# Patient Record
Sex: Female | Born: 1935 | Race: White | Hispanic: No | Smoking: Former smoker
Health system: Southern US, Community
[De-identification: ages and names within clinical notes are randomized; demographics above are authoritative.]

## PROBLEM LIST (undated history)

## (undated) DIAGNOSIS — K219 Gastro-esophageal reflux disease without esophagitis: Secondary | ICD-10-CM

## (undated) DIAGNOSIS — I10 Essential (primary) hypertension: Secondary | ICD-10-CM

## (undated) DIAGNOSIS — J449 Chronic obstructive pulmonary disease, unspecified: Secondary | ICD-10-CM

## (undated) HISTORY — PX: ESOPHAGOGASTRODUODENOSCOPY: SHX1529

## (undated) HISTORY — PX: COLONOSCOPY: SHX174

---

## 2016-05-14 ENCOUNTER — Encounter (HOSPITAL_COMMUNITY): Payer: Self-pay | Admitting: Emergency Medicine

## 2016-05-14 ENCOUNTER — Emergency Department (HOSPITAL_COMMUNITY)
Admission: EM | Admit: 2016-05-14 | Discharge: 2016-05-14 | Disposition: A | Discharge status: Home / Self Care | Payer: Medicare Other | Attending: Emergency Medicine | Admitting: Emergency Medicine

## 2016-05-14 ENCOUNTER — Emergency Department (HOSPITAL_COMMUNITY): Payer: Medicare Other

## 2016-05-14 DIAGNOSIS — I10 Essential (primary) hypertension: Secondary | ICD-10-CM | POA: Insufficient documentation

## 2016-05-14 DIAGNOSIS — S0012XA Contusion of left eyelid and periocular area, initial encounter: Secondary | ICD-10-CM | POA: Insufficient documentation

## 2016-05-14 DIAGNOSIS — Y9384 Activity, sleeping: Secondary | ICD-10-CM | POA: Diagnosis not present

## 2016-05-14 DIAGNOSIS — R51 Headache: Secondary | ICD-10-CM | POA: Diagnosis not present

## 2016-05-14 DIAGNOSIS — F17210 Nicotine dependence, cigarettes, uncomplicated: Secondary | ICD-10-CM | POA: Diagnosis not present

## 2016-05-14 DIAGNOSIS — H05232 Hemorrhage of left orbit: Secondary | ICD-10-CM

## 2016-05-14 DIAGNOSIS — Y9289 Other specified places as the place of occurrence of the external cause: Secondary | ICD-10-CM | POA: Diagnosis not present

## 2016-05-14 DIAGNOSIS — W228XXA Striking against or struck by other objects, initial encounter: Secondary | ICD-10-CM | POA: Diagnosis not present

## 2016-05-14 DIAGNOSIS — W1812XA Fall from or off toilet with subsequent striking against object, initial encounter: Secondary | ICD-10-CM

## 2016-05-14 DIAGNOSIS — Y999 Unspecified external cause status: Secondary | ICD-10-CM | POA: Insufficient documentation

## 2016-05-14 DIAGNOSIS — S0592XA Unspecified injury of left eye and orbit, initial encounter: Secondary | ICD-10-CM | POA: Diagnosis present

## 2016-05-14 LAB — CBC
HEMATOCRIT: 39 % (ref 36.0–46.0)
Hemoglobin: 13.1 g/dL (ref 12.0–15.0)
MCH: 30.5 pg (ref 26.0–34.0)
MCHC: 33.6 g/dL (ref 30.0–36.0)
MCV: 90.9 fL (ref 78.0–100.0)
PLATELETS: 344 10*3/uL (ref 150–400)
RBC: 4.29 MIL/uL (ref 3.87–5.11)
RDW: 12.9 % (ref 11.5–15.5)
WBC: 10.5 10*3/uL (ref 4.0–10.5)

## 2016-05-14 LAB — URINALYSIS, ROUTINE W REFLEX MICROSCOPIC
BILIRUBIN URINE: NEGATIVE
Glucose, UA: NEGATIVE mg/dL
Hgb urine dipstick: NEGATIVE
KETONES UR: NEGATIVE mg/dL
LEUKOCYTES UA: NEGATIVE
NITRITE: NEGATIVE
PH: 7 (ref 5.0–8.0)
PROTEIN: NEGATIVE mg/dL
Specific Gravity, Urine: 1.005 (ref 1.005–1.030)

## 2016-05-14 LAB — BASIC METABOLIC PANEL
Anion gap: 11 (ref 5–15)
BUN: 15 mg/dL (ref 6–20)
CALCIUM: 9.5 mg/dL (ref 8.9–10.3)
CO2: 28 mmol/L (ref 22–32)
CREATININE: 0.77 mg/dL (ref 0.44–1.00)
Chloride: 95 mmol/L — ABNORMAL LOW (ref 101–111)
GFR calc Af Amer: >60 mL/min (ref >60)
GLUCOSE: 95 mg/dL (ref 65–99)
POTASSIUM: 4.1 mmol/L (ref 3.5–5.1)
SODIUM: 134 mmol/L — AB (ref 135–145)

## 2016-05-14 MED ORDER — FENTANYL CITRATE (PF) 100 MCG/2ML IJ SOLN
25.0000 ug | Freq: Once | INTRAMUSCULAR | Status: AC
  Administered 2016-05-14: 25 ug via INTRAVENOUS
  Filled 2016-05-14: qty 2

## 2016-05-14 NOTE — ED Provider Notes (Signed)
[facility]PT Provider Note   CSN: [id number] Arrival date & time: 05/14/16  1212     History   Chief Complaint Chief Complaint  Patient presents with  . Fall  . Head Injury    HPI Amy Mckay is a 81 y.o. female.  HPI Patient with PMH of chronic back pain, peripheral vascular disease, HTN, HLD who presents for evaluation of head injury.  Patient states around 2 AM she accidentally fell asleep on the toilet and fell forward striking her head on a shelf.  She was able to get up and walk back to the bed and go to sleep.  She woke up this morning with a headache which prompted her visit.  She has a large bruise to her left eye. She denies any visual changes, numbness, weakness, gait abnormalities, or neck pain.  She denies any preceding dizziness, syncope, CP, or SOB.  She took her regular home medications, but has not taken anything for pain.  She endorses back pain, but this is a chronic issue for her and is unchanged today.   No past medical history on file.  There are no active problems to display for this patient.   No past surgical history on file.  OB History    No data available       Home Medications    Prior to Admission medications   Not on File    Family History No family history on file.  Social History Social History  Substance Use Topics  . Smoking status: Current Every Day Smoker    Packs/day: 1.50    Types: Cigarettes  . Smokeless tobacco: Never Used  . Alcohol use No     Allergies   Penicillins   Review of Systems Review of Systems All other systems negative unless otherwise stated in HPI   Physical Exam Updated Vital Signs BP 133/70 (BP Location: Right Arm)   Pulse 86   Temp 98.2 F (36.8 C) (Oral)   Resp 11   SpO2 96%   Physical Exam  Constitutional: She is oriented to person, place, and time. She appears well-developed and well-nourished.  Non-toxic appearance. She does not have a sickly appearance. She does not  appear ill.  HENT:  Head: Normocephalic.  Mouth/Throat: Oropharynx is clear and moist.  Golf ball sized hematoma to left superolateral orbit without underlying crepitus.  Eyes: Conjunctivae and EOM are normal. Pupils are equal, round, and reactive to light.  No hyphema.   Neck: Normal range of motion. Neck supple.  No cervical midline tenderness.   Cardiovascular: Normal rate and regular rhythm.   Pulmonary/Chest: Effort normal and breath sounds normal. No accessory muscle usage or stridor. No respiratory distress. She has no wheezes. She has no rhonchi. She has no rales.  Abdominal: Soft. Bowel sounds are normal. She exhibits no distension. There is no tenderness.  Musculoskeletal: Normal range of motion.  No T/L spine tenderness.   Lymphadenopathy:    She has no cervical adenopathy.  Neurological: She is alert and oriented to person, place, and time.  Speech clear without dysarthria. Cranial nerves grossly intact.  No pronator drift.  Normal finger to nose.  Normal strength and sensation throughout bilateral upper and lower extremities.  Normal gait.   Skin: Skin is warm and dry.  Psychiatric: She has a normal mood and affect. Her behavior is normal.     ED Treatments / Results  Labs (all labs ordered are listed, but only abnormal results are displayed) Labs Reviewed  BASIC METABOLIC PANEL - Abnormal; Notable for the following:       Result Value   Sodium 134 (*)    Chloride 95 (*)    All other components within normal limits  CBC  URINALYSIS, ROUTINE W REFLEX MICROSCOPIC [facility])    EKG  EKG Interpretation None       Radiology Ct Head Wo Contrast  Result Date: 05/14/2016 CLINICAL DATA:  Head injury on blood thinners.  Initial encounter. EXAM: CT HEAD WITHOUT CONTRAST TECHNIQUE: Contiguous axial images were obtained from the base of the skull through the vertex without intravenous contrast. COMPARISON:  None. FINDINGS: Brain: No evidence of acute infarction,  hemorrhage, hydrocephalus, extra-axial collection or mass lesion/mass effect. 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 Vascular: Atherosclerotic calcification.  No hyperdense vessel. Skull: Hematoma lateral to the left orbit.  No underlying fracture. Sinuses/Orbits: Bilateral cataract resection. No postseptal edema or hemorrhage. IMPRESSION: 1. No evidence of intracranial injury. 2. Left periorbital hematoma without fracture. Electronically Signed   By: Marnee SpringJonathon  Watts M.D.   On: 05/14/2016 13:04    Procedures Procedures (including critical care time)  Medications Ordered in ED Medications  fentaNYL (SUBLIMAZE) injection 25 mcg (25 mcg Intravenous Given 05/14/16 1350)     Initial Impression / Assessment and Plan / ED Course  I have reviewed the triage vital signs and the nursing notes.  Pertinent labs & imaging results that were available during my care of the patient were reviewed by me and considered in my medical decision making (see chart for details).  Clinical Course    Patient with left periorbital hematoma without underlying fracture.  No other signs of trauma.  No cervical spine tenderness.  She has no focal neurological deficits and able to ambulate in the ED without difficulty.  Labs obtained in triage without acute abnormalities.  Patient has pain medication at home.  Discussed ice and head elevation.  Follow up with PCP.  Return precautions discussed. All questions answered.  Stable for discharge.   Final Clinical Impressions(s) / ED Diagnoses   Final diagnoses:  Fall from or off toilet w strike against object, init  Periorbital hematoma of left eye    New Prescriptions New Prescriptions   No medications on file     Cheri FowlerKayla Dann Galicia, PA-C 05/14/16 1443    Jacalyn LefevreJulie Haviland, MD 05/14/16 1515

## 2016-05-14 NOTE — Discharge Instructions (Signed)
Ice your injury for 10-20 minutes three to four times daily to help with pain and swelling.  Continue taking your prescribed medications.  Follow up with your primary care physician in about a week.  Return to the ED for sudden worsening pain, confusion, numbness, weakness, or any new or concerning symptoms.

## 2016-05-14 NOTE — ED Triage Notes (Signed)
Pt from home with c/o a head injury s/p falling asleep while sitting on the toilet causing her to fall and hit her head.  Pt reports she is on blood thinners.  NAD, A&O.

## 2016-06-14 ENCOUNTER — Emergency Department (HOSPITAL_COMMUNITY)
Admission: EM | Admit: 2016-06-14 | Discharge: 2016-06-14 | Disposition: A | Discharge status: Home / Self Care | Payer: Medicare Other | Attending: Physician Assistant | Admitting: Physician Assistant

## 2016-06-14 ENCOUNTER — Emergency Department (HOSPITAL_COMMUNITY): Payer: Medicare Other

## 2016-06-14 ENCOUNTER — Encounter (HOSPITAL_COMMUNITY): Payer: Self-pay | Admitting: Emergency Medicine

## 2016-06-14 DIAGNOSIS — Y9351 Activity, roller skating (inline) and skateboarding: Secondary | ICD-10-CM | POA: Diagnosis not present

## 2016-06-14 DIAGNOSIS — Y92009 Unspecified place in unspecified non-institutional (private) residence as the place of occurrence of the external cause: Secondary | ICD-10-CM | POA: Insufficient documentation

## 2016-06-14 DIAGNOSIS — F17210 Nicotine dependence, cigarettes, uncomplicated: Secondary | ICD-10-CM | POA: Diagnosis not present

## 2016-06-14 DIAGNOSIS — Y999 Unspecified external cause status: Secondary | ICD-10-CM | POA: Diagnosis not present

## 2016-06-14 DIAGNOSIS — T1490XA Injury, unspecified, initial encounter: Secondary | ICD-10-CM

## 2016-06-14 DIAGNOSIS — M545 Low back pain: Secondary | ICD-10-CM | POA: Insufficient documentation

## 2016-06-14 DIAGNOSIS — S0101XA Laceration without foreign body of scalp, initial encounter: Secondary | ICD-10-CM | POA: Insufficient documentation

## 2016-06-14 DIAGNOSIS — S0990XA Unspecified injury of head, initial encounter: Secondary | ICD-10-CM | POA: Diagnosis present

## 2016-06-14 DIAGNOSIS — Z7982 Long term (current) use of aspirin: Secondary | ICD-10-CM | POA: Diagnosis not present

## 2016-06-14 DIAGNOSIS — W19XXXA Unspecified fall, initial encounter: Secondary | ICD-10-CM

## 2016-06-14 LAB — CBC WITH DIFFERENTIAL/PLATELET
Basophils Absolute: 0 10*3/uL (ref 0.0–0.1)
Basophils Relative: 0 %
EOS ABS: 0.2 10*3/uL (ref 0.0–0.7)
EOS PCT: 2 %
HCT: 40.6 % (ref 36.0–46.0)
HEMOGLOBIN: 13.9 g/dL (ref 12.0–15.0)
LYMPHS ABS: 3.7 10*3/uL (ref 0.7–4.0)
Lymphocytes Relative: 31 %
MCH: 30.3 pg (ref 26.0–34.0)
MCHC: 34.2 g/dL (ref 30.0–36.0)
MCV: 88.5 fL (ref 78.0–100.0)
MONOS PCT: 9 %
Monocytes Absolute: 1.1 10*3/uL — ABNORMAL HIGH (ref 0.1–1.0)
Neutro Abs: 6.9 10*3/uL (ref 1.7–7.7)
Neutrophils Relative %: 58 %
PLATELETS: 380 10*3/uL (ref 150–400)
RBC: 4.59 MIL/uL (ref 3.87–5.11)
RDW: 13 % (ref 11.5–15.5)
WBC: 11.9 10*3/uL — ABNORMAL HIGH (ref 4.0–10.5)

## 2016-06-14 LAB — BASIC METABOLIC PANEL
Anion gap: 12 (ref 5–15)
BUN: 13 mg/dL (ref 6–20)
CHLORIDE: 98 mmol/L — AB (ref 101–111)
CO2: 23 mmol/L (ref 22–32)
CREATININE: 0.8 mg/dL (ref 0.44–1.00)
Calcium: 9.4 mg/dL (ref 8.9–10.3)
GFR calc Af Amer: >60 mL/min (ref >60)
GFR calc non Af Amer: >60 mL/min (ref >60)
GLUCOSE: 100 mg/dL — AB (ref 65–99)
Potassium: 3.6 mmol/L (ref 3.5–5.1)
SODIUM: 133 mmol/L — AB (ref 135–145)

## 2016-06-14 MED ORDER — MORPHINE SULFATE (PF) 4 MG/ML IV SOLN
4.0000 mg | Freq: Once | INTRAVENOUS | Status: AC
  Administered 2016-06-14: 4 mg via INTRAVENOUS
  Filled 2016-06-14: qty 1

## 2016-06-14 MED ORDER — FENTANYL CITRATE (PF) 100 MCG/2ML IJ SOLN
25.0000 ug | Freq: Once | INTRAMUSCULAR | Status: AC
  Administered 2016-06-14: 25 ug via INTRAVENOUS
  Filled 2016-06-14: qty 2

## 2016-06-14 MED ORDER — HYDROCODONE-ACETAMINOPHEN 5-325 MG PO TABS
1.0000 | ORAL_TABLET | Freq: Once | ORAL | Status: AC
  Administered 2016-06-14: 1 via ORAL
  Filled 2016-06-14: qty 1

## 2016-06-14 MED ORDER — LIDOCAINE HCL (PF) 1 % IJ SOLN
5.0000 mL | Freq: Once | INTRAMUSCULAR | Status: AC
Start: 2016-06-14 — End: 2016-06-14
  Administered 2016-06-14: 5 mL via INTRADERMAL
  Filled 2016-06-14: qty 5

## 2016-06-14 NOTE — ED Notes (Signed)
C collar placed on pt by Ronaldo MiyamotoKyle, emt. Pt rolled and backboard removed by Dr. Juliann ParesMackeun

## 2016-06-14 NOTE — ED Triage Notes (Signed)
Per [facility], pt from home, was on a hoverboard fell backwards onto back of head straight onto hardwood floor, pt states she heard a crack. Pt denies LOC. No neuro deficits. Pt on plavix states she has two stents in her abdomen. Right occipital injury. Pt in NAD. Denies neck pain. Pt arrived on back board.

## 2016-06-14 NOTE — Progress Notes (Signed)
PARTIAL DENTAL WORK, HOUSE KEY, HAIR PINS, MONEY AND PAPERWORK REMOVED FROM PATIENT POCKETS AND HEAD, THEN PLACED IN TWO DENTURE CUPS FOR SAFE-KEEPING PRIOR TO SCAN. PATIENT'S HEAD BANDAGE RE-WRAPPED WITH GAUZE AND COBAN DUE TO ORIGINAL GAUZE NOT STILL IN PLACE UPON PATIENT ARRIVAL. (JDR)

## 2016-06-14 NOTE — ED Notes (Signed)
Pt returned from CT °

## 2016-06-14 NOTE — Discharge Instructions (Signed)
Please return to your doctor, urgent care, or this department in 5-7 days for staple removal.

## 2016-06-14 NOTE — ED Notes (Signed)
Pt transported to CT ?

## 2016-06-14 NOTE — ED Triage Notes (Signed)
196/108 BP by ems, pulse 112. spo2 96%, pt given 50 of fentanyl per ems.

## 2016-06-14 NOTE — ED Notes (Signed)
Dressing applied to head.

## 2016-06-14 NOTE — ED Provider Notes (Signed)
[facility] Provider Note   CSN: [id number] Arrival date & time: 06/14/16  1538     History   Chief Complaint Chief Complaint  Patient presents with  . Fall  . Head Injury    HPI Amy Mckay is a 81 y.o. female.  The history is provided by the patient and the EMS personnel. No language interpreter was used.  Fall  This is a new problem. The current episode started less than 1 hour ago. Associated symptoms include headaches. Pertinent negatives include no abdominal pain and no shortness of breath.  Head Injury   The incident occurred less than 1 hour ago. She came to the ER via EMS. The injury mechanism was a fall. There was no loss of consciousness. The volume of blood lost was minimal. The pain is moderate. Pertinent negatives include no numbness, no blurred vision, no vomiting, no disorientation and no weakness. She was found conscious by EMS personnel. Treatment on the scene included a backboard and a c-collar. She has tried prescription drugs for the symptoms.     81 year old female with past medical history of abdominal vascular stents currently on Plavix who presents today with fall immediately prior to arrival. States she was on a hover board when she slipped and fell backwards hitting the back of her head. States she heard a crack sound. Endorses pain in the back of her head and in her lower back. Denies LOC. Laid there until EMS arrived and has not tried to ambulate. EMS notes she had a laceration with bleeding to the back of her head. Bleeding has been controlled. Denies any new numbness/tingling, focal weakness. Denies being on anticoagulation.   History reviewed. No pertinent past medical history.  There are no active problems to display for this patient.   History reviewed. No pertinent surgical history.  OB History    No data available       Home Medications    Prior to Admission medications   Medication Sig Start Date End Date Taking? Authorizing  Provider  albuterol (PROVENTIL HFA;VENTOLIN HFA) 108 (90 Base) MCG/ACT inhaler Inhale 2 puffs into the lungs daily as needed for shortness of breath. 06/27/15  Yes Historical Provider, MD  amLODipine (NORVASC) 10 MG tablet Take 10 mg by mouth daily. 05/27/16  Yes Historical Provider, MD  aspirin EC 81 MG tablet Take 81 mg by mouth daily.   Yes Historical Provider, MD  calcium carbonate (CALCIUM 600) 600 MG TABS tablet Take 600 mg by mouth daily.   Yes Historical Provider, MD  clopidogrel (PLAVIX) 75 MG tablet Take 75 mg by mouth daily. 06/04/16  Yes Historical Provider, MD  cyclobenzaprine (FLEXERIL) 5 MG tablet Take 5 mg by mouth 3 (three) times daily as needed. 04/25/16  Yes Historical Provider, MD  fluticasone (FLONASE) 50 MCG/ACT nasal spray Place 1 spray into both nostrils daily. 05/14/16  Yes Historical Provider, MD  gabapentin (NEURONTIN) 800 MG tablet Take 800 mg by mouth 3 (three) times daily. 04/25/16  Yes Historical Provider, MD  levothyroxine (SYNTHROID, LEVOTHROID) 75 MCG tablet Take 75 mcg by mouth daily before breakfast. 02/05/16  Yes Historical Provider, MD  metoCLOPramide (REGLAN) 5 MG tablet Take 5 mg by mouth daily as needed for nausea. 04/18/14  Yes Historical Provider, MD  Oxycodone HCl 10 MG TABS Take 10 mg by mouth 4 (four) times daily as needed for pain. 06/10/16 07/10/16 Yes Historical Provider, MD  pantoprazole (PROTONIX) 40 MG tablet Take 40 mg by mouth daily. 01/23/16  Yes  Historical Provider, MD  VESICARE 5 MG tablet Take 5 mg by mouth daily. 04/25/16  Yes Historical Provider, MD  Vitamin D, Cholecalciferol, 400 units TABS Take 400 Units by mouth daily.    Yes Historical Provider, MD    Family History No family history on file.  Social History Social History  Substance Use Topics  . Smoking status: Current Every Day Smoker    Packs/day: 1.50    Types: Cigarettes  . Smokeless tobacco: Never Used  . Alcohol use No     Allergies   Penicillins   Review of  Systems Review of Systems  HENT: Negative for facial swelling and nosebleeds.   Eyes: Negative for blurred vision.  Respiratory: Negative for shortness of breath.   Gastrointestinal: Negative for abdominal pain, nausea and vomiting.  Genitourinary: Negative for dysuria and hematuria.  Skin: Positive for wound (back of head).  Neurological: Positive for headaches. Negative for weakness and numbness.  Psychiatric/Behavioral: Negative for agitation and confusion.  All other systems reviewed and are negative.    Physical Exam Updated Vital Signs BP 108/74   Pulse 100   Temp 98.8 F (37.1 C) (Oral)   Resp 16   SpO2 100%   Physical Exam  Constitutional: She is oriented to person, place, and time. No distress. Cervical collar and backboard in place.  Elderly appearing  HENT:  Head: Head is with laceration (posterior scalp).  Laceration to posterior scalp with palpable deformity. Laceration is approximately 1 cm with underlying hematoma. Oozing of blood noted from laceration.  Eyes: Conjunctivae and EOM are normal. Pupils are equal, round, and reactive to light.  Neck: Normal range of motion. Neck supple.  No midline C spine ttp  Cardiovascular: Normal rate and regular rhythm.   Pulmonary/Chest: Effort normal and breath sounds normal. No respiratory distress. She has no wheezes.  Abdominal: Soft. She exhibits no distension. There is no tenderness.  Musculoskeletal:  Tenderness to lower lumbar spine  Neurological: She is alert and oriented to person, place, and time.  Skin: Skin is warm. She is not diaphoretic.  Nursing note and vitals reviewed.    ED Treatments / Results  Labs (all labs ordered are listed, but only abnormal results are displayed) Labs Reviewed  CBC WITH DIFFERENTIAL/PLATELET - Abnormal; Notable for the following:       Result Value   WBC 11.9 (*)    Monocytes Absolute 1.1 (*)    All other components within normal limits  BASIC METABOLIC PANEL - Abnormal;  Notable for the following:    Sodium 133 (*)    Chloride 98 (*)    Glucose, Bld 100 (*)    All other components within normal limits    EKG  EKG Interpretation None       Radiology Ct Head Wo Contrast  Result Date: 06/14/2016 CLINICAL DATA:  Patient status post fall from however board. No reported loss of consciousness. EXAM: CT HEAD WITHOUT CONTRAST CT CERVICAL SPINE WITHOUT CONTRAST TECHNIQUE: Multidetector CT imaging of the head and cervical spine was performed following the standard protocol without intravenous contrast. Multiplanar CT image reconstructions of the cervical spine were also generated. COMPARISON:  None. FINDINGS: CT HEAD FINDINGS Brain: No evidence of acute infarction, hemorrhage, hydrocephalus, extra-axial collection or mass lesion/mass effect. Suggest chronic right thalamic infarct. Vascular: Atherosclerotic disease. Skull: Soft tissue laceration/hematoma overlying the dorsal calvarium. No evidence for underlying skull fracture. Sinuses/Orbits: Paranasal sinuses are unremarkable. Mastoid air cells are well aerated. Orbits are unremarkable. None. Other: None.  CT CERVICAL SPINE FINDINGS Alignment: Straightening of the normal cervical lordosis. Mild grade 1 anterolisthesis of C3 on C4 and C4 on C5, likely secondary to associated facet degenerative changes. Skull base and vertebrae: Intact. Soft tissues and spinal canal: No prevertebral fluid or swelling. No visible canal hematoma. Disc levels:  Degenerative disc disease most pronounced C6-7. Upper chest: Unremarkable. Other: None. IMPRESSION: Soft tissue laceration overlying the dorsal calvarium. No evidence for underlying skull fracture. No acute intracranial process. No acute cervical spine fracture. Electronically Signed   By: Annia Beltrew  Davis M.D.   On: 06/14/2016 17:18   Ct Cervical Spine Wo Contrast  Result Date: 06/14/2016 CLINICAL DATA:  Patient status post fall from however board. No reported loss of consciousness. EXAM:  CT HEAD WITHOUT CONTRAST CT CERVICAL SPINE WITHOUT CONTRAST TECHNIQUE: Multidetector CT imaging of the head and cervical spine was performed following the standard protocol without intravenous contrast. Multiplanar CT image reconstructions of the cervical spine were also generated. COMPARISON:  None. FINDINGS: CT HEAD FINDINGS Brain: No evidence of acute infarction, hemorrhage, hydrocephalus, extra-axial collection or mass lesion/mass effect. Suggest chronic right thalamic infarct. Vascular: Atherosclerotic disease. Skull: Soft tissue laceration/hematoma overlying the dorsal calvarium. No evidence for underlying skull fracture. Sinuses/Orbits: Paranasal sinuses are unremarkable. Mastoid air cells are well aerated. Orbits are unremarkable. None. Other: None. CT CERVICAL SPINE FINDINGS Alignment: Straightening of the normal cervical lordosis. Mild grade 1 anterolisthesis of C3 on C4 and C4 on C5, likely secondary to associated facet degenerative changes. Skull base and vertebrae: Intact. Soft tissues and spinal canal: No prevertebral fluid or swelling. No visible canal hematoma. Disc levels:  Degenerative disc disease most pronounced C6-7. Upper chest: Unremarkable. Other: None. IMPRESSION: Soft tissue laceration overlying the dorsal calvarium. No evidence for underlying skull fracture. No acute intracranial process. No acute cervical spine fracture. Electronically Signed   By: Annia Beltrew  Davis M.D.   On: 06/14/2016 17:18   Ct Thoracic Spine Wo Contrast  Result Date: 06/14/2016 CLINICAL DATA:  Fall backwards today.  Back pain. EXAM: CT THORACIC AND LUMBAR SPINE WITHOUT CONTRAST TECHNIQUE: Multidetector CT imaging of the thoracic and lumbar spine was performed without contrast. Multiplanar CT image reconstructions were also generated. COMPARISON:  None. FINDINGS: CT THORACIC SPINE FINDINGS Alignment: Mild rightward scoliosis in the upper thoracic spine. Leftward scoliosis at the thoracolumbar junction. Vertebrae: No  acute fracture or focal pathologic process. Paraspinal and other soft tissues: Negative. No confluent airspace opacity in the visualized lungs. Aortic calcifications. No aneurysm. Disc levels: Degenerative disc disease changes in the upper lumbar spine. CT LUMBAR SPINE FINDINGS Segmentation: 5 lumbar type vertebrae. Alignment: Mild leftward scoliosis in the upper lumbar spine. Vertebrae: No acute fracture or focal pathologic process. Paraspinal and other soft tissues: Negative. Heavily calcified aorta, branch vessels and iliac vessels. No aneurysm. Disc levels: Severe degenerative disc disease and facet disease throughout the lumbar spine. IMPRESSION: CT THORACIC SPINE IMPRESSION Rightward scoliosis.  No acute bony abnormality. CT LUMBAR SPINE IMPRESSION Leftward scoliosis. Advanced degenerative disc and facet disease. No acute bony abnormality. Aortic atherosclerosis. Electronically Signed   By: Charlett NoseKevin  Dover M.D.   On: 06/14/2016 17:12   Ct Lumbar Spine Wo Contrast  Result Date: 06/14/2016 CLINICAL DATA:  Fall backwards today.  Back pain. EXAM: CT THORACIC AND LUMBAR SPINE WITHOUT CONTRAST TECHNIQUE: Multidetector CT imaging of the thoracic and lumbar spine was performed without contrast. Multiplanar CT image reconstructions were also generated. COMPARISON:  None. FINDINGS: CT THORACIC SPINE FINDINGS Alignment: Mild rightward scoliosis in the upper  thoracic spine. Leftward scoliosis at the thoracolumbar junction. Vertebrae: No acute fracture or focal pathologic process. Paraspinal and other soft tissues: Negative. No confluent airspace opacity in the visualized lungs. Aortic calcifications. No aneurysm. Disc levels: Degenerative disc disease changes in the upper lumbar spine. CT LUMBAR SPINE FINDINGS Segmentation: 5 lumbar type vertebrae. Alignment: Mild leftward scoliosis in the upper lumbar spine. Vertebrae: No acute fracture or focal pathologic process. Paraspinal and other soft tissues: Negative. Heavily  calcified aorta, branch vessels and iliac vessels. No aneurysm. Disc levels: Severe degenerative disc disease and facet disease throughout the lumbar spine. IMPRESSION: CT THORACIC SPINE IMPRESSION Rightward scoliosis.  No acute bony abnormality. CT LUMBAR SPINE IMPRESSION Leftward scoliosis. Advanced degenerative disc and facet disease. No acute bony abnormality. Aortic atherosclerosis. Electronically Signed   By: Charlett Nose M.D.   On: 06/14/2016 17:12    Procedures .Marland KitchenLaceration Repair Date/Time: 06/14/2016 7:12 PM Performed by: Madolyn Frieze Authorized by: Bary Castilla LYN   Consent:    Consent obtained:  Verbal   Consent given by:  Patient   Risks discussed:  Pain   Alternatives discussed:  No treatment Anesthesia (see MAR for exact dosages):    Anesthesia method:  Local infiltration   Local anesthetic:  Lidocaine 1% w/o epi Laceration details:    Location:  Scalp   Scalp location:  Occipital   Length (cm):  1 Pre-procedure details:    Preparation:  Imaging obtained to evaluate for foreign bodies (CT head to evaluate for underlying fractures) Exploration:    Hemostasis achieved with:  Direct pressure   Wound extent: no foreign bodies/material noted and no vascular damage noted     Contaminated: no   Treatment:    Area cleansed with:  Saline   Amount of cleaning:  Standard   Irrigation solution:  Sterile saline   Irrigation volume:  180   Irrigation method:  Syringe and pressure wash   Visualized foreign bodies/material removed: no   Skin repair:    Repair method:  Staples   Number of staples:  3 Post-procedure details:    Dressing:  Open (no dressing)   Patient tolerance of procedure:  Tolerated well, no immediate complications   (including critical care time)  Medications Ordered in ED Medications  fentaNYL (SUBLIMAZE) injection 25 mcg (25 mcg Intravenous Given 06/14/16 1556)  fentaNYL (SUBLIMAZE) injection 25 mcg (25 mcg Intravenous Given 06/14/16 1717)    lidocaine (PF) (XYLOCAINE) 1 % injection 5 mL (5 mLs Intradermal Given by Other 06/14/16 1749)  morphine 4 MG/ML injection 4 mg (4 mg Intravenous Given 06/14/16 1749)  HYDROcodone-acetaminophen (NORCO/VICODIN) 5-325 MG per tablet 1 tablet (1 tablet Oral Given 06/14/16 1909)     Initial Impression / Assessment and Plan / ED Course  I have reviewed the triage vital signs and the nursing notes.  Pertinent labs & imaging results that were available during my care of the patient were reviewed by me and considered in my medical decision making (see chart for details).  Clinical Course     81 year old female presents today status post fall. Patient is awake and alert and in no acute distress. Her mental status is normal. She has no focal neuro deficits. Obtain CT of her head, C-spine, thoracic, lumbar regions. No acute findings are seen. No skull fractures were seen on radiology review. Her head laceration was repaired as noted above. Bleeding was well controlled after this. She is able to ambulate without difficulty on her own following repair. We'll discharge the patient home  with plan to get her staples removed in 5-7 days. Patient is agreeable with this. She has pain medication at home that she states she will take. I advised her that we did not want to give more than what she takes at home in order to prevent her from having future falls. Patient agreeable and discharged home in good condition.  Final Clinical Impressions(s) / ED Diagnoses   Final diagnoses:  Trauma  Injury of head, initial encounter  Fall in home, initial encounter  Laceration of scalp without foreign body, initial encounter    New Prescriptions New Prescriptions   No medications on file     Madolyn Frieze, MD 06/14/16 1914    Courteney Randall An, MD 06/16/16 1611

## 2016-06-23 ENCOUNTER — Ambulatory Visit (HOSPITAL_COMMUNITY)
Admission: EM | Admit: 2016-06-23 | Discharge: 2016-06-23 | Disposition: A | Discharge status: Home / Self Care | Payer: Medicare Other | Attending: Family Medicine | Admitting: Family Medicine

## 2016-06-23 ENCOUNTER — Encounter (HOSPITAL_COMMUNITY): Payer: Self-pay | Admitting: Family Medicine

## 2016-06-23 DIAGNOSIS — Z4802 Encounter for removal of sutures: Secondary | ICD-10-CM | POA: Diagnosis not present

## 2016-06-23 NOTE — ED Provider Notes (Signed)
[facility]    CSN: [id number] Arrival date & time: 06/23/16  1846     History   Chief Complaint Chief Complaint  Patient presents with  . Suture / Staple Removal    HPI Amy Mckay is a 82 y.o. female.   The history is provided by the patient.  Suture / Staple Removal  This is a new problem. Episode onset: pt had 3 staples in scalp on 12/31, here for removal. The problem has not changed since onset.   No past medical history on file.  There are no active problems to display for this patient.   History reviewed. No pertinent surgical history.  OB History    No data available       Home Medications    Prior to Admission medications   Medication Sig Start Date End Date Taking? Authorizing Provider  albuterol (PROVENTIL HFA;VENTOLIN HFA) 108 (90 Base) MCG/ACT inhaler Inhale 2 puffs into the lungs daily as needed for shortness of breath. 06/27/15   Historical Provider, MD  amLODipine (NORVASC) 10 MG tablet Take 10 mg by mouth daily. 05/27/16   Historical Provider, MD  aspirin EC 81 MG tablet Take 81 mg by mouth daily.    Historical Provider, MD  calcium carbonate (CALCIUM 600) 600 MG TABS tablet Take 600 mg by mouth daily.    Historical Provider, MD  clopidogrel (PLAVIX) 75 MG tablet Take 75 mg by mouth daily. 06/04/16   Historical Provider, MD  cyclobenzaprine (FLEXERIL) 5 MG tablet Take 5 mg by mouth 3 (three) times daily as needed. 04/25/16   Historical Provider, MD  fluticasone (FLONASE) 50 MCG/ACT nasal spray Place 1 spray into both nostrils daily. 05/14/16   Historical Provider, MD  gabapentin (NEURONTIN) 800 MG tablet Take 800 mg by mouth 3 (three) times daily. 04/25/16   Historical Provider, MD  levothyroxine (SYNTHROID, LEVOTHROID) 75 MCG tablet Take 75 mcg by mouth daily before breakfast. 02/05/16   Historical Provider, MD  metoCLOPramide (REGLAN) 5 MG tablet Take 5 mg by mouth daily as needed for nausea. 04/18/14   Historical Provider, MD  Oxycodone  HCl 10 MG TABS Take 10 mg by mouth 4 (four) times daily as needed for pain. 06/10/16 07/10/16  Historical Provider, MD  pantoprazole (PROTONIX) 40 MG tablet Take 40 mg by mouth daily. 01/23/16   Historical Provider, MD  VESICARE 5 MG tablet Take 5 mg by mouth daily. 04/25/16   Historical Provider, MD  Vitamin D, Cholecalciferol, 400 units TABS Take 400 Units by mouth daily.     Historical Provider, MD    Family History History reviewed. No pertinent family history.  Social History Social History  Substance Use Topics  . Smoking status: Current Every Day Smoker    Packs/day: 1.50    Types: Cigarettes  . Smokeless tobacco: Never Used  . Alcohol use No     Allergies   Penicillins   Review of Systems Review of Systems  Skin: Positive for wound.  All other systems reviewed and are negative.    Physical Exam Triage Vital Signs ED Triage Vitals [06/23/16 2047]  Enc Vitals Group     BP 110/75     Pulse Rate 104     Resp 18     Temp 97.8 F (36.6 C)     Temp src      SpO2 100 %     Weight      Height      Head Circumference  Peak Flow      Pain Score      Pain Loc      Pain Edu?      Excl. in GC?    No data found.   Updated Vital Signs BP 110/75   Pulse 104   Temp 97.8 F (36.6 C)   Resp 18   SpO2 100%   Visual Acuity Right Eye Distance:   Left Eye Distance:   Bilateral Distance:    Right Eye Near:   Left Eye Near:    Bilateral Near:     Physical Exam  Constitutional: She appears well-developed and well-nourished. No distress.  Skin: Skin is warm and dry.  3 scalp staples removed.     UC Treatments / Results  Labs (all labs ordered are listed, but only abnormal results are displayed) Labs Reviewed - No data to display  EKG  EKG Interpretation None       Radiology No results found.  Procedures Procedures (including critical care time)  Medications Ordered in UC Medications - No data to display   Initial Impression /  Assessment and Plan / UC Course  I have reviewed the triage vital signs and the nursing notes.  Pertinent labs & imaging results that were available during my care of the patient were reviewed by me and considered in my medical decision making (see chart for details).  Clinical Course       Final Clinical Impressions(s) / UC Diagnoses   Final diagnoses:  None    New Prescriptions New Prescriptions   No medications on file     Linna HoffJames D Saharsh Sterling, MD 07/12/16 1308

## 2016-06-23 NOTE — Discharge Instructions (Signed)
Return as needed

## 2016-06-23 NOTE — ED Triage Notes (Signed)
Pt here for suture removal to head.

## 2018-07-28 IMAGING — CT CT HEAD W/O CM
4 series · 19 of 47 positions shown, 21 images · non-contrast
Comparison: None.

CLINICAL DATA: Head injury on blood thinners.  Initial encounter.

EXAM:
CT HEAD WITHOUT CONTRAST
TECHNIQUE: Contiguous axial images were obtained from the base of the skull
through the vertex without intravenous contrast.

[Series 201: head w/o, idose (1) · axial · non-contrast · 0.44mm/px · z∈[+59,+169]mm · 8 of 30 slices shown, 10 images]
[im 4/30  brain]
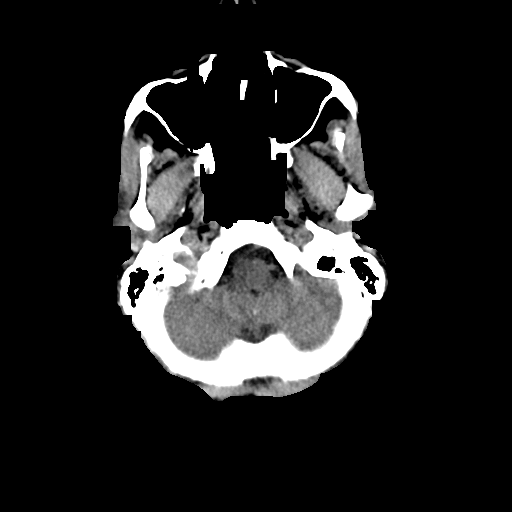
[im 4/30  bone]
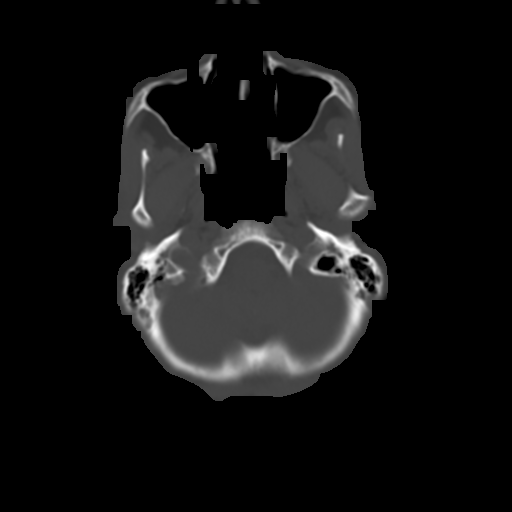
[im 7/30  brain]
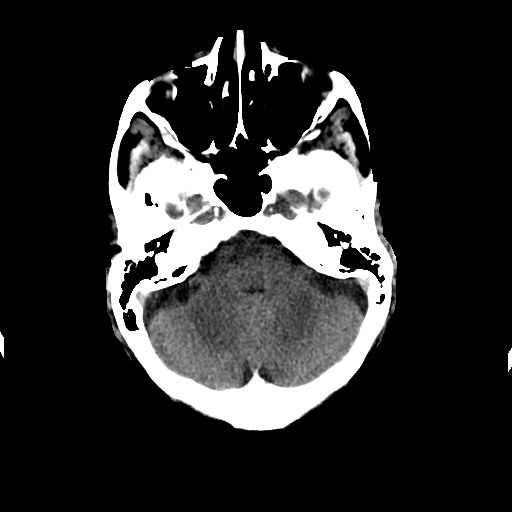
[im 10/30  brain]
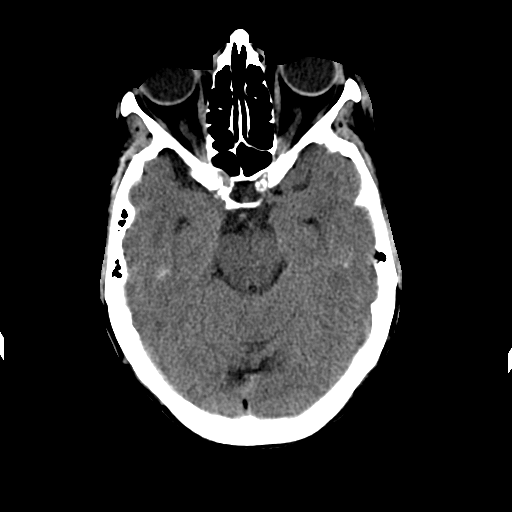
[im 13/30  brain]
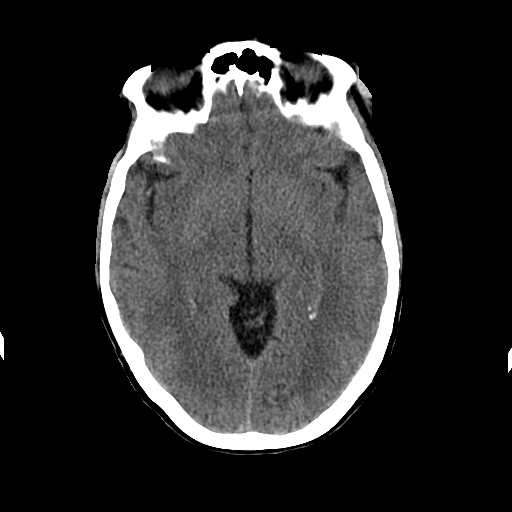
[im 17/30  brain]
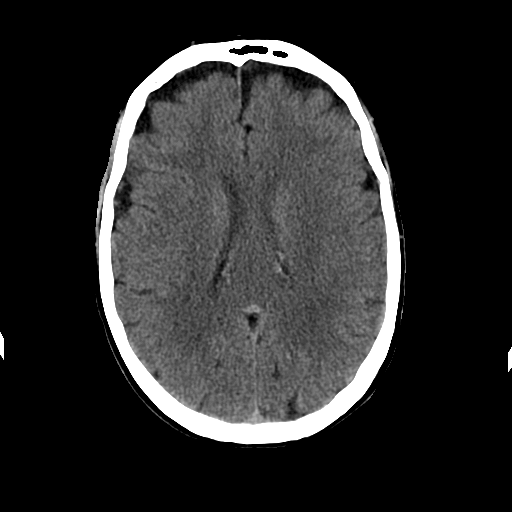
[im 17/30  bone]
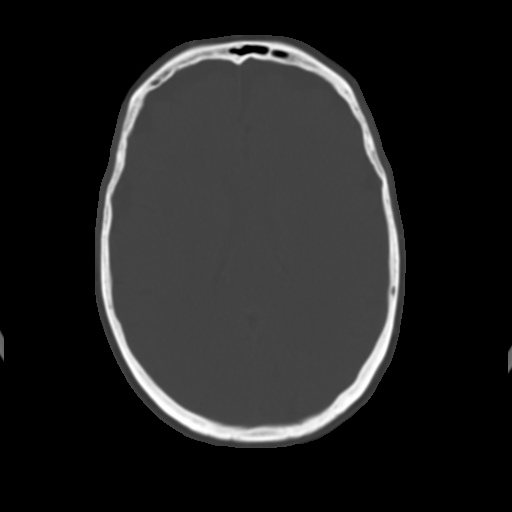
[im 20/30  brain]
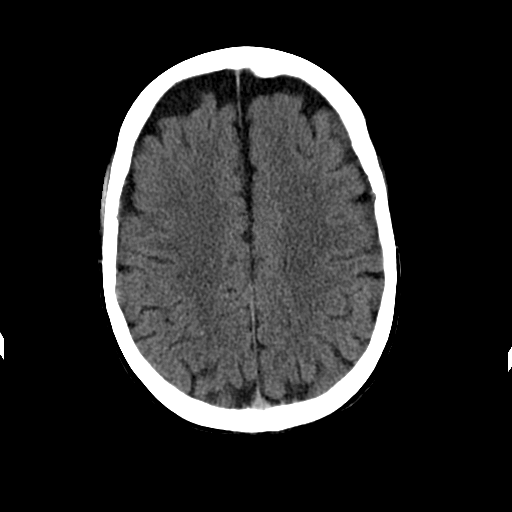
[im 23/30  brain]
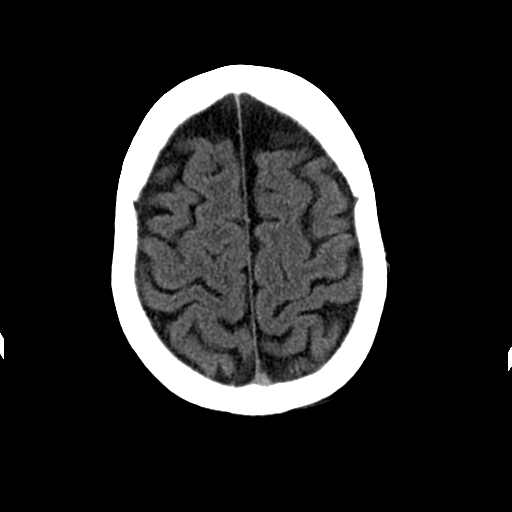
[im 26/30  brain]
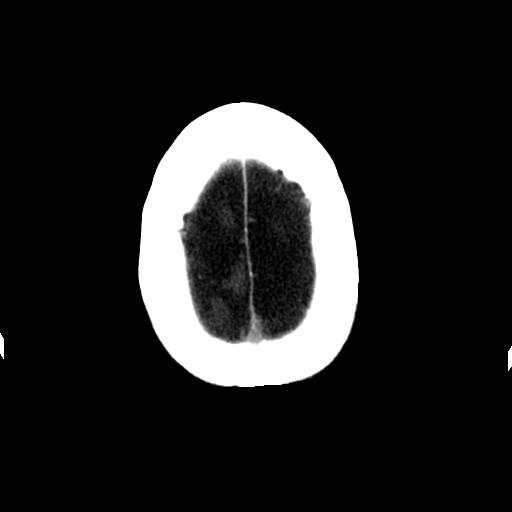

[Series 202: head w/o bone, idose (1) · axial · non-contrast · 0.44mm/px · z∈[+58,+128]mm · 5 of 60 slices shown]
[im 7/60  bone]
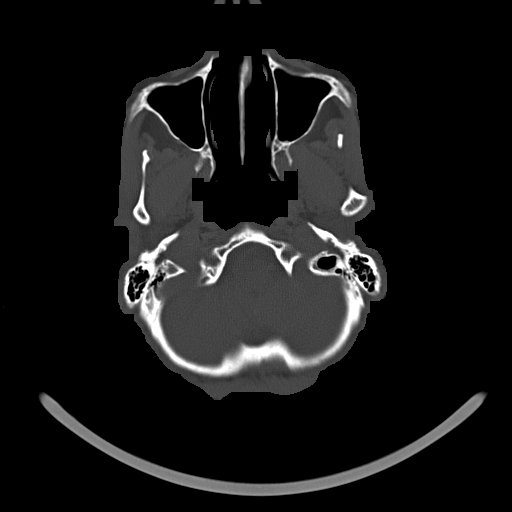
[im 13/60  bone]
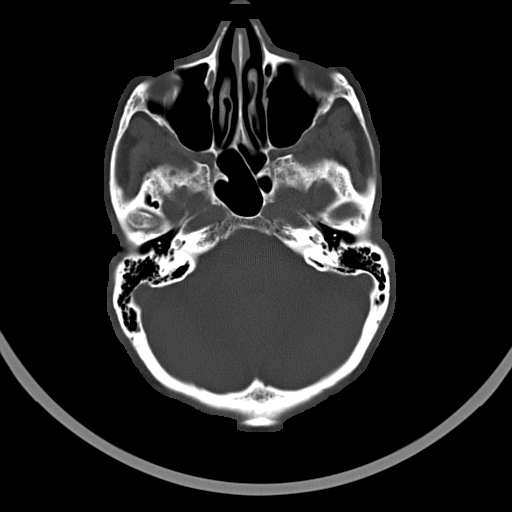
[im 19/60  bone]
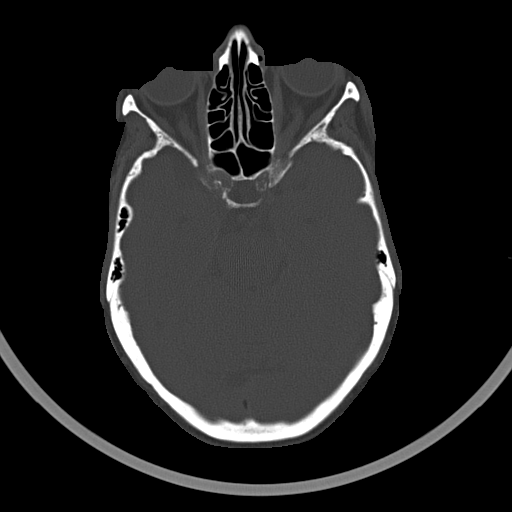
[im 25/60  bone]
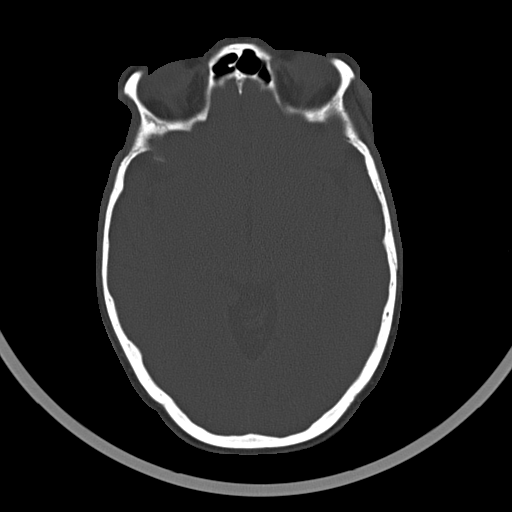
[im 35/60  bone]
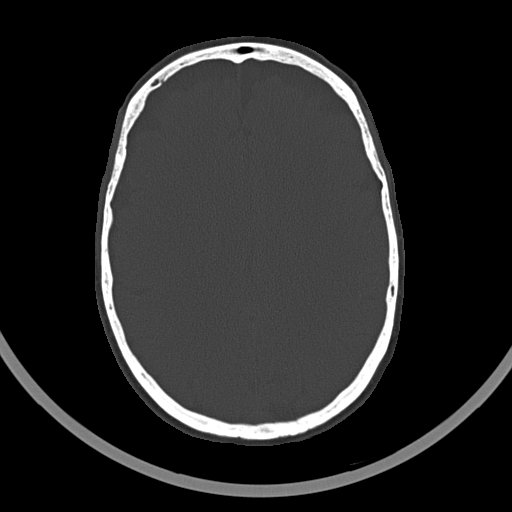

[Series 203: coronal st, idose (1) · coronal · 0.40mm/px · 3 of 72 slices shown]
[im 24/72  brain]
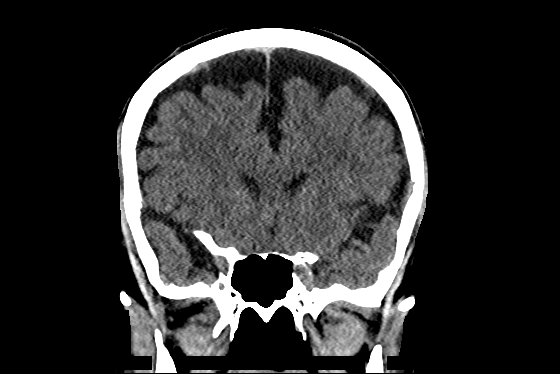
[im 32/72  brain]
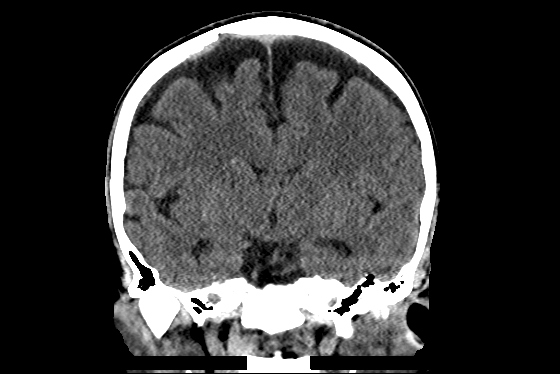
[im 40/72  brain]
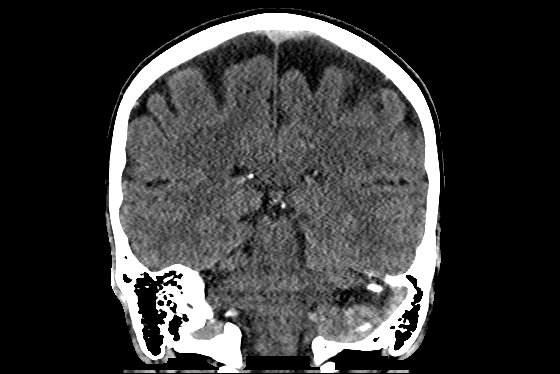

[Series 204: sagittal st, idose (1) · sagittal · 0.40mm/px · 3 of 72 slices shown]
[im 24/72  brain]
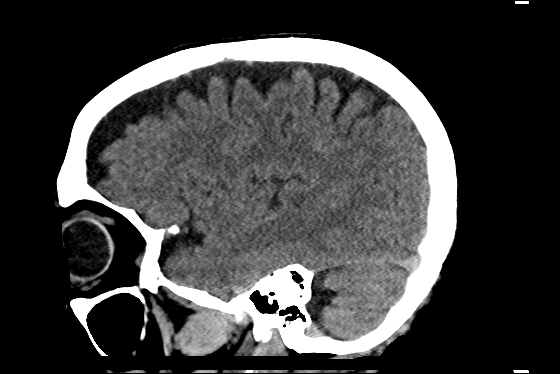
[im 36/72  brain]
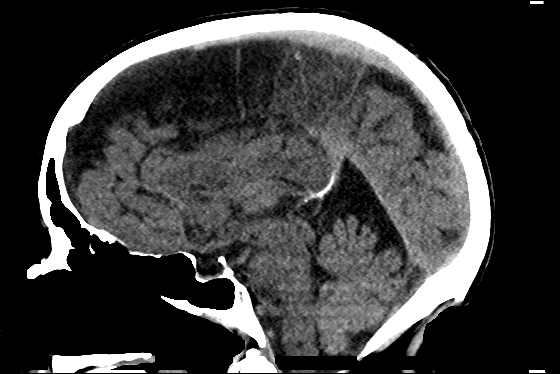
[im 48/72  brain]
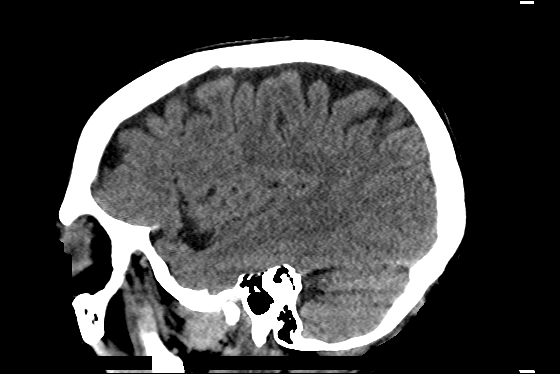

[19 of 47 positions shown; findings below may reference images not displayed]

FINDINGS: Brain: No evidence of acute infarction, hemorrhage, hydrocephalus,
extra-axial collection or mass lesion/mass effect. Suspect previous
lacunar infarct in the right thalamus, history suggesting
chronicity.

Vascular: Atherosclerotic calcification.  No hyperdense vessel.

Skull: Hematoma lateral to the left orbit.  No underlying fracture.

Sinuses/Orbits: Bilateral cataract resection. No postseptal edema or
hemorrhage.
IMPRESSION: 1. No evidence of intracranial injury.
2. Left periorbital hematoma without fracture.

## 2021-01-29 ENCOUNTER — Other Ambulatory Visit: Payer: Self-pay

## 2021-01-29 ENCOUNTER — Encounter (HOSPITAL_COMMUNITY): Payer: Self-pay

## 2021-01-29 ENCOUNTER — Inpatient Hospital Stay (HOSPITAL_COMMUNITY)
Admission: EM | Admit: 2021-01-29 | Discharge: 2021-02-02 | DRG: 385 | Disposition: A | Discharge status: Home / Self Care | Payer: Medicare Other | Attending: Family Medicine | Admitting: Family Medicine

## 2021-01-29 ENCOUNTER — Emergency Department (HOSPITAL_COMMUNITY): Payer: Medicare Other

## 2021-01-29 DIAGNOSIS — U071 COVID-19: Secondary | ICD-10-CM | POA: Diagnosis present

## 2021-01-29 DIAGNOSIS — F17210 Nicotine dependence, cigarettes, uncomplicated: Secondary | ICD-10-CM | POA: Diagnosis present

## 2021-01-29 DIAGNOSIS — K5150 Left sided colitis without complications: Principal | ICD-10-CM | POA: Diagnosis present

## 2021-01-29 DIAGNOSIS — N179 Acute kidney failure, unspecified: Secondary | ICD-10-CM | POA: Diagnosis present

## 2021-01-29 DIAGNOSIS — K219 Gastro-esophageal reflux disease without esophagitis: Secondary | ICD-10-CM | POA: Diagnosis present

## 2021-01-29 DIAGNOSIS — E860 Dehydration: Secondary | ICD-10-CM | POA: Diagnosis present

## 2021-01-29 DIAGNOSIS — E871 Hypo-osmolality and hyponatremia: Secondary | ICD-10-CM | POA: Diagnosis present

## 2021-01-29 DIAGNOSIS — Z7982 Long term (current) use of aspirin: Secondary | ICD-10-CM

## 2021-01-29 DIAGNOSIS — J449 Chronic obstructive pulmonary disease, unspecified: Secondary | ICD-10-CM

## 2021-01-29 DIAGNOSIS — Z7902 Long term (current) use of antithrombotics/antiplatelets: Secondary | ICD-10-CM | POA: Diagnosis not present

## 2021-01-29 DIAGNOSIS — J9621 Acute and chronic respiratory failure with hypoxia: Secondary | ICD-10-CM

## 2021-01-29 DIAGNOSIS — I739 Peripheral vascular disease, unspecified: Secondary | ICD-10-CM | POA: Diagnosis present

## 2021-01-29 DIAGNOSIS — Z880 Allergy status to penicillin: Secondary | ICD-10-CM

## 2021-01-29 DIAGNOSIS — G629 Polyneuropathy, unspecified: Secondary | ICD-10-CM | POA: Diagnosis present

## 2021-01-29 DIAGNOSIS — I708 Atherosclerosis of other arteries: Secondary | ICD-10-CM | POA: Diagnosis present

## 2021-01-29 DIAGNOSIS — G2581 Restless legs syndrome: Secondary | ICD-10-CM | POA: Diagnosis present

## 2021-01-29 DIAGNOSIS — I9589 Other hypotension: Secondary | ICD-10-CM

## 2021-01-29 DIAGNOSIS — E039 Hypothyroidism, unspecified: Secondary | ICD-10-CM

## 2021-01-29 DIAGNOSIS — I10 Essential (primary) hypertension: Secondary | ICD-10-CM | POA: Diagnosis present

## 2021-01-29 DIAGNOSIS — K529 Noninfective gastroenteritis and colitis, unspecified: Secondary | ICD-10-CM | POA: Diagnosis present

## 2021-01-29 DIAGNOSIS — Z79899 Other long term (current) drug therapy: Secondary | ICD-10-CM

## 2021-01-29 DIAGNOSIS — R0902 Hypoxemia: Secondary | ICD-10-CM

## 2021-01-29 DIAGNOSIS — Z79890 Hormone replacement therapy (postmenopausal): Secondary | ICD-10-CM

## 2021-01-29 DIAGNOSIS — R413 Other amnesia: Secondary | ICD-10-CM

## 2021-01-29 DIAGNOSIS — R1084 Generalized abdominal pain: Secondary | ICD-10-CM

## 2021-01-29 DIAGNOSIS — E861 Hypovolemia: Secondary | ICD-10-CM

## 2021-01-29 HISTORY — DX: Chronic obstructive pulmonary disease, unspecified: J44.9

## 2021-01-29 HISTORY — DX: Essential (primary) hypertension: I10

## 2021-01-29 HISTORY — DX: Gastro-esophageal reflux disease without esophagitis: K21.9

## 2021-01-29 LAB — URINALYSIS, ROUTINE W REFLEX MICROSCOPIC
Bacteria, UA: NONE SEEN
Bilirubin Urine: NEGATIVE
Glucose, UA: NEGATIVE mg/dL
Ketones, ur: NEGATIVE mg/dL
Leukocytes,Ua: NEGATIVE
Nitrite: NEGATIVE
Protein, ur: NEGATIVE mg/dL
Specific Gravity, Urine: 1.016 (ref 1.005–1.030)
pH: 7 (ref 5.0–8.0)

## 2021-01-29 LAB — CBC
HCT: 37 % (ref 36.0–46.0)
Hemoglobin: 12.1 g/dL (ref 12.0–15.0)
MCH: 29.7 pg (ref 26.0–34.0)
MCHC: 32.7 g/dL (ref 30.0–36.0)
MCV: 90.9 fL (ref 80.0–100.0)
Platelets: 256 10*3/uL (ref 150–400)
RBC: 4.07 MIL/uL (ref 3.87–5.11)
RDW: 13.5 % (ref 11.5–15.5)
WBC: 14 10*3/uL — ABNORMAL HIGH (ref 4.0–10.5)
nRBC: 0 % (ref 0.0–0.2)

## 2021-01-29 LAB — TYPE AND SCREEN
ABO/RH(D): O NEG
Antibody Screen: NEGATIVE

## 2021-01-29 LAB — TROPONIN I (HIGH SENSITIVITY)
Troponin I (High Sensitivity): 9 ng/L (ref <18)
Troponin I (High Sensitivity): 9 ng/L (ref <18)

## 2021-01-29 LAB — COMPREHENSIVE METABOLIC PANEL
ALT: 18 U/L (ref 0–44)
AST: 25 U/L (ref 15–41)
Albumin: 3.3 g/dL — ABNORMAL LOW (ref 3.5–5.0)
Alkaline Phosphatase: 68 U/L (ref 38–126)
Anion gap: 9 (ref 5–15)
BUN: 23 mg/dL (ref 8–23)
CO2: 27 mmol/L (ref 22–32)
Calcium: 8.2 mg/dL — ABNORMAL LOW (ref 8.9–10.3)
Chloride: 94 mmol/L — ABNORMAL LOW (ref 98–111)
Creatinine, Ser: 1.12 mg/dL — ABNORMAL HIGH (ref 0.44–1.00)
GFR, Estimated: 48 mL/min — ABNORMAL LOW (ref >60)
Glucose, Bld: 133 mg/dL — ABNORMAL HIGH (ref 70–99)
Potassium: 3.9 mmol/L (ref 3.5–5.1)
Sodium: 130 mmol/L — ABNORMAL LOW (ref 135–145)
Total Bilirubin: 0.4 mg/dL (ref 0.3–1.2)
Total Protein: 6.1 g/dL — ABNORMAL LOW (ref 6.5–8.1)

## 2021-01-29 LAB — RESP PANEL BY RT-PCR (FLU A&B, COVID) ARPGX2
Influenza A by PCR: NEGATIVE
Influenza B by PCR: NEGATIVE
SARS Coronavirus 2 by RT PCR: POSITIVE — AB

## 2021-01-29 LAB — PROTIME-INR
INR: 0.9 (ref 0.8–1.2)
Prothrombin Time: 12.6 seconds (ref 11.4–15.2)

## 2021-01-29 LAB — LACTIC ACID, PLASMA: Lactic Acid, Venous: 1.6 mmol/L (ref 0.5–1.9)

## 2021-01-29 LAB — LIPASE, BLOOD: Lipase: 42 U/L (ref 11–51)

## 2021-01-29 LAB — APTT: aPTT: 32 seconds (ref 24–36)

## 2021-01-29 MED ORDER — LACTATED RINGERS IV BOLUS
1000.0000 mL | Freq: Once | INTRAVENOUS | Status: AC
  Administered 2021-01-29: 1000 mL via INTRAVENOUS

## 2021-01-29 MED ORDER — HYDRALAZINE HCL 20 MG/ML IJ SOLN: 10.0000 mg | Freq: Four times a day (QID) | INTRAMUSCULAR | Status: DC | PRN

## 2021-01-29 MED ORDER — LEVOTHYROXINE SODIUM 50 MCG PO TABS
75.0000 ug | ORAL_TABLET | Freq: Every day | ORAL | Status: DC
  Administered 2021-01-30 – 2021-02-02 (×4): 75 ug via ORAL
  Filled 2021-01-29 (×5): qty 1

## 2021-01-29 MED ORDER — HYDROCODONE-ACETAMINOPHEN 5-325 MG PO TABS
1.0000 | ORAL_TABLET | ORAL | Status: DC | PRN
  Administered 2021-01-29: 1 via ORAL
  Administered 2021-01-30: 2 via ORAL
  Administered 2021-01-30: 1 via ORAL
  Administered 2021-01-30: 2 via ORAL
  Administered 2021-01-30 (×2): 1 via ORAL
  Administered 2021-01-31 (×2): 2 via ORAL
  Administered 2021-01-31 – 2021-02-01 (×3): 1 via ORAL
  Administered 2021-02-01 – 2021-02-02 (×5): 2 via ORAL
  Filled 2021-01-29 (×2): qty 2
  Filled 2021-01-29: qty 1
  Filled 2021-01-29 (×2): qty 2
  Filled 2021-01-29 (×2): qty 1
  Filled 2021-01-29: qty 2
  Filled 2021-01-29: qty 1
  Filled 2021-01-29: qty 2
  Filled 2021-01-29 (×2): qty 1
  Filled 2021-01-29 (×3): qty 2
  Filled 2021-01-29: qty 1

## 2021-01-29 MED ORDER — PRAMIPEXOLE DIHYDROCHLORIDE 0.25 MG PO TABS
0.2500 mg | ORAL_TABLET | Freq: Every day | ORAL | Status: DC
  Administered 2021-01-30 – 2021-02-01 (×4): 0.25 mg via ORAL
  Filled 2021-01-29 (×5): qty 1

## 2021-01-29 MED ORDER — MEMANTINE HCL 10 MG PO TABS
5.0000 mg | ORAL_TABLET | Freq: Two times a day (BID) | ORAL | Status: DC
  Administered 2021-01-30 – 2021-02-02 (×8): 5 mg via ORAL
  Filled 2021-01-29 (×8): qty 1

## 2021-01-29 MED ORDER — OXYCODONE HCL 5 MG PO TABS: 5.0000 mg | ORAL_TABLET | ORAL | Status: DC | PRN

## 2021-01-29 MED ORDER — ALBUTEROL SULFATE HFA 108 (90 BASE) MCG/ACT IN AERS
2.0000 | INHALATION_SPRAY | Freq: Four times a day (QID) | RESPIRATORY_TRACT | Status: DC
  Administered 2021-01-30 – 2021-02-01 (×10): 2 via RESPIRATORY_TRACT
  Filled 2021-01-29: qty 6.7

## 2021-01-29 MED ORDER — ACETAMINOPHEN 650 MG RE SUPP: 650.0000 mg | Freq: Four times a day (QID) | RECTAL | Status: DC | PRN

## 2021-01-29 MED ORDER — LOSARTAN POTASSIUM 25 MG PO TABS: 100.0000 mg | ORAL_TABLET | Freq: Every day | ORAL | Status: DC

## 2021-01-29 MED ORDER — ONDANSETRON HCL 4 MG PO TABS: 4.0000 mg | ORAL_TABLET | Freq: Four times a day (QID) | ORAL | Status: DC | PRN

## 2021-01-29 MED ORDER — AMLODIPINE BESYLATE 5 MG PO TABS: 5.0000 mg | ORAL_TABLET | Freq: Every day | ORAL | Status: DC

## 2021-01-29 MED ORDER — IOHEXOL 350 MG/ML SOLN
100.0000 mL | Freq: Once | INTRAVENOUS | Status: AC | PRN
  Administered 2021-01-29: 75 mL via INTRAVENOUS

## 2021-01-29 MED ORDER — CYCLOBENZAPRINE HCL 5 MG PO TABS
5.0000 mg | ORAL_TABLET | Freq: Three times a day (TID) | ORAL | Status: DC | PRN
  Administered 2021-02-02: 5 mg via ORAL
  Filled 2021-01-29: qty 1

## 2021-01-29 MED ORDER — GABAPENTIN 400 MG PO CAPS
800.0000 mg | ORAL_CAPSULE | Freq: Three times a day (TID) | ORAL | Status: DC
  Administered 2021-01-29 – 2021-02-02 (×12): 800 mg via ORAL
  Filled 2021-01-29 (×9): qty 2
  Filled 2021-01-29: qty 8
  Filled 2021-01-29 (×2): qty 2

## 2021-01-29 MED ORDER — MORPHINE SULFATE (PF) 2 MG/ML IV SOLN: 2.0000 mg | INTRAVENOUS | Status: DC | PRN

## 2021-01-29 MED ORDER — PANTOPRAZOLE SODIUM 40 MG PO TBEC
40.0000 mg | DELAYED_RELEASE_TABLET | Freq: Every day | ORAL | Status: DC
  Administered 2021-01-29 – 2021-02-02 (×5): 40 mg via ORAL
  Filled 2021-01-29 (×5): qty 1

## 2021-01-29 MED ORDER — ASPIRIN EC 81 MG PO TBEC
81.0000 mg | DELAYED_RELEASE_TABLET | Freq: Every day | ORAL | Status: DC
  Administered 2021-01-29 – 2021-02-02 (×5): 81 mg via ORAL
  Filled 2021-01-29 (×5): qty 1

## 2021-01-29 MED ORDER — DEXAMETHASONE SODIUM PHOSPHATE 10 MG/ML IJ SOLN: 10.0000 mg | Freq: Once | INTRAMUSCULAR | Status: DC

## 2021-01-29 MED ORDER — ONDANSETRON HCL 4 MG/2ML IJ SOLN
4.0000 mg | Freq: Four times a day (QID) | INTRAMUSCULAR | Status: DC | PRN
  Administered 2021-01-31 – 2021-02-02 (×2): 4 mg via INTRAVENOUS
  Filled 2021-01-29 (×2): qty 2

## 2021-01-29 MED ORDER — ENOXAPARIN SODIUM 30 MG/0.3ML IJ SOSY
30.0000 mg | PREFILLED_SYRINGE | INTRAMUSCULAR | Status: DC
  Administered 2021-01-30 – 2021-01-31 (×3): 30 mg via SUBCUTANEOUS
  Filled 2021-01-29 (×3): qty 0.3

## 2021-01-29 MED ORDER — CLOPIDOGREL BISULFATE 75 MG PO TABS
75.0000 mg | ORAL_TABLET | Freq: Every day | ORAL | Status: DC
  Administered 2021-01-29 – 2021-02-02 (×5): 75 mg via ORAL
  Filled 2021-01-29 (×5): qty 1

## 2021-01-29 MED ORDER — ACETAMINOPHEN 325 MG PO TABS: 650.0000 mg | ORAL_TABLET | Freq: Four times a day (QID) | ORAL | Status: DC | PRN

## 2021-01-29 MED ORDER — NIRMATRELVIR/RITONAVIR (PAXLOVID) TABLET (RENAL DOSING)
2.0000 | ORAL_TABLET | Freq: Two times a day (BID) | ORAL | Status: DC
  Administered 2021-01-29 – 2021-02-02 (×9): 2 via ORAL
  Filled 2021-01-29: qty 20

## 2021-01-29 MED ORDER — ALBUTEROL SULFATE (2.5 MG/3ML) 0.083% IN NEBU
2.5000 mg | INHALATION_SOLUTION | Freq: Four times a day (QID) | RESPIRATORY_TRACT | Status: DC
  Administered 2021-01-29: 2.5 mg via RESPIRATORY_TRACT
  Filled 2021-01-29: qty 3

## 2021-01-29 NOTE — ED Notes (Signed)
Patient sleeping at this time.  Respirations even and unlabored.  Lights in room dimmed.

## 2021-01-29 NOTE — H&P (Signed)
[facility] History and Physical  Amy Mckay KGU:[id number] DOB: 1935-05-28 DOA: 01/29/2021 PCP: [name], MD  Admitted from: Home Chief Complaint: Abdominal pain, NVD  History of Present Illness: Amy Mckay is a 86 y.o. female with PMH significant for HTN, PAD s/p SMA stent, COPD, GERD.  Patient presented to the ED this morning with concern of abdominal pain, nausea, vomiting and diarrhea.  On arrival to the ED, her blood pressure was low in the 60s.  She was afebrile, heart rate in 60s.  Breathing on room air With IV hydration, blood pressure improved.  Her oxygen saturation dropped down to 70s and she required 2 L oxygen by nasal cannula. Labs showed sodium low at 130, BUN/creatinine 23/1.12, liver enzymes normal, WBC count elevated to 14, lactic acid level normal, troponin normal COVID PCR positive Urinalysis normal Chest x-ray normal CT head normal CT angio chest, abdomen and pelvis was obtained which showed 1. Left-sided colitis.  2. Atherosclerosis is severe and there has been prior SMA stenting, but no underlying occlusive disease is noted. 3. Advanced bilateral proximal subclavian artery stenosis. Moderate or advanced left common carotid origin stenosis.  Hospitalist consultation was called for inpatient admission and management.  At the time of my evaluation, patient was lying on stretcher.  Watching TV.  Her pain was much better than on arrival. Last vomiting was prior to arrival to the ED.  Last bowel movement was 1 time in the ED few hours ago. Smokes 1 pack/day.  Denies any respiratory symptoms at this time.  On low-flow oxygen.    Review of Systems:  All systems were reviewed and were negative unless otherwise mentioned in the HPI   Past medical history: Past Medical History:  Diagnosis Date   Acid reflux    COPD (chronic obstructive pulmonary disease) (HCC)    Hypertension     Past surgical history: History reviewed. No pertinent  surgical history.  Social History:  reports that she has been smoking cigarettes. She has been smoking an average of 1.5 packs per day. She has never used smokeless tobacco. She reports that she does not drink alcohol and does not use drugs.  Allergies:  Allergies  Allergen Reactions   Penicillins Swelling    Family history:  History reviewed. No pertinent family history.   Home Meds: Prior to Admission medications   Medication Sig Start Date End Date Taking? Authorizing Provider  albuterol (PROVENTIL HFA;VENTOLIN HFA) 108 (90 Base) MCG/ACT inhaler Inhale 2 puffs into the lungs daily as needed for shortness of breath. 06/27/15   [provider]  amLODipine (NORVASC) 10 MG tablet Take 10 mg by mouth daily. 05/27/16   [provider]  aspirin EC 81 MG tablet Take 81 mg by mouth daily.    [provider]  calcium carbonate (CALCIUM 600) 600 MG TABS tablet Take 600 mg by mouth daily.    [provider]  clopidogrel (PLAVIX) 75 MG tablet Take 75 mg by mouth daily. 06/04/16   [provider]  cyclobenzaprine (FLEXERIL) 5 MG tablet Take 5 mg by mouth 3 (three) times daily as needed. 04/25/16   [provider]  fluticasone (FLONASE) 50 MCG/ACT nasal spray Place 1 spray into both nostrils daily. 05/14/16   [provider]  gabapentin (NEURONTIN) 800 MG tablet Take 800 mg by mouth 3 (three) times daily. 04/25/16   [provider]  levothyroxine (SYNTHROID, LEVOTHROID) 75 MCG tablet Take 75 mcg by mouth daily before breakfast. 02/05/16   [provider]  metoCLOPramide (REGLAN) 5 MG tablet Take 5 mg by mouth daily as needed for nausea. 04/18/14   [provider]  pantoprazole (PROTONIX) 40 MG tablet Take 40 mg by mouth daily. 01/23/16   [provider]  VESICARE 5 MG tablet Take 5 mg by mouth daily. 04/25/16   [provider]  Vitamin D, Cholecalciferol, 400 units TABS Take 400 Units by mouth  daily.     [provider]    Physical Exam: Vitals:   01/29/21 1415 01/29/21 1430 01/29/21 1445 01/29/21 1700  BP: 108/70 118/75 114/88 (!) 88/65  Pulse: 79 80 96 69  Resp: Temp:      TempSrc:      SpO2: 98% 98%  97%  Weight:      Height:       Wt Readings from Last 3 Encounters:  01/29/21 55.3 kg   Body mass index is 23.05 kg/m.  General exam: Pleasant elderly Caucasian female.  Not in distress Skin: No rashes, lesions or ulcers. HEENT: Atraumatic, normocephalic, no obvious bleeding Lungs: End expiratory wheezing bilaterally CVS: Regular rate and rhythm, no murmur GI/Abd soft, mild diffuse tenderness, nondistended, bowel sound present CNS: Alert, awake, oriented x3 Psychiatry: Mood appropriate. Extremities: No pedal edema, no calf tenderness     Consult Orders  (From admission, onward)           Start     Ordered   01/29/21 1125  Consult to hospitalist  Once       Provider:  (Not yet assigned)  Question Answer Comment  Place call to: [facility]   Reason for Consult Admit      01/29/21 1124            Labs on Admission:   CBC: Recent Labs  Lab 01/29/21 0637  WBC 14.0*  HGB 12.1  HCT 37.0  MCV 90.9  PLT 256    Basic Metabolic Panel: Recent Labs  Lab 01/29/21 0637  NA 130*  K 3.9  CL 94*  CO2 27  GLUCOSE 133*  BUN 23  CREATININE 1.12*  CALCIUM 8.2*    Liver Function Tests: Recent Labs  Lab 01/29/21 0637  AST 25  ALT 18  ALKPHOS 68  BILITOT 0.4  PROT 6.1*  ALBUMIN 3.3*   Recent Labs  Lab 01/29/21 0637  LIPASE 42   No results for input(s): AMMONIA in the last 168 hours.  Cardiac Enzymes: No results for input(s): CKTOTAL, CKMB, CKMBINDEX, TROPONINI in the last 168 hours.  BNP (last 3 results) No results for input(s): BNP in the last 8760 hours.  ProBNP (last 3 results) No results for input(s): PROBNP in the last 8760 hours.  CBG: No results for input(s): GLUCAP in the last 168  hours.  Lipase     Component Value Date/Time   LIPASE 42 01/29/2021 0637     Urinalysis    Component Value Date/Time   COLORURINE STRAW (A) 01/29/2021 1254   APPEARANCEUR CLEAR 01/29/2021 1254   LABSPEC 1.016 01/29/2021 1254   PHURINE 7.0 01/29/2021 1254   GLUCOSEU NEGATIVE 01/29/2021 1254   HGBUR MODERATE (A) 01/29/2021 1254   BILIRUBINUR NEGATIVE 01/29/2021 1254   KETONESUR NEGATIVE 01/29/2021 1254   PROTEINUR NEGATIVE 01/29/2021 1254   NITRITE NEGATIVE 01/29/2021 1254   LEUKOCYTESUR NEGATIVE 01/29/2021 1254     Drugs of Abuse  No results found for: LABOPIA, COCAINSCRNUR, LABBENZ, AMPHETMU, THCU, LABBARB    Radiological Exams on Admission: CT  HEAD WO CONTRAST ( )  Result Date: 01/29/2021 CLINICAL DATA:  Facial trauma EXAM: CT HEAD WITHOUT CONTRAST TECHNIQUE: Contiguous axial images were obtained from the base of the skull through the vertex without intravenous contrast. COMPARISON:  CT head 05/14/2016 FINDINGS: Brain: There is no evidence of acute intracranial hemorrhage, extra-axial fluid collection, or infarct. The CSF spaces overlying the frontal lobes are prominent, unchanged. The ventricles are enlarged. No mass lesion is identified. There is no midline shift. Vascular: There is calcification of the bilateral cavernous ICAs. Skull: Normal. Negative for fracture or focal lesion. Sinuses/Orbits: The imaged paranasal sinuses are clear. Bilateral lens implants are in place. The globes and orbits are otherwise unremarkable. Other: None. IMPRESSION: No acute intracranial hemorrhage or calvarial fracture. Electronically Signed   By: Lesia Hausen M.D.   On: 01/29/2021 10:13   DG Chest Port 1 View  Result Date: 01/29/2021 CLINICAL DATA:  Fall. EXAM: PORTABLE CHEST 1 VIEW COMPARISON:  Chest x-ray dated Oct 22, 2016. FINDINGS: The heart size and mediastinal contours are within normal limits. Both lungs are clear. The visualized skeletal structures are unremarkable. IMPRESSION: No  active disease. Electronically Signed   By: Obie Dredge M.D.   On: 01/29/2021 06:59   CT Angio Chest/Abd/Pel for Dissection W and/or Wo Contrast  Result Date: 01/29/2021 CLINICAL DATA:  Abdominal pain with aortic dissection suspected. History of fall and diarrhea EXAM: CT ANGIOGRAPHY CHEST, ABDOMEN AND PELVIS TECHNIQUE: Non-contrast CT of the chest was initially obtained. Multidetector CT imaging through the chest, abdomen and pelvis was performed using the standard protocol during bolus administration of intravenous contrast. Multiplanar reconstructed images and MIPs were obtained and reviewed to evaluate the vascular anatomy. CONTRAST:  95mL OMNIPAQUE IOHEXOL 350 MG/ML SOLN COMPARISON:  10/22/2016 chest CTA FINDINGS: CTA CHEST FINDINGS Cardiovascular: Preferential opacification of the thoracic aorta. No evidence of thoracic aortic aneurysm or dissection. Normal heart size. No pericardial effusion. Extensive irregular atheromatous plaque involving the aorta, especially arch and distal segment. Irregular plaque involves the great vessels with severe narrowings of both proximal subclavian arteries. Potentially flow reducing stenosis at the left common carotid origin, limited by motion artifact and calcified plaque density. Mediastinum/Nodes: Negative for adenopathy or mass Lungs/Pleura: Generalized airway thickening. There is no edema, consolidation, effusion, or pneumothorax. Flat/triangular nodule measuring 6 x 2 mm in the subpleural right lower lobe along the lower major fissure. The morphology is most consistent with lymph node. No worrisome pulmonary nodule. Musculoskeletal: No acute finding. Review of the MIP images confirms the above findings. CTA ABDOMEN AND PELVIS FINDINGS VASCULAR Aorta: Severe and diffuse atheromatous plaque. No aneurysm or dissection. No evidence of mural inflammation Celiac: Patent ostial stent. Atherosclerosis of major branches. No acute finding or proximal stenosis SMA: Ostial  stenting which is patent. No acute finding such is branch occlusion. Renals: Extensive atheromatous plaque bilaterally with high-grade narrowing on the left but symmetric renal enhancement. IMA: Patent, as is the left colic artery Inflow: Confluent atheromatous plaque bilaterally. No aneurysm, dissection, or focal/flow limiting stenosis. Veins: A venous phase was not acquired. Review of the MIP images confirms the above findings. NON-VASCULAR Hepatobiliary: No hepatic injury or perihepatic hematoma. Gallbladder is unremarkable Pancreas: Negative Spleen: No splenic injury or perisplenic hematoma. Adrenals/Urinary Tract: No adrenal hemorrhage or renal injury identified. Bladder is unremarkable. Lobulated renal cortex from scarring. Stomach/Bowel: Submucosal low-density thickening of the left colon (distal transverse to distal descending) with mild pericolonic fat stranding. No obstruction. Left colonic diverticulosis without focal inflammation. Lymphatic: Negative for adenopathy or  mass. Reproductive: Hysterectomy Other: No ascites or pneumoperitoneum Musculoskeletal: Advanced lumbar spine degeneration with levoscoliosis. No acute finding Review of the MIP images confirms the above findings. IMPRESSION: 1. Left-sided colitis. Atherosclerosis is severe and there has been prior SMA stenting, but no underlying occlusive disease is noted. 2. Advanced bilateral proximal subclavian artery stenosis. Moderate or advanced left common carotid origin stenosis. Electronically Signed   By: Marnee Spring M.D.   On: 01/29/2021 10:31     ------------------------------------------------------------------------------------------------------ Assessment/Plan: Active Problems:   Colitis  Acute gastroenteritis Left-sided colitis Presented with severe abdomen pain, nausea, vomiting, diarrhea CT abdomen with findings of left-sided colitis.  Patient may also have COVID-related GI symptoms. Monitor for GI symptoms.  Obtain GI  pathogen panel Pain medicines as needed.  COVID infection Acute respiratory with hypoxia COVID PCR positive but no respiratory symptoms.  No evidence of pneumonia. She was noted to have a low oxygen saturation of 70s transiently in the ED.  Placed on 2 L oxygen.  No infiltrate on chest x-ray.   Will start on a course of Paxlovid. Continue to monitor respiratory status.  Peripheral artery disease S/p SMA stent in the past CT angio shows severe atherosclerosis as well as advanced bilateral proximal subclavian artery stenosis, moderate/advanced left common carotid origin stenosis. Continue aspirin, Plavix.  Not on statin Obtain lipid panel.  Follow-up with vascular surgery as an outpatient.  Essential hypertension Home meds include amlodipine 5 mg daily, losartan 100 mg daily. Give him on hold because of low blood pressure at this time.  COPD Current everyday smoker Smokes 1 pack/day.  Counseled to quit.  GERD Protonix  Hypothyroidism Resume Synthroid.  Memory impairment Namenda  Restless legs, peripheral neuropathy On Neurontin, Flexeril, pramipexole.   Mobility: Encourage ambulation Code Status:   Code Status: Full Code  DVT prophylaxis: Lovenox subcu Antimicrobials: Paxlovid Fluid: None  Diet:  Diet Order             Diet clear liquid Room service appropriate? Yes; Fluid consistency: Thin  Diet effective now                   Consultants: None Family Communication: None at bedside    Dispo: The patient is from: Home              Anticipated d/c is to: Home              Anticipated d/c date is: 2 days  ------------------------------------------------------------------------------------- Severity of Illness: The appropriate patient status for this patient is INPATIENT. Inpatient status is judged to be reasonable and necessary in order to provide the required intensity of service to ensure the patient's safety. The patient's presenting symptoms, physical  exam findings, and initial radiographic and laboratory data in the context of their chronic comorbidities is felt to place them at high risk for further clinical deterioration. Furthermore, it is not anticipated that the patient will be medically stable for discharge from the hospital within 2 midnights of admission. The following factors support the patient status of inpatient.   " The patient's presenting symptoms include severe abdomen pain, nausea, vomiting. " The worrisome physical exam findings include hypoxia, abdominal tenderness. " The initial radiographic and laboratory data are worrisome because of colitis, COVID positive. " The chronic co-morbidities include vasculopathy, heavy smoking.   * I certify that at the point of admission it is my clinical judgment that the patient will require inpatient hospital care spanning beyond 2 midnights from the point of admission due  to high intensity of service, high risk for further deterioration and high frequency of surveillance required.*   Signed, Lorin GlassBinaya Tully Burgo, MD [facility] 01/29/2021

## 2021-01-29 NOTE — ED Provider Notes (Signed)
  Physical Exam  BP 111/74 (BP Location: Left Arm)   Pulse 70   Temp 97.9 F (36.6 C) (Oral)   Resp 15   Ht 5\' 1"  (1.549 m)   Wt 55.3 kg   SpO2 97%   BMI 23.05 kg/m   Physical Exam  ED Course/Procedures     Procedures  MDM  Received care of patient from Dr. .  Please see his note for prior history, physical and care.  Briefly this is an 86 year old female who presented with concern for abdominal pain nausea, and diarrhea.  Initially on arrival to the emergency department, her blood pressures were in the 60s systolic.  Labs were ordered, she was given IV fluids and CT head was ordered with concern for fall, with CT angio chest abdomen pelvis ordered given her history of prior intra-abdominal stenting and similar symptoms.  Labs show very mild increase of creatinine from 0.8-1.12, leukocytosis with a white count of 14,000, mild hyponatremia, troponin negative, lactic acid negative. COVID 19 testing positive.   Blood pressures responded to IV fluids and have remained stable.   CT completed shows atherosclerosis and colitis on left but nos ing of SMA occlusion.  Unclear if symptoms related to hypotension leading to colitis in setting of severe atherosclerosis, or if colitis secondary to covid 19.   Oxygen levels were upper 80s on arrival, has been stable on 2L O2. Discussed with pharmacy, recommends paxlovid for treatment of covid in setting of mild hypoxia. Will admit for continued care and observation.      02-13-1996, MD 01/29/21 2207

## 2021-01-29 NOTE — ED Triage Notes (Signed)
BIB EMS from home for fall. Pt states she tripped on something due to rushing to the bathroom with diarrhea. Pt c/o abdominal pain, N/V/D. Pt states feeling is similar to when she had abdominal stents placed.

## 2021-01-29 NOTE — ED Notes (Signed)
Patient transported to floor at this time.

## 2021-01-29 NOTE — ED Notes (Signed)
Patient sleeping at this time. Respirations even and unlabored.

## 2021-01-29 NOTE — ED Provider Notes (Signed)
[facility]-EMERGENCY DEPT [facility] Emergency Department Provider Note MRN:  [id number]  Arrival date & time: 01/29/21     Chief Complaint   Fall, Abdominal Pain, Nausea, Emesis, and Diarrhea   History of Present Illness   Amy Mckay is a 86 y.o. year-old female with a history of PVD presenting to the ED with chief complaint of abdominal pain.  Abdominal pain, diarrhea, vomiting, over the past several hours.  Felt generally well yesterday but started having some discomfort before going to bed last night.  Had a fall this morning.  Denies head trauma.  Endorsing continued diffuse abdominal pain.  No chest pain.  No fever recently.  Review of Systems  A complete 10 system review of systems was obtained and all systems are negative except as noted in the HPI and PMH.   Patient's Health History    Past Medical History:  Diagnosis Date   Acid reflux    COPD (chronic obstructive pulmonary disease) (HCC)    Hypertension     History reviewed. No pertinent surgical history.  No family history on file.  Social History   Socioeconomic History   Marital status: Single    Spouse name: Not on file   Number of children: Not on file   Years of education: Not on file   Highest education level: Not on file  Occupational History   Not on file  Tobacco Use   Smoking status: Every Day    Packs/day: 1.50    Types: Cigarettes   Smokeless tobacco: Never  Vaping Use   Vaping Use: Never used  Substance and Sexual Activity   Alcohol use: No   Drug use: No   Sexual activity: Not on file  Other Topics Concern   Not on file  Social History Narrative   Not on file   Social Determinants of Health   Financial Resource Strain: Not on file  Food Insecurity: Not on file  Transportation Needs: Not on file  Physical Activity: Not on file  Stress: Not on file  Social Connections: Not on file  Intimate Partner Violence: Not on file     Physical Exam   Vitals:   01/29/21 0645 01/29/21  0700  BP: 111/81 124/73  Pulse: 78 78  Resp: 20 11  Temp:    SpO2: 91% 91%    CONSTITUTIONAL: Chronically ill-appearing, NAD NEURO:  Alert and oriented x 3, no focal deficits EYES:  eyes equal and reactive ENT/NECK:  no LAD, no JVD CARDIO: Regular rate, well-perfused, normal S1 and S2 PULM:  CTAB no wheezing or rhonchi GI/GU:  normal bowel sounds, non-distended, non-tender MSK/SPINE:  No gross deformities, no edema SKIN:  no rash, atraumatic PSYCH:  Appropriate speech and behavior  *Additional and/or pertinent findings included in MDM below  Diagnostic and Interventional Summary    EKG Interpretation  Date/Time:  Wednesday January 29 2021 06:36:18 EDT Ventricular Rate:  73 PR Interval:  165 QRS Duration: 85 QT Interval:  418 QTC Calculation: 461 R Axis:   76 Text Interpretation: Sinus rhythm Right atrial enlargement Anteroseptal infarct, age indeterminate Confirmed by Kennis Carina [phone]) on 01/29/2021 7:06:00 AM       Labs Reviewed  CBC - Abnormal; Notable for the following components:      Result Value   WBC 14.0 (*)    All other components within normal limits  CULTURE, BLOOD (ROUTINE X 2)  CULTURE, BLOOD (ROUTINE X 2)  RESP PANEL BY RT-PCR (FLU A&B, COVID) ARPGX2  PROTIME-INR  APTT  COMPREHENSIVE METABOLIC PANEL  LIPASE, BLOOD  LACTIC ACID, PLASMA  URINALYSIS, ROUTINE W REFLEX MICROSCOPIC  TYPE AND SCREEN  TROPONIN I (HIGH SENSITIVITY)    DG Chest Port 1 View  Final Result    CT HEAD WO CONTRAST ( )    (Results Pending)  CT Angio Chest/Abd/Pel for Dissection W and/or Wo Contrast    (Results Pending)    Medications  lactated ringers bolus 1,000 mL (1,000 mLs Intravenous New Bag/Given 01/29/21 0644)     Procedures  /  Critical Care Procedures  ED Course and Medical Decision Making  I have reviewed the triage vital signs, the nursing notes, and pertinent available records from the EMR.  Listed above are laboratory and imaging tests that I  personally ordered, reviewed, and interpreted and then considered in my medical decision making (see below for details).  Patient arrives hypotensive with abdominal pain, diarrhea.  No fever, not tachycardic.  Reports a history of stents in her blood vessels in her abdomen and feels like this pain is related to them.  History of PVD.  No good documentation of the stents she is referring to.  Suspect will need CTA, providing fluid resuscitation, blood pressure is improved to the 80s systolic.     Blood pressure continues to improve to 124/73 with fluids, still awaiting labs, CT imaging, signed out to oncoming provider at shift change.  Elmer Sow. Pilar Plate, MD [facility] Emergency Medicine [facility] [email or web address]  Final Clinical Impressions(s) / ED Diagnoses     ICD-10-CM   1. Generalized abdominal pain  R10.84       ED Discharge Orders     None        Discharge Instructions Discussed with and Provided to Patient:   Discharge Instructions   None       Sabas Sous, MD 01/29/21 [phone]

## 2021-01-30 LAB — BASIC METABOLIC PANEL
Anion gap: 6 (ref 5–15)
BUN: 10 mg/dL (ref 8–23)
CO2: 26 mmol/L (ref 22–32)
Calcium: 8.1 mg/dL — ABNORMAL LOW (ref 8.9–10.3)
Chloride: 94 mmol/L — ABNORMAL LOW (ref 98–111)
Creatinine, Ser: 0.69 mg/dL (ref 0.44–1.00)
GFR, Estimated: >60 mL/min (ref >60)
Glucose, Bld: 100 mg/dL — ABNORMAL HIGH (ref 70–99)
Potassium: 4.1 mmol/L (ref 3.5–5.1)
Sodium: 126 mmol/L — ABNORMAL LOW (ref 135–145)

## 2021-01-30 LAB — LIPID PANEL
Cholesterol: 118 mg/dL (ref 0–200)
HDL: 26 mg/dL — ABNORMAL LOW (ref >40)
LDL Cholesterol: 73 mg/dL (ref 0–99)
Total CHOL/HDL Ratio: 4.5 RATIO
Triglycerides: 97 mg/dL (ref <150)
VLDL: 19 mg/dL (ref 0–40)

## 2021-01-30 LAB — CBC
HCT: 35.7 % — ABNORMAL LOW (ref 36.0–46.0)
Hemoglobin: 11.7 g/dL — ABNORMAL LOW (ref 12.0–15.0)
MCH: 29.6 pg (ref 26.0–34.0)
MCHC: 32.8 g/dL (ref 30.0–36.0)
MCV: 90.4 fL (ref 80.0–100.0)
Platelets: 226 10*3/uL (ref 150–400)
RBC: 3.95 MIL/uL (ref 3.87–5.11)
RDW: 13.5 % (ref 11.5–15.5)
WBC: 8.3 10*3/uL (ref 4.0–10.5)
nRBC: 0 % (ref 0.0–0.2)

## 2021-01-30 LAB — GASTROINTESTINAL PANEL BY PCR, STOOL (REPLACES STOOL CULTURE)

## 2021-01-30 LAB — ABO/RH: ABO/RH(D): O NEG

## 2021-01-30 MED ORDER — PHENOL 1.4 % MT LIQD
1.0000 | OROMUCOSAL | Status: DC | PRN
  Administered 2021-01-30 – 2021-01-31 (×2): 1 via OROMUCOSAL

## 2021-01-30 MED ORDER — LACTATED RINGERS IV BOLUS
500.0000 mL | Freq: Once | INTRAVENOUS | Status: AC
  Administered 2021-01-30: 500 mL via INTRAVENOUS

## 2021-01-30 MED ORDER — NICOTINE 7 MG/24HR TD PT24
7.0000 mg | MEDICATED_PATCH | Freq: Every day | TRANSDERMAL | Status: AC
  Administered 2021-01-30 – 2021-01-31 (×2): 7 mg via TRANSDERMAL
  Filled 2021-01-30 (×3): qty 1

## 2021-01-30 MED ORDER — SODIUM CHLORIDE 0.9 % IV SOLN: INTRAVENOUS | Status: DC

## 2021-01-30 NOTE — Progress Notes (Signed)
PROGRESS NOTE  Amy Mckay  DOB: 1934/11/04  PCP: [name], MD WUJ:[id number]  DOA: 01/29/2021  LOS: 1 day  Hospital Day: 2   Chief Complaint  Patient presents with   Fall   Abdominal Pain   Nausea   Emesis   Diarrhea    Brief narrative: Amy Mckay is a 86 y.o. female with PMH significant for HTN, PAD s/p SMA stent, COPD, GERD.  Patient presented to the ED on 8/17 with concern of abdominal pain, nausea, vomiting and diarrhea.  In the ED, patient was hypotensive, hypoxic. Labs showed COVID-positive.   Imaging showed evidence of left-sided colitis, severe generalized atherosclerosis without occlusion Patient admitted to hospital service for further evaluation management  Subjective: Patient was seen and examined this morning.  Presented on Caucasian female.  Propped up in bed.  Not in distress.  On low-flow oxygen. Diarrhea improving.  Assessment/Plan: Acute gastroenteritis Left-sided colitis Presented with severe abdomen pain, nausea, vomiting, diarrhea CT abdomen with findings of left-sided colitis.  Patient may also have COVID-related GI symptoms.  GI pathogen panel negative Diarrhea, nausea vomiting improving.  Can advance diet to full liquid today. Pain medicines as needed.   COVID infection Acute respiratory with hypoxia COVID PCR positive but no respiratory symptoms.  No evidence of pneumonia. She was noted to have a low oxygen saturation of 70s transiently in the ED.  Placed on 2 L oxygen.  No infiltrate on chest x-ray.   Patient has been started on Paxlovid.  Continue supplemental oxygen.  Wean down as tolerated.   Peripheral artery disease S/p SMA stent in the past CT angio shows severe atherosclerosis as well as advanced bilateral proximal subclavian artery stenosis, moderate/advanced left common carotid origin stenosis. Continue aspirin, Plavix.  Not on statin Lipid panel with HDL 26, LDL 73.  Follow-up with vascular surgery as an outpatient.    Essential hypertension Home meds include amlodipine 5 mg daily, losartan 100 mg daily. Currently both are on hold and patient's blood pressure is in low normal range.  Continue to hold   COPD Current everyday smoker Smokes 1 pack/day.  Counseled to quit.   GERD Protonix   Hypothyroidism Resume Synthroid.   Memory impairment Namenda   Restless legs, peripheral neuropathy On Neurontin, Flexeril, pramipexole.  Mobility: Obtain PT eval Code Status:   Code Status: Full Code  Nutritional status: Body mass index is 23.05 kg/m.     Diet:  Diet Order             Diet full liquid Room service appropriate? Yes; Fluid consistency: Thin  Diet effective now                  DVT prophylaxis:  enoxaparin (LOVENOX) injection 30 mg Start: 01/29/21 2200   Antimicrobials: None Fluid: Normal saline at 75 mill per hour Consultants: Nephrology Family Communication: None at bedside  Status is: Inpatient  Remains inpatient appropriate because: Needs more IV fluids, IV antibiotics, PT eval  Dispo: The patient is from: Home              Anticipated d/c is to: Home probably with home health in 2 to 3 days              Patient currently is not medically stable to d/c.   Difficult to place patient No     Infusions:   sodium chloride 75 mL/hr at 01/30/21 0900    Scheduled Meds:  albuterol  2 puff Inhalation Q6H  aspirin EC  81 mg Oral Daily   clopidogrel  75 mg Oral Daily   enoxaparin (LOVENOX) injection  30 mg Subcutaneous Q24H   gabapentin  800 mg Oral TID   levothyroxine  75 mcg Oral QAC breakfast   memantine  5 mg Oral BID   nicotine  7 mg Transdermal Daily   nirmatrelvir/ritonavir EUA (renal dosing)  2 tablet Oral BID   pantoprazole  40 mg Oral Daily   pramipexole  0.25 mg Oral QHS    Antimicrobials: Anti-infectives (From admission, onward)    Start     Dose/Rate Route Frequency Ordered Stop   01/29/21 1300  nirmatrelvir/ritonavir EUA (renal dosing)  (PAXLOVID) TABS 2 tablet        2 tablet Oral 2 times daily 01/29/21 1158 02/03/21 0959       PRN meds: acetaminophen **OR** acetaminophen, cyclobenzaprine, hydrALAZINE, HYDROcodone-acetaminophen, morphine injection, ondansetron **OR** ondansetron (ZOFRAN) IV, phenol   Objective: Vitals:   01/30/21 0516 01/30/21 1330  BP: 100/76 114/69  Pulse: 75 77  Resp:  18  Temp: 99.3 F (37.4 C) 98.6 F (37 C)  SpO2: 97% 98%    Intake/Output Summary (Last 24 hours) at 01/30/2021 1419 Last data filed at 01/30/2021 0500 Gross per 24 hour  Intake 413.56 ml  Output --  Net 413.56 ml   Filed Weights   01/29/21 0613  Weight: 55.3 kg   Weight change:  Body mass index is 23.05 kg/m.   Physical Exam: General exam: Pleasant, elderly Caucasian female.  Not in distress at rest. Skin: No rashes, lesions or ulcers. HEENT: Atraumatic, normocephalic, no obvious bleeding Lungs: Diminished air entry in bases. CVS: Regular rate and rhythm, no murmur GI/Abd soft, nontender, nondistended, bowel sound present CNS: Alert, awake, oriented x3 Psychiatry: Mood appropriate Extremities: No pedal edema, no calf tenderness  Data Review: I have personally reviewed the laboratory data and studies available.  Recent Labs  Lab 01/29/21 0637 01/30/21 0514  WBC 14.0* 8.3  HGB 12.1 11.7*  HCT 37.0 35.7*  MCV 90.9 90.4  PLT 256 226   Recent Labs  Lab 01/29/21 0637 01/30/21 0514  NA 130* 126*  K 3.9 4.1  CL 94* 94*  CO2 27 26  GLUCOSE 133* 100*  BUN 23 10  CREATININE 1.12* 0.69  CALCIUM 8.2* 8.1*    F/u labs ordered Unresulted Labs (From admission, onward)     Start     Ordered   01/30/21 0500  Basic metabolic panel  Daily,   R      01/29/21 1158   01/30/21 0500  CBC  Daily,   R      01/29/21 1158            Signed, Lorin Glass, MD [facility] 01/30/2021

## 2021-01-31 LAB — BASIC METABOLIC PANEL
Anion gap: 7 (ref 5–15)
BUN: 6 mg/dL — ABNORMAL LOW (ref 8–23)
CO2: 26 mmol/L (ref 22–32)
Calcium: 8.4 mg/dL — ABNORMAL LOW (ref 8.9–10.3)
Chloride: 100 mmol/L (ref 98–111)
Creatinine, Ser: 0.58 mg/dL (ref 0.44–1.00)
GFR, Estimated: >60 mL/min (ref >60)
Glucose, Bld: 92 mg/dL (ref 70–99)
Potassium: 4.3 mmol/L (ref 3.5–5.1)
Sodium: 133 mmol/L — ABNORMAL LOW (ref 135–145)

## 2021-01-31 LAB — CBC
HCT: 38.5 % (ref 36.0–46.0)
Hemoglobin: 12.6 g/dL (ref 12.0–15.0)
MCH: 29.8 pg (ref 26.0–34.0)
MCHC: 32.7 g/dL (ref 30.0–36.0)
MCV: 91 fL (ref 80.0–100.0)
Platelets: 239 10*3/uL (ref 150–400)
RBC: 4.23 MIL/uL (ref 3.87–5.11)
RDW: 13.7 % (ref 11.5–15.5)
WBC: 7.9 10*3/uL (ref 4.0–10.5)
nRBC: 0 % (ref 0.0–0.2)

## 2021-01-31 MED ORDER — LOSARTAN POTASSIUM 50 MG PO TABS
100.0000 mg | ORAL_TABLET | Freq: Every day | ORAL | Status: DC
  Administered 2021-01-31 – 2021-02-02 (×3): 100 mg via ORAL
  Filled 2021-01-31 (×3): qty 2

## 2021-01-31 MED ORDER — AMLODIPINE BESYLATE 5 MG PO TABS
5.0000 mg | ORAL_TABLET | Freq: Every day | ORAL | Status: DC
  Administered 2021-01-31 – 2021-02-02 (×3): 5 mg via ORAL
  Filled 2021-01-31 (×3): qty 1

## 2021-01-31 NOTE — Evaluation (Addendum)
Physical Therapy Evaluation Patient Details Name: Amy Mckay MRN: [id number] DOB: Mar 06, 1935 Today's Date: 01/31/2021   History of Present Illness  86 y.o. female 86 y.o. female presented to the ED on 8/17 with concern of abdominal pain, nausea, vomiting and diarrhea. Dx of covid, colitis. Pt with PMH significant for HTN, PAD s/p SMA stent, COPD, GERD  Clinical Impression  Pt admitted with above diagnosis. Pt ambulated 5' with RW, distance limited by lightheadedness. BP 113/83 standing, SaO2 89% on room air with activity, 95% on 2L O2 at rest. Assisted pt to pivot to recliner.   Pt does not have 24 hour assistance available at home, she lives with her daughter who is disabled. At her current functional level, she's not safe to DC home due to poor activity tolerance. Pt may need ST-SNF, depending on progress. Pt currently with functional limitations due to the deficits listed below (see PT Problem List). Pt will benefit from skilled PT to increase their independence and safety with mobility to allow discharge to the venue listed below.       Follow Up Recommendations SNF;Supervision for mobility/OOB    Equipment Recommendations  3in1 (PT)    Recommendations for Other Services       Precautions / Restrictions Precautions Precautions: Fall Precaution Comments: fell just prior to admission Restrictions Weight Bearing Restrictions: No      Mobility  Bed Mobility Overal bed mobility: Needs Assistance Bed Mobility: Supine to Sit     Supine to sit: Modified independent (Device/Increase time);HOB elevated          Transfers Overall transfer level: Needs assistance Equipment used: Rolling walker (2 wheeled) Transfers: Sit to/from [employer] Sit to Stand: Min guard Stand pivot transfers: Min guard       General transfer comment: VCs hand placement with RW  Ambulation/Gait Ambulation/Gait assistance: Min guard Gait Distance (Feet): 5 Feet Assistive  device: Rolling walker (2 wheeled) Gait Pattern/deviations: Step-through pattern     General Gait Details: distance limited by pt feeling lightheaded while walking. standing BP 113/83; SaO2 89% on room air with mobility, 95% on 2L O2 at rest  Stairs            Wheelchair Mobility    Modified Rankin (Stroke Patients Only)       Balance Overall balance assessment: Needs assistance;History of Falls   Sitting balance-Leahy Scale: Good       Standing balance-Leahy Scale: Fair                               Pertinent Vitals/Pain Pain Assessment: No/denies pain    Home Living Family/patient expects to be discharged to:: Private residence Living Arrangements: Children     Home Access: Stairs to enter Entrance Stairs-Rails: Right Entrance Stairs-Number of Steps: 5 Home Layout: One level Home Equipment: Environmental consultant - 4 wheels      Prior Function Level of Independence: Independent         Comments: lives with daughter who is disabled and 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, production), 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     Higher education careers adviser        Extremity/Trunk Assessment   Upper Extremity Assessment Upper Extremity Assessment: Overall WFL for tasks assessed    Lower Extremity Assessment Lower Extremity Assessment: Overall WFL for tasks assessed    Cervical / Trunk Assessment Cervical / Trunk Assessment: Normal  Communication   Communication: HOH  Cognition Arousal/Alertness: Awake/alert Behavior During Therapy: [facility]  for tasks assessed/performed Overall Cognitive Status: Within Functional Limits for tasks assessed                                        General Comments      Exercises     Assessment/Plan    PT Assessment Patient needs continued PT services  PT Problem List Decreased mobility;Cardiopulmonary status limiting activity;Decreased activity tolerance       PT Treatment Interventions Gait training;DME instruction;Therapeutic  activities;Therapeutic exercise;Functional mobility training;Patient/family education    PT Goals (Current goals can be found in the Care Plan section)  Acute Rehab PT Goals Patient Stated Goal: to get home to be able to help her disabled daughter PT Goal Formulation: With patient Time For Goal Achievement: 02/14/21 Potential to Achieve Goals: Good    Frequency Min 3X/week   Barriers to discharge        Co-evaluation               AM-PAC PT "6 Clicks" Mobility  Outcome Measure Help needed turning from your back to your side while in a flat bed without using bedrails?: None Help needed moving from lying on your back to sitting on the side of a flat bed without using bedrails?: A Little Help needed moving to and from a bed to a chair (including a wheelchair)?: A Little Help needed standing up from a chair using your arms (e.g., wheelchair or bedside chair)?: A Little Help needed to walk in hospital room?: A Little Help needed climbing 3-5 steps with a railing? : A Lot 6 Click Score: 18    End of Session Equipment Utilized During Treatment: Gait belt Activity Tolerance: Treatment limited secondary to medical complications (Comment) (lightheaded in standing) Patient left: in chair;with call bell/phone within reach;with chair alarm set Nurse Communication: Mobility status PT Visit Diagnosis: Difficulty in walking, not elsewhere classified (R26.2);History of falling (Z91.81)    Time: 5009-3818 PT Time Calculation (min) (ACUTE ONLY): 38 min   Charges:   PT Evaluation $PT Eval Moderate Complexity: 1 Mod PT Treatments $Gait Training: 8-22 mins $Therapeutic Activity: 8-22 mins      Ralene Bathe Kistler PT 01/31/2021  Acute Rehabilitation Services Pager [phone] Office [phone]

## 2021-01-31 NOTE — Progress Notes (Signed)
PROGRESS NOTE  Uma Jerde  DOB: July 26, 1934  PCP: [name], MD KPT:[id number]  DOA: 01/29/2021  LOS: 2 days  Hospital Day: 3   Chief Complaint  Patient presents with   Fall   Abdominal Pain   Nausea   Emesis   Diarrhea    Brief narrative: Deona Novitski is a 86 y.o. female with PMH significant for HTN, PAD s/p SMA stent, COPD, GERD.  Patient presented to the ED on 8/17 with concern of abdominal pain, nausea, vomiting and diarrhea.  In the ED, patient was hypotensive, hypoxic. Labs showed COVID-positive.   Imaging showed evidence of left-sided colitis, severe generalized atherosclerosis without occlusion Patient admitted to hospital service for further evaluation management  Subjective: Patient was seen and examined this morning.   Lying on bed.  Not in distress.  No new symptoms.  Diarrhea nausea vomiting improving.   Pending PT eval.  Assessment/Plan: Acute gastroenteritis Left-sided colitis  Presented with severe abdomen pain, nausea, vomiting, diarrhea CT abdomen with findings of left-sided colitis.  Patient may also have COVID-related GI symptoms.  Is clinically undetermined if left-sided colitis is related to COVID.   GI pathogen panel negative.  Not on antibiotics. Diarrhea, nausea, vomiting improved.  Diet advanced to soft today.  Can stop IV fluid.  COVID infection Acute respiratory with hypoxia COVID PCR positive. No evidence of pneumonia. She was noted to have a low oxygen saturation of 70s transiently in the ED.  Placed on 2 L oxygen.  No infiltrate on chest x-ray.   Patient has been started on Paxlovid.  Continue supplemental oxygen.  Wean down as tolerated.   AKI on CKD Patient was dehydrated and had AKI on presentation.  Improved with IV hydration. Recent Labs    01/29/21 0637 01/30/21 0514 01/31/21 0410  BUN 23 10 6*  CREATININE 1.12* 0.69 0.58   Peripheral artery disease S/p SMA stent in the past CT angio shows severe atherosclerosis  as well as advanced bilateral proximal subclavian artery stenosis, moderate/advanced left common carotid origin stenosis. Continue aspirin, Plavix.  Not on statin Lipid panel with HDL 26, LDL 73.  Follow-up with vascular surgery as an outpatient.   Essential hypertension Home meds include amlodipine 5 mg daily, losartan 100 mg daily.  Currently on hold.  Blood pressure elevated to 148/98 this morning.  Will resume both starting tomorrow.  COPD Current everyday smoker Smokes 1 pack/day.  Counseled to quit.   GERD Protonix  Hypothyroidism Resume Synthroid.   Memory impairment Namenda   Restless legs, peripheral neuropathy On Neurontin, Flexeril, pramipexole.  Mobility: Pending PT eval Code Status:   Code Status: Full Code  Nutritional status: Body mass index is 23.05 kg/m.     Diet:  Diet Order             DIET DYS 3 Room service appropriate? Yes; Fluid consistency: Thin  Diet effective now                  DVT prophylaxis:  enoxaparin (LOVENOX) injection 30 mg Start: 01/29/21 2200   Antimicrobials: None Fluid: Can stop IV fluid. Consultants: None Family Communication: None at bedside  Status is: Inpatient  Remains inpatient appropriate because: Pending PT eval, oxygen requirement check  Dispo: The patient is from: Home              Anticipated d/c is to: Home probably with home health in 1 to 2 days  Patient currently is not medically stable to d/c.   Difficult to place patient No     Infusions:   sodium chloride 75 mL/hr at 01/30/21 2224    Scheduled Meds:  albuterol  2 puff Inhalation Q6H   amLODipine  5 mg Oral Daily   aspirin EC  81 mg Oral Daily   clopidogrel  75 mg Oral Daily   enoxaparin (LOVENOX) injection  30 mg Subcutaneous Q24H   gabapentin  800 mg Oral TID   levothyroxine  75 mcg Oral QAC breakfast   losartan  100 mg Oral Daily   memantine  5 mg Oral BID   nicotine  7 mg Transdermal Daily   nirmatrelvir/ritonavir EUA  (renal dosing)  2 tablet Oral BID   pantoprazole  40 mg Oral Daily   pramipexole  0.25 mg Oral QHS    Antimicrobials: Anti-infectives (From admission, onward)    Start     Dose/Rate Route Frequency Ordered Stop   01/29/21 1300  nirmatrelvir/ritonavir EUA (renal dosing) (PAXLOVID) TABS 2 tablet        2 tablet Oral 2 times daily 01/29/21 1158 02/03/21 0959       PRN meds: acetaminophen **OR** acetaminophen, cyclobenzaprine, hydrALAZINE, HYDROcodone-acetaminophen, morphine injection, ondansetron **OR** ondansetron (ZOFRAN) IV, phenol   Objective: Vitals:   01/31/21 0604 01/31/21 1120  BP: 135/73 (!) 148/93  Pulse: 74 72  Resp: 19 18  Temp:  98.6 F (37 C)  SpO2: 95% 95%    Intake/Output Summary (Last 24 hours) at 01/31/2021 1357 Last data filed at 01/31/2021 1127 Gross per 24 hour  Intake 614.65 ml  Output 5050 ml  Net -4435.35 ml   Filed Weights   01/29/21 0613  Weight: 55.3 kg   Weight change:  Body mass index is 23.05 kg/m.   Physical Exam: General exam: Pleasant, elderly Caucasian female.  Not in physical distress Skin: No rashes, lesions or ulcers. HEENT: Atraumatic, normocephalic, no obvious bleeding Lungs: Diminished air entry in both bases.  Otherwise clear to auscultation bilaterally CVS: Regular rate and rhythm, no murmur GI/Abd soft, nontender, nondistended, bowel sound present CNS: Alert, awake, oriented x3 Psychiatry: Mood appropriate Extremities: No pedal edema, no calf tenderness  Data Review: I have personally reviewed the laboratory data and studies available.  Recent Labs  Lab 01/29/21 0637 01/30/21 0514 01/31/21 0410  WBC 14.0* 8.3 7.9  HGB 12.1 11.7* 12.6  HCT 37.0 35.7* 38.5  MCV 90.9 90.4 91.0  PLT 256 226 239   Recent Labs  Lab 01/29/21 0637 01/30/21 0514 01/31/21 0410  NA 130* 126* 133*  K 3.9 4.1 4.3  CL 94* 94* 100  CO2 27 26 26   GLUCOSE 133* 100* 92  BUN 23 10 6*  CREATININE 1.12* 0.69 0.58  CALCIUM 8.2* 8.1* 8.4*     F/u labs ordered Unresulted Labs (From admission, onward)     Start     Ordered   01/30/21 0500  Basic metabolic panel  Daily,   R      01/29/21 1158   01/30/21 0500  CBC  Daily,   R      01/29/21 1158            Signed, 01/31/21, MD [facility] 01/31/2021

## 2021-02-01 ENCOUNTER — Encounter (HOSPITAL_COMMUNITY): Payer: Self-pay | Admitting: Internal Medicine

## 2021-02-01 DIAGNOSIS — E039 Hypothyroidism, unspecified: Secondary | ICD-10-CM

## 2021-02-01 DIAGNOSIS — N179 Acute kidney failure, unspecified: Secondary | ICD-10-CM

## 2021-02-01 DIAGNOSIS — E871 Hypo-osmolality and hyponatremia: Secondary | ICD-10-CM

## 2021-02-01 DIAGNOSIS — R413 Other amnesia: Secondary | ICD-10-CM

## 2021-02-01 DIAGNOSIS — F17210 Nicotine dependence, cigarettes, uncomplicated: Secondary | ICD-10-CM

## 2021-02-01 DIAGNOSIS — G2581 Restless legs syndrome: Secondary | ICD-10-CM

## 2021-02-01 DIAGNOSIS — I10 Essential (primary) hypertension: Secondary | ICD-10-CM

## 2021-02-01 DIAGNOSIS — J449 Chronic obstructive pulmonary disease, unspecified: Secondary | ICD-10-CM

## 2021-02-01 DIAGNOSIS — I739 Peripheral vascular disease, unspecified: Secondary | ICD-10-CM

## 2021-02-01 DIAGNOSIS — U071 COVID-19: Secondary | ICD-10-CM

## 2021-02-01 LAB — BASIC METABOLIC PANEL
Anion gap: 7 (ref 5–15)
BUN: 11 mg/dL (ref 8–23)
CO2: 28 mmol/L (ref 22–32)
Calcium: 8.7 mg/dL — ABNORMAL LOW (ref 8.9–10.3)
Chloride: 96 mmol/L — ABNORMAL LOW (ref 98–111)
Creatinine, Ser: 0.78 mg/dL (ref 0.44–1.00)
GFR, Estimated: >60 mL/min (ref >60)
Glucose, Bld: 98 mg/dL (ref 70–99)
Potassium: 4.2 mmol/L (ref 3.5–5.1)
Sodium: 131 mmol/L — ABNORMAL LOW (ref 135–145)

## 2021-02-01 LAB — CBC
HCT: 39.6 % (ref 36.0–46.0)
Hemoglobin: 12.8 g/dL (ref 12.0–15.0)
MCH: 29.5 pg (ref 26.0–34.0)
MCHC: 32.3 g/dL (ref 30.0–36.0)
MCV: 91.2 fL (ref 80.0–100.0)
Platelets: 252 10*3/uL (ref 150–400)
RBC: 4.34 MIL/uL (ref 3.87–5.11)
RDW: 13.4 % (ref 11.5–15.5)
WBC: 7.1 10*3/uL (ref 4.0–10.5)
nRBC: 0 % (ref 0.0–0.2)

## 2021-02-01 MED ORDER — ALBUTEROL SULFATE HFA 108 (90 BASE) MCG/ACT IN AERS: 2.0000 | INHALATION_SPRAY | Freq: Four times a day (QID) | RESPIRATORY_TRACT | Status: DC | PRN

## 2021-02-01 MED ORDER — MELATONIN 3 MG PO TABS
3.0000 mg | ORAL_TABLET | Freq: Every day | ORAL | Status: DC
  Administered 2021-02-02: 3 mg via ORAL
  Filled 2021-02-01: qty 1

## 2021-02-01 MED ORDER — ENOXAPARIN SODIUM 40 MG/0.4ML IJ SOSY
40.0000 mg | PREFILLED_SYRINGE | INTRAMUSCULAR | Status: DC
  Administered 2021-02-01: 40 mg via SUBCUTANEOUS
  Filled 2021-02-01: qty 0.4

## 2021-02-01 NOTE — Assessment & Plan Note (Signed)
continue levothyroxine

## 2021-02-01 NOTE — Hospital Course (Addendum)
86 year old woman presented with abdominal pain nausea, vomiting and diarrhea.  Found to be COVID-positive.  Admitted for left-sided colitis.

## 2021-02-01 NOTE — Progress Notes (Signed)
PROGRESS NOTE  Amy Mckay TLX:[id number] DOB: Jul 03, 1934 DOA: 01/29/2021 PCP: [name], MD  Brief History   86 year old woman presented with abdominal pain nausea, vomiting and diarrhea.  Found to be COVID-positive.  Admitted for left-sided colitis.  A & P  * Colitis -- Seen on CT.  Acute in nature, now resolved.  No pain nausea or vomiting.  Tolerating diet.  Unclear whether related to COVID or not. -- Resolved at this point.  COVID-19 virus infection -- No evidence of pneumonia.  Tach transient hypoxia, does not qualify for home oxygen.  Being treated with Paxil elevated, completes tomorrow.  Hyponatremia --appears chronic, stable, asymptomatic, mild. Supportive care.  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 Refuses HH. Home tomorrow  Status is: Inpatient  Remains inpatient appropriate because:Inpatient level of care appropriate due to severity of illness  Dispo: The patient is from: Home              Anticipated d/c is to: Home              Patient currently is not medically stable to d/c.   Difficult to place patient No  DVT prophylaxis: enoxaparin (LOVENOX) injection 40 mg Start: 02/01/21 2200   Code Status: Full Code Level of care: Progressive Family Communication: none  Brendia Sacks, MD  [facility] Direct contact: see [email or web address] (further directions at bottom of note if needed) 7PM-7AM contact night coverage as at bottom of note 02/01/2021, 5:18 PM  LOS: 3 days   Interval History/Subjective  CC: f/u colitis  No n/v. Tolerating diet. No diarrhea. No pain. Breathing ok. No complaints.  Objective   Vitals:  Vitals:   02/01/21 0245 02/01/21 1237  BP: 97/79 109/72  Pulse: 74 76  Resp: 12 18  Temp: 98.1 F (36.7 C) 98.6 F (37 C)  SpO2: 95% (!) 88%    Exam: Physical Exam Vitals reviewed.  Constitutional:      Appearance: Normal appearance.  Cardiovascular:     Rate and Rhythm: Normal rate and regular rhythm.     Heart sounds: No murmur heard. Pulmonary:     Effort: Pulmonary effort is normal. No respiratory distress.     Breath sounds: Normal breath sounds. No wheezing or rales.  Neurological:     Mental Status: She is alert.  Psychiatric:        Mood and Affect: Mood normal.        Behavior: Behavior normal.    I have personally reviewed the labs and other data, making special note of:   Today's Data  Na stable 131 CBC stable  Scheduled Meds:  amLODipine  5 mg Oral Daily   aspirin EC  81 mg Oral Daily   clopidogrel  75 mg Oral Daily   enoxaparin (LOVENOX) injection  40 mg Subcutaneous Q24H   gabapentin  800 mg Oral TID   levothyroxine  75 mcg Oral QAC breakfast   losartan  100 mg Oral Daily  memantine  5 mg Oral BID   nirmatrelvir/ritonavir EUA (renal dosing)  2 tablet Oral BID   pantoprazole  40 mg Oral Daily   pramipexole  0.25 mg Oral QHS   Continuous Infusions:  Principal Problem:   Colitis Active Problems:   COVID-19 virus infection   Hyponatremia   AKI (acute kidney injury) (HCC)   Peripheral arterial disease (HCC)   Benign essential HTN   COPD (chronic obstructive pulmonary disease) (HCC)   Cigarette smoker   Hypothyroidism   Memory impairment   Restless leg   LOS: 3 days   How to contact the [facility] Attending or  Consulting provider 7A - 7P or covering provider during after hours 7P -7A, for this patient?  Check the care team in [facility] and look for a) attending/consulting TRH provider listed and b) the [facility] team listed Log into [email or web address] and use [facility] universal password to access. If you do not have the password, please contact the hospital operator. Locate the [facility] provider you are looking for under [facility] and page to a number that you can be directly reached. If you still have difficulty reaching the provider, please page the [facility] (Director on Call) for the Hospitalists listed on amion for assistance.

## 2021-02-01 NOTE — Assessment & Plan Note (Signed)
Namenda 

## 2021-02-01 NOTE — Assessment & Plan Note (Signed)
--  resolved, secondary to acute illness

## 2021-02-01 NOTE — Assessment & Plan Note (Signed)
--  appears chronic, stable, asymptomatic, mild. Supportive care.

## 2021-02-01 NOTE — TOC Progression Note (Signed)
Transition of Care [facility]) - Progression Note    Patient Details  Name: Amy Mckay MRN: [id number] Date of Birth: 1935-02-02  [facility]) CM/SW Contact  Geni Bers, RN Phone Number: 02/01/2021, 1:51 PM  Clinical Narrative:     Pt declined HHPT at present time. Pt states that her helping her find someone to help with cleaning up. Pt states that she walk around the house, will not need HHPT. Encouraged pt to call her PCP if you change her mind when she is at home.   Expected Discharge Plan: Home/Self Care Barriers to Discharge: No Barriers Identified  Expected Discharge Plan and Services Expected Discharge Plan: Home/Self Care       Living arrangements for the past 2 months: Single Family Home                                       Social Determinants of Health (SDOH) Interventions    Readmission Risk Interventions No flowsheet data found.

## 2021-02-01 NOTE — Assessment & Plan Note (Signed)
--   No evidence of pneumonia.  Tach transient hypoxia, does not qualify for home oxygen.  Being treated with Paxil elevated, completes tomorrow.

## 2021-02-01 NOTE — Assessment & Plan Note (Signed)
recommend cessation

## 2021-02-01 NOTE — Plan of Care (Signed)
  Problem: Clinical Measurements: Goal: Diagnostic test results will improve Outcome: Progressing   

## 2021-02-01 NOTE — Assessment & Plan Note (Signed)
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

## 2021-02-01 NOTE — Assessment & Plan Note (Signed)
--  stable, continue amlodipine, losartan

## 2021-02-01 NOTE — Assessment & Plan Note (Signed)
--  stable, continue albuterol PRN

## 2021-02-01 NOTE — Assessment & Plan Note (Signed)
--   Seen on CT.  Acute in nature, now resolved.  No pain nausea or vomiting.  Tolerating diet.  Unclear whether related to COVID or not. -- Resolved at this point.

## 2021-02-01 NOTE — Assessment & Plan Note (Signed)
--  continue pramipexole

## 2021-02-02 DIAGNOSIS — J449 Chronic obstructive pulmonary disease, unspecified: Secondary | ICD-10-CM

## 2021-02-02 DIAGNOSIS — J9621 Acute and chronic respiratory failure with hypoxia: Secondary | ICD-10-CM

## 2021-02-02 MED ORDER — CALCIUM CARBONATE ANTACID 500 MG PO CHEW
1.0000 | CHEWABLE_TABLET | Freq: Once | ORAL | Status: AC
  Administered 2021-02-02: 200 mg via ORAL
  Filled 2021-02-02: qty 1

## 2021-02-02 MED ORDER — NIRMATRELVIR/RITONAVIR (PAXLOVID) TABLET (RENAL DOSING): 2.0000 | ORAL_TABLET | Freq: Two times a day (BID) | ORAL | 0 refills | Status: AC

## 2021-02-02 NOTE — TOC Progression Note (Signed)
Transition of Care [facility]) - Progression Note    Patient Details  Name: Amy Mckay MRN: [id number] Date of Birth: 1935/01/01  Transition of Care [facility]) CM/SW Contact  Marina Goodell Phone Number: [phone] 02/02/2021, 1:24 PM  Clinical Narrative:     Patient SATs below 88% while ambulating.  Attending requested O2 for home.  CSW spoke with Curlene Labrum (Daughter) [phone] [facility] Phone) and updated on DME O2 request.  CSW contacted Jermaine w/ [employer] who will confirm with this CSW once O2 is delivered to patient.  Expected Discharge Plan: Home/Self Care Barriers to Discharge: No Barriers Identified  Expected Discharge Plan and Services Expected Discharge Plan: Home/Self Care       Living arrangements for the past 2 months: Single Family Home                                       Social Determinants of Health (SDOH) Interventions    Readmission Risk Interventions No flowsheet data found.

## 2021-02-02 NOTE — TOC Transition Note (Addendum)
Transition of Care [facility]) - CM/SW Discharge Note   Patient Details  Name: Amy Mckay MRN: [id number] Date of Birth: 08-03-1934  Transition of Care [facility]) CM/SW Contact:  Marina Goodell Phone Number: [phone] 02/02/2021, 1:56 PM   Clinical Narrative:     TOC will d/c home with 2L O2 supplied by [employer]. Jermaine w/ [employer] confirmed O2 delivery to patient's room.  CSW updated patient's granddaughter Trish Fountain [phone] who will arrive in 30 minutes to pick up the patient.  Attending and Unit RN updated.  Final next level of care: Home/Self Care Barriers to Discharge: No Barriers Identified   Patient Goals and CMS Choice Patient states their goals for this hospitalization and ongoing recovery are:: To go home CMS Medicare.gov Compare Post Acute Care list provided to:: Patient Choice offered to / list presented to : Patient  Discharge Placement                       Discharge Plan and Services                                     Social Determinants of Health (SDOH) Interventions     Readmission Risk Interventions No flowsheet data found.

## 2021-02-02 NOTE — Discharge Summary (Signed)
Physician Discharge Summary  Amy Mckay ZOX:[id number] DOB: 02-Mar-1935 DOA: 01/29/2021  PCP: Amy Clos, MD  Admit date: 01/29/2021 Discharge date: 02/02/2021  Recommendations for Outpatient Follow-up:   COPD (chronic obstructive pulmonary disease) (HCC) --stable, continue albuterol PRN --referred to pulmonology  Hyponatremia --appears chronic, stable, asymptomatic, mild. Supportive care.  Peripheral arterial disease (HCC) -- S/p SMA stent in the past, CT showed severe atherosclerosis as well as proximal subclavian artery stenosis -- Continue aspirin, Plavix -- Consider statin --Follow-up with vascular surgery as an outpatient is suggested   Follow-up Information     Amy Clos, MD. Schedule an appointment as soon as possible for a visit in 1 week(s).   Specialty: Family Medicine Contact information: [address] [place] [address] [phone]                  Discharge Diagnoses: Principal diagnosis is #1 Principal Problem:   Colitis Active Problems:   COVID-19 virus infection   Hyponatremia   AKI (acute kidney injury) (HCC)   Peripheral arterial disease (HCC)   Benign essential HTN   COPD (chronic obstructive pulmonary disease) (HCC)   Cigarette smoker   Hypothyroidism   Memory impairment   Restless leg   Acute on chronic respiratory failure with hypoxia (HCC)   Discharge Condition: improved Disposition: home  Diet recommendation:  Diet Orders (From admission, onward)     Start     Ordered   02/02/21 0000  Diet general        02/02/21 1404   01/31/21 1205  DIET DYS 3 Room service appropriate? Yes; Fluid consistency: Thin  Diet effective now       Question Answer Comment  Room service appropriate? Yes   Fluid consistency: Thin      01/31/21 1204             Filed Weights   01/29/21 0613  Weight: 55.3 kg    HPI/Hospital Course:   86 year old woman presented with abdominal pain nausea, vomiting and diarrhea.   Found to be COVID-positive.  Admitted for left-sided colitis.  Gradually improved with supportive care, symptoms resolved at this point.  Also found to have COVID-19, no evidence of pneumonia, intermittent hypoxia noted, at night as well as during the day today.  Did not require treatment for COVID, was treated with Paxlovid. Longtime smoker with history of COPD.  Suspect she has had chronic hypoxia at home.  Qualifies for oxygen.   * Colitis -- Seen on CT.   No pain nausea or vomiting.  Tolerating diet.  Unclear whether related to COVID or not. -- Resolved at this point.  COVID-19 virus infection -- No evidence of pneumonia.  Intermittent hypoxia probably related to COPD rather than COVID.  Acute on chronic hypoxic respiratory failure --Suspect chronic component given longtime smoking and history of COPD.  Now qualifies for oxygen.  Outpatient follow-up with PCP.  Consider referral to pulmonology as an outpatient.  COPD (chronic obstructive pulmonary disease) (HCC) --stable, continue albuterol PRN  Hyponatremia --appears chronic, stable, asymptomatic, mild. Supportive care.  Restless leg --continue pramipexole  Memory impairment --Namenda  Hypothyroidism --continue levothyroxine  Cigarette smoker --recommend cessation  Benign essential HTN --stable, continue amlodipine, losartan  Peripheral arterial disease (HCC) -- S/p SMA stent in the past, CT showed severe atherosclerosis as well as proximal subclavian artery stenosis -- Continue aspirin, Plavix -- Consider statin --Follow-up with vascular surgery as an outpatient  AKI (acute kidney injury) (HCC) --resolved, secondary to acute illness  Today's assessment: S: CC: f/u colitis  Feels good no complaints Eating well Per RN pt hypoxic overnight to low 80s Pt long time smoker   O: Vitals:  Vitals:   02/02/21 0723 02/02/21 1302  BP:  (!) 103/57  Pulse:  74  Resp:  18  Temp:    SpO2: (!) 84% (!) 81%     Constitutional:  Appears calm and comfortable Respiratory:  CTA bilaterally, few wheezes Respiratory effort normal.  Speaks in long and full sentences Cardiovascular:  RRR, no m/r/g No LE extremity edema   Psychiatric:  Mental status Mood, affect appropriate  No new labs  Discharge Instructions  Discharge Instructions     Diet general   Complete by: As directed    Discharge instructions   Complete by: As directed    Call your physician or seek immediate medical attention for pain, fever, shortness of breath or worsening of conditoin.   Increase activity slowly   Complete by: As directed       Allergies as of 02/02/2021       Reactions   Penicillins Swelling        Medication List     TAKE these medications    albuterol 108 (90 Base) MCG/ACT inhaler Commonly known as: VENTOLIN HFA Inhale 2 puffs into the lungs daily as needed for shortness of breath.   amLODipine 5 MG tablet Commonly known as: NORVASC Take 5 mg by mouth daily.   aspirin EC 81 MG tablet Take 81 mg by mouth daily.   clopidogrel 75 MG tablet Commonly known as: PLAVIX Take 75 mg by mouth daily.   cyclobenzaprine 5 MG tablet Commonly known as: FLEXERIL Take 5 mg by mouth 3 (three) times daily as needed for muscle spasms.   esomeprazole 40 MG capsule Commonly known as: NEXIUM Take 40 mg by mouth 2 (two) times daily.   gabapentin 800 MG tablet Commonly known as: NEURONTIN Take 800 mg by mouth 3 (three) times daily.   ibuprofen 200 MG tablet Commonly known as: ADVIL Take 200 mg by mouth every 6 (six) hours as needed for fever, headache or mild pain.   levothyroxine 75 MCG tablet Commonly known as: SYNTHROID Take 75 mcg by mouth daily before breakfast.   losartan 100 MG tablet Commonly known as: COZAAR Take 100 mg by mouth daily.   MAGNESIUM PO Take 1 tablet by mouth daily.   memantine 5 MG tablet Commonly known as: NAMENDA Take 5 mg by mouth 2 (two) times daily.    multivitamin with minerals Tabs tablet Take 1 tablet by mouth daily.   nirmatrelvir/ritonavir EUA (renal dosing) 10 x 150 MG & 10 x  Tabs Commonly known as: PAXLOVID Take 2 tablets by mouth 2 (two) times daily for 1 dose. Patient GFR is >60.   oxyCODONE 5 MG immediate release tablet Commonly known as: Oxy IR/ROXICODONE Take 5 mg by mouth every 4 (four) hours as needed for pain.   pramipexole 0.25 MG tablet Commonly known as: MIRAPEX Take 0.25 mg by mouth at bedtime.   VITAMIN C PO Take 1 tablet by mouth daily.               Durable Medical Equipment  (From admission, onward)           Start     Ordered   02/02/21 1258  For home use only DME oxygen  Once       Question Answer Comment  Length of Need Lifetime   Mode  or (Route) Nasal cannula   Liters per Minute 2   Frequency Continuous (stationary and portable oxygen unit needed)   Oxygen delivery system Gas      02/02/21 1257           Allergies  Allergen Reactions   Penicillins Swelling    The results of significant diagnostics from this hospitalization (including imaging, microbiology, ancillary and laboratory) are listed below for reference.    Significant Diagnostic Studies: CT HEAD WO CONTRAST (5MM)  Result Date: 01/29/2021 CLINICAL DATA:  Facial trauma EXAM: CT HEAD WITHOUT CONTRAST TECHNIQUE: Contiguous axial images were obtained from the base of the skull through the vertex without intravenous contrast. COMPARISON:  CT head 05/14/2016 FINDINGS: Brain: There is no evidence of acute intracranial hemorrhage, extra-axial fluid collection, or infarct. The CSF spaces overlying the frontal lobes are prominent, unchanged. The ventricles are enlarged. No mass lesion is identified. There is no midline shift. Vascular: There is calcification of the bilateral cavernous ICAs. Skull: Normal. Negative for fracture or focal lesion. Sinuses/Orbits: The imaged paranasal sinuses are clear. Bilateral lens implants  are in place. The globes and orbits are otherwise unremarkable. Other: None. IMPRESSION: No acute intracranial hemorrhage or calvarial fracture. Electronically Signed   By: Lesia HausenPeter  Noone M.D.   On: 01/29/2021 10:13   DG Chest Port 1 View  Result Date: 01/29/2021 CLINICAL DATA:  Fall. EXAM: PORTABLE CHEST 1 VIEW COMPARISON:  Chest x-ray dated Oct 22, 2016. FINDINGS: The heart size and mediastinal contours are within normal limits. Both lungs are clear. The visualized skeletal structures are unremarkable. IMPRESSION: No active disease. Electronically Signed   By: Obie DredgeWilliam T Derry M.D.   On: 01/29/2021 06:59   CT Angio Chest/Abd/Pel for Dissection W and/or Wo Contrast  Result Date: 01/29/2021 CLINICAL DATA:  Abdominal pain with aortic dissection suspected. History of fall and diarrhea EXAM: CT ANGIOGRAPHY CHEST, ABDOMEN AND PELVIS TECHNIQUE: Non-contrast CT of the chest was initially obtained. Multidetector CT imaging through the chest, abdomen and pelvis was performed using the standard protocol during bolus administration of intravenous contrast. Multiplanar reconstructed images and MIPs were obtained and reviewed to evaluate the vascular anatomy. CONTRAST:  75mL OMNIPAQUE IOHEXOL 350 MG/ML SOLN COMPARISON:  10/22/2016 chest CTA FINDINGS: CTA CHEST FINDINGS Cardiovascular: Preferential opacification of the thoracic aorta. No evidence of thoracic aortic aneurysm or dissection. Normal heart size. No pericardial effusion. Extensive irregular atheromatous plaque involving the aorta, especially arch and distal segment. Irregular plaque involves the great vessels with severe narrowings of both proximal subclavian arteries. Potentially flow reducing stenosis at the left common carotid origin, limited by motion artifact and calcified plaque density. Mediastinum/Nodes: Negative for adenopathy or mass Lungs/Pleura: Generalized airway thickening. There is no edema, consolidation, effusion, or pneumothorax. Flat/triangular  nodule measuring 6 x 2 mm in the subpleural right lower lobe along the lower major fissure. The morphology is most consistent with lymph node. No worrisome pulmonary nodule. Musculoskeletal: No acute finding. Review of the MIP images confirms the above findings. CTA ABDOMEN AND PELVIS FINDINGS VASCULAR Aorta: Severe and diffuse atheromatous plaque. No aneurysm or dissection. No evidence of mural inflammation Celiac: Patent ostial stent. Atherosclerosis of major branches. No acute finding or proximal stenosis SMA: Ostial stenting which is patent. No acute finding such is branch occlusion. Renals: Extensive atheromatous plaque bilaterally with high-grade narrowing on the left but symmetric renal enhancement. IMA: Patent, as is the left colic artery Inflow: Confluent atheromatous plaque bilaterally. No aneurysm, dissection, or focal/flow limiting stenosis. Veins: A venous phase  was not acquired. Review of the MIP images confirms the above findings. NON-VASCULAR Hepatobiliary: No hepatic injury or perihepatic hematoma. Gallbladder is unremarkable Pancreas: Negative Spleen: No splenic injury or perisplenic hematoma. Adrenals/Urinary Tract: No adrenal hemorrhage or renal injury identified. Bladder is unremarkable. Lobulated renal cortex from scarring. Stomach/Bowel: Submucosal low-density thickening of the left colon (distal transverse to distal descending) with mild pericolonic fat stranding. No obstruction. Left colonic diverticulosis without focal inflammation. Lymphatic: Negative for adenopathy or mass. Reproductive: Hysterectomy Other: No ascites or pneumoperitoneum Musculoskeletal: Advanced lumbar spine degeneration with levoscoliosis. No acute finding Review of the MIP images confirms the above findings. IMPRESSION: 1. Left-sided colitis. Atherosclerosis is severe and there has been prior SMA stenting, but no underlying occlusive disease is noted. 2. Advanced bilateral proximal subclavian artery stenosis. Moderate  or advanced left common carotid origin stenosis. Electronically Signed   By: Marnee Spring M.D.   On: 01/29/2021 10:31    Microbiology: Recent Results (from the past 240 hour(s))  Blood culture (routine x 2)     Status: None (Preliminary result)   Collection Time: 01/29/21  6:37 AM   Specimen: BLOOD  Result Value Ref Range Status   Specimen Description   Final    BLOOD LEFT ANTECUBITAL Performed at [facility], [address], [place], [place] [address]    Special Requests   Final    BOTTLES DRAWN AEROBIC AND ANAEROBIC Blood Culture results may not be optimal due to an inadequate volume of blood received in culture bottles Performed at [facility], [address], [place], [place] [address]    Culture   Final    NO GROWTH 4 DAYS Performed at [facility], [address], [place], [place] [address]    Report Status PENDING  Incomplete  Blood culture (routine x 2)     Status: None (Preliminary result)   Collection Time: 01/29/21  6:37 AM   Specimen: BLOOD  Result Value Ref Range Status   Specimen Description   Final    BLOOD BLOOD LEFT FOREARM Performed at [facility], [address], [place], [place] [address]    Special Requests   Final    BOTTLES DRAWN AEROBIC AND ANAEROBIC Blood Culture results may not be optimal due to an inadequate volume of blood received in culture bottles Performed at [facility], [address], [place], [place] [address]    Culture   Final    NO GROWTH 4 DAYS Performed at [facility], [address], [place], [place] [address]    Report Status PENDING  Incomplete  Resp Panel by RT-PCR (Flu A&B, Covid) Nasopharyngeal Swab     Status: Abnormal   Collection Time: 01/29/21  6:39 AM   Specimen: Nasopharyngeal Swab; Nasopharyngeal(NP) swabs in vial transport medium  Result Value Ref Range Status   SARS Coronavirus 2 by RT PCR POSITIVE (A) NEGATIVE Final     Comment: RESULT CALLED TO, READ BACK BY AND VERIFIED WITH: GILLEY M. ON 01/29/2021 @ 0749 BY MECIAL J. (NOTE) SARS-CoV-2 target nucleic acids are DETECTED.  The SARS-CoV-2 RNA is generally detectable in upper respiratory specimens during the acute phase of infection. Positive results are indicative of the presence of the identified virus, but do not rule out bacterial infection or co-infection with other pathogens not detected by the test. Clinical correlation with patient history and other diagnostic information is necessary to determine patient infection status. The expected result is Negative.  Fact Sheet for Patients: BloggerCourse.com  Fact Sheet for Healthcare Providers: SeriousBroker.it  This test is not yet approved or cleared by the [place] FDA and  has been authorized for detection and/or diagnosis of SARS-CoV-2 by FDA under an Emergency Use Authorization (EUA).  This EUA will remain in effect (meaning this t est can be used) for the duration of  the COVID-19 declaration under Section 564(b)(1) of the Act, 21 U.S.C. section 360bbb-3(b)(1), unless the authorization is terminated or revoked sooner.     Influenza A by PCR NEGATIVE NEGATIVE Final   Influenza B by PCR NEGATIVE NEGATIVE Final    Comment: (NOTE) The Xpert Xpress SARS-CoV-2/FLU/RSV plus assay is intended as an aid in the diagnosis of influenza from Nasopharyngeal swab specimens and should not be used as a sole basis for treatment. Nasal washings and aspirates are unacceptable for Xpert Xpress SARS-CoV-2/FLU/RSV testing.  Fact Sheet for Patients: BloggerCourse.com  Fact Sheet for Healthcare Providers: SeriousBroker.it  This test is not yet approved or cleared by the [place] FDA and has been authorized for detection and/or diagnosis of SARS-CoV-2 by FDA under an Emergency Use Authorization (EUA).  This EUA will remain in effect (meaning this test can be used) for the duration of the COVID-19 declaration under Section 564(b)(1) of the Act, 21 U.S.C. section 360bbb-3(b)(1), unless the authorization is terminated or revoked.  Performed at [facility], [address], [place], [place] [address]   Gastrointestinal Panel by PCR , Stool     Status: None   Collection Time: 01/29/21  5:16 PM   Specimen: Stool  Result Value Ref Range Status   Campylobacter species NOT DETECTED NOT DETECTED Final   Plesimonas shigelloides NOT DETECTED NOT DETECTED Final   Salmonella species NOT DETECTED NOT DETECTED Final   Yersinia enterocolitica NOT DETECTED NOT DETECTED Final   Vibrio species NOT DETECTED NOT DETECTED Final   Vibrio cholerae NOT DETECTED NOT DETECTED Final   Enteroaggregative E coli (EAEC) NOT DETECTED NOT DETECTED Final   Enteropathogenic E coli (EPEC) NOT DETECTED NOT DETECTED Final   Enterotoxigenic E coli (ETEC) NOT DETECTED NOT DETECTED Final   Shiga like toxin producing E coli (STEC) NOT DETECTED NOT DETECTED Final   Shigella/Enteroinvasive E coli (EIEC) NOT DETECTED NOT DETECTED Final   Cryptosporidium NOT DETECTED NOT DETECTED Final   Cyclospora cayetanensis NOT DETECTED NOT DETECTED Final   Entamoeba histolytica NOT DETECTED NOT DETECTED Final   Giardia lamblia NOT DETECTED NOT DETECTED Final   Adenovirus F40/41 NOT DETECTED NOT DETECTED Final   Astrovirus NOT DETECTED NOT DETECTED Final   Norovirus GI/GII NOT DETECTED NOT DETECTED Final   Rotavirus A NOT DETECTED NOT DETECTED Final   Sapovirus (I, II, IV, and V) NOT DETECTED NOT DETECTED Final    Comment: Performed at [facility], [address] [place], [place] [address]     Labs: Basic Metabolic Panel: Recent Labs  Lab 01/29/21 0637 01/30/21 0514 01/31/21 0410 02/01/21 0420  NA 130* 126* 133* 131*  K 3.9 4.1 4.3 4.2  CL 94* 94* 100 96*  CO2 27 26 26 28   GLUCOSE 133* 100* 92 98   BUN 23 10 6* 11  CREATININE 1.12* 0.69 0.58 0.78  CALCIUM 8.2* 8.1* 8.4* 8.7*   Liver Function Tests: Recent Labs  Lab 01/29/21 0637  AST 25  ALT 18  ALKPHOS 68  BILITOT 0.4  PROT 6.1*  ALBUMIN 3.3*   Recent Labs  Lab 01/29/21 0637  LIPASE 42  CBC: Recent Labs  Lab 01/29/21 0637 01/30/21 0514 01/31/21 0410 02/01/21 0420  WBC 14.0* 8.3 7.9 7.1  HGB 12.1 11.7* 12.6 12.8  HCT 37.0 35.7* 38.5 39.6  MCV 90.9 90.4 91.0 91.2  PLT 256 226 239 252     Principal Problem:   Colitis Active Problems:   COVID-19 virus infection   Hyponatremia   AKI (acute kidney injury) (HCC)   Peripheral arterial disease (HCC)   Benign essential HTN   COPD (chronic obstructive pulmonary disease) (HCC)   Cigarette smoker   Hypothyroidism   Memory impairment   Restless leg   Acute on chronic respiratory failure with hypoxia (HCC)   Time coordinating discharge: 35 minutes  Signed:  Brendia Sacks, MD  [facility]  02/02/2021, 2:05 PM

## 2021-02-02 NOTE — Progress Notes (Signed)
While ambulating, 82 was the lowest I saw she got on RA. Resting on RA, she ranges from 90-95% . 93-97% on 2LNC. Could not get a good finger reading. She did get nauseated. I had to get her IV zofran. I called her daughter, she did not answer. She said she gets nauseated at home too, and she keeps a waste basket close by in case it happens. Dr. Irene Limbo made aware.

## 2021-02-02 NOTE — Progress Notes (Signed)
SATURATION QUALIFICATIONS: (This note is used to comply with regulatory documentation for home oxygen)  Patient Saturations on Room Air at Rest = 90-95%  Patient Saturations on Room Air while Ambulating = 82-84%  Patient Saturations on 2  Liters of oxygen while Ambulating = 90-92 %  Please briefly explain why patient needs home oxygen:

## 2021-02-03 LAB — CULTURE, BLOOD (ROUTINE X 2)
Culture: NO GROWTH
Culture: NO GROWTH

## 2021-04-17 ENCOUNTER — Other Ambulatory Visit: Payer: Self-pay

## 2021-04-17 DIAGNOSIS — I6529 Occlusion and stenosis of unspecified carotid artery: Secondary | ICD-10-CM

## 2021-04-23 ENCOUNTER — Encounter (HOSPITAL_COMMUNITY): Payer: Medicare Other

## 2021-04-23 ENCOUNTER — Encounter: Payer: Medicare Other | Admitting: Vascular Surgery

## 2021-05-27 ENCOUNTER — Telehealth: Payer: Self-pay

## 2021-05-27 NOTE — Telephone Encounter (Signed)
Attempted to contact patient's daughter Cynthia to schedule a Palliative Care consult appointment. No answer left a message to return call.    

## 2021-05-30 ENCOUNTER — Telehealth: Payer: Self-pay

## 2021-05-30 NOTE — Telephone Encounter (Signed)
Spoke with patient's daughter Amy Mckay and scheduled an in-person Palliative Consult for 06/10/21 @ 2:30 PM with Dr. Bufford Spikes. Documentation will be noted in [facility]'s EMR Netsmart.   COVID screening was negative. Dog in the home will put away for visit. Patient lives with daughter which is under Hospice but will be discharged soon.    Consent obtained; updated Outlook/Netsmart/Team List and Epic.   Family is aware they may be receiving a call from provider the day before or day of to confirm appointment.  Daughter is requesting a hospice AV due to increased weakness, sleeping more, loss of appetite with 2 days not eating anything and gait problems. Will send information to Hospice team to contact patient's daughter Amy Mckay.

## 2021-10-13 ENCOUNTER — Encounter (HOSPITAL_COMMUNITY): Payer: Self-pay | Admitting: Emergency Medicine

## 2021-10-13 ENCOUNTER — Inpatient Hospital Stay (HOSPITAL_COMMUNITY)
Admission: EM | Admit: 2021-10-13 | Discharge: 2021-10-18 | DRG: 347 | Disposition: A | Discharge status: Hospice | Payer: Medicare Other | Attending: Internal Medicine | Admitting: Internal Medicine

## 2021-10-13 ENCOUNTER — Other Ambulatory Visit: Payer: Self-pay

## 2021-10-13 ENCOUNTER — Emergency Department (HOSPITAL_COMMUNITY): Payer: Medicare Other

## 2021-10-13 ENCOUNTER — Observation Stay (HOSPITAL_COMMUNITY): Payer: Medicare Other

## 2021-10-13 DIAGNOSIS — M419 Scoliosis, unspecified: Secondary | ICD-10-CM | POA: Diagnosis present

## 2021-10-13 DIAGNOSIS — R531 Weakness: Secondary | ICD-10-CM | POA: Diagnosis not present

## 2021-10-13 DIAGNOSIS — I739 Peripheral vascular disease, unspecified: Secondary | ICD-10-CM | POA: Diagnosis present

## 2021-10-13 DIAGNOSIS — D500 Iron deficiency anemia secondary to blood loss (chronic): Secondary | ICD-10-CM | POA: Diagnosis present

## 2021-10-13 DIAGNOSIS — G929 Unspecified toxic encephalopathy: Secondary | ICD-10-CM | POA: Diagnosis present

## 2021-10-13 DIAGNOSIS — I951 Orthostatic hypotension: Secondary | ICD-10-CM | POA: Diagnosis present

## 2021-10-13 DIAGNOSIS — K224 Dyskinesia of esophagus: Secondary | ICD-10-CM | POA: Diagnosis present

## 2021-10-13 DIAGNOSIS — M545 Low back pain, unspecified: Secondary | ICD-10-CM | POA: Diagnosis present

## 2021-10-13 DIAGNOSIS — R4189 Other symptoms and signs involving cognitive functions and awareness: Secondary | ICD-10-CM | POA: Diagnosis present

## 2021-10-13 DIAGNOSIS — R55 Syncope and collapse: Secondary | ICD-10-CM | POA: Diagnosis present

## 2021-10-13 DIAGNOSIS — Z20822 Contact with and (suspected) exposure to covid-19: Secondary | ICD-10-CM | POA: Diagnosis present

## 2021-10-13 DIAGNOSIS — Z66 Do not resuscitate: Secondary | ICD-10-CM | POA: Diagnosis present

## 2021-10-13 DIAGNOSIS — J449 Chronic obstructive pulmonary disease, unspecified: Secondary | ICD-10-CM | POA: Diagnosis present

## 2021-10-13 DIAGNOSIS — I10 Essential (primary) hypertension: Secondary | ICD-10-CM | POA: Diagnosis present

## 2021-10-13 DIAGNOSIS — F17200 Nicotine dependence, unspecified, uncomplicated: Secondary | ICD-10-CM | POA: Insufficient documentation

## 2021-10-13 DIAGNOSIS — Z7982 Long term (current) use of aspirin: Secondary | ICD-10-CM

## 2021-10-13 DIAGNOSIS — Z7951 Long term (current) use of inhaled steroids: Secondary | ICD-10-CM

## 2021-10-13 DIAGNOSIS — Z880 Allergy status to penicillin: Secondary | ICD-10-CM

## 2021-10-13 DIAGNOSIS — R413 Other amnesia: Secondary | ICD-10-CM | POA: Diagnosis present

## 2021-10-13 DIAGNOSIS — F17210 Nicotine dependence, cigarettes, uncomplicated: Secondary | ICD-10-CM | POA: Diagnosis present

## 2021-10-13 DIAGNOSIS — I959 Hypotension, unspecified: Secondary | ICD-10-CM

## 2021-10-13 DIAGNOSIS — K644 Residual hemorrhoidal skin tags: Secondary | ICD-10-CM | POA: Diagnosis present

## 2021-10-13 DIAGNOSIS — Z515 Encounter for palliative care: Secondary | ICD-10-CM

## 2021-10-13 DIAGNOSIS — G8929 Other chronic pain: Secondary | ICD-10-CM | POA: Diagnosis present

## 2021-10-13 DIAGNOSIS — M47819 Spondylosis without myelopathy or radiculopathy, site unspecified: Secondary | ICD-10-CM | POA: Diagnosis present

## 2021-10-13 DIAGNOSIS — K219 Gastro-esophageal reflux disease without esophagitis: Secondary | ICD-10-CM | POA: Diagnosis present

## 2021-10-13 DIAGNOSIS — K640 First degree hemorrhoids: Secondary | ICD-10-CM | POA: Diagnosis present

## 2021-10-13 DIAGNOSIS — R1314 Dysphagia, pharyngoesophageal phase: Secondary | ICD-10-CM | POA: Diagnosis present

## 2021-10-13 DIAGNOSIS — K921 Melena: Secondary | ICD-10-CM | POA: Diagnosis not present

## 2021-10-13 DIAGNOSIS — K621 Rectal polyp: Secondary | ICD-10-CM | POA: Diagnosis present

## 2021-10-13 DIAGNOSIS — E039 Hypothyroidism, unspecified: Secondary | ICD-10-CM | POA: Diagnosis present

## 2021-10-13 DIAGNOSIS — K626 Ulcer of anus and rectum: Secondary | ICD-10-CM | POA: Diagnosis present

## 2021-10-13 DIAGNOSIS — Z79899 Other long term (current) drug therapy: Secondary | ICD-10-CM

## 2021-10-13 DIAGNOSIS — R1319 Other dysphagia: Secondary | ICD-10-CM

## 2021-10-13 DIAGNOSIS — Z7902 Long term (current) use of antithrombotics/antiplatelets: Secondary | ICD-10-CM

## 2021-10-13 DIAGNOSIS — K635 Polyp of colon: Secondary | ICD-10-CM | POA: Diagnosis present

## 2021-10-13 DIAGNOSIS — G894 Chronic pain syndrome: Secondary | ICD-10-CM | POA: Diagnosis present

## 2021-10-13 DIAGNOSIS — Z79890 Hormone replacement therapy (postmenopausal): Secondary | ICD-10-CM

## 2021-10-13 DIAGNOSIS — G2581 Restless legs syndrome: Secondary | ICD-10-CM | POA: Diagnosis present

## 2021-10-13 DIAGNOSIS — R918 Other nonspecific abnormal finding of lung field: Secondary | ICD-10-CM | POA: Diagnosis present

## 2021-10-13 DIAGNOSIS — Z720 Tobacco use: Secondary | ICD-10-CM

## 2021-10-13 DIAGNOSIS — G9340 Encephalopathy, unspecified: Secondary | ICD-10-CM

## 2021-10-13 DIAGNOSIS — E871 Hypo-osmolality and hyponatremia: Secondary | ICD-10-CM | POA: Diagnosis present

## 2021-10-13 DIAGNOSIS — J9611 Chronic respiratory failure with hypoxia: Secondary | ICD-10-CM | POA: Diagnosis present

## 2021-10-13 DIAGNOSIS — D649 Anemia, unspecified: Secondary | ICD-10-CM | POA: Diagnosis present

## 2021-10-13 DIAGNOSIS — R195 Other fecal abnormalities: Secondary | ICD-10-CM | POA: Diagnosis present

## 2021-10-13 DIAGNOSIS — I6501 Occlusion and stenosis of right vertebral artery: Secondary | ICD-10-CM | POA: Diagnosis present

## 2021-10-13 DIAGNOSIS — K5730 Diverticulosis of large intestine without perforation or abscess without bleeding: Secondary | ICD-10-CM | POA: Diagnosis present

## 2021-10-13 LAB — CBC WITH DIFFERENTIAL/PLATELET
Abs Immature Granulocytes: 0.03 10*3/uL (ref 0.00–0.07)
Basophils Absolute: 0 10*3/uL (ref 0.0–0.1)
Basophils Relative: 0 %
Eosinophils Absolute: 0.1 10*3/uL (ref 0.0–0.5)
Eosinophils Relative: 1 %
HCT: 30.6 % — ABNORMAL LOW (ref 36.0–46.0)
Hemoglobin: 9.3 g/dL — ABNORMAL LOW (ref 12.0–15.0)
Immature Granulocytes: 0 %
Lymphocytes Relative: 12 %
Lymphs Abs: 1.2 10*3/uL (ref 0.7–4.0)
MCH: 27.4 pg (ref 26.0–34.0)
MCHC: 30.4 g/dL (ref 30.0–36.0)
MCV: 90 fL (ref 80.0–100.0)
Monocytes Absolute: 0.9 10*3/uL (ref 0.1–1.0)
Monocytes Relative: 9 %
Neutro Abs: 7.5 10*3/uL (ref 1.7–7.7)
Neutrophils Relative %: 78 %
Platelets: 340 10*3/uL (ref 150–400)
RBC: 3.4 MIL/uL — ABNORMAL LOW (ref 3.87–5.11)
RDW: 14.5 % (ref 11.5–15.5)
WBC: 9.7 10*3/uL (ref 4.0–10.5)
nRBC: 0 % (ref 0.0–0.2)

## 2021-10-13 LAB — URINALYSIS, ROUTINE W REFLEX MICROSCOPIC
Bilirubin Urine: NEGATIVE
Glucose, UA: NEGATIVE mg/dL
Hgb urine dipstick: NEGATIVE
Ketones, ur: NEGATIVE mg/dL
Leukocytes,Ua: NEGATIVE
Nitrite: NEGATIVE
Protein, ur: NEGATIVE mg/dL
Specific Gravity, Urine: 1.013 (ref 1.005–1.030)
pH: 6 (ref 5.0–8.0)

## 2021-10-13 LAB — LIPASE, BLOOD: Lipase: 36 U/L (ref 11–51)

## 2021-10-13 LAB — COMPREHENSIVE METABOLIC PANEL
ALT: 14 U/L (ref 0–44)
AST: 20 U/L (ref 15–41)
Albumin: 4 g/dL (ref 3.5–5.0)
Alkaline Phosphatase: 72 U/L (ref 38–126)
Anion gap: 6 (ref 5–15)
BUN: 19 mg/dL (ref 8–23)
CO2: 32 mmol/L (ref 22–32)
Calcium: 8.5 mg/dL — ABNORMAL LOW (ref 8.9–10.3)
Chloride: 94 mmol/L — ABNORMAL LOW (ref 98–111)
Creatinine, Ser: 0.88 mg/dL (ref 0.44–1.00)
GFR, Estimated: >60 mL/min (ref >60)
Glucose, Bld: 122 mg/dL — ABNORMAL HIGH (ref 70–99)
Potassium: 4.3 mmol/L (ref 3.5–5.1)
Sodium: 132 mmol/L — ABNORMAL LOW (ref 135–145)
Total Bilirubin: 0.2 mg/dL — ABNORMAL LOW (ref 0.3–1.2)
Total Protein: 7.8 g/dL (ref 6.5–8.1)

## 2021-10-13 LAB — TYPE AND SCREEN
ABO/RH(D): O NEG
Antibody Screen: NEGATIVE

## 2021-10-13 LAB — POC OCCULT BLOOD, ED: Fecal Occult Bld: POSITIVE — AB

## 2021-10-13 LAB — RESP PANEL BY RT-PCR (FLU A&B, COVID) ARPGX2
Influenza A by PCR: NEGATIVE
Influenza B by PCR: NEGATIVE
SARS Coronavirus 2 by RT PCR: NEGATIVE

## 2021-10-13 MED ORDER — BUDESON-GLYCOPYRROL-FORMOTEROL 160-9-4.8 MCG/ACT IN AERO: 2.0000 | INHALATION_SPRAY | Freq: Every day | RESPIRATORY_TRACT | Status: DC

## 2021-10-13 MED ORDER — SODIUM CHLORIDE 0.9 % IV BOLUS
1000.0000 mL | Freq: Once | INTRAVENOUS | Status: AC
  Administered 2021-10-13: 1000 mL via INTRAVENOUS

## 2021-10-13 MED ORDER — IOHEXOL 350 MG/ML SOLN
80.0000 mL | Freq: Once | INTRAVENOUS | Status: AC | PRN
  Administered 2021-10-13: 75 mL via INTRAVENOUS

## 2021-10-13 MED ORDER — ACETAMINOPHEN 325 MG PO TABS
650.0000 mg | ORAL_TABLET | Freq: Four times a day (QID) | ORAL | Status: DC | PRN
  Administered 2021-10-15 – 2021-10-16 (×2): 650 mg via ORAL
  Filled 2021-10-13 (×3): qty 2

## 2021-10-13 MED ORDER — ACETAMINOPHEN 650 MG RE SUPP: 650.0000 mg | Freq: Four times a day (QID) | RECTAL | Status: DC | PRN

## 2021-10-13 MED ORDER — SODIUM CHLORIDE 0.9% FLUSH
3.0000 mL | Freq: Two times a day (BID) | INTRAVENOUS | Status: DC
  Administered 2021-10-13 – 2021-10-18 (×9): 3 mL via INTRAVENOUS

## 2021-10-13 MED ORDER — UMECLIDINIUM BROMIDE 62.5 MCG/ACT IN AEPB
1.0000 | INHALATION_SPRAY | Freq: Every day | RESPIRATORY_TRACT | Status: DC
  Administered 2021-10-14 – 2021-10-17 (×4): 1 via RESPIRATORY_TRACT
  Filled 2021-10-13: qty 7

## 2021-10-13 MED ORDER — OXYCODONE HCL 5 MG PO TABS
10.0000 mg | ORAL_TABLET | Freq: Four times a day (QID) | ORAL | Status: DC | PRN
  Administered 2021-10-13 – 2021-10-18 (×16): 10 mg via ORAL
  Filled 2021-10-13 (×16): qty 2

## 2021-10-13 MED ORDER — IPRATROPIUM-ALBUTEROL 0.5-2.5 (3) MG/3ML IN SOLN: 3.0000 mL | Freq: Four times a day (QID) | RESPIRATORY_TRACT | Status: DC | PRN

## 2021-10-13 MED ORDER — LEVOTHYROXINE SODIUM 75 MCG PO TABS
75.0000 ug | ORAL_TABLET | Freq: Every day | ORAL | Status: DC
  Administered 2021-10-14 – 2021-10-17 (×4): 75 ug via ORAL
  Filled 2021-10-13 (×5): qty 1

## 2021-10-13 MED ORDER — PANTOPRAZOLE SODIUM 40 MG IV SOLR
40.0000 mg | Freq: Once | INTRAVENOUS | Status: AC
  Administered 2021-10-13: 40 mg via INTRAVENOUS
  Filled 2021-10-13: qty 10

## 2021-10-13 MED ORDER — ONDANSETRON HCL 4 MG/2ML IJ SOLN
4.0000 mg | Freq: Four times a day (QID) | INTRAMUSCULAR | Status: DC | PRN
Start: 2021-10-13 — End: 2021-10-19

## 2021-10-13 MED ORDER — MEMANTINE HCL 10 MG PO TABS
10.0000 mg | ORAL_TABLET | Freq: Two times a day (BID) | ORAL | Status: DC
  Administered 2021-10-13 – 2021-10-18 (×10): 10 mg via ORAL
  Filled 2021-10-13 (×10): qty 1

## 2021-10-13 MED ORDER — SODIUM CHLORIDE 0.9 % IV SOLN: INTRAVENOUS | Status: AC

## 2021-10-13 MED ORDER — NICOTINE 21 MG/24HR TD PT24
21.0000 mg | MEDICATED_PATCH | Freq: Every day | TRANSDERMAL | Status: DC
  Administered 2021-10-13 – 2021-10-18 (×6): 21 mg via TRANSDERMAL
  Filled 2021-10-13 (×6): qty 1

## 2021-10-13 MED ORDER — PRAMIPEXOLE DIHYDROCHLORIDE 0.25 MG PO TABS
0.2500 mg | ORAL_TABLET | Freq: Every day | ORAL | Status: DC
  Administered 2021-10-13 – 2021-10-18 (×6): 0.25 mg via ORAL
  Filled 2021-10-13 (×6): qty 1

## 2021-10-13 MED ORDER — PANTOPRAZOLE SODIUM 40 MG IV SOLR
40.0000 mg | Freq: Two times a day (BID) | INTRAVENOUS | Status: DC
  Administered 2021-10-14 – 2021-10-18 (×10): 40 mg via INTRAVENOUS
  Filled 2021-10-13 (×10): qty 10

## 2021-10-13 MED ORDER — ALBUTEROL SULFATE (2.5 MG/3ML) 0.083% IN NEBU
3.0000 mL | INHALATION_SOLUTION | Freq: Four times a day (QID) | RESPIRATORY_TRACT | Status: DC | PRN
  Administered 2021-10-17: 3 mL via RESPIRATORY_TRACT

## 2021-10-13 MED ORDER — IOHEXOL 300 MG/ML  SOLN
75.0000 mL | Freq: Once | INTRAMUSCULAR | Status: AC | PRN
  Administered 2021-10-13: 75 mL via INTRAVENOUS

## 2021-10-13 MED ORDER — ONDANSETRON HCL 4 MG PO TABS: 4.0000 mg | ORAL_TABLET | Freq: Four times a day (QID) | ORAL | Status: DC | PRN

## 2021-10-13 MED ORDER — GABAPENTIN 400 MG PO CAPS
800.0000 mg | ORAL_CAPSULE | Freq: Three times a day (TID) | ORAL | Status: DC
  Administered 2021-10-13 – 2021-10-18 (×15): 800 mg via ORAL
  Filled 2021-10-13 (×15): qty 2

## 2021-10-13 MED ORDER — ONDANSETRON HCL 4 MG/2ML IJ SOLN
4.0000 mg | Freq: Once | INTRAMUSCULAR | Status: AC
  Administered 2021-10-13: 4 mg via INTRAVENOUS
  Filled 2021-10-13: qty 2

## 2021-10-13 MED ORDER — ATORVASTATIN CALCIUM 10 MG PO TABS
10.0000 mg | ORAL_TABLET | Freq: Every day | ORAL | Status: DC
Start: 2021-10-14 — End: 2021-10-19
  Administered 2021-10-14 – 2021-10-18 (×5): 10 mg via ORAL
  Filled 2021-10-13 (×5): qty 1

## 2021-10-13 MED ORDER — MOMETASONE FURO-FORMOTEROL FUM 100-5 MCG/ACT IN AERO
2.0000 | INHALATION_SPRAY | Freq: Two times a day (BID) | RESPIRATORY_TRACT | Status: DC
  Administered 2021-10-14 – 2021-10-18 (×8): 2 via RESPIRATORY_TRACT
  Filled 2021-10-13: qty 8.8

## 2021-10-13 NOTE — Assessment & Plan Note (Addendum)
She has advanced DDD and facet disease and lumbar spines with leftward scoliosis.  She has BLE weakness.  No new focal neurodeficit, bowel or bladder habit change.  She is on high-dose of oxycodone and gabapentin. She is also in Flexeril.  She is at risk for polypharmacy.  We discussed risk with patient and patient's daughter.  She is willing to stop Flexeril for now. ?-Continue home oxycodone and gabapentin ?-Discontinued Flexeril. ?-I encouraged her to discuss with her prescribers ?

## 2021-10-13 NOTE — H&P (Signed)
?History and Physical  ? ? ?Amy BeltonDelores Mckay JXB:[id number] DOB: Aug 02, 1934 DOA: 10/13/2021 ? ?PCP: Annita BrodAsenso, Philip, MD  ?Patient coming from: Home ? ?I have personally briefly reviewed patient's [facility] ? ?Chief Complaint: Dizziness ? ?HPI: ?Amy BeltonDelores Mckay is a 87 y.o. female with medical history significant for COPD, chronic respiratory failure with hypoxia on 4 L supplemental O2 via [place], PAD s/p SMA stenting, chronic hyponatremia, HTN, hypothyroidism, GERD, RLS, chronic pain syndrome, memory impairment who presented to the ED for evaluation of dizziness.  History is limited from patient and is otherwise supplemented by EDP, chart review, and patient's granddaughter at bedside. ? ?Patient states that she was short of breath 2 days ago which she attributed to forgetting to use her inhalers.  She says breathing did improve after using her inhalers.  Morning of 5/1 she had an episode of dizziness when she bent over to pick up her shoes.  She felt lightheaded as if she was in a pass out but did not lose consciousness.  She had an episode of nausea with vomiting.  She says she was able to make it to the chair to sit down which improved her symptoms. ? ?She reports seeing dark-colored stools recently but not sure if they appeared black.  She has not seen any bright red blood in her stool.  She denies any abdominal pain, chest pain. ? ?ED Course  Labs/Imaging on admission: I have personally reviewed following labs and imaging studies. ? ?Initial vitals showed BP 96/68, pulse 81, RR 22, temp 98.8 ?F rectally, SPO2 100% on 4 L O2 via . ? ?Labs show WBC 9.7, hemoglobin 9.3, platelets 340,000, sodium 132, potassium 4.3, bicarb 32, BUN 19, creatinine 0.88, serum glucose 122, LFTs within normal limits, lipase 36. ? ?SARS-CoV-2 and influenza PCR negative.  FOBT positive.  Urinalysis in process. ? ?Portable chest x-ray negative for focal consolidation, edema, effusion. ? ?CT head without contrast  negative for acute intracranial abnormality. ? ?CTA chest PE study negative for evidence of PE.  Multiple pulmonary nodules noted.  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 ? ?CT abdomen/pelvis with contrast negative for acute process in the abdomen or pelvis.  Colonic diverticulosis without evidence for acute diverticulitis noted.  Severe atherosclerotic disease and unchanged hypodense benign-appearing lesion in the uncinate process of the pancreas seen. ? ?Patient was given 1 L normal saline, IV Protonix 40 mg.  EDP notified on-call [facility].  The hospitalist service was consulted to admit for further evaluation and management. ? ?Review of Systems: All systems reviewed and are negative except as documented in history of present illness above. ? ? ?Past Medical History:  ?Diagnosis Date  ? Acid reflux   ? COPD (chronic obstructive pulmonary disease) (HCC)   ? Hypertension   ? ? ?History reviewed. No pertinent surgical history. ? ?Social History: ? reports that she has been smoking cigarettes. She has been smoking an average of 1.5 packs per day. She has never used smokeless tobacco. She reports that she does not drink alcohol and does not use drugs. ? ?Allergies  ?Allergen Reactions  ? Penicillins Anaphylaxis and Swelling  ? ? ?History reviewed. No pertinent family history. ? ? ?Prior to Admission medications   ?Medication Sig Start Date End Date Taking? Authorizing Provider  ?albuterol (PROVENTIL HFA;VENTOLIN HFA) 108 (90 Base) MCG/ACT inhaler Inhale 2 puffs into the lungs daily as needed for shortness of breath. 06/27/15   [provider]  ?  amLODipine (NORVASC) 5 MG tablet Take 5 mg by mouth daily. 11/18/20   [provider]  ?Ascorbic Acid (VITAMIN C PO) Take 1 tablet by mouth daily.    [provider]  ?aspirin EC 81 MG tablet Take 81 mg by mouth daily.    [provider]  ?clopidogrel (PLAVIX) 75 MG tablet Take 75 mg by mouth daily. 06/04/16    [provider]  ?cyclobenzaprine (FLEXERIL) 5 MG tablet Take 5 mg by mouth 3 (three) times daily as needed for muscle spasms. 04/25/16   [provider]  ?esomeprazole (NEXIUM) 40 MG capsule Take 40 mg by mouth 2 (two) times daily. 11/01/20   [provider]  ?gabapentin (NEURONTIN) 800 MG tablet Take 800 mg by mouth 3 (three) times daily. 04/25/16   [provider]  ?ibuprofen (ADVIL) 200 MG tablet Take 200 mg by mouth every 6 (six) hours as needed for fever, headache or mild pain.    [provider]  ?levothyroxine (SYNTHROID, LEVOTHROID) 75 MCG tablet Take 75 mcg by mouth daily before breakfast. 02/05/16   [provider]  ?losartan (COZAAR) 100 MG tablet Take 100 mg by mouth daily. 12/02/20   [provider]  ?MAGNESIUM PO Take 1 tablet by mouth daily.    [provider]  ?memantine (NAMENDA) 5 MG tablet Take 5 mg by mouth 2 (two) times daily. 11/01/20   [provider]  ?Multiple Vitamin (MULTIVITAMIN WITH MINERALS) TABS tablet Take 1 tablet by mouth daily.    [provider]  ?oxyCODONE (OXY IR/ROXICODONE) 5 MG immediate release tablet Take 5 mg by mouth every 4 (four) hours as needed for pain. 01/02/21   [provider]  ?pramipexole (MIRAPEX) 0.25 MG tablet Take 0.25 mg by mouth at bedtime. 01/02/21   [provider]  ? ? ?Physical Exam: ?Vitals:  ? 10/13/21 1958 10/13/21 2100 10/13/21 2117 10/13/21 2136  ?BP: 104/76 113/83  128/75  ?Pulse: 93 85  86  ?Resp: 20 10  20   ?Temp: 98.3 ?F (36.8 ?C)  98 ?F (36.7 ?C) 98.6 ?F (37 ?C)  ?TempSrc: Oral  Oral Oral  ?SpO2: 100% 100%  100%  ?Weight:    53 kg  ?Height:    5\' 1"  (1.549 m)  ? ?Constitutional: Elderly woman resting supine in bed, NAD, calm, comfortable ?Eyes: lids and conjunctivae normal ?ENMT: Mucous membranes are dry. Posterior pharynx clear of any exudate or lesions.Normal dentition.  ?Neck: normal, supple, no masses. ?Respiratory: Expiratory wheezing  throughout. Normal respiratory effort. No accessory muscle use.  ?Cardiovascular: Regular rate and rhythm, no murmurs / rubs / gallops. No extremity edema. 2+ pedal pulses. ?Abdomen: no tenderness, no masses palpated. No hepatosplenomegaly. Bowel sounds positive.  ?Musculoskeletal: no clubbing / cyanosis. No joint deformity upper and lower extremities. Good ROM, no contractures. Normal muscle tone.  ?Skin: no rashes, lesions, ulcers. No induration ?Neurologic: Sensation intact. Strength 5/5 in all 4.  ?Psychiatric: Alert and oriented x 3.  Tangential speech. ? ?EKG: Personally reviewed. Normal sinus rhythm, biatrial enlargement, no acute ischemic changes.  Rate is faster when compared to prior. ? ?Assessment/Plan ?Principal Problem: ?  Near syncope ?Active Problems: ?  Normocytic anemia ?  COPD (chronic obstructive pulmonary disease) (HCC) ?  Chronic respiratory failure with hypoxia (HCC) ?  Peripheral arterial disease (HCC) ?  Benign essential HTN ?  Hypothyroidism ?  Memory impairment ?  Restless leg ?  Chronic hyponatremia ?  Chronic pain ?  Tobacco use ?  Pulmonary  nodules/lesions, multiple ?  ?Amy Mckay is a 87 y.o. female with medical history significant for COPD, chronic respiratory failure with hypoxia on 4 L supplemental O2 via [place], PAD s/p SMA stenting, chronic hyponatremia, HTN, hypothyroidism, GERD, RLS, chronic pain syndrome, memory impairment who is admitted for evaluation of near syncope and anemia. ? ?Assessment and Plan: ?* Near syncope ?Suspect orthostatic or vasovagal near syncope in setting of hypotension and new anemia. ?-Continue IV fluid hydration overnight ?-Check orthostatic vitals ?-Hold antihypertensives ?-PT/OT eval ? ?Normocytic anemia ?Hemoglobin 9.3 on admission compared to prior baseline >12.  FOBT is positive.  Question of dark stools.  Colonic diverticulosis noted on CT imaging. ?-[facility] notified by EDP ?-Continue IV Protonix 40 mg twice daily for now ?-Follow hemoglobin and  transfuse if <7.0 ? ?COPD (chronic obstructive pulmonary disease) (HCC) ?Some wheezing on admission.  Patient continuing to smoke at least a pack per day.  SPO2 stable on home 4 L O2 via [place]. ?-Continue maintenance

## 2021-10-13 NOTE — ED Provider Notes (Signed)
[facility]-EMERGENCY DEPT Provider Note   CSN: [id number] Arrival date & time: 10/13/21  1600     History  Chief Complaint  Patient presents with   Weakness   Altered Mental Status    Philip Alto is a 87 y.o. female.  Patient here with generalized weakness, had an episode of dizziness this morning when she stood up.  Has a history of COPD on 4 L of oxygen chronically, hypertension, peripheral arterial disease.  He is on Plavix but no other blood thinners.  Maybe some early memory issues per caregiver in the room.  She is currently on hospice however actively would want IV antibiotics and treatments if necessary.  She has a home health aide who stays with her daily.  Patient got dizzy when she bent over to pick up her shoes.  She had an episode of vomiting.  She denies any weakness, numbness.  She has chronic back pain.  She denies any diarrhea or black or bloody stools.  May be some abdominal discomfort as well.  The history is provided by the patient and a caregiver.  Weakness Severity:  Moderate Onset quality:  Gradual Duration:  1 day Timing:  Constant Progression:  Unchanged Chronicity:  New Context: not recent infection   Relieved by:  Nothing Worsened by:  Nothing Associated symptoms: abdominal pain, dizziness, nausea and vomiting   Associated symptoms: no anorexia, no aphasia, no arthralgias, no ataxia, no chest pain, no cough, no diarrhea, no difficulty walking, no drooling, no dysphagia, no dysuria, no numbness in extremities, no falls, no fever, no foul-smelling urine, no frequency, no headaches, no hematochezia, no lethargy, no loss of consciousness, no melena, no myalgias, no near-syncope, no seizures, no sensory-motor deficit, no shortness of breath, no syncope and no urgency   Altered Mental Status Presenting symptoms: no lethargy   Associated symptoms: abdominal pain, nausea, vomiting and weakness   Associated symptoms: no fever, no headaches  and no seizures       Home Medications Prior to Admission medications   Medication Sig Start Date End Date Taking? Authorizing Provider  albuterol (PROVENTIL HFA;VENTOLIN HFA) 108 (90 Base) MCG/ACT inhaler Inhale 2 puffs into the lungs daily as needed for shortness of breath. 06/27/15   [provider]  amLODipine (NORVASC) 5 MG tablet Take 5 mg by mouth daily. 11/18/20   [provider]  Ascorbic Acid (VITAMIN C PO) Take 1 tablet by mouth daily.    [provider]  aspirin EC 81 MG tablet Take 81 mg by mouth daily.    [provider]  clopidogrel (PLAVIX) 75 MG tablet Take 75 mg by mouth daily. 06/04/16   [provider]  cyclobenzaprine (FLEXERIL) 5 MG tablet Take 5 mg by mouth 3 (three) times daily as needed for muscle spasms. 04/25/16   [provider]  esomeprazole (NEXIUM) 40 MG capsule Take 40 mg by mouth 2 (two) times daily. 11/01/20   [provider]  gabapentin (NEURONTIN) 800 MG tablet Take 800 mg by mouth 3 (three) times daily. 04/25/16   [provider]  ibuprofen (ADVIL) 200 MG tablet Take 200 mg by mouth every 6 (six) hours as needed for fever, headache or mild pain.    [provider]  levothyroxine (SYNTHROID, LEVOTHROID) 75 MCG tablet Take 75 mcg by mouth daily before breakfast. 02/05/16   [provider]  losartan (COZAAR) 100 MG tablet Take 100 mg by mouth daily. 12/02/20   [provider]  MAGNESIUM PO  Take 1 tablet by mouth daily.    [provider]  memantine (NAMENDA) 5 MG tablet Take 5 mg by mouth 2 (two) times daily. 11/01/20   [provider]  Multiple Vitamin (MULTIVITAMIN WITH MINERALS) TABS tablet Take 1 tablet by mouth daily.    [provider]  oxyCODONE (OXY IR/ROXICODONE) 5 MG immediate release tablet Take 5 mg by mouth every 4 (four) hours as needed for pain. 01/02/21   [provider]  pramipexole (MIRAPEX) 0.25 MG tablet Take 0.25 mg  by mouth at bedtime. 01/02/21   [provider]      Allergies    Penicillins    Review of Systems   Review of Systems  Constitutional:  Negative for fever.  HENT:  Negative for drooling.   Respiratory:  Negative for cough and shortness of breath.   Cardiovascular:  Negative for chest pain, syncope and near-syncope.  Gastrointestinal:  Positive for abdominal pain, nausea and vomiting. Negative for anorexia, diarrhea, dysphagia, hematochezia and melena.  Genitourinary:  Negative for dysuria, frequency and urgency.  Musculoskeletal:  Negative for arthralgias, falls and myalgias.  Neurological:  Positive for dizziness and weakness. Negative for seizures, loss of consciousness and headaches.   Physical Exam Updated Vital Signs BP 103/75   Pulse (!) 122   Temp 98.8 F (37.1 C) (Rectal)   Resp 12   SpO2 96%  Physical Exam Vitals and nursing note reviewed.  Constitutional:      General: She is not in acute distress.    Appearance: She is well-developed. She is not ill-appearing.  HENT:     Head: Normocephalic and atraumatic.     Nose: Nose normal.     Mouth/Throat:     Mouth: Mucous membranes are dry.     Comments: Mucous membranes are very dry Eyes:     Extraocular Movements: Extraocular movements intact.     Conjunctiva/sclera: Conjunctivae normal.     Pupils: Pupils are equal, round, and reactive to light.  Cardiovascular:     Rate and Rhythm: Normal rate and regular rhythm.     Pulses: Normal pulses.     Heart sounds: Normal heart sounds. No murmur heard. Pulmonary:     Effort: Pulmonary effort is normal. No respiratory distress.     Breath sounds: Normal breath sounds.  Abdominal:     Palpations: Abdomen is soft.     Tenderness: There is no abdominal tenderness.  Genitourinary:    Rectum: Guaiac result positive.  Musculoskeletal:        General: No swelling.     Cervical back: Normal range of motion and neck supple.  Skin:    General: Skin is warm and  dry.     Capillary Refill: Capillary refill takes less than 2 seconds.  Neurological:     General: No focal deficit present.     Mental Status: She is alert.     Cranial Nerves: No cranial nerve deficit.     Sensory: No sensory deficit.     Motor: No weakness.     Coordination: Coordination normal.     Comments: Appears to have normal speech, normal visual field, 5+ out of 5 strength, normal sensation, normal speech, no drift, normal finger-to-nose finger  Psychiatric:        Mood and Affect: Mood normal.    ED Results / Procedures / Treatments   Labs (all labs ordered are listed, but only abnormal results are displayed) Labs Reviewed  CBC WITH DIFFERENTIAL/PLATELET - Abnormal;  Notable for the following components:      Result Value   RBC 3.40 (*)    Hemoglobin 9.3 (*)    HCT 30.6 (*)    All other components within normal limits  COMPREHENSIVE METABOLIC PANEL - Abnormal; Notable for the following components:   Sodium 132 (*)    Chloride 94 (*)    Glucose, Bld 122 (*)    Calcium 8.5 (*)    Total Bilirubin 0.2 (*)    All other components within normal limits  POC OCCULT BLOOD, ED - Abnormal; Notable for the following components:   Fecal Occult Bld POSITIVE (*)    All other components within normal limits  URINE CULTURE  RESP PANEL BY RT-PCR (FLU A&B, COVID) ARPGX2  LIPASE, BLOOD  URINALYSIS, ROUTINE W REFLEX MICROSCOPIC  TYPE AND SCREEN    EKG None  Radiology CT Head Wo Contrast  Result Date: 10/13/2021 CLINICAL DATA:  Dizziness. EXAM: CT HEAD WITHOUT CONTRAST TECHNIQUE: Contiguous axial images were obtained from the base of the skull through the vertex without intravenous contrast. RADIATION DOSE REDUCTION: This exam was performed according to the departmental dose-optimization program which includes automated exposure control, adjustment of the mA and/or kV according to patient size and/or use of iterative reconstruction technique. COMPARISON:  January 29, 2021. FINDINGS:  Brain: Mild diffuse cortical atrophy is noted. No mass effect or midline shift is noted. Ventricular size is within normal limits. There is no evidence of mass lesion, hemorrhage or acute infarction. Vascular: No hyperdense vessel or unexpected calcification. Skull: Normal. Negative for fracture or focal lesion. Sinuses/Orbits: No acute finding. Other: None. IMPRESSION: No acute intracranial abnormality seen. Electronically Signed   By: Lupita Raider M.D.   On: 10/13/2021 17:48   CT Angio Chest PE W and/or Wo Contrast  Result Date: 10/13/2021 CLINICAL DATA:  Weakness, dizziness, hypotension, emesis EXAM: CT ANGIOGRAPHY CHEST WITH CONTRAST TECHNIQUE: Multidetector CT imaging of the chest was performed using the standard protocol during bolus administration of intravenous contrast. Multiplanar CT image reconstructions and MIPs were obtained to evaluate the vascular anatomy. RADIATION DOSE REDUCTION: This exam was performed according to the departmental dose-optimization program which includes automated exposure control, adjustment of the mA and/or kV according to patient size and/or use of iterative reconstruction technique. CONTRAST:  75mL OMNIPAQUE IOHEXOL 350 MG/ML SOLN COMPARISON:  01/29/2021 FINDINGS: Cardiovascular: This is a technically adequate evaluation of the pulmonary vasculature. No filling defects or pulmonary emboli. The heart is unremarkable without pericardial effusion. Extensive atherosclerosis of the coronary vasculature. No evidence of thoracic aortic aneurysm or dissection. Stable extensive atherosclerosis of the aortic arch and descending thoracic aorta. The right vertebral artery does not opacify beyond its origin, unchanged since prior exam. Mediastinum/Nodes: No enlarged mediastinal, hilar, or axillary lymph nodes. Thyroid gland, trachea, and esophagus demonstrate no significant findings. Lungs/Pleura: Subcentimeter nodules are seen within the right lung. 6 mm nodule seen within the right  lower lobe image 110/6, 5 mm nodule seen within the right lower lobe image 97/6, 7 x 8 mm nodule within the right middle lobe image 87/6. Small cavitating 3 mm nodule seen within the right upper lobe image 57/6. These are new since 01/29/2021, and could be related to inflammatory or infectious etiology. Close follow-up will be needed to ensure resolution and exclude underlying neoplasm. No effusion or pneumothorax. The central airways are patent. Upper Abdomen: No acute abnormality. Musculoskeletal: No acute or destructive bony lesions. Reconstructed images demonstrate no additional findings. Review of the MIP images  confirms the above findings. IMPRESSION: 1. No evidence of pulmonary embolus. 2. Multiple pulmonary nodules, new since 01/29/2021. Most severe: 8 mm right solid pulmonary nodule. Recommend a non-contrast Chest CT at 3-6 months. If patient is high risk for malignancy, recommend an additional non-contrast Chest CT at 18-24 months; if patient is low risk for malignancy a non-contrast Chest CT at 18-24 months is optional. These guidelines do not apply to immunocompromised patients and patients with cancer. Follow up in patients with significant comorbidities as clinically warranted. For lung cancer screening, adhere to Lung-RADS guidelines. Reference: Radiology. 2017; 284(1):228-43. 3. 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 4. Aortic Atherosclerosis (ICD10-I70.0). Coronary artery atherosclerosis. Electronically Signed   By: Sharlet Salina M.D.   On: 10/13/2021 17:55   DG Chest Portable 1 View  Result Date: 10/13/2021 CLINICAL DATA:  Weakness, dizziness, emesis EXAM: PORTABLE CHEST 1 VIEW COMPARISON:  01/29/2021 FINDINGS: Single frontal view of the chest demonstrates an unremarkable cardiac silhouette. No airspace disease, effusion, or pneumothorax. No acute bony abnormalities. IMPRESSION: 1. No acute intrathoracic process. Electronically Signed   By: Sharlet Salina M.D.   On:  10/13/2021 17:12    Procedures Procedures    Medications Ordered in ED Medications  pantoprazole (PROTONIX) injection 40 mg (has no administration in time range)  sodium chloride 0.9 % bolus 1,000 mL (1,000 mLs Intravenous New Bag/Given 10/13/21 1650)  ondansetron (ZOFRAN) injection 4 mg (4 mg Intravenous Given 10/13/21 1748)  iohexol (OMNIPAQUE) 350 MG/ML injection 80 mL (75 mLs Intravenous Contrast Given 10/13/21 1734)    ED Course/ Medical Decision Making/ A&P                           Medical Decision Making Amount and/or Complexity of Data Reviewed Labs: ordered. Radiology: ordered.  Risk Prescription drug management.   Shenicka Shelton is here with generalized weakness.  Patient arrives with blood pressure initially 80 systolic but on repeat 96/68.  Rectal temperature is normal.  No tachycardia.  She is on her home 4 L of oxygen.  Stool exam I am unable to get a great sample but Hemoccult was positive.  She looks very dehydrated with dry mucous membranes on exam.  Caregiver at the bedside.  Patient with some memory issues, history of COPD on 4 L of oxygen, peripheral arterial disease on Plavix.  Had some episodes of weakness and dizzy this morning reaching down to try to pick up shoes.  Has felt generally weak all day today.  Had an episode of nausea and vomiting.  May be some abdominal cramping.  Denies any unilateral weakness or numbness or speech changes or vision changes.  Her neurologic exam overall appears to be unremarkable.  Clinically she looks dehydrated.  However given low blood pressure differential is wide including infectious process versus dehydration.  Have low suspicion for stroke or sepsis at this time given history and physical thus far.  Will check CBC, CMP, lipase, urinalysis, chest x-ray, head CT.  Will give IV fluids, IV Zofran and reevaluate.  Orthostatics were done lying and sitting and blood pressure stayed in the low 90s.  Too difficult to get patient to stand.   Hemoglobin per my review and interpretation of her labs is 9.3.  This is down from hemoglobin from several years ago.  There are no prior.  Hemoccult positive.  Possible that this could be GI bleed.  We will give her dose IV Protonix and type  and screen has been ordered.  She is feeling better following IV fluids.  Blood pressure stabilized in the low 100 systolic.  She does not have white count or other electrolyte abnormalities.  Creatinine within normal limits.  CT scan of the head shows no acute findings.  CT scan of the chest showed no acute process.  CT scan of the abdomen and pelvis is pending.  Will evaluate for colitis.  Overall do not think there is an infectious process going on.  Suspect that this could be from malnutrition, dehydration versus may be a GI bleed.  Dr. Dulce Sellar with [facility].  They will follow.  Hemodynamics are improving.  Hospitalist to follow-up CT scan abdomen pelvis and urinalysis.  This chart was dictated using voice recognition software.  Despite best efforts to proofread,  errors can occur which can change the documentation meaning.         Final Clinical Impression(s) / ED Diagnoses Final diagnoses:  Weakness  Hypotension, unspecified hypotension type    Rx / DC Orders ED Discharge Orders     None         Virgina Norfolk, DO 10/13/21 1843

## 2021-10-13 NOTE — Assessment & Plan Note (Addendum)
Multifactorial including orthostatic hypotension, possible acute blood loss anemia and possibly from chronically occluded right vertebral artery.  Orthostatic vitals positive.  She is also at risk for polypharmacy on oxycodone, gabapentin and Flexeril.  Reportedly not taking antihypertensive meds. ?-Continue IV fluid hydration ?-Recheck orthostatic vitals. ?-Check carotid US ?-Monitor H&H. ?-PT/OT eval ?

## 2021-10-13 NOTE — Hospital Course (Addendum)
[name] with PMH of COPD/chronic RF on 3 to 4 L, PAD/SMA stent, chronic hyponatremia, RLS, HTN, hypothyroidism, GERD, chronic pain syndrome and cognitive impairment presenting with dizziness, shortness of breath and possible melena.  She was hypotensive to 84/63 on arrival.  Hgb 9.3 (12.8 in 01/2021).  Hemoccult positive.  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  Patient was admitted for near syncope, possible blood loss anemia with positive Hemoccult.  GI consulted-chronic dysphagia likely due to dysmotility barium swallow-limited views no stricture esophageal dysmotility noted being managed conservatively per GI.  On GI follow-up and after discussion with patient's daughter planning to pursue EGD colonoscopy 5/5.  Hemoglobin remains stable. ?

## 2021-10-13 NOTE — Assessment & Plan Note (Signed)
Continue Synthroid °

## 2021-10-13 NOTE — ED Notes (Addendum)
Patient reports being dizzy when she bent over to pick up her shoes.She reports also having an episode of emesis. She also reports "I feel like I'm in 2 places at the same time." ?

## 2021-10-13 NOTE — Assessment & Plan Note (Addendum)
S/p remote SMA stenting.   ?-Holding aspirin and Plavix in the setting of positive Hemoccult. ?-Continue statin ?

## 2021-10-13 NOTE — Assessment & Plan Note (Addendum)
Reportedly not taking amlodipine and losartan.  Normotensive now ?

## 2021-10-13 NOTE — Assessment & Plan Note (Deleted)
Awake and alert.  Oriented x4  ?-continue Namenda. ?

## 2021-10-13 NOTE — ED Triage Notes (Signed)
?  Patient presents from home with weakness and altered mental status since 2 pm today. She was last seen normal around 11am today. She has been more forgetful and feels like he is "in a dream". ? ?Hx: 4 L O2 use ? ?EMS vitals: ?107/64 BP ?81 HR ?153 CBG ?98% SPO2 on 6 L ? ? ?

## 2021-10-13 NOTE — Assessment & Plan Note (Addendum)
Recent Labs  ?  01/29/21 ?0637 01/30/21 ?1638 01/31/21 ?0410 02/01/21 ?0420 10/13/21 ?1641 10/14/21 ?0503 10/14/21 ?0825  ?HGB 12.1 11.7* 12.6 12.8 9.3* 8.0* 8.6*  ?Hemoccult positive. She seems to have severe iron deficiency suggesting some degree of blood loss anemia.  She is on Plavix and aspirin due to PAD.  Hgb stable after initial drop.  There could be some element of hemodilution. ?-GI following-no plan of EGD as of now ?-Monitor H&H.  Transfuse for Hgb <7.0 ?-IV ferric gluconate 250 mg daily x2 ?-Continue holding Plavix and aspirin. ?-Continue IV Protonix 40 mg twice daily. ?-Continue clear liquid diet ?

## 2021-10-13 NOTE — Assessment & Plan Note (Signed)
Uses 4 L O2 via [place] chronically, SPO2 stable on home O2. ?

## 2021-10-13 NOTE — Assessment & Plan Note (Addendum)
Reportedly had wheezing on presentation.  Shortness of breath improved.  She reports productive cough with clear and yellowish phlegm.  Seems to be stable on home 4 L from saturation standpoint. ?-Continue maintenance therapy with Incruse/Dulera ?-DuoNebs and albuterol as needed ?-Continue supplemental O2 to maintain SPO2 >88% ?-Smoking cessation advised ?

## 2021-10-13 NOTE — Assessment & Plan Note (Addendum)
Resolved

## 2021-10-14 ENCOUNTER — Observation Stay (HOSPITAL_COMMUNITY): Payer: Medicare Other

## 2021-10-14 DIAGNOSIS — R918 Other nonspecific abnormal finding of lung field: Secondary | ICD-10-CM

## 2021-10-14 DIAGNOSIS — K31819 Angiodysplasia of stomach and duodenum without bleeding: Secondary | ICD-10-CM | POA: Diagnosis not present

## 2021-10-14 DIAGNOSIS — R55 Syncope and collapse: Secondary | ICD-10-CM | POA: Diagnosis not present

## 2021-10-14 DIAGNOSIS — R1314 Dysphagia, pharyngoesophageal phase: Secondary | ICD-10-CM | POA: Diagnosis present

## 2021-10-14 DIAGNOSIS — F17200 Nicotine dependence, unspecified, uncomplicated: Secondary | ICD-10-CM

## 2021-10-14 DIAGNOSIS — E039 Hypothyroidism, unspecified: Secondary | ICD-10-CM

## 2021-10-14 DIAGNOSIS — G2581 Restless legs syndrome: Secondary | ICD-10-CM

## 2021-10-14 DIAGNOSIS — M5441 Lumbago with sciatica, right side: Secondary | ICD-10-CM

## 2021-10-14 DIAGNOSIS — D500 Iron deficiency anemia secondary to blood loss (chronic): Secondary | ICD-10-CM | POA: Diagnosis present

## 2021-10-14 DIAGNOSIS — J9611 Chronic respiratory failure with hypoxia: Secondary | ICD-10-CM | POA: Diagnosis present

## 2021-10-14 DIAGNOSIS — M545 Low back pain, unspecified: Secondary | ICD-10-CM | POA: Diagnosis present

## 2021-10-14 DIAGNOSIS — K921 Melena: Secondary | ICD-10-CM | POA: Diagnosis present

## 2021-10-14 DIAGNOSIS — R1319 Other dysphagia: Secondary | ICD-10-CM | POA: Diagnosis not present

## 2021-10-14 DIAGNOSIS — G9340 Encephalopathy, unspecified: Secondary | ICD-10-CM | POA: Diagnosis not present

## 2021-10-14 DIAGNOSIS — J449 Chronic obstructive pulmonary disease, unspecified: Secondary | ICD-10-CM | POA: Diagnosis present

## 2021-10-14 DIAGNOSIS — M5442 Lumbago with sciatica, left side: Secondary | ICD-10-CM

## 2021-10-14 DIAGNOSIS — I739 Peripheral vascular disease, unspecified: Secondary | ICD-10-CM

## 2021-10-14 DIAGNOSIS — G929 Unspecified toxic encephalopathy: Secondary | ICD-10-CM | POA: Diagnosis present

## 2021-10-14 DIAGNOSIS — K626 Ulcer of anus and rectum: Secondary | ICD-10-CM | POA: Diagnosis present

## 2021-10-14 DIAGNOSIS — I951 Orthostatic hypotension: Secondary | ICD-10-CM | POA: Diagnosis present

## 2021-10-14 DIAGNOSIS — R531 Weakness: Secondary | ICD-10-CM | POA: Diagnosis present

## 2021-10-14 DIAGNOSIS — D509 Iron deficiency anemia, unspecified: Secondary | ICD-10-CM | POA: Diagnosis not present

## 2021-10-14 DIAGNOSIS — G894 Chronic pain syndrome: Secondary | ICD-10-CM | POA: Diagnosis present

## 2021-10-14 DIAGNOSIS — R4189 Other symptoms and signs involving cognitive functions and awareness: Secondary | ICD-10-CM | POA: Diagnosis present

## 2021-10-14 DIAGNOSIS — E871 Hypo-osmolality and hyponatremia: Secondary | ICD-10-CM

## 2021-10-14 DIAGNOSIS — R413 Other amnesia: Secondary | ICD-10-CM

## 2021-10-14 DIAGNOSIS — Z20822 Contact with and (suspected) exposure to covid-19: Secondary | ICD-10-CM | POA: Diagnosis present

## 2021-10-14 DIAGNOSIS — F17210 Nicotine dependence, cigarettes, uncomplicated: Secondary | ICD-10-CM | POA: Diagnosis present

## 2021-10-14 DIAGNOSIS — K2970 Gastritis, unspecified, without bleeding: Secondary | ICD-10-CM | POA: Diagnosis not present

## 2021-10-14 DIAGNOSIS — I10 Essential (primary) hypertension: Secondary | ICD-10-CM | POA: Diagnosis present

## 2021-10-14 DIAGNOSIS — M47819 Spondylosis without myelopathy or radiculopathy, site unspecified: Secondary | ICD-10-CM | POA: Diagnosis present

## 2021-10-14 DIAGNOSIS — G8929 Other chronic pain: Secondary | ICD-10-CM

## 2021-10-14 DIAGNOSIS — M419 Scoliosis, unspecified: Secondary | ICD-10-CM | POA: Diagnosis present

## 2021-10-14 DIAGNOSIS — K644 Residual hemorrhoidal skin tags: Secondary | ICD-10-CM | POA: Diagnosis not present

## 2021-10-14 DIAGNOSIS — K6289 Other specified diseases of anus and rectum: Secondary | ICD-10-CM | POA: Diagnosis not present

## 2021-10-14 DIAGNOSIS — Z515 Encounter for palliative care: Secondary | ICD-10-CM | POA: Diagnosis not present

## 2021-10-14 DIAGNOSIS — Z66 Do not resuscitate: Secondary | ICD-10-CM | POA: Diagnosis present

## 2021-10-14 DIAGNOSIS — I6501 Occlusion and stenosis of right vertebral artery: Secondary | ICD-10-CM | POA: Diagnosis present

## 2021-10-14 DIAGNOSIS — K5730 Diverticulosis of large intestine without perforation or abscess without bleeding: Secondary | ICD-10-CM | POA: Diagnosis present

## 2021-10-14 DIAGNOSIS — K635 Polyp of colon: Secondary | ICD-10-CM | POA: Diagnosis not present

## 2021-10-14 LAB — BASIC METABOLIC PANEL
Anion gap: 6 (ref 5–15)
BUN: 12 mg/dL (ref 8–23)
CO2: 29 mmol/L (ref 22–32)
Calcium: 8 mg/dL — ABNORMAL LOW (ref 8.9–10.3)
Chloride: 100 mmol/L (ref 98–111)
Creatinine, Ser: 0.72 mg/dL (ref 0.44–1.00)
GFR, Estimated: >60 mL/min (ref >60)
Glucose, Bld: 88 mg/dL (ref 70–99)
Potassium: 4.1 mmol/L (ref 3.5–5.1)
Sodium: 135 mmol/L (ref 135–145)

## 2021-10-14 LAB — CBC
HCT: 27.2 % — ABNORMAL LOW (ref 36.0–46.0)
Hemoglobin: 8 g/dL — ABNORMAL LOW (ref 12.0–15.0)
MCH: 26.9 pg (ref 26.0–34.0)
MCHC: 29.4 g/dL — ABNORMAL LOW (ref 30.0–36.0)
MCV: 91.6 fL (ref 80.0–100.0)
Platelets: 284 10*3/uL (ref 150–400)
RBC: 2.97 MIL/uL — ABNORMAL LOW (ref 3.87–5.11)
RDW: 14.6 % (ref 11.5–15.5)
WBC: 8 10*3/uL (ref 4.0–10.5)
nRBC: 0 % (ref 0.0–0.2)

## 2021-10-14 LAB — HEMOGLOBIN AND HEMATOCRIT, BLOOD
HCT: 28.9 % — ABNORMAL LOW (ref 36.0–46.0)
HCT: 26.3 % — ABNORMAL LOW (ref 36.0–46.0)
Hemoglobin: 8.6 g/dL — ABNORMAL LOW (ref 12.0–15.0)
Hemoglobin: 8.1 g/dL — ABNORMAL LOW (ref 12.0–15.0)

## 2021-10-14 LAB — IRON AND TIBC
Iron: 23 ug/dL — ABNORMAL LOW (ref 28–170)
Saturation Ratios: 6 % — ABNORMAL LOW (ref 10.4–31.8)
TIBC: 399 ug/dL (ref 250–450)
UIBC: 376 ug/dL

## 2021-10-14 LAB — FERRITIN: Ferritin: 10 ng/mL — ABNORMAL LOW (ref 11–307)

## 2021-10-14 LAB — VITAMIN B12: Vitamin B-12: 510 pg/mL (ref 180–914)

## 2021-10-14 LAB — FOLATE: Folate: 13.3 ng/mL (ref >5.9)

## 2021-10-14 MED ORDER — NICOTINE POLACRILEX 2 MG MT GUM
2.0000 mg | CHEWING_GUM | OROMUCOSAL | Status: DC | PRN
  Filled 2021-10-14: qty 1

## 2021-10-14 MED ORDER — SODIUM CHLORIDE 0.9 % IV SOLN: INTRAVENOUS | Status: DC

## 2021-10-14 MED ORDER — SODIUM CHLORIDE 0.9 % IV SOLN
250.0000 mg | Freq: Every day | INTRAVENOUS | Status: AC
  Administered 2021-10-14 – 2021-10-15 (×2): 250 mg via INTRAVENOUS
  Filled 2021-10-14 (×2): qty 20

## 2021-10-14 NOTE — H&P (View-Only) (Signed)
Referring Provider: [facility] ?Primary Care Physician:  Asenso, Philip, MD ?Primary Gastroenterologist:  [facility]/ Dr. Outlaw ? ?Reason for Consultation:  Anemia, dysphagia ? ?HPI: Amy Mckay is a 87 y.o. female with medical history significant for COPD, chronic respiratory failure with hypoxia on 4 L supplemental O2 via , PAD s/p SMA stenting, chronic hyponatremia, HTN, hypothyroidism, GERD, RLS, chronic pain syndrome, memory impairment who presented to the ED for evaluation of dizziness.  ? ?Reports yesterday she began to feel weak and fatigued as well as lightheaded.  Felt "like she was in a different world".  Went to sit down write a note to  her daughter realized she could not remember her name.  This prompted her to present to the ED. ? ?She had 1 episode of regurgitation yesterday after eating.  Notes regurgitation contains small amount of food, no blood, no coffee-ground emesis.  Notes she will have regurgitation a couple times a week due to dysphagia.  She has dysphagia to liquids, pills, solids for 5 years.  She chews her food very carefully takes small bites and avoids foods that worsen her dysphagia.  Reports she will feel food, liquids and pills for the hang on the top of her chest.  She denies weight loss.  She takes PPI twice daily. ? ?She notes epigastric pain intermittently for the last several months. ? ?Notes she had 1 dark brown stool yesterday.  Denies melena.  Notes that most bowel movements she will have small drops of bright red blood per rectum due to hemorrhoids.  FOBT positive in the ED.  No hemorrhoids seen on rectal exam.  Patient did have skin tags. ? ?Denies alcohol use, denies NSAID use, Smokes 1 packs of cigarettes daily.  ? ?She is taking Plavix last dose 3 days ago. ? ?Reports previous colonoscopy and possible EGD at [place] many years ago.  Unable to find records in epic or Care Everywhere. ? ? ?Past Medical History:  ?Diagnosis Date  ? Acid reflux   ? COPD (chronic obstructive  pulmonary disease) (HCC)   ? Hypertension   ? ? ?History reviewed. No pertinent surgical history. ? ?Prior to Admission medications   ?Medication Sig Start Date End Date Taking? Authorizing Provider  ?albuterol (PROVENTIL HFA;VENTOLIN HFA) 108 (90 Base) MCG/ACT inhaler Inhale 2 puffs into the lungs every 6 (six) hours as needed for shortness of breath. 06/27/15  Yes [provider]  ?aspirin EC 81 MG tablet Take 81 mg by mouth daily.   Yes [provider]  ?atorvastatin (LIPITOR) 10 MG tablet Take 10 mg by mouth daily.   Yes [provider]  ?BREZTRI AEROSPHERE 160-9-4.8 MCG/ACT AERO Inhale 2 puffs into the lungs daily.   Yes [provider]  ?Calcium Carb-Cholecalciferol (CALCIUM + VITAMIN D3 PO) Take 1 tablet by mouth daily.   Yes [provider]  ?clopidogrel (PLAVIX) 75 MG tablet Take 75 mg by mouth daily. 06/04/16  Yes [provider]  ?cyclobenzaprine (FLEXERIL) 5 MG tablet Take 5 mg by mouth 3 (three) times daily as needed for muscle spasms. 04/25/16  Yes [provider]  ?esomeprazole (NEXIUM) 40 MG capsule Take 40 mg by mouth daily in the afternoon. 11/01/20  Yes [provider]  ?gabapentin (NEURONTIN) 800 MG tablet Take 800 mg by mouth 3 (three) times daily. 04/25/16  Yes [provider]  ?ibuprofen (ADVIL) 200 MG tablet Take 400 mg by mouth every 6 (six) hours as needed for fever, headache or mild pain.     Yes [provider]  ?levothyroxine (SYNTHROID, LEVOTHROID) 75 MCG tablet Take 75 mcg by mouth daily before breakfast. 02/05/16  Yes [provider]  ?memantine (NAMENDA) 10 MG tablet Take 10 mg by mouth 2 (two) times daily.   Yes [provider]  ?Multiple Vitamin (MULTIVITAMIN WITH MINERALS) TABS tablet Take 1 tablet by mouth daily with breakfast.   Yes [provider]  ?Oxycodone HCl 10 MG TABS Take 10 mg by mouth 4 (four) times daily.   Yes [provider]  ?pramipexole  (MIRAPEX) 0.25 MG tablet Take 0.25 mg by mouth at bedtime. 01/02/21  Yes [provider]  ?vitamin C (ASCORBIC ACID) 500 MG tablet Take 500 mg by mouth daily.   Yes [provider]  ?amLODipine (NORVASC) 5 MG tablet Take 5 mg by mouth daily. ?Patient not taking: Reported on 10/13/2021 11/18/20   [provider]  ?losartan (COZAAR) 100 MG tablet Take 100 mg by mouth daily. ?Patient not taking: Reported on 10/13/2021 12/02/20   [provider]  ? ? ?Scheduled Meds: ? atorvastatin  10 mg Oral Daily  ? gabapentin  800 mg Oral TID  ? levothyroxine  75 mcg Oral QAC breakfast  ? memantine  10 mg Oral BID  ? mometasone-formoterol  2 puff Inhalation BID  ? nicotine  21 mg Transdermal Daily  ? pantoprazole (PROTONIX) IV  40 mg Intravenous Q12H  ? pramipexole  0.25 mg Oral QHS  ? sodium chloride flush  3 mL Intravenous Q12H  ? umeclidinium bromide  1 puff Inhalation Daily  ? ?Continuous Infusions: ? sodium chloride 100 mL/hr at 10/13/21 2221  ? ?PRN Meds:.acetaminophen **OR** acetaminophen, albuterol, ipratropium-albuterol, ondansetron **OR** ondansetron (ZOFRAN) IV, oxyCODONE ? ?Allergies as of 10/13/2021 - Review Complete 10/13/2021  ?Allergen Reaction Noted  ? Penicillins Anaphylaxis and Swelling 05/14/2016  ? ? ?History reviewed. No pertinent family history. ? ?Social History  ? ?Socioeconomic History  ? Marital status: Single  ?  Spouse name: Not on file  ? Number of children: Not on file  ? Years of education: Not on file  ? Highest education level: Not on file  ?Occupational History  ? Not on file  ?Tobacco Use  ? Smoking status: Every Day  ?  Packs/day: 1.50  ?  Types: Cigarettes  ? Smokeless tobacco: Never  ?Vaping Use  ? Vaping Use: Never used  ?Substance and Sexual Activity  ? Alcohol use: No  ? Drug use: No  ? Sexual activity: Not on file  ?Other Topics Concern  ? Not on file  ?Social History Narrative  ? Not on file  ? ?Social Determinants of Health  ? ?Financial Resource Strain: Not on  file  ?Food Insecurity: Not on file  ?Transportation Needs: Not on file  ?Physical Activity: Not on file  ?Stress: Not on file  ?Social Connections: Not on file  ?Intimate Partner Violence: Not on file  ? ? ?Review of Systems: Review of Systems  ?Constitutional:  Positive for malaise/fatigue. Negative for chills and fever.  ?HENT:  Negative for hearing loss and tinnitus.   ?Eyes:  Negative for blurred vision and double vision.  ?Respiratory:  Negative for cough and hemoptysis.   ?Cardiovascular:  Negative for chest pain and palpitations.  ?Gastrointestinal:  Positive for abdominal pain and vomiting. Negative for blood in stool, constipation, diarrhea, heartburn, melena and nausea.  ?Genitourinary:  Negative for dysuria and urgency.  ?Musculoskeletal:  Negative for myalgias and neck pain.  ?Skin:  Negative for itching and  rash.  ?Neurological:  Positive for dizziness and weakness. Negative for headaches.  ?Endo/Heme/Allergies:  Negative for environmental allergies. Does not bruise/bleed easily.  ?Psychiatric/Behavioral:  Negative for depression and suicidal ideas.   ? ? ?Physical Exam:Physical Exam ?Constitutional:   ?   General: She is not in acute distress. ?   Appearance: Normal appearance. She is normal weight.  ?HENT:  ?   Head: Normocephalic and atraumatic.  ?   Right Ear: External ear normal.  ?   Left Ear: External ear normal.  ?   Nose: Nose normal.  ?   Mouth/Throat:  ?   Mouth: Mucous membranes are moist.  ?Eyes:  ?   Pupils: Pupils are equal, round, and reactive to light.  ?   Comments: Conjunctival pallor  ?Cardiovascular:  ?   Rate and Rhythm: Normal rate and regular rhythm.  ?   Pulses: Normal pulses.  ?   Heart sounds: Normal heart sounds.  ?Pulmonary:  ?   Effort: Pulmonary effort is normal.  ?   Breath sounds: Normal breath sounds.  ?Abdominal:  ?   General: Abdomen is flat. Bowel sounds are normal. There is no distension.  ?   Palpations: Abdomen is soft. There is no mass.  ?   Tenderness: There is  no abdominal tenderness. There is no guarding or rebound.  ?   Hernia: No hernia is present.  ?Musculoskeletal:     ?   General: No swelling. Normal range of motion.  ?   Cervical back: Normal range of moti

## 2021-10-14 NOTE — Plan of Care (Signed)
  Problem: Clinical Measurements: Goal: Respiratory complications will improve Outcome: Progressing Goal: Cardiovascular complication will be avoided Outcome: Progressing   Problem: Nutrition: Goal: Adequate nutrition will be maintained Outcome: Progressing   Problem: Elimination: Goal: Will not experience complications related to urinary retention Outcome: Progressing   Problem: Pain Managment: Goal: General experience of comfort will improve Outcome: Progressing   

## 2021-10-14 NOTE — Assessment & Plan Note (Signed)
-   Continue home meds °

## 2021-10-14 NOTE — TOC Initial Note (Addendum)
Transition of Care (TOC) - Initial/Assessment Note  ? ? ?Patient Details  ?Name: Amy Mckay ?MRN: [id number] ?Date of Birth: 15-Feb-1935 ? ?Transition of Care [facility]) CM/SW Contact:    ?Amy Clam, RN ?Phone Number: ?10/14/2021, 3:44 PM ? ?Clinical Narrative:  Spoke to dtr Amy Mckay about d/c plans & recc-d/c plan home if [facility] agency to accept -currently [facility] agencies not able to accept-[facility]. Will continue to check.  ?-4:19p-[facility] HHPT/OT/aide              ? ? ?Expected Discharge Plan: Home w Home Health Services ?Barriers to Discharge: Continued Medical Work up ? ? ?Patient Goals and CMS Choice ?Patient states their goals for this hospitalization and ongoing recovery are:: Home ?CMS Medicare.gov Compare Post Acute Care list provided to:: Patient Represenative (must comment) Amy Mckay dtr) ?Choice offered to / list presented to : Adult Children ? ?Expected Discharge Plan and Services ?Expected Discharge Plan: Home w Home Health Services ?  ?Discharge Planning Services: CM Consult ?Post Acute Care Choice: Home Health ?Living arrangements for the past 2 months: Single Family Home ?                ?  ?  ?  ?  ?  ?  ?  ?  ?  ?  ? ?Prior Living Arrangements/Services ?Living arrangements for the past 2 months: Single Family Home ?Lives with:: Adult Children ?Patient language and need for interpreter reviewed:: Yes ?Do you feel safe going back to the place where you live?: Yes      ?Need for Family Participation in Patient Care: Yes (Comment) ?Care giver support system in place?: Yes (comment) ?Current home services: Homehealth aide, DME (rw,3n1,cane;private duty aide.) ?Criminal Activity/Legal Involvement Pertinent to Current Situation/Hospitalization: No - Comment as needed ? ?Activities of Daily Living ?Home Assistive Devices/Equipment: Oxygen, Dentures (specify type) ?ADL Screening (condition at time of admission) ?Patient's cognitive ability adequate to safely complete daily activities?: Yes ?Is the patient  deaf or have difficulty hearing?: Yes ?Does the patient have difficulty seeing, even when wearing glasses/contacts?: No ?Does the patient have difficulty concentrating, remembering, or making decisions?: Yes ?Patient able to express need for assistance with ADLs?: Yes ?Does the patient have difficulty dressing or bathing?: Yes ?Independently performs ADLs?: No ?Communication: Independent ?Dressing (OT): Needs assistance ?Is this a change from baseline?: Pre-admission baseline ?Grooming: Needs assistance ?Is this a change from baseline?: Pre-admission baseline ?Feeding: Independent ?Bathing: Needs assistance ?Is this a change from baseline?: Pre-admission baseline ?Toileting: Needs assistance ?Is this a change from baseline?: Pre-admission baseline ?In/Out Bed: Needs assistance ?Is this a change from baseline?: Pre-admission baseline ?Walks in Home: Needs assistance ?Is this a change from baseline?: Pre-admission baseline ?Does the patient have difficulty walking or climbing stairs?: Yes ?Weakness of Legs: Both ?Weakness of Arms/Hands: None ? ?Permission Sought/Granted ?Permission sought to share information with : Case Manager ?Permission granted to share information with : Yes, Verbal Permission Granted ? Share Information with NAME: Case manager ?   ?   ?   ? ?Emotional Assessment ?Appearance:: Appears stated age ?Attitude/Demeanor/Rapport: Gracious ?Affect (typically observed): Accepting ?Orientation: : Oriented to Self, Oriented to Place, Oriented to  Time, Oriented to Situation ?Alcohol / Substance Use: Not Applicable ?Psych Involvement: No (comment) ? ?Admission diagnosis:  Weakness [R53.1] ?Near syncope [R55] ?Hypotension, unspecified hypotension type [I95.9] ?Orthostatic hypotension [I95.1] ?Patient Active Problem List  ? Diagnosis Date Noted  ? Esophageal dysphagia 10/14/2021  ? Acute toxic encephalopathy 10/14/2021  ? Orthostatic hypotension 10/14/2021  ?  Near syncope 10/13/2021  ? Iron deficiency anemia  due to chronic blood loss 10/13/2021  ? Chronic respiratory failure with hypoxia (HCC) 10/13/2021  ? Chronic lower back pain 10/13/2021  ? Tobacco use disorder 10/13/2021  ? Pulmonary nodules/lesions, multiple 10/13/2021  ? Acute on chronic respiratory failure with hypoxia (HCC) 02/02/2021  ? COVID-19 virus infection 02/01/2021  ? AKI (acute kidney injury) (HCC) 02/01/2021  ? Peripheral arterial disease (HCC) 02/01/2021  ? Benign essential HTN 02/01/2021  ? COPD (chronic obstructive pulmonary disease) (HCC) 02/01/2021  ? Cigarette smoker 02/01/2021  ? Hypothyroidism 02/01/2021  ? Memory impairment 02/01/2021  ? Restless leg 02/01/2021  ? Chronic hyponatremia 02/01/2021  ? Colitis 01/29/2021  ? ?PCP:  Annita Brod, MD ?Pharmacy:   ?[facility] - [place] - [address] [address] ?[address] ?[place] [address] ?Phone: [phone] Fax: [phone] ? ? ? ? ?Social Determinants of Health (SDOH) Interventions ?  ? ?Readmission Risk Interventions ?   ? View : No data to display.  ?  ?  ?  ? ? ? ?

## 2021-10-14 NOTE — Assessment & Plan Note (Signed)
Per patient's daughter, patient was somewhat delusional when she called EMS.  Patient recalls the incident. She has history of cognitive impairment although this is a difficult diagnosis to make while she is on high-dose opiate and gabapentin.  She is on Namenda.  CT head without acute finding.  No focal neurodeficit to suggest CVA.  Low suspicion for infectious process.  She is currently oriented x4.  ?-Reorientation and delirium precautions ?-Fall precaution ?-PT/OT eval ?

## 2021-10-14 NOTE — Assessment & Plan Note (Signed)
Reportedly had an episode of regurgitation after eating yesterday.  Not new.  Esophagram somewhat limited examination but concerning for esophageal dysmotility. ?-Defer to GI. ?-Continue clear liquid diet ?

## 2021-10-14 NOTE — Evaluation (Addendum)
Physical Therapy Evaluation ?Patient Details ?Name: Amy Mckay ?MRN: [id number] ?DOB: Aug 25, 1934 ?Today's Date: 10/14/2021 ? ?History of Present Illness ? Amy Mckay is a 87 y.o. female with medical history significant for COPD, chronic respiratory failure with hypoxia on 4 L supplemental O2 via [place], PAD s/p SMA stenting, chronic hyponatremia, HTN, hypothyroidism, GERD, RLS, chronic pain syndrome, memory impairment who presented to the ED 10/13/21  for evaluation of dizziness  ?Clinical Impression ? Patient  pleasantly confused, resides at home with  daughter who is disabled and unable to assist Patient's granddaughter supportive and has HH aid and RN ~ 5 hrs/day, 6 days a week. No family to confirm the information. Patient should be able to return home with 24/7  assistance, if not, may benefit from SNF.   ?Pt admitted with above diagnosis.  Pt currently with functional limitations due to the deficits listed below (see PT Problem List). Pt will benefit from skilled PT to increase their independence and safety with mobility to allow discharge to the venue listed below.   ? Orthostatic BP's taken and placed in Flow sheets. BP did not drop and patient was able to get to [facility]. ? ?   ? ?Recommendations for follow up therapy are one component of a multi-disciplinary discharge planning process, led by the attending physician.  Recommendations may be updated based on patient status, additional functional criteria and insurance authorization. ? ?Follow Up Recommendations Home health PT vs SNF-depending on caregiver support. ? ?  ?Assistance Recommended at Discharge Frequent or constant Supervision/Assistance  ?Patient can return home with the following ? A little help with walking and/or transfers;A little help with bathing/dressing/bathroom;Help with stairs or ramp for entrance;Assist for transportation;Assistance with cooking/housework ? ?  ?Equipment Recommendations None recommended by PT  ?Recommendations for Other  Services ?    ?  ?Functional Status Assessment Patient has had a recent decline in their functional status and demonstrates the ability to make significant improvements in function in a reasonable and predictable amount of time.  ? ?  ?Precautions / Restrictions Precautions ?Precautions: Fall ?Precaution Comments: has been orthostatic but not 5/2 on eval  ? ?  ? ?Mobility ? Bed Mobility ?Overal bed mobility: Needs Assistance ?Bed Mobility: Supine to Sit, Sit to Supine ?  ?  ?Supine to sit: Min guard, HOB elevated ?Sit to supine: Min guard ?  ?General bed mobility comments: extra time and cues ?  ? ?Transfers ?Overall transfer level: Needs assistance ?Equipment used: Rolling walker (2 wheels) ?Transfers: Sit to/from Stand, Bed to chair/wheelchair/BSC ?Sit to Stand: Min assist ?  ?Step pivot transfers: Min assist ?  ?  ?  ?General transfer comment: cues for safety, use of RW for support, self distracting with talking ?  ? ?Ambulation/Gait ?  ?  ?  ?  ?  ?  ?  ?  ? ?Stairs ?  ?  ?  ?  ?  ? ?Wheelchair Mobility ?  ? ?Modified Rankin (Stroke Patients Only) ?  ? ?  ? ?Balance Overall balance assessment: Needs assistance ?Sitting-balance support: Bilateral upper extremity supported, Feet supported ?Sitting balance-Leahy Scale: Good ?  ?  ?Standing balance support: During functional activity, Single extremity supported ?Standing balance-Leahy Scale: Fair ?  ?  ?  ?  ?  ?  ?  ?  ?  ?  ?  ?  ?   ? ? ? ?Pertinent Vitals/Pain Pain Assessment ?Pain Assessment: No/denies pain  ? ? ?Home Living Family/patient expects to be discharged to::  Private residence ?Living Arrangements: Children;Other relatives ?Available Help at Discharge: Personal care attendant;Available PRN/intermittently;Family;Home health ?Type of Home: House ?Home Access: Stairs to enter ?Entrance Stairs-Rails: Right ?Entrance Stairs-Number of Steps: 5 ?  ?Home Layout: One level ?Home Equipment: Rollator (4 wheels) ?   ?  ?Prior Function Prior Level of Function :  Needs assist ? Cognitive Assist : ADLs (cognitive) ?  ?ADLs (Cognitive): Set up cues ?  ?  ?  ?Mobility Comments: patient lives with disable d daughter, has  HH assist 5 hours /day 6 days/week, no help at night. Granddaughter assists with transportation. ?  ?  ? ? ?Hand Dominance  ? Dominant Hand: Right ? ?  ?Extremity/Trunk Assessment  ? Upper Extremity Assessment ?Upper Extremity Assessment: Overall WFL for tasks assessed ?  ? ?Lower Extremity Assessment ?Lower Extremity Assessment: Generalized weakness ?  ? ?Cervical / Trunk Assessment ?Cervical / Trunk Assessment: Normal  ?Communication  ? Communication: HOH  ?Cognition Arousal/Alertness: Awake/alert ?Behavior During Therapy: [facility] for tasks assessed/performed ?Overall Cognitive Status: History of cognitive impairments - at baseline ?  ?  ?  ?  ?  ?  ?  ?  ?  ?  ?  ?  ?  ?  ?  ?  ?General Comments: tangential ?  ?  ? ?  ?General Comments   ? ?  ?Exercises    ? ?Assessment/Plan  ?  ?PT Assessment Patient needs continued PT services  ?PT Problem List Decreased strength;Decreased mobility;Decreased safety awareness;Decreased activity tolerance;Decreased cognition;Decreased knowledge of precautions;Decreased balance;Decreased knowledge of use of DME ? ?   ?  ?PT Treatment Interventions DME instruction;Therapeutic activities;Gait training;Therapeutic exercise;Cognitive remediation;Patient/family education;Functional mobility training   ? ?PT Goals (Current goals can be found in the Care Plan section)  ?Acute Rehab PT Goals ?Patient Stated Goal: wants to go home ?PT Goal Formulation: With patient ?Time For Goal Achievement: 10/28/21 ?Potential to Achieve Goals: Good ? ?  ?Frequency Min 3X/week ?  ? ? ?Co-evaluation PT/OT/SLP Co-Evaluation/Treatment: Yes ?Reason for Co-Treatment: For patient/therapist safety ?PT goals addressed during session: Mobility/safety with mobility ?OT goals addressed during session: ADL's and self-care ?  ? ? ?  ?AM-PAC PT "6 Clicks" Mobility   ?Outcome Measure Help needed turning from your back to your side while in a flat bed without using bedrails?: None ?Help needed moving from lying on your back to sitting on the side of a flat bed without using bedrails?: None ?Help needed moving to and from a bed to a chair (including a wheelchair)?: A Little ?Help needed standing up from a chair using your arms (e.g., wheelchair or bedside chair)?: A Little ?Help needed to walk in hospital room?: A Lot ?Help needed climbing 3-5 steps with a railing? : A Lot ?6 Click Score: 18 ? ?  ?End of Session Equipment Utilized During Treatment: Gait belt ?Activity Tolerance: Patient tolerated treatment well ?Patient left: in bed;with call bell/phone within reach;with bed alarm set ?Nurse Communication: Mobility status ?PT Visit Diagnosis: Unsteadiness on feet (R26.81);Dizziness and giddiness (R42) ?  ? ?Time: 7681-1572 ?PT Time Calculation (min) (ACUTE ONLY): 30 min ? ? ?Charges:   PT Evaluation ?$PT Eval Low Complexity: 1 Low ?  ?  ?   ? ? ?Blanchard Kelch PT ?Acute Rehabilitation Services ?Pager [phone] ?Office [phone] ? ? ?Rada Hay ?10/14/2021, 3:06 PM ? ?

## 2021-10-14 NOTE — Assessment & Plan Note (Signed)
Repeat CT without contrast in 3 to 6 months recommended.  Patient is considered high risk given lifelong smoking. ?

## 2021-10-14 NOTE — Consult Note (Signed)
Referring Provider: TRH ?Primary Care Physician:  Annita BrodAsenso, Philip, MD ?Primary Gastroenterologist:  [facility]/ Dr. Dulce Sellarutlaw ? ?Reason for Consultation:  Anemia, dysphagia ? ?HPI: Amy Mckay is a 87 y.o. female with medical history significant for COPD, chronic respiratory failure with hypoxia on 4 L supplemental O2 via [place], PAD s/p SMA stenting, chronic hyponatremia, HTN, hypothyroidism, GERD, RLS, chronic pain syndrome, memory impairment who presented to the ED for evaluation of dizziness.  ? ?Reports yesterday she began to feel weak and fatigued as well as lightheaded.  Felt "like she was in a different world".  Went to sit down write a note to  her daughter realized she could not remember her name.  This prompted her to present to the ED. ? ?She had 1 episode of regurgitation yesterday after eating.  Notes regurgitation contains small amount of food, no blood, no coffee-ground emesis.  Notes she will have regurgitation a couple times a week due to dysphagia.  She has dysphagia to liquids, pills, solids for 5 years.  She chews her food very carefully takes small bites and avoids foods that worsen her dysphagia.  Reports she will feel food, liquids and pills for the hang on the top of her chest.  She denies weight loss.  She takes PPI twice daily. ? ?She notes epigastric pain intermittently for the last several months. ? ?Notes she had 1 dark brown stool yesterday.  Denies melena.  Notes that most bowel movements she will have small drops of bright red blood per rectum due to hemorrhoids.  FOBT positive in the ED.  No hemorrhoids seen on rectal exam.  Patient did have skin tags. ? ?Denies alcohol use, denies NSAID use, Smokes 1 packs of cigarettes daily.  ? ?She is taking Plavix last dose 3 days ago. ? ?Reports previous colonoscopy and possible EGD at [facility] many years ago.  Unable to find records in epic or Care Everywhere. ? ? ?Past Medical History:  ?Diagnosis Date  ? Acid reflux   ? COPD (chronic obstructive  pulmonary disease) (HCC)   ? Hypertension   ? ? ?History reviewed. No pertinent surgical history. ? ?Prior to Admission medications   ?Medication Sig Start Date End Date Taking? Authorizing Provider  ?albuterol (PROVENTIL HFA;VENTOLIN HFA) 108 (90 Base) MCG/ACT inhaler Inhale 2 puffs into the lungs every 6 (six) hours as needed for shortness of breath. 06/27/15  Yes [provider]  ?aspirin EC 81 MG tablet Take 81 mg by mouth daily.   Yes [provider]  ?atorvastatin (LIPITOR) 10 MG tablet Take 10 mg by mouth daily.   Yes [provider]  ?BREZTRI AEROSPHERE 160-9-4.8 MCG/ACT AERO Inhale 2 puffs into the lungs daily.   Yes [provider]  ?Calcium Carb-Cholecalciferol (CALCIUM + VITAMIN D3 PO) Take 1 tablet by mouth daily.   Yes [provider]  ?clopidogrel (PLAVIX) 75 MG tablet Take 75 mg by mouth daily. 06/04/16  Yes [provider]  ?cyclobenzaprine (FLEXERIL) 5 MG tablet Take 5 mg by mouth 3 (three) times daily as needed for muscle spasms. 04/25/16  Yes [provider]  ?esomeprazole (NEXIUM) 40 MG capsule Take 40 mg by mouth daily in the afternoon. 11/01/20  Yes [provider]  ?gabapentin (NEURONTIN) 800 MG tablet Take 800 mg by mouth 3 (three) times daily. 04/25/16  Yes [provider]  ?ibuprofen (ADVIL) 200 MG tablet Take 400 mg by mouth every 6 (six) hours as needed for fever, headache or mild pain.  Yes [provider]  ?levothyroxine (SYNTHROID, LEVOTHROID) 75 MCG tablet Take 75 mcg by mouth daily before breakfast. 02/05/16  Yes [provider]  ?memantine (NAMENDA) 10 MG tablet Take 10 mg by mouth 2 (two) times daily.   Yes [provider]  ?Multiple Vitamin (MULTIVITAMIN WITH MINERALS) TABS tablet Take 1 tablet by mouth daily with breakfast.   Yes [provider]  ?Oxycodone HCl 10 MG TABS Take 10 mg by mouth 4 (four) times daily.   Yes [provider]  ?pramipexole  (MIRAPEX) 0.25 MG tablet Take 0.25 mg by mouth at bedtime. 01/02/21  Yes [provider]  ?vitamin C (ASCORBIC ACID) 500 MG tablet Take 500 mg by mouth daily.   Yes [provider]  ?amLODipine (NORVASC) 5 MG tablet Take 5 mg by mouth daily. ?Patient not taking: Reported on 10/13/2021 11/18/20   [provider]  ?losartan (COZAAR) 100 MG tablet Take 100 mg by mouth daily. ?Patient not taking: Reported on 10/13/2021 12/02/20   [provider]  ? ? ?Scheduled Meds: ? atorvastatin  10 mg Oral Daily  ? gabapentin  800 mg Oral TID  ? levothyroxine  75 mcg Oral QAC breakfast  ? memantine  10 mg Oral BID  ? mometasone-formoterol  2 puff Inhalation BID  ? nicotine  21 mg Transdermal Daily  ? pantoprazole (PROTONIX) IV  40 mg Intravenous Q12H  ? pramipexole  0.25 mg Oral QHS  ? sodium chloride flush  3 mL Intravenous Q12H  ? umeclidinium bromide  1 puff Inhalation Daily  ? ?Continuous Infusions: ? sodium chloride 100 mL/hr at 10/13/21 2221  ? ?PRN Meds:.acetaminophen **OR** acetaminophen, albuterol, ipratropium-albuterol, ondansetron **OR** ondansetron (ZOFRAN) IV, oxyCODONE ? ?Allergies as of 10/13/2021 - Review Complete 10/13/2021  ?Allergen Reaction Noted  ? Penicillins Anaphylaxis and Swelling 05/14/2016  ? ? ?History reviewed. No pertinent family history. ? ?Social History  ? ?Socioeconomic History  ? Marital status: Single  ?  Spouse name: Not on file  ? Number of children: Not on file  ? Years of education: Not on file  ? Highest education level: Not on file  ?Occupational History  ? Not on file  ?Tobacco Use  ? Smoking status: Every Day  ?  Packs/day: 1.50  ?  Types: Cigarettes  ? Smokeless tobacco: Never  ?Vaping Use  ? Vaping Use: Never used  ?Substance and Sexual Activity  ? Alcohol use: No  ? Drug use: No  ? Sexual activity: Not on file  ?Other Topics Concern  ? Not on file  ?Social History Narrative  ? Not on file  ? ?Social Determinants of Health  ? ?Financial Resource Strain: Not on  file  ?Food Insecurity: Not on file  ?Transportation Needs: Not on file  ?Physical Activity: Not on file  ?Stress: Not on file  ?Social Connections: Not on file  ?Intimate Partner Violence: Not on file  ? ? ?Review of Systems: Review of Systems  ?Constitutional:  Positive for malaise/fatigue. Negative for chills and fever.  ?HENT:  Negative for hearing loss and tinnitus.   ?Eyes:  Negative for blurred vision and double vision.  ?Respiratory:  Negative for cough and hemoptysis.   ?Cardiovascular:  Negative for chest pain and palpitations.  ?Gastrointestinal:  Positive for abdominal pain and vomiting. Negative for blood in stool, constipation, diarrhea, heartburn, melena and nausea.  ?Genitourinary:  Negative for dysuria and urgency.  ?Musculoskeletal:  Negative for myalgias and neck pain.  ?Skin:  Negative for itching and  rash.  ?Neurological:  Positive for dizziness and weakness. Negative for headaches.  ?Endo/Heme/Allergies:  Negative for environmental allergies. Does not bruise/bleed easily.  ?Psychiatric/Behavioral:  Negative for depression and suicidal ideas.   ? ? ?Physical Exam:Physical Exam ?Constitutional:   ?   General: She is not in acute distress. ?   Appearance: Normal appearance. She is normal weight.  ?HENT:  ?   Head: Normocephalic and atraumatic.  ?   Right Ear: External ear normal.  ?   Left Ear: External ear normal.  ?   Nose: Nose normal.  ?   Mouth/Throat:  ?   Mouth: Mucous membranes are moist.  ?Eyes:  ?   Pupils: Pupils are equal, round, and reactive to light.  ?   Comments: Conjunctival pallor  ?Cardiovascular:  ?   Rate and Rhythm: Normal rate and regular rhythm.  ?   Pulses: Normal pulses.  ?   Heart sounds: Normal heart sounds.  ?Pulmonary:  ?   Effort: Pulmonary effort is normal.  ?   Breath sounds: Normal breath sounds.  ?Abdominal:  ?   General: Abdomen is flat. Bowel sounds are normal. There is no distension.  ?   Palpations: Abdomen is soft. There is no mass.  ?   Tenderness: There is  no abdominal tenderness. There is no guarding or rebound.  ?   Hernia: No hernia is present.  ?Musculoskeletal:     ?   General: No swelling. Normal range of motion.  ?   Cervical back: Normal range of moti

## 2021-10-14 NOTE — Evaluation (Signed)
Occupational Therapy Evaluation ?Patient Details ?Name: Amy BeltonDelores Mckay ?MRN: [id number] ?DOB: [birth year] ?Today's Date: 10/14/2021 ? ? ?History of Present Illness Amy Mckay is a 87 y.o. female with medical history significant for COPD, chronic respiratory failure with hypoxia on 4 L supplemental O2 via [place], PAD s/p SMA stenting, chronic hyponatremia, HTN, hypothyroidism, GERD, RLS, chronic pain syndrome, memory impairment who presented to the ED 10/13/21  for evaluation of dizziness  ? ?Clinical Impression ?  ?Patient is a 87 year old female who was noted to be admitted for above. Patient was noted to have decreased functional activity tolerance, decreased endurance, decreased standing balance, decreased safety awareness,  and decreased knowledge of AD/AE impacting participation in Adls. Patient was noted to have history of orthostatic BP. Patient recommended to have 24/7 caregiver support in next level of care to be successful. Patient would continue to benefit from skilled OT services at this time while admitted and after d/c to address noted deficits in order to improve overall safety and independence in ADLs.  ? ? ?Blood pressures:  ?Supine: 111/80 mmhg ?Sitting 92/80 mmhg ?Standing: 100/75 mmhg   ? ?Recommendations for follow up therapy are one component of a multi-disciplinary discharge planning process, led by the attending physician.  Recommendations may be updated based on patient status, additional functional criteria and insurance authorization.  ? ?Follow Up Recommendations ? Home health OT (v.s. SNF pending caregiver support)  ?  ?Assistance Recommended at Discharge    ?Patient can return home with the following A little help with walking and/or transfers;A little help with bathing/dressing/bathroom;Assistance with cooking/housework;Direct supervision/assist for financial management;Assist for transportation;Help with stairs or ramp for entrance;Direct supervision/assist for medications management ? ?   ?Functional Status Assessment ? Patient has had a recent decline in their functional status and demonstrates the ability to make significant improvements in function in a reasonable and predictable amount of time.  ?Equipment Recommendations ? None recommended by OT  ?  ?Recommendations for Other Services   ? ? ?  ?Precautions / Restrictions Precautions ?Precautions: Fall ?Precaution Comments: has been orthostatic but not 5/2 on eval ?Restrictions ?Weight Bearing Restrictions: No  ? ?  ? ?Mobility Bed Mobility ?Overal bed mobility: Needs Assistance ?Bed Mobility: Supine to Sit, Sit to Supine ?  ?  ?Supine to sit: Min guard, HOB elevated ?Sit to supine: Min guard ?  ?General bed mobility comments: extra time and cues ?  ? ?Transfers ?  ?  ?  ?  ?  ?  ?  ?  ?  ?  ?  ? ?  ?Balance Overall balance assessment: Needs assistance ?Sitting-balance support: Bilateral upper extremity supported, Feet supported ?Sitting balance-Leahy Scale: Good ?  ?  ?Standing balance support: During functional activity, Single extremity supported ?Standing balance-Leahy Scale: Fair ?  ?  ?  ?  ?  ?  ?  ?  ?  ?  ?  ?  ?   ? ?ADL either performed or assessed with clinical judgement  ? ?ADL Overall ADL's : Needs assistance/impaired ?Eating/Feeding: Set up;Sitting ?  ?Grooming: Therapist, nutritionalWash/dry face;Set up;Sitting ?Grooming Details (indicate cue type and reason): on commode ?Upper Body Bathing: Set up;Sitting ?  ?Lower Body Bathing: Sit to/from stand;Moderate assistance ?  ?Upper Body Dressing : Set up;Sitting ?  ?Lower Body Dressing: Moderate assistance;Sit to/from stand ?  ?Toilet Transfer: Minimal assistance;+2 for safety/equipment;BSC/3in1;Ambulation ?Toilet Transfer Details (indicate cue type and reason): with increased cues to keep UE on walker for complete turn. patient needed same cues  on way back to bed. ?Toileting- Architect and Hygiene: Moderate assistance;Sit to/from stand ?  ?  ?  ?Functional mobility during ADLs: Minimal  assistance;Rolling walker (2 wheels);+2 for safety/equipment ?   ? ? ? ?Vision Patient Visual Report: No change from baseline ?   ?   ?Perception   ?  ?Praxis   ?  ? ?Pertinent Vitals/Pain Pain Assessment ?Pain Assessment: No/denies pain  ? ? ? ?Hand Dominance Right ?  ?Extremity/Trunk Assessment Upper Extremity Assessment ?Upper Extremity Assessment: Overall WFL for tasks assessed ?  ?Lower Extremity Assessment ?Lower Extremity Assessment: Defer to PT evaluation ?  ?Cervical / Trunk Assessment ?Cervical / Trunk Assessment: Normal ?  ?Communication Communication ?Communication: HOH ?  ?Cognition Arousal/Alertness: Awake/alert ?Behavior During Therapy: [facility] for tasks assessed/performed ?Overall Cognitive Status: History of cognitive impairments - at baseline ?  ?  ?  ?  ?  ?  ?  ?  ?  ?  ?  ?  ?  ?  ?  ?  ?General Comments: tangential. noted to struggle with orientation questions and uses humor to cover for it. patient noted to have poor safety awareness. ?  ?  ?General Comments    ? ?  ?Exercises   ?  ?Shoulder Instructions    ? ? ?Home Living Family/patient expects to be discharged to:: Private residence ?Living Arrangements: Children;Other relatives ?Available Help at Discharge: Personal care attendant;Available PRN/intermittently;Family;Home health ?Type of Home: House ?Home Access: Stairs to enter ?Entrance Stairs-Number of Steps: 5 ?Entrance Stairs-Rails: Right ?Home Layout: One level ?  ?  ?Bathroom Shower/Tub: Tub/shower unit ?  ?Bathroom Toilet: Standard ?  ?  ?Home Equipment: Rollator (4 wheels) ?  ?  ?  ? ?  ?Prior Functioning/Environment Prior Level of Function : Needs assist ? Cognitive Assist : ADLs (cognitive) ?  ?ADLs (Cognitive): Set up cues ?  ?  ?  ?Mobility Comments: patient lives with disable d daughter, has  HH assist 5 hours /day 6 days/week, no help at night. Granddaughter assists with transportation. ?  ?  ? ?  ?  ?OT Problem List: Decreased activity tolerance;Impaired balance (sitting and/or  standing);Decreased safety awareness;Decreased knowledge of use of DME or AE ?  ?   ?OT Treatment/Interventions: Self-care/ADL training;Therapeutic exercise;DME and/or AE instruction;Therapeutic activities;Patient/family education;Balance training  ?  ?OT Goals(Current goals can be found in the care plan section) Acute Rehab OT Goals ?Patient Stated Goal: to go home ?OT Goal Formulation: Patient unable to participate in goal setting ?Time For Goal Achievement: 10/28/21 ?Potential to Achieve Goals: Good  ?OT Frequency: Min 2X/week ?  ? ?Co-evaluation PT/OT/SLP Co-Evaluation/Treatment: Yes ?Reason for Co-Treatment: For patient/therapist safety ?PT goals addressed during session: Mobility/safety with mobility ?OT goals addressed during session: ADL's and self-care ?  ? ?  ?AM-PAC OT "6 Clicks" Daily Activity     ?Outcome Measure Help from another person eating meals?: None ?Help from another person taking care of personal grooming?: A Little ?Help from another person toileting, which includes using toliet, bedpan, or urinal?: A Lot ?Help from another person bathing (including washing, rinsing, drying)?: A Lot ?Help from another person to put on and taking off regular upper body clothing?: A Little ?Help from another person to put on and taking off regular lower body clothing?: A Lot ?6 Click Score: 16 ?  ?End of Session Equipment Utilized During Treatment: Gait belt;Rolling walker (2 wheels) ?Nurse Communication: Other (comment) (present during portion of session) ? ?Activity Tolerance: Patient tolerated treatment well ?  Patient left: in bed;with call bell/phone within reach;with bed alarm set ? ?OT Visit Diagnosis: Unsteadiness on feet (R26.81);History of falling (Z91.81)  ?              ?Time: 1497-0263 ?OT Time Calculation (min): 25 min ?Charges:  OT General Charges ?$OT Visit: 1 Visit ?OT Evaluation ?$OT Eval Moderate Complexity: 1 Mod ? ?Kyngston Pickelsimer OTR/L, MS ?Acute Rehabilitation Department ?Office#  [phone] ?Pager# [phone] ? ? ?Barnabas Lister Breionna Punt ?10/14/2021, 3:44 PM ?

## 2021-10-14 NOTE — Progress Notes (Signed)
?PROGRESS NOTE ? ?Amy BeltonDelores Mckay UJW:[id number] DOB: [birth year]  ? ?PCP: Annita BrodAsenso, Philip, MD ? ?Patient is from: Home.  Lives with his daughter.  Uses rolling walker at baseline. ? ?DOA: 10/13/2021 LOS: 0 ? ?Chief complaints ?Chief Complaint  ?Patient presents with  ? Weakness  ? Altered Mental Status  ?  ? ?Brief Narrative / Interim history: ?[name] with PMH of COPD/chronic RF on 3 to 4 L, PAD/SMA stent, chronic hyponatremia, RLS, HTN, hypothyroidism, GERD, chronic pain syndrome and cognitive impairment presenting with dizziness, shortness of breath and possible melena.  She was hypotensive to 84/63 on arrival.  Hgb 9.3 (12.8 in 01/2021).  Hemoccult positive.  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  Patient was admitted for near syncope, possible blood loss anemia with positive Hemoccult.  GI consulted.  ? ?Subjective: ?Seen and examined earlier this morning.  No major events overnight of this morning.  She feels better from breathing standpoint.  Continues to endorse productive cough with clear and yellowish phlegm.  She denies hemoptysis.  Denies dizziness this morning.  She is oriented x4.  ? ?Objective: ?Vitals:  ? 10/14/21 0619 10/14/21 0735 10/14/21 0814 10/14/21 1132  ?BP:    93/63  ?Pulse:    77  ?Resp:      ?Temp:      ?TempSrc:      ?SpO2:   92% 96%  ?Weight: 51.8 kg 50.1 kg    ?Height:      ? ? ?Examination: ? ?GENERAL: No apparent distress.  Nontoxic. ?HEENT: MMM.  Vision and hearing grossly intact.  ?NECK: Supple.  No apparent JVD.  ?RESP:  No IWOB.  Fair aeration bilaterally. ?CVS:  RRR. Heart sounds normal.  ?ABD/GI/GU: BS+. Abd soft, NTND.  ?MSK/EXT:  Moves extremities. No apparent deformity. No edema.  ?SKIN: no apparent skin lesion or wound ?NEURO: Awake, alert and oriented appropriately.  No apparent focal  neuro deficit other than BLE weakness. ?PSYCH: Calm. Normal affect.  ? ?Procedures:  ?None ? ?Microbiology summarized: ?COVID-19 and influenza PCR noncreative ? ?Assessment and Plan: ?* Near syncope ?Multifactorial including orthostatic hypotension, possible acute blood loss anemia and possibly from chronically occluded right vertebral artery.  Orthostatic vitals positive.  She is also at risk for polypharmacy on oxycodone, gabapentin and Flexeril.  Reportedly not taking antihypertensive meds. ?-Continue IV fluid hydration ?-Recheck orthostatic vitals. ?-Check carotid US ?-Monitor H&H. ?-PT/OT eval ? ?Esophageal dysphagia ?Reportedly had an episode of regurgitation after eating yesterday.  Not new.  Esophagram somewhat limited examination but concerning for esophageal dysmotility. ?-Defer to GI. ?-Continue clear liquid diet ? ?Iron deficiency anemia due to chronic blood loss ?Recent Labs  ?  01/29/21 ?0637 01/30/21 ?[id number] 01/31/21 ?0410 02/01/21 ?0420 10/13/21 ?1641 10/14/21 ?0503 10/14/21 ?0825  ?HGB 12.1 11.7* 12.6 12.8 9.3* 8.0* 8.6*  ?Hemoccult positive. She seems to have severe iron deficiency suggesting some degree of blood loss anemia.  She is on Plavix and aspirin due to PAD.  Hgb stable after initial drop.  There could be some element of hemodilution. ?-GI following-no plan of EGD as of now ?-Monitor H&H.  Transfuse for Hgb <7.0 ?-IV ferric gluconate 250 mg daily x2 ?-Continue holding Plavix and aspirin. ?-Continue IV Protonix 40 mg twice daily. ?-Continue clear liquid diet ? ?Acute toxic encephalopathy ?Per patient's daughter, patient was somewhat delusional when she called EMS.  Patient recalls  the incident. She has history of cognitive impairment although this is a difficult diagnosis to make while she is on high-dose opiate and gabapentin.  She is on Namenda.  CT head without acute finding.  No focal neurodeficit to suggest CVA.  Low suspicion for infectious process.  She is currently oriented x4.   ?-Reorientation and delirium precautions ?-Fall precaution ?-PT/OT eval ? ?Chronic lower back pain ?She has advanced DDD and facet disease and lumbar spines with leftward scoliosis.  She has BLE weakness.  No new focal neurodeficit, bowel or bladder habit change.  She is on high-dose of oxycodone and gabapentin. She is also in Flexeril.  She is at risk for polypharmacy.  We discussed risk with patient and patient's daughter.  She is willing to stop Flexeril for now. ?-Continue home oxycodone and gabapentin ?-Discontinued Flexeril. ?-I encouraged her to discuss with her prescribers ? ?COPD (chronic obstructive pulmonary disease) (HCC) ?Reportedly had wheezing on presentation.  Shortness of breath improved.  She reports productive cough with clear and yellowish phlegm.  Seems to be stable on home 4 L from saturation standpoint. ?-Continue maintenance therapy with Incruse/Dulera ?-DuoNebs and albuterol as needed ?-Continue supplemental O2 to maintain SPO2 >88% ?-Smoking cessation advised ? ?Chronic respiratory failure with hypoxia (HCC) ?Uses 4 L O2 via [place] chronically, SPO2 stable on home O2. ? ?Peripheral arterial disease (HCC) ?S/p remote SMA stenting.   ?-Holding aspirin and Plavix in the setting of positive Hemoccult. ?-Continue statin ? ?Pulmonary nodules/lesions, multiple ?Repeat CT without contrast in 3 to 6 months recommended.  Patient is considered high risk given lifelong smoking. ? ?Tobacco use disorder ?Lifelong smoker.  Smokes about a pack a day ?-Encourage tobacco cessation. ?-Continue nicotine patch ? ?Chronic hyponatremia ?Resolved.  ? ?Restless leg ?Continue home meds. ? ?Hypothyroidism ?Continue Synthroid. ? ?Benign essential HTN ?Reportedly not taking amlodipine and losartan.  Normotensive now ? ? ? ?DVT prophylaxis:  ?SCDs Start: 10/13/21 2016 in the setting of possible bleed ? ?Code Status: DNR/DNI ?Family Communication: Updated patient's daughter by phone. ?Level of care: Telemetry ? ?Status is:  Observation ?The patient will require care spanning > 2 midnights and should be moved to inpatient because: Due to orthostatic hypotension requiring IV fluid, symptomatic anemia with possible GI bleed ? ? ?Final disposition: TBD after PT/OT eval ?Consultants:  ?Gastroenterology ? ?Sch Meds:  ?Scheduled Meds: ? atorvastatin  10 mg Oral Daily  ? gabapentin  800 mg Oral TID  ? levothyroxine  75 mcg Oral QAC breakfast  ? memantine  10 mg Oral BID  ? mometasone-formoterol  2 puff Inhalation BID  ? nicotine  21 mg Transdermal Daily  ? pantoprazole (PROTONIX) IV  40 mg Intravenous Q12H  ? pramipexole  0.25 mg Oral QHS  ? sodium chloride flush  3 mL Intravenous Q12H  ? umeclidinium bromide  1 puff Inhalation Daily  ? ?Continuous Infusions: ? sodium chloride    ? ferric gluconate (FERRLECIT) IVPB 250 mg (10/14/21 1014)  ? ?PRN Meds:.acetaminophen **OR** acetaminophen, albuterol, ipratropium-albuterol, ondansetron **OR** ondansetron (ZOFRAN) IV, oxyCODONE ? ?Antimicrobials: ?Anti-infectives (From admission, onward)  ? ? None  ? ?  ? ? ? ?I have personally reviewed the following labs and images: ?CBC: ?Recent Labs  ?Lab 10/13/21 ?1641 10/14/21 ?0503 10/14/21 ?0825  ?WBC 9.7 8.0  --   ?NEUTROABS 7.5  --   --   ?HGB 9.3* 8.0* 8.6*  ?HCT 30.6* 27.2* 28.9*  ?MCV 90.0 91.6  --   ?PLT 340 284  --   ? ?  BMP &GFR ?Recent Labs  ?Lab 10/13/21 ?1641 10/14/21 ?0503  ?NA 132* 135  ?K 4.3 4.1  ?CL 94* 100  ?CO2 32 29  ?GLUCOSE 122* 88  ?BUN 19 12  ?CREATININE 0.88 0.72  ?CALCIUM 8.5* 8.0*  ? ?Estimated Creatinine Clearance: 38.8 mL/min (by C-G formula based on SCr of 0.72 mg/dL). ?Liver & Pancreas: ?Recent Labs  ?Lab 10/13/21 ?1641  ?AST 20  ?ALT 14  ?ALKPHOS 72  ?BILITOT 0.2*  ?PROT 7.8  ?ALBUMIN 4.0  ? ?Recent Labs  ?Lab 10/13/21 ?1641  ?LIPASE 36  ? ?No results for input(s): AMMONIA in the last 168 hours. ?Diabetic: ?No results for input(s): HGBA1C in the last 72 hours. ?No results for input(s): GLUCAP in the last 168 hours. ?Cardiac  Enzymes: ?No results for input(s): CKTOTAL, CKMB, CKMBINDEX, TROPONINI in the last 168 hours. ?No results for input(s): PROBNP in the last 8760 hours. ?Coagulation Profile: ?No results for input(s): INR, PROTIME

## 2021-10-14 NOTE — Assessment & Plan Note (Signed)
Lifelong smoker.  Smokes about a pack a day ?-Encourage tobacco cessation. ?-Continue nicotine patch ?

## 2021-10-15 ENCOUNTER — Inpatient Hospital Stay (HOSPITAL_COMMUNITY): Payer: Medicare Other

## 2021-10-15 DIAGNOSIS — R55 Syncope and collapse: Secondary | ICD-10-CM

## 2021-10-15 LAB — CBC
HCT: 27.1 % — ABNORMAL LOW (ref 36.0–46.0)
Hemoglobin: 8.3 g/dL — ABNORMAL LOW (ref 12.0–15.0)
MCH: 26.9 pg (ref 26.0–34.0)
MCHC: 30.6 g/dL (ref 30.0–36.0)
MCV: 87.7 fL (ref 80.0–100.0)
Platelets: 304 10*3/uL (ref 150–400)
RBC: 3.09 MIL/uL — ABNORMAL LOW (ref 3.87–5.11)
RDW: 14.5 % (ref 11.5–15.5)
WBC: 9.3 10*3/uL (ref 4.0–10.5)
nRBC: 0 % (ref 0.0–0.2)

## 2021-10-15 LAB — RENAL FUNCTION PANEL
Albumin: 3.1 g/dL — ABNORMAL LOW (ref 3.5–5.0)
Anion gap: 6 (ref 5–15)
BUN: 8 mg/dL (ref 8–23)
CO2: 29 mmol/L (ref 22–32)
Calcium: 8.5 mg/dL — ABNORMAL LOW (ref 8.9–10.3)
Chloride: 100 mmol/L (ref 98–111)
Creatinine, Ser: 0.72 mg/dL (ref 0.44–1.00)
GFR, Estimated: >60 mL/min (ref >60)
Glucose, Bld: 81 mg/dL (ref 70–99)
Phosphorus: 3 mg/dL (ref 2.5–4.6)
Potassium: 4.1 mmol/L (ref 3.5–5.1)
Sodium: 135 mmol/L (ref 135–145)

## 2021-10-15 LAB — URINE CULTURE: Culture: NO GROWTH

## 2021-10-15 LAB — HEMOGLOBIN AND HEMATOCRIT, BLOOD
HCT: 29.4 % — ABNORMAL LOW (ref 36.0–46.0)
HCT: 28.1 % — ABNORMAL LOW (ref 36.0–46.0)
Hemoglobin: 9 g/dL — ABNORMAL LOW (ref 12.0–15.0)
Hemoglobin: 8.8 g/dL — ABNORMAL LOW (ref 12.0–15.0)

## 2021-10-15 LAB — MAGNESIUM: Magnesium: 1.8 mg/dL (ref 1.7–2.4)

## 2021-10-15 MED ORDER — ASCORBIC ACID 500 MG PO TABS
500.0000 mg | ORAL_TABLET | Freq: Every day | ORAL | Status: DC
  Administered 2021-10-16 – 2021-10-18 (×3): 500 mg via ORAL
  Filled 2021-10-15 (×3): qty 1

## 2021-10-15 MED ORDER — BOOST / RESOURCE BREEZE PO LIQD CUSTOM
1.0000 | Freq: Three times a day (TID) | ORAL | Status: DC
  Administered 2021-10-15 – 2021-10-18 (×7): 1 via ORAL

## 2021-10-15 MED ORDER — ADULT MULTIVITAMIN W/MINERALS CH
1.0000 | ORAL_TABLET | Freq: Every day | ORAL | Status: DC
  Administered 2021-10-15 – 2021-10-18 (×4): 1 via ORAL
  Filled 2021-10-15 (×4): qty 1

## 2021-10-15 MED ORDER — PEG 3350-KCL-NA BICARB-NACL 420 G PO SOLR: 4000.0000 mL | Freq: Once | ORAL | Status: DC

## 2021-10-15 MED ORDER — OYSTER SHELL CALCIUM/D3 500-5 MG-MCG PO TABS
1.0000 | ORAL_TABLET | Freq: Every day | ORAL | Status: DC
  Administered 2021-10-16 – 2021-10-18 (×2): 1 via ORAL
  Filled 2021-10-15 (×2): qty 1

## 2021-10-15 MED ORDER — PEG 3350-KCL-NA BICARB-NACL 420 G PO SOLR
4000.0000 mL | Freq: Once | ORAL | Status: AC
  Administered 2021-10-16: 4000 mL via ORAL

## 2021-10-15 NOTE — H&P (View-Only) (Signed)
[facility] Progress Note ? ?Amy Mckay 87 y.o. [birth year] ? ?CC: Anemia, dysphagia ? ? ?Subjective: ?Patient seen and examined laying in bed.  Notes she continues to have dysphagia.  Has been on clear liquid diet.  Denies abdominal pain. ? ?ROS : Review of Systems  ?Constitutional:  Negative for chills and fever.  ?Gastrointestinal:  Negative for abdominal pain, blood in stool, constipation, diarrhea, heartburn, melena, nausea and vomiting.  ?Skin:  Negative for itching and rash.   ? ? ?Objective: ?Vital signs in last 24 hours: ?Vitals:  ? 10/15/21 0904 10/15/21 1242  ?BP:  126/85  ?Pulse:  91  ?Resp:  16  ?Temp:  98.8 ?F (37.1 ?C)  ?SpO2: 92% 98%  ? ? ?Physical Exam: ? ?General:  Alert, cooperative, no distress, appears stated age  ?Head:  Normocephalic, without obvious abnormality, atraumatic  ?Eyes:  Anicteric sclera, EOM's intact  ?Lungs:   Clear to auscultation bilaterally, respirations unlabored  ?Heart:  Regular rate and rhythm, S1, S2 normal  ?Abdomen:   Soft, non-tender, bowel sounds active all four quadrants,  no masses,   ?Extremities: Extremities normal, atraumatic, no  edema  ?Pulses: 2+ and symmetric  ? ? ?Lab Results: ?Recent Labs  ?  10/14/21 ?0503 10/15/21 ?0025  ?NA 135 135  ?K 4.1 4.1  ?CL 100 100  ?CO2 29 29  ?GLUCOSE 88 81  ?BUN 12 8  ?CREATININE 0.72 0.72  ?CALCIUM 8.0* 8.5*  ?MG  --  1.8  ?PHOS  --  3.0  ? ?Recent Labs  ?  10/13/21 ?1641 10/15/21 ?0025  ?AST 20  --   ?ALT 14  --   ?ALKPHOS 72  --   ?BILITOT 0.2*  --   ?PROT 7.8  --   ?ALBUMIN 4.0 3.1*  ? ?Recent Labs  ?  10/13/21 ?1641 10/14/21 ?0503 10/14/21 ?0825 10/15/21 ?0025 10/15/21 ?0354  ?WBC 9.7 8.0  --  9.3  --   ?NEUTROABS 7.5  --   --   --   --   ?HGB 9.3* 8.0*   < > 8.3* 9.0*  ?HCT 30.6* 27.2*   < > 27.1* 29.4*  ?MCV 90.0 91.6  --  87.7  --   ?PLT 340 284  --  304  --   ? < > = values in this interval not displayed.  ? ?No results for input(s): LABPROT, INR in the last 72  hours. ? ? ? ?Assessment ?Fatigue ?Dysphagia ? ?Dysphagia likely due to esophageal dysmotility.  No stricture seen on barium swallow.  Patient will need to continue chewing very carefully taking small bites of drinking water with meals.  If unable to take pills may need to transition to liquid medication.  No indication for EGD at this time. ? ?Barium swallow 10/14/2021 ?Somewhat limited examination due to the patient's inability to stand and limited ability to reposition on the fluoroscopy table. A barium tablet was not administered as the patient reports she cannot ?swallow tablets unless chewed. ?Prominent intermittent esophageal dysmotility with tertiary ?contractions and incomplete esophageal stripping. ?  ?No appreciable esophageal stricture. ? ? ?Plan: ?Continue supportive care ?Advance diet as tolerated ?Encourage patient to continue chewing food carefully, taking small bites.  ?[facility] will sign off ? ?Emmit Alexanders PA-C ?10/15/2021, 2:17 PM ? ?Contact #  [phone]  ?

## 2021-10-15 NOTE — TOC Progression Note (Signed)
Transition of Care (TOC) - Progression Note  ? ? ?Patient Details  ?Name: Amy Mckay ?MRN: [id number] ?Date of Birth: 04/19/1935 ? ?Transition of Care (TOC) CM/SW Contact  ?Dessa Phi, RN ?Phone Number: ?10/15/2021, 10:41 AM ? ?Clinical Narrative:  [facility] rep Cheryl following for HHPT.  ? ? ? ?Expected Discharge Plan: [place] ?Barriers to Discharge: Continued Medical Work up ? ?Expected Discharge Plan and Services ?Expected Discharge Plan: [place] ?  ?Discharge Planning Services: CM Consult ?Post Acute Care Choice: Home Health ?Living arrangements for the past 2 months: [place] ?                ?  ?  ?  ?  ?  ?HH Arranged: PT ?[place] Agency: [place] ?Date HH Agency Contacted: 10/15/21 ?Time [place]: [id number] spoke with at [place]: Malachy Mood ? ? ?Social Determinants of Health (SDOH) Interventions ?  ? ?Readmission Risk Interventions ?   ? View : No data to display.  ?  ?  ?  ? ? ?

## 2021-10-15 NOTE — Progress Notes (Signed)
Carotid duplex has been completed. ? ?Results can be found under chart review under CV PROC. ?10/15/2021 11:41 AM ?Berel Najjar RVT, RDMS ? ?

## 2021-10-15 NOTE — TOC Progression Note (Addendum)
Transition of Care (TOC) - Progression Note  ? ? ?Patient Details  ?Name: Tyah Acord ?MRN: [id number] ?Date of Birth: 07-31-1934 ? ?Transition of Care (TOC) CM/SW Contact  ?Lanier Clam, RN ?Phone Number: ?10/15/2021, 1:03 PM ? ?Clinical Narrative: Spoke to grand dtr Meggan about services @ home & d/c plans-already has home hospice services-unsure of agency-she will check & get back to me;also home 02-unsure of current dme company-some issues with tank size wants portables;has rw,cane,private duty aide. Will confirm services & dme. ? ?-2:54p-confirmed [facility] home hospice service Active;[facility]-home 02;[facility] for aide through [facility] program. Will need PTAR @ d/c-confirmed address on face sheet. Per dtr [name] agree with all above for return home. ? ? ? ?Expected Discharge Plan: Home w Hospice Care ?Barriers to Discharge: Continued Medical Work up ? ?Expected Discharge Plan and Services ?Expected Discharge Plan: Home w Hospice Care ?  ?Discharge Planning Services: CM Consult ?Post Acute Care Choice: Hospice (Home hospice;rw,cane,02) ?Living arrangements for the past 2 months: Single Family Home ?                ?  ?  ?  ?  ?  ?HH Arranged: PT ?HH Agency: [facility] ?Date HH Agency Contacted: 10/15/21 ?Time HH Agency Contacted: 1041 ?Representative spoke with at [facility] Agency: Elnita Maxwell ? ? ?Social Determinants of Health (SDOH) Interventions ?  ? ?Readmission Risk Interventions ?   ? View : No data to display.  ?  ?  ?  ? ? ?

## 2021-10-15 NOTE — Progress Notes (Signed)
Initial Nutrition Assessment ? ?DOCUMENTATION CODES:  ? ?Not applicable ? ?INTERVENTION:  ?- will order Boost Breeze TID, each supplement provides 250 kcal and 9 grams of protein. ?- will order 1 tablet multivitamin with minerals/day. ?- complete NFPE when feasible. ?- diet advancement as medically feasible.  ? ? ?NUTRITION DIAGNOSIS:  ? ?Inadequate oral intake related to acute illness, other (see comment) (current diet order) as evidenced by other (comment) (CLD does not meet estimated needs.). ? ?GOAL:  ? ?Patient will meet greater than or equal to 90% of their needs ? ?MONITOR:  ? ?PO intake, Supplement acceptance, Diet advancement, Labs, Weight trends ? ?REASON FOR ASSESSMENT:  ? ?Malnutrition Screening Tool ? ?ASSESSMENT:  ? ?87 year old female with medical history of COPD, chronic respiratory on 3-4 L at baseline, PAD/SMA stent, chronic hyponatremia, RLS, HTN, hypothyroidism, GERD, chronic pain syndrome, and cognitive impairment. She presented to the ED due to dizziness, shortness of breath, and possible melena. In the ED she was found to have hypotension. In the ED she was hemoccult positive. CTA chest and abdomen/pelvis showed multiple pulmonary nodules, chronic occlusion of R vertebral artery and severe atherosclerotic disease. She was admitted for near syncope and possible blood loss anemia with positive hemoccult. GI was consulted. ? ?Patient sitting up in bed. No visitors present at the time of RD visit. Lunch tray in front of patient and she had consumed all but the tea which she was saving; she requested a cup of black coffee which RD provided her at the end of visit.  ? ?Patient shares that she lives at home with her daughter who has many health concerns and diagnoses, including lupus.  ? ?Much of the time with patient was spent providing active, empathetic listening about topics related to her health, her daughter's health, and the support/help they receive in the home.  ? ?Patient shares that she  typically goes to bed between midnight and 2 AM and sleeps until 10 AM. She frequently eats only one meal/day which is usually late in the evening. A favorite meal for her is vegetable and beef stew.  ? ?Weight today is 121 lb and weight has been stable for the past 7 months. No information documented in the edema section of flow sheet. ? ?Per notes: ?- near syncope ?- esophageal dysphagia--esophagram concerning for esophageal dysmotility ?- iron deficiency anemia d/t chronic blood loss ?- acute toxic encephalopathy--CT head without acute findings ?- chronic lower back pain d/t advanced DDD ?- multiple pulmonary nodules/lesions with recommendation for 3-6 month follow-up ? ? ?Labs reviewed; Ca: 8.5 mg/dl. ? ?Medications reviewed; 75 mcg oral synthroid/day, 40 mg IV protonix BID, 250 mg ferric gluconate x2 doses.  ? ?IVF; NS @ 100 ml/hr.  ?  ? ?NUTRITION - FOCUSED PHYSICAL EXAM: ? ?Defer due to time constraints and providing active, empathetic listening.  ? ?Diet Order:   ?Diet Order   ? ?       ?  Diet clear liquid Room service appropriate? Yes; Fluid consistency: Thin  Diet effective now       ?  ? ?  ?  ? ?  ? ? ?EDUCATION NEEDS:  ? ?Not appropriate for education at this time ? ?Skin:  Skin Assessment: Reviewed RN Assessment ? ?Last BM:  PTA/unknown ? ?Height:  ? ?Ht Readings from Last 1 Encounters:  ?10/13/21 5\' 1"  (1.549 m)  ? ? ?Weight:  ? ?Wt Readings from Last 1 Encounters:  ?10/15/21 55.1 kg  ? ? ? ?BMI:  Body  mass index is 22.94 kg/m?. ? ?Estimated Nutritional Needs:  ?Kcal:  1500-1700 kcal ?Protein:  75-90 grams ?Fluid:  >/= 1.8 L/day ? ? ? ? ?Trenton Gammon, MS, RD, LDN ?Registered Dietitian II ?Inpatient Clinical Nutrition ?RD pager # and on-call/weekend pager # available in [facility]ION  ? ?

## 2021-10-15 NOTE — Progress Notes (Signed)
?PROGRESS NOTE ?Amy Mckay  KNL:[id number] DOB: January 11, 1935 DOA: 10/13/2021 ?PCP: Annita Brod, MD  ? ?Brief Narrative/Hospital Course: ?[name] with PMH of COPD/chronic RF on 3 to 4 L, PAD/SMA stent, chronic hyponatremia, RLS, HTN, hypothyroidism, GERD, chronic pain syndrome and cognitive impairment presenting with dizziness, shortness of breath and possible melena.  She was hypotensive to 84/63 on arrival.  Hgb 9.3 (12.8 in 01/2021).  Hemoccult positive.  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  Patient was admitted for near syncope, possible blood loss anemia with positive Hemoccult.  GI consulted-chronic dysphagia likely due to dysmotility barium swallow-limited views no stricture esophageal dysmotility noted being managed conservatively per GI ? ?Overnight no fever, hemoglobin stable and up to 9.0  ?  ?Subjective: ?Seen examined this morning.  Reports he feels much improved, reports she did not know what was going on until yesterday was forgetful. ?This morning hemoglobin improved 9.1 g stable BMP ?On Nasal cannula 3l-4l home setting,orthostatic vitals were negative ?On clear liquid diet ? ?Assessment and Plan: ?Principal Problem: ?  Near syncope ?Active Problems: ?  Iron deficiency anemia due to chronic blood loss ?  Esophageal dysphagia ?  Acute toxic encephalopathy ?  Chronic lower back pain ?  COPD (chronic obstructive pulmonary disease) (HCC) ?  Chronic respiratory failure with hypoxia (HCC) ?  Peripheral arterial disease (HCC) ?  Benign essential HTN ?  Hypothyroidism ?  Memory impairment ?  Restless leg ?  Chronic hyponatremia ?  Tobacco use disorder ?  Pulmonary nodules/lesions, multiple ?  Orthostatic hypotension ? ?Near syncope ?Orthostatic hypotension: ?Multifactorial presentation due to orthostatic  hypotension, anemia, chronically occluded right vertebral artery.  Managed with IV fluids, orthostatic hypotension resolved.  Hemoglobin remained stable.  Holding antihypertensive, polypharmacy on oxycodone,gabapentin and Flexeril- discussed risks.  Duplex reviewed  see below. PT OT to continue ? ?Bilateral carotid artery disease: duplex carotids bilaterally shows 40-59% stenosis ICA, nonhemodynamically significant plaque <50% stenosed, the ECA appears more than 50% stenosed.  Left vertebral artery shows presteal syndrome, left subclavian artery stenotic,, rt s/c artery post stenotic, suggestive of more proximal disease-followed by Dr. Randie Heinz who was planning for outpatient carotid duplex-discussed with Dr. Myra Gianotti from from vascular highly unlikely that it is causing any symptoms and he advised outpatient follow-up ? ?Chronic dysphagia likely due to mild dysmotility:Reportedly had an episode of regurgitation.Esophagram somewhat limited examination but concerning for esophageal dysmotility.  Clear liquid diet, GI following ? ?Iron deficiency anemia due to chronic blood loss ?Hemoccult positive stool: ?Hemoglobin is stable improved to 9.0, S/P IV ferric gluconate to 50 mg x 2, Plavix and aspirin on hold, continue PPI twice daily, GI following holding off on endoscopy evaluation for now. ?Recent Labs  ?Lab 10/14/21 ?0503 10/14/21 ?0825 10/14/21 ?1531 10/15/21 ?0025 10/15/21 ?7902  ?HGB 8.0* 8.6* 8.1* 8.3* 9.0*  ?HCT 27.2* 28.9* 26.3* 27.1* 29.4*  ?  ?Acute toxic encephalopathy ?Polypharmacy: ?Per daughter delusional when EMS arrived, patient reports she has been confused forgetful yesterday today she is much more alert awake oriented.  CT head no acute neurological deficit at this time mentation is stable and at baseline continue fall precaution continue PT OT  ? 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 ? ?COPD ?Chronic respiratory failure with hypoxia on 4 L nasal cannula: ?Reportedly had wheezing on presentation.  At this time shortness of breath is stable at baseline on 4 L nasal cannula, continue Incruse/Dulera, DuoNeb, supplemental oxygen, smoking cessation was discussed previously ? ?Peripheral arterial disease:S/p remote SMA stenting.   ?Aspirin Plavix on hold due to concern for anemia Hemoccult positive stool hopefully can resume once okay with GI, continue statin.   ? ?Pulmonary nodules/lesions, multiple:Repeat CT without contrast in 3 to 6 months recommended.  Patient is considered high risk given lifelong smoking. ? ?Tobacco use disorder:Cessation advised.   ? ?Chronic hyponatremiaSodium stable.   ? ?Restless leg-Continue home meds. ? ?Hypothyroidism ?Continue Synthroid. ? ?Hypertension not taking medication losartan.  Blood pressure stable  ? ?DVT prophylaxis: SCDs Start: 10/13/21 2016 ?Code Status:   Code Status: DNR ?Family Communication: plan of care discussed with patientat bedside. ?Patient status is: Inpatient because of ongoing GI evaluation and diet tolerance ?Level of care: Telemetry  ? ?Dispo: The patient is from: home.  Reports she is followed by palliative care hospice ?           Anticipated disposition: Home with hospice/palliative care ? ?Mobility Assessment (last 72 hours)   ? ? Mobility Assessment   ? ? Row Name 10/15/21 0848 10/14/21 2112 10/14/21 1542 10/14/21 1504 10/14/21 0751  ? Does patient have an order for bedrest or is patient medically unstable No - Continue assessment No - Continue assessment -- -- No - Continue assessment  ? What is the highest level of mobility based on the progressive  mobility assessment? Level 3 (Stands with assist) - Balance while standing  and cannot march in place Level 5 (Walks with assist in room/hall) - Balance while stepping forward/back and can walk in room with assist - Complete Level 5 (Walks with assist in room/hall) - Balance while stepping forward/back and can walk in room with assist - Complete Level 5 (Walks with assist in room/hall) - Balance while stepping forward/back and can walk in room with assist - Complete Level 3 (Stands with assist) - Balance while standing  and cannot march in place  ? Is the above level different from baseline mobility prior to current illness? Yes - Recommend PT order Yes - Recommend PT order -- -- Yes - Recommend PT order  ? ? Row Name 10/13/21 2319  ?  ?  ?  ?  ? Does patient have an order for bedrest or is patient medically unstable No - Continue assessment      ? What is the highest level of mobility based on the progressive mobility assessment? Level 3 (Stands with assist) - Balance while standing  and cannot march in place      ? ?  ?  ? ?  ?  ? ?Objective: ?Vitals last 24 hrs: ?Vitals:  ? 10/15/21 0425 10/15/21 0500 10/15/21 0901 10/15/21 0904  ?BP: 107/74     ?Pulse: 83     ?Resp: 18     ?Temp: 98.9 ?F (37.2 ?C)     ?TempSrc: Oral     ?SpO2: 94%  92% 92%  ?Weight:  55.1 kg    ?Height:      ? ?Weight change: -2.9 kg ? ?Physical Examination: ?General exam: alert awake oriented, pleasant, conversant,older than stated age, weak  appearing. ?HEENT:Oral mucosa moist, Ear/Nose WNL grossly, dentition normal. ?Respiratory system: bilaterally diminished BS, no use of accessory muscle ?Cardiovascular system: S1 & S2 +, No JVD. ?Gastrointestinal system: Abdomen soft,NT,ND, BS+ ?Nervous System:Alert, awake, moving extremities and grossly nonfocal ?Extremities: LE edema none,distal peripheral pulses palpable.  ?Skin: No rashes,no icterus. ?MSK: Normal muscle bulk,tone, power ? ?Medications reviewed:  ?Scheduled Meds: ? atorvastatin  10 mg  Oral Daily  ? gabapentin  800 mg Oral TID  ? levothyroxine  75 mcg Oral QAC breakfast  ? memantine  10 mg Oral BID  ? mometasone-formoterol  2 puff Inhalation BID  ? nicotine  21 mg Transdermal Daily  ? pantoprazo

## 2021-10-15 NOTE — Progress Notes (Signed)
[facility] Progress Note ? ?Amy Mckay 87 y.o. 09/20/1934 ? ?CC: Anemia, dysphagia ? ? ?Subjective: ?Patient seen and examined laying in bed.  Notes she continues to have dysphagia.  Has been on clear liquid diet.  Denies abdominal pain. ? ?ROS : Review of Systems  ?Constitutional:  Negative for chills and fever.  ?Gastrointestinal:  Negative for abdominal pain, blood in stool, constipation, diarrhea, heartburn, melena, nausea and vomiting.  ?Skin:  Negative for itching and rash.   ? ? ?Objective: ?Vital signs in last 24 hours: ?Vitals:  ? 10/15/21 0904 10/15/21 1242  ?BP:  126/85  ?Pulse:  91  ?Resp:  16  ?Temp:  98.8 ?F (37.1 ?C)  ?SpO2: 92% 98%  ? ? ?Physical Exam: ? ?General:  Alert, cooperative, no distress, appears stated age  ?Head:  Normocephalic, without obvious abnormality, atraumatic  ?Eyes:  Anicteric sclera, EOM's intact  ?Lungs:   Clear to auscultation bilaterally, respirations unlabored  ?Heart:  Regular rate and rhythm, S1, S2 normal  ?Abdomen:   Soft, non-tender, bowel sounds active all four quadrants,  no masses,   ?Extremities: Extremities normal, atraumatic, no  edema  ?Pulses: 2+ and symmetric  ? ? ?Lab Results: ?Recent Labs  ?  10/14/21 ?0503 10/15/21 ?0025  ?NA 135 135  ?K 4.1 4.1  ?CL 100 100  ?CO2 29 29  ?GLUCOSE 88 81  ?BUN 12 8  ?CREATININE 0.72 0.72  ?CALCIUM 8.0* 8.5*  ?MG  --  1.8  ?PHOS  --  3.0  ? ?Recent Labs  ?  10/13/21 ?1641 10/15/21 ?0025  ?AST 20  --   ?ALT 14  --   ?ALKPHOS 72  --   ?BILITOT 0.2*  --   ?PROT 7.8  --   ?ALBUMIN 4.0 3.1*  ? ?Recent Labs  ?  10/13/21 ?1641 10/14/21 ?0503 10/14/21 ?0825 10/15/21 ?0025 10/15/21 ?0752  ?WBC 9.7 8.0  --  9.3  --   ?NEUTROABS 7.5  --   --   --   --   ?HGB 9.3* 8.0*   < > 8.3* 9.0*  ?HCT 30.6* 27.2*   < > 27.1* 29.4*  ?MCV 90.0 91.6  --  87.7  --   ?PLT 340 284  --  304  --   ? < > = values in this interval not displayed.  ? ?No results for input(s): LABPROT, INR in the last 72  hours. ? ? ? ?Assessment ?Fatigue ?Dysphagia ? ?Dysphagia likely due to esophageal dysmotility.  No stricture seen on barium swallow.  Patient will need to continue chewing very carefully taking small bites of drinking water with meals.  If unable to take pills may need to transition to liquid medication.  No indication for EGD at this time. ? ?Barium swallow 10/14/2021 ?Somewhat limited examination due to the patient's inability to stand and limited ability to reposition on the fluoroscopy table. A barium tablet was not administered as the patient reports she cannot ?swallow tablets unless chewed. ?Prominent intermittent esophageal dysmotility with tertiary ?contractions and incomplete esophageal stripping. ?  ?No appreciable esophageal stricture. ? ? ?Plan: ?Continue supportive care ?Advance diet as tolerated ?Encourage patient to continue chewing food carefully, taking small bites.  ?[facility] will sign off ? ?Duriel Deery N Karime Scheuermann PA-C ?10/15/2021, 2:17 PM ? ?Contact #  [phone]  ?

## 2021-10-16 DIAGNOSIS — R55 Syncope and collapse: Secondary | ICD-10-CM | POA: Diagnosis not present

## 2021-10-16 LAB — HEMOGLOBIN AND HEMATOCRIT, BLOOD
HCT: 27.4 % — ABNORMAL LOW (ref 36.0–46.0)
HCT: 30.5 % — ABNORMAL LOW (ref 36.0–46.0)
Hemoglobin: 8.7 g/dL — ABNORMAL LOW (ref 12.0–15.0)
Hemoglobin: 9.5 g/dL — ABNORMAL LOW (ref 12.0–15.0)

## 2021-10-16 NOTE — Progress Notes (Signed)
Physical Therapy Treatment ?Patient Details ?Name: Amy Mckay ?MRN: [id number] ?DOB: 07/29/1934 ?Today's Date: 10/16/2021 ? ? ?History of Present Illness Ilena Gieske is a 87 y.o. female with medical history significant for COPD, chronic respiratory failure with hypoxia on 4 L supplemental O2 via [place], PAD s/p SMA stenting, chronic hyponatremia, HTN, hypothyroidism, GERD, RLS, chronic pain syndrome, memory impairment who presented to the ED 10/13/21  for evaluation of dizziness ? ?  ?PT Comments  ? ? General Comments: AxO x 3 very pleasant Lady who cares for her disabled daughter.  Assisted OOB to amb in hallway went well.  General transfer comment: cues for safety, use of RW for support, self distracting with talking.  General Gait Details: tolerated an increased functional distance of 50 feet with walker on 4 lts oxygen with no c/o dizziness, just "weary" fatigue.  Pt reports "little eating" and "not much getting up".   Pt is active at home caring for her disabled daughter. ?Pt plans to return home under Hospice Services.    ?Recommendations for follow up therapy are one component of a multi-disciplinary discharge planning process, led by the attending physician.  Recommendations may be updated based on patient status, additional functional criteria and insurance authorization. ? ?Follow Up Recommendations ? Home health PT ?  ?  ?Assistance Recommended at Discharge Frequent or constant Supervision/Assistance  ?Patient can return home with the following A little help with walking and/or transfers;A little help with bathing/dressing/bathroom;Help with stairs or ramp for entrance;Assist for transportation;Assistance with cooking/housework ?  ?Equipment Recommendations ?    ?  ?Recommendations for Other Services   ? ? ?  ?Precautions / Restrictions Precautions ?Precautions: Fall ?Precaution Comments: Home oxygen 4 lts ?Restrictions ?Weight Bearing Restrictions: No  ?  ? ?Mobility ? Bed Mobility ?Overal bed mobility:  Modified Independent ?  ?  ?  ?  ?  ?  ?General bed mobility comments: self able with increased time ?  ? ?Transfers ?Overall transfer level: Needs assistance ?Equipment used: Rolling walker (2 wheels) ?Transfers: Sit to/from Stand ?Sit to Stand: Min guard, Min assist ?  ?Step pivot transfers: Min guard, Min assist ?  ?  ?  ?General transfer comment: cues for safety, use of RW for support, self distracting with talking ?  ? ?Ambulation/Gait ?Ambulation/Gait assistance: Min assist ?Gait Distance (Feet): 55 Feet ?Assistive device: Rolling walker (2 wheels) ?Gait Pattern/deviations: Step-through pattern ?Gait velocity: decreased ?  ?  ?General Gait Details: tolerated an increased functional distance of 50 feet with walker on 4 lts oxygen with no c/o dizziness, just "weary" fatigue.  Pt reports "little eating" and "not much getting up".   Pt is active at home caring for her disabled daughter. ? ? ?Stairs ?  ?  ?  ?  ?  ? ? ?Wheelchair Mobility ?  ? ?Modified Rankin (Stroke Patients Only) ?  ? ? ?  ?Balance   ?  ?  ?  ?  ?  ?  ?  ?  ?  ?  ?  ?  ?  ?  ?  ?  ?  ?  ?  ? ?  ?Cognition   ?  ?Overall Cognitive Status: Within Functional Limits for tasks assessed ?  ?  ?  ?  ?  ?  ?  ?  ?  ?  ?  ?  ?  ?  ?  ?  ?General Comments: AxO x 3 very pleasant Lady who cares for her disabled daughter ?  ?  ? ?  ?  Exercises   ? ?  ?General Comments   ?  ?  ? ?Pertinent Vitals/Pain Pain Assessment ?Pain Assessment: No/denies pain ?Pain Descriptors / Indicators: Aching  ? ? ?Home Living   ?  ?  ?  ?  ?  ?  ?  ?  ?  ?   ?  ?Prior Function    ?  ?  ?   ? ?PT Goals (current goals can now be found in the care plan section) Progress towards PT goals: Progressing toward goals ? ?  ?Frequency ? ? ?   ? ? ? ?  ?PT Plan Current plan remains appropriate  ? ? ?Co-evaluation   ?  ?  ?  ?  ? ?  ?AM-PAC PT "6 Clicks" Mobility   ?Outcome Measure ? Help needed turning from your back to your side while in a flat bed without using bedrails?: None ?Help needed  moving from lying on your back to sitting on the side of a flat bed without using bedrails?: None ?Help needed moving to and from a bed to a chair (including a wheelchair)?: A Little ?Help needed standing up from a chair using your arms (e.g., wheelchair or bedside chair)?: A Little ?Help needed to walk in hospital room?: A Little ?Help needed climbing 3-5 steps with a railing? : A Little ?6 Click Score: 20 ? ?  ?End of Session   ?Activity Tolerance: Patient tolerated treatment well ?Patient left: in bed;with call bell/phone within reach;with bed alarm set ?Nurse Communication: Mobility status ?PT Visit Diagnosis: Unsteadiness on feet (R26.81);Dizziness and giddiness (R42) ?  ? ? ?Time: 6384-5364 ?PT Time Calculation (min) (ACUTE ONLY): 14 min ? ?Charges:  $Gait Training: 8-22 mins          ?          ? ?Felecia Shelling  PTA ?Acute  Rehabilitation Services ?Pager      [phone] ?Office      [phone] ? ? ?

## 2021-10-16 NOTE — Progress Notes (Signed)
Received a call from Pt's daughter, [name], wanting to verify that Pt will have upper endoscopy and colonoscopy tomorrow. Pt's daughter stated that she was informed by MD that EGD and colonoscopy will be done 5/5. RN informed daughter that consent obtained from Pt is only for upper endoscopy by Dr. [name] and I don't see her scheduled for colonoscopy. [facility]'s office called and made aware of daughter's request. Endorsed accordingly to Selena Batten, RN. ?

## 2021-10-16 NOTE — Progress Notes (Signed)
?PROGRESS NOTE ?Amy BeltonDelores Mckay  ZOX:[id number] DOB: August 01, 1934 DOA: 10/13/2021 ?PCP: Annita BrodAsenso, Philip, MD  ? ?Brief Narrative/Hospital Course: ?[name] with PMH of COPD/chronic RF on 3 to 4 L, PAD/SMA stent, chronic hyponatremia, RLS, HTN, hypothyroidism, GERD, chronic pain syndrome and cognitive impairment presenting with dizziness, shortness of breath and possible melena.  She was hypotensive to 84/63 on arrival.  Hgb 9.3 (12.8 in 01/2021).  Hemoccult positive.  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  Patient was admitted for near syncope, possible blood loss anemia with positive Hemoccult.  GI consulted-chronic dysphagia likely due to dysmotility barium swallow-limited views no stricture esophageal dysmotility noted being managed conservatively per GI.  On GI follow-up and after discussion with patient's daughter planning to pursue EGD colonoscopy 5/5.  Hemoglobin remains stable.  ?  ?Subjective: ?Seen and examined this morning.  Complains of some headache after using bedside commode and chair this morning.  No new complaints.  Denies nausea vomiting abdominal pain.   ?On Nasal cannula 3l-4l home setting ? ?Assessment and Plan: ?Principal Problem: ?  Near syncope ?Active Problems: ?  Iron deficiency anemia due to chronic blood loss ?  Esophageal dysphagia ?  Acute toxic encephalopathy ?  Chronic lower back pain ?  COPD (chronic obstructive pulmonary disease) (HCC) ?  Chronic respiratory failure with hypoxia (HCC) ?  Peripheral arterial disease (HCC) ?  Benign essential HTN ?  Hypothyroidism ?  Memory impairment ?  Restless leg ?  Chronic hyponatremia ?  Tobacco use disorder ?  Pulmonary nodules/lesions, multiple ?  Orthostatic hypotension ? ?Near syncope ?Orthostatic hypotension: ?Multifactorial presentation due to orthostatic  hypotension, anemia, chronically occluded right vertebral artery.  Managed with IV fluids, orthostatic hypotension resolved.  Hemoglobin remained stable.  Holding antihypertensive, polypharmacy on oxycodone,gabapentin and Flexeril- discussed risks.  Duplex reviewed  see below. PT OT to continue, stop IV fluids.  Monitor vitals. ? ?Bilateral carotid artery disease: duplex carotids bilaterally shows 40-59% stenosis ICA, nonhemodynamically significant plaque <50% stenosed, the ECA appears more than 50% stenosed.  Left vertebral artery shows presteal syndrome, left subclavian artery stenotic,, rt s/c artery post stenotic, suggestive of more proximal disease-followed by Dr. Randie Heinzain who was planning for outpatient carotid duplex-discussed with Dr. Myra GianottiBrabham from from vascular highly unlikely that it is causing any symptoms and he advised outpatient follow-up ? ?Chronic dysphagia likely due to mild dysmotility:Reportedly had an episode of regurgitation.Esophagram somewhat limited examination but concerning for esophageal dysmotility.  Continue clear liquid diet EGD as per GI.  ? ?Iron deficiency anemia due to chronic blood loss ?Hemoccult positive stool: ?GI following and noted plan for EGD colonoscopy in a.m.S/P IV ferric gluconate 250 mg x 2, and home Plavix and aspirin on hold. Cont PPI BID.  Monitor hemoglobin. ?Recent Labs  ?Lab 10/15/21 ?0025 10/15/21 ?[id number] 10/15/21 ?1542 10/16/21 ?0008 10/16/21 ?0746  ?HGB 8.3* 9.0* 8.8* 8.7* 9.5*  ?HCT 27.1* 29.4* 28.1* 27.4* 30.5*  ?  ?Acute toxic encephalopathy ?Polypharmacy: ?Per daughter delusional when EMS arrived, patient reports she has been confused forgetful yesterday today she is much more alert awake oriented.  CT head no acute neurological deficit at this time mentation is stable and at baseline continue fall precaution continue PT OT  ? 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 ? ?COPD ?Chronic respiratory failure with hypoxia on 4 L nasal cannula: ?Reportedly had wheezing on presentation.  At this time shortness of breath is stable at baseline on 4 L nasal cannula, continue Incruse/Dulera, DuoNeb, supplemental oxygen, smoking cessation was discussed previously. ? ?Peripheral arterial disease:S/p remote SMA stenting. Aspirin Plavix on hold due to concern for anemia Hemoccult positive stool hopefully resume once okay with GI, continue statin.   ? ?Pulmonary nodules/lesions, multiple:Repeat CT without contrast in 3 to 6 months recommended.  Patient is considered high risk given lifelong smoking. ? ?Tobacco use disorder:Cessation advised.   ? ?Chronic hyponatremiaSodium stable.   ? ?Restless leg-Continue home meds. ? ?Hypothyroidism ?Continue Synthroid. ? ?Hypertension not taking medication losartan.  Blood pressure stable  ? ?DVT prophylaxis: SCDs Start: 10/13/21 2016 ?Code Status:   Code Status: DNR ?Family Communication: plan of care discussed with patientat bedside. ?Patient status is: Inpatient because of ongoing GI evaluation and diet tolerance ?Level of care: Telemetry  ? ?Dispo: The patient is from: home.  Reports she is followed by palliative care hospice ?           Anticipated disposition: Home with hospice/palliative care once cleared by GI 24-48 hrs ? ?Mobility Assessment (last 72 hours)   ? ? Mobility Assessment   ? ? Row Name 10/16/21 1000 10/16/21 0900 10/15/21 2000 10/15/21 0848 10/14/21 2112  ? Does patient have an order for bedrest or is patient medically unstable -- No - Continue assessment No - Continue assessment No - Continue assessment No - Continue  assessment  ? What is the highest level of mobility based on the progressive mobility assessment? Level 4 (Walks with assist in room) - Balance while marching in place and cannot step forward and back - Complete Level 4 (Walks with assist in room) - Balance while marching in place and cannot step forward and back - Complete Level 3 (Stands with assist) - Balance while standing  and cannot march in place Level 3 (Stands with assist) - Balance while standing  and cannot march in place Level 5 (Walks with assist in room/hall) - Balance while stepping forward/back and can walk in room with assist - Complete  ? Is the above level different from baseline mobility prior to current illness? -- Yes - Recommend PT order Yes - Recommend PT order Yes - Recommend PT order Yes - Recommend PT order  ? ? Row Name 10/14/21 1542 10/14/21 1504 10/14/21 0751 10/13/21 2319  ?  ? Does patient have an order for bedrest or is patient medically unstable -- -- No - Continue assessment No - Continue assessment   ? What is the highest level of mobility based on the progressive mobility assessment? Level 5 (Walks with assist in room/hall) - Balance while stepping forward/back and can walk in room with assist - Complete Level 5 (Walks with assist in room/hall) - Balance while stepping forward/back and can walk in room with assist - Complete Level 3 (Stands with assist) - Balance while standing  and cannot march in place Level 3 (Stands with assist) - Balance while standing  and cannot march in place   ? Is the above level different from baseline mobility prior to current illness? -- -- Yes - Recommend PT order --   ? ?  ?  ? ?  ?  ? ?  Objective: ?Vitals last 24 hrs: ?Vitals:  ? 10/15/21 1242 10/16/21 0024 10/16/21 0500 10/16/21 0533  ?BP: 126/85 (!) 155/99  113/84  ?Pulse: 91 84  81  ?Resp: 16 16  20   ?Temp: 98.8 ?F (37.1 ?C)   98.8 ?F (37.1 ?C)  ?TempSrc: Oral   Oral  ?SpO2: 98% 97%  100%  ?Weight:   54.2 kg   ?Height:      ? ?Weight change: 4.1  kg ? ?Physical Examination: ?General exam: AA, oriented, pleasant,older than stated age, weak appearing. ?HEENT:Oral mucosa moist, Ear/Nose WNL grossly, dentition normal. ?Respiratory system: bilaterally diminishe

## 2021-10-16 NOTE — Progress Notes (Signed)
Occupational Therapy Treatment ?Patient Details ?Name: Amy Mckay ?MRN: [id number] ?DOB: Oct 31, 1934 ?Today's Date: 10/16/2021 ? ? ?History of present illness Amy Mckay is a 87 y.o. female with medical history significant for COPD, chronic respiratory failure with hypoxia on 4 L supplemental O2 via [place], PAD s/p SMA stenting, chronic hyponatremia, HTN, hypothyroidism, GERD, RLS, chronic pain syndrome, memory impairment who presented to the ED 10/13/21  for evaluation of dizziness ?  ?OT comments ? Patient reports will be starting colonoscopy prep today and looks forward to being able to eat "I haven't gone to the bathroom or eaten in about 5 days." Patient tangential at times but easily redirected to task. Patient able to complete bed mobility without assist and min G to transfer to recliner chair without AD. Patient reports feeling a little light headed but did not worsen once in chair, RN made aware. D/C plan updated as patient currently on hospice at home with home aide to assist with bathing/dressing. Will continue to follow acutely to maximize activity tolerance and global strength to D/C home with hospice support.   ? ?Recommendations for follow up therapy are one component of a multi-disciplinary discharge planning process, led by the attending physician.  Recommendations may be updated based on patient status, additional functional criteria and insurance authorization. ?   ?Follow Up Recommendations ? Other (comment) (Patient on home hospice)  ?  ?Assistance Recommended at Discharge Frequent or constant Supervision/Assistance  ?Patient can return home with the following ? A little help with walking and/or transfers;A little help with bathing/dressing/bathroom;Assistance with cooking/housework;Direct supervision/assist for financial management;Assist for transportation;Help with stairs or ramp for entrance;Direct supervision/assist for medications management ?  ?Equipment Recommendations ? None recommended by  OT  ?  ?   ?Precautions / Restrictions Precautions ?Precautions: Fall ?Precaution Comments: has been orthostatic but not 5/2 on eval ?Restrictions ?Weight Bearing Restrictions: No  ? ? ?  ? ?Mobility Bed Mobility ?Overal bed mobility: Modified Independent ?  ?  ?  ?  ?  ?  ?  ?  ? ? ?  ?Balance Overall balance assessment: Mild deficits observed, not formally tested ?  ?  ?  ?  ?  ?  ?  ?  ?  ?  ?  ?  ?  ?  ?  ?  ?  ?  ?   ? ?ADL either performed or assessed with clinical judgement  ? ?ADL Overall ADL's : Needs assistance/impaired ?  ?  ?  ?  ?  ?  ?  ?  ?  ?  ?  ?  ?Toilet Transfer: Min guard;Stand-pivot ?Toilet Transfer Details (indicate cue type and reason): Patient able to take few steps from edge of bed to recliner without use of adaptive device and min G for safety. Does endorse a little "light headed" but symptoms did not worsen post transfer. RN made aware. ?  ?  ?  ?  ?Functional mobility during ADLs: Min guard ?  ?  ? ? ? ?Cognition Arousal/Alertness: Awake/alert ?Behavior During Therapy: [facility] for tasks assessed/performed ?Overall Cognitive Status: History of cognitive impairments - at baseline ?  ?  ?  ?  ?  ?  ?  ?  ?  ?  ?  ?  ?  ?  ?  ?  ?General Comments: Patient following directions appropriately this session, tangential at times ?  ?  ?   ?   ?   ?General Comments VSS on 4L O2  ? ? ?  Pertinent Vitals/ Pain       Pain Assessment ?Pain Assessment: Faces ?Faces Pain Scale: Hurts little more ?Pain Location: back ?Pain Descriptors / Indicators: Aching ?Pain Intervention(s): Repositioned, Limited activity within patient's tolerance ? ?   ?   ? ?Frequency ? Min 2X/week  ? ? ? ? ?  ?Progress Toward Goals ? ?OT Goals(current goals can now be found in the care plan section) ? Progress towards OT goals: Progressing toward goals ? ?Acute Rehab OT Goals ?Patient Stated Goal: Be able to eat and sleep ?OT Goal Formulation: With patient ?Time For Goal Achievement: 10/28/21 ?Potential to Achieve Goals: Good ?ADL  Goals ?Pt Will Perform Grooming: standing;with supervision ?Pt Will Perform Lower Body Dressing: with supervision;sit to/from stand ?Pt Will Transfer to Toilet: with supervision;ambulating;regular height toilet ?Pt Will Perform Toileting - Clothing Manipulation and hygiene: with supervision;sit to/from stand  ?Plan Discharge plan needs to be updated   ? ?   ?AM-PAC OT "6 Clicks" Daily Activity     ?Outcome Measure ? ? Help from another person eating meals?: None ?Help from another person taking care of personal grooming?: A Little ?Help from another person toileting, which includes using toliet, bedpan, or urinal?: A Little ?Help from another person bathing (including washing, rinsing, drying)?: A Lot ?Help from another person to put on and taking off regular upper body clothing?: A Little ?Help from another person to put on and taking off regular lower body clothing?: A Lot ?6 Click Score: 17 ? ?  ?End of Session Equipment Utilized During Treatment: Oxygen ? ?OT Visit Diagnosis: Unsteadiness on feet (R26.81);History of falling (Z91.81) ?  ?Activity Tolerance Patient tolerated treatment well ?  ?Patient Left in chair;with call bell/phone within reach;with chair alarm set ?  ?Nurse Communication Mobility status (patient reporting back pain) ?  ? ?   ? ?Time: 4825-0037 ?OT Time Calculation (min): 15 min ? ?Charges: OT General Charges ?$OT Visit: 1 Visit ?OT Treatments ?$Self Care/Home Management : 8-22 mins ? ?Marlyce Huge OT ?OT pager: [phone] ? ? ?Carmelia Roller ?10/16/2021, 10:29 AM ?

## 2021-10-17 ENCOUNTER — Inpatient Hospital Stay (HOSPITAL_COMMUNITY): Payer: Medicare Other | Admitting: Anesthesiology

## 2021-10-17 ENCOUNTER — Encounter (HOSPITAL_COMMUNITY): Payer: Self-pay | Admitting: Student

## 2021-10-17 ENCOUNTER — Encounter (HOSPITAL_COMMUNITY)
Admission: EM | Disposition: A | Discharge status: Hospice | Payer: Self-pay | Source: Home / Self Care | Attending: Internal Medicine

## 2021-10-17 DIAGNOSIS — K2970 Gastritis, unspecified, without bleeding: Secondary | ICD-10-CM

## 2021-10-17 DIAGNOSIS — K635 Polyp of colon: Secondary | ICD-10-CM

## 2021-10-17 DIAGNOSIS — K31819 Angiodysplasia of stomach and duodenum without bleeding: Secondary | ICD-10-CM

## 2021-10-17 DIAGNOSIS — R55 Syncope and collapse: Secondary | ICD-10-CM

## 2021-10-17 DIAGNOSIS — R195 Other fecal abnormalities: Secondary | ICD-10-CM | POA: Diagnosis present

## 2021-10-17 DIAGNOSIS — D509 Iron deficiency anemia, unspecified: Secondary | ICD-10-CM

## 2021-10-17 HISTORY — PX: COLONOSCOPY: SHX5424

## 2021-10-17 HISTORY — PX: BIOPSY: SHX5522

## 2021-10-17 HISTORY — PX: ESOPHAGOGASTRODUODENOSCOPY: SHX5428

## 2021-10-17 LAB — CBC
HCT: 26.1 % — ABNORMAL LOW (ref 36.0–46.0)
Hemoglobin: 8.3 g/dL — ABNORMAL LOW (ref 12.0–15.0)
MCH: 27.9 pg (ref 26.0–34.0)
MCHC: 31.8 g/dL (ref 30.0–36.0)
MCV: 87.6 fL (ref 80.0–100.0)
Platelets: 295 10*3/uL (ref 150–400)
RBC: 2.98 MIL/uL — ABNORMAL LOW (ref 3.87–5.11)
RDW: 14.4 % (ref 11.5–15.5)
WBC: 7.6 10*3/uL (ref 4.0–10.5)
nRBC: 0 % (ref 0.0–0.2)

## 2021-10-17 LAB — BASIC METABOLIC PANEL
Anion gap: 7 (ref 5–15)
BUN: 9 mg/dL (ref 8–23)
CO2: 24 mmol/L (ref 22–32)
Calcium: 8.4 mg/dL — ABNORMAL LOW (ref 8.9–10.3)
Chloride: 106 mmol/L (ref 98–111)
Creatinine, Ser: 0.8 mg/dL (ref 0.44–1.00)
GFR, Estimated: >60 mL/min (ref >60)
Glucose, Bld: 93 mg/dL (ref 70–99)
Potassium: 4.2 mmol/L (ref 3.5–5.1)
Sodium: 137 mmol/L (ref 135–145)

## 2021-10-17 SURGERY — EGD (ESOPHAGOGASTRODUODENOSCOPY)
Anesthesia: Monitor Anesthesia Care

## 2021-10-17 MED ORDER — PROPOFOL 500 MG/50ML IV EMUL
INTRAVENOUS | Status: DC | PRN
  Administered 2021-10-17: 20 mg via INTRAVENOUS
  Administered 2021-10-17: 30 mg via INTRAVENOUS

## 2021-10-17 MED ORDER — ALBUTEROL SULFATE (2.5 MG/3ML) 0.083% IN NEBU
INHALATION_SOLUTION | RESPIRATORY_TRACT | Status: AC
  Filled 2021-10-17: qty 3

## 2021-10-17 MED ORDER — PEG 3350-KCL-NA BICARB-NACL 420 G PO SOLR
4000.0000 mL | Freq: Once | ORAL | Status: AC
  Administered 2021-10-17: 4000 mL via ORAL

## 2021-10-17 MED ORDER — PROPOFOL 500 MG/50ML IV EMUL
INTRAVENOUS | Status: DC | PRN
  Administered 2021-10-17: 100 ug/kg/min via INTRAVENOUS

## 2021-10-17 MED ORDER — MORPHINE SULFATE (PF) 2 MG/ML IV SOLN
1.0000 mg | Freq: Once | INTRAVENOUS | Status: AC
  Administered 2021-10-17: 1 mg via INTRAVENOUS
  Filled 2021-10-17: qty 1

## 2021-10-17 MED ORDER — LACTATED RINGERS IV SOLN
INTRAVENOUS | Status: AC | PRN
  Administered 2021-10-17: 1000 mL via INTRAVENOUS

## 2021-10-17 MED ORDER — PHENYLEPHRINE 80 MCG/ML (10ML) SYRINGE FOR IV PUSH (FOR BLOOD PRESSURE SUPPORT)
PREFILLED_SYRINGE | INTRAVENOUS | Status: DC | PRN
  Administered 2021-10-17: 80 ug via INTRAVENOUS

## 2021-10-17 MED ORDER — SODIUM CHLORIDE 0.9 % IV SOLN: INTRAVENOUS | Status: DC

## 2021-10-17 MED ORDER — LIDOCAINE HCL (CARDIAC) PF 100 MG/5ML IV SOSY
PREFILLED_SYRINGE | INTRAVENOUS | Status: DC | PRN
  Administered 2021-10-17: 60 mg via INTRAVENOUS

## 2021-10-17 MED ORDER — MORPHINE SULFATE (PF) 2 MG/ML IV SOLN
1.0000 mg | INTRAVENOUS | Status: DC | PRN
  Administered 2021-10-17 – 2021-10-18 (×8): 1 mg via INTRAVENOUS
  Filled 2021-10-17 (×9): qty 1

## 2021-10-17 NOTE — Op Note (Signed)
[facility] ?Patient Name: Amy Mckay ?Procedure Date: 10/17/2021 ?MRN: [id number] ?Attending MD: Lear Ng , MD ?Date of Birth: 1934-10-17 ?CSN: [id number] ?Age: 87 ?Admit Type: Inpatient ?Procedure:                Upper GI endoscopy ?Indications:              Iron deficiency anemia, Dysphagia ?Providers:                Lear Ng, MD, Allayne Gitelman, RN, Elberta Fortis  ?                          Johnella Moloney, Technician ?Referring MD:             hospital team ?Medicines:                Propofol per Anesthesia, Monitored Anesthesia Care ?Complications:            No immediate complications. ?Estimated Blood Loss:     Estimated blood loss: none. ?Procedure:                Pre-Anesthesia Assessment: ?                          - Prior to the procedure, a History and Physical  ?                          was performed, and patient medications and  ?                          allergies were reviewed. The patient's tolerance of  ?                          previous anesthesia was also reviewed. The risks  ?                          and benefits of the procedure and the sedation  ?                          options and risks were discussed with the patient.  ?                          All questions were answered, and informed consent  ?                          was obtained. Prior Anticoagulants: The patient has  ?                          taken Plavix (clopidogrel), last dose was stopped  ?                          at admission. ASA Grade Assessment: III - A patient  ?                          with severe systemic disease. After reviewing the  ?  risks and benefits, the patient was deemed in  ?                          satisfactory condition to undergo the procedure. ?                          After obtaining informed consent, the endoscope was  ?                          passed under direct vision. Throughout the  ?                          procedure, the patient's blood  pressure, pulse, and  ?                          oxygen saturations were monitored continuously. The  ?                          [id number]-[id number] ([id number]) Olympus endoscope was introduced  ?                          through the mouth, and advanced to the second part  ?                          of duodenum. The upper GI endoscopy was  ?                          accomplished without difficulty. The patient  ?                          tolerated the procedure well. ?Scope In: ?Scope Out: ?Findings: ?     The examined esophagus was normal. ?     The Z-line was regular and was found 42 cm from the incisors. ?     Segmental mild inflammation characterized by congestion (edema) and  ?     erythema was found in the gastric antrum. ?     The cardia and gastric fundus were normal on retroflexion. ?     A single diminutive angiodysplastic lesion without bleeding was found in  ?     the second portion of the duodenum. ?     The exam of the duodenum was otherwise normal. ?Impression:               - Normal esophagus. ?                          - Z-line regular, 42 cm from the incisors. ?                          - Gastritis. ?                          - A single non-bleeding angiodysplastic lesion in  ?                          the duodenum. ?                          -  No specimens collected. ?Moderate Sedation: ?     N/A - MAC procedure ?Recommendation:           - Clear liquid diet. ?                          - Proceed with colonoscopy. ?Procedure Code(s):        --- Professional --- ?                          [phone], Esophagogastroduodenoscopy, flexible,  ?                          transoral; diagnostic, including collection of  ?                          specimen(s) by brushing or washing, when performed  ?                          (separate procedure) ?Diagnosis Code(s):        --- Professional --- ?                          D50.9, Iron deficiency anemia, unspecified ?                          K31.819, Angiodysplasia of stomach and  duodenum  ?                          without bleeding ?                          R13.10, Dysphagia, unspecified ?                          K29.70, Gastritis, unspecified, without bleeding ?CPT copyright 2019 [employer]. All rights reserved. ?The codes documented in this report are preliminary and upon coder review may  ?be revised to meet current compliance requirements. ?Lear Ng, MD ?10/17/2021 2:08:30 PM ?This report has been signed electronically. ?Number of Addenda: 0 ?

## 2021-10-17 NOTE — Op Note (Signed)
[facility] ?Patient Name: Amy Mckay ?Procedure Date: 10/17/2021 ?MRN: [id number] ?Attending MD: Lear Ng , MD ?Date of Birth: Oct 14, 1934 ?CSN: [id number] ?Age: 87 ?Admit Type: Inpatient ?Procedure:                Colonoscopy ?Indications:              Last colonoscopy: date unknown, Iron deficiency  ?                          anemia ?Providers:                Lear Ng, MD, Allayne Gitelman, RN, Elberta Fortis  ?                          Johnella Moloney, Technician ?Referring MD:             hospital team ?Medicines:                Propofol per Anesthesia, Monitored Anesthesia Care ?Complications:            No immediate complications. ?Estimated Blood Loss:     Estimated blood loss was minimal. ?Procedure:                Pre-Anesthesia Assessment: ?                          - Prior to the procedure, a History and Physical  ?                          was performed, and patient medications and  ?                          allergies were reviewed. The patient's tolerance of  ?                          previous anesthesia was also reviewed. The risks  ?                          and benefits of the procedure and the sedation  ?                          options and risks were discussed with the patient.  ?                          All questions were answered, and informed consent  ?                          was obtained. Prior Anticoagulants: The patient has  ?                          taken Plavix (clopidogrel), last dose was stopped  ?                          at admission. ASA Grade Assessment: III - A patient  ?  with severe systemic disease. After reviewing the  ?                          risks and benefits, the patient was deemed in  ?                          satisfactory condition to undergo the procedure. ?                          After obtaining informed consent, the colonoscope  ?                          was passed under direct vision. Throughout the  ?                           procedure, the patient's blood pressure, pulse, and  ?                          oxygen saturations were monitored continuously. The  ?                          PCF-HQ190L ([id number]) Olympus colonoscope was  ?                          introduced through the anus and advanced to the the  ?                          cecum, identified by appendiceal orifice and  ?                          ileocecal valve. The colonoscopy was performed with  ?                          difficulty due to poor bowel prep with stool  ?                          present, significant looping and a tortuous colon.  ?                          The patient tolerated the procedure well. The  ?                          quality of the bowel preparation was poor and not  ?                          adequate to identify polyps 6 mm and larger in  ?                          size. The ileocecal valve, appendiceal orifice, and  ?                          rectum were photographed. ?Scope In: 1:47:04 PM ?Scope Out: 2:00:33 PM ?Scope Withdrawal Time: 0 hours 6 minutes 24 seconds  ?  Total Procedure Duration: 0 hours 13 minutes 29 seconds  ?Findings: ?     The perianal and digital rectal examinations were normal. ?     A segmental area of moderately congested, erythematous and ulcerated  ?     mucosa was found in the distal rectum. Biopsies were taken with a cold  ?     forceps for histology. Estimated blood loss was minimal. ?     Two sessile, non-bleeding polyps were found in the ascending colon. The  ?     polyps were 6 mm in size. Polypectomy was not attempted due to  ?     inadequate bowel preparation and poor endoscopic visualization. ?     A 3 mm polyp was found in the rectum. The polyp was semi-sessile.  ?     Polypectomy was not attempted due to inadequate bowel preparation and  ?     poor endoscopic visualization. ?     Copious quantities of semi-liquid semi-solid solid stool was found in  ?     the entire colon, precluding visualization. ?      Internal hemorrhoids were found during retroflexion. The hemorrhoids  ?     were small and Grade I (internal hemorrhoids that do not prolapse). ?Impression:               - Preparation of the colon was poor. ?                          - Preparation of the colon was inadequate. ?                          - Congested, erythematous and ulcerated mucosa in  ?                          the distal rectum. Biopsied. ?                          - Two 6 mm, non-bleeding polyps in the ascending  ?                          colon. Resection not attempted. ?                          - One 3 mm polyp in the rectum. Resection not  ?                          attempted. ?                          - Stool in the entire examined colon. ?                          - Internal hemorrhoids. ?Moderate Sedation: ?     N/A - MAC procedure ?Recommendation:           - Clear liquid diet. ?                          - Await pathology results. ?                          -  Repeat colonoscopy at a date to be determined  ?                          because the bowel preparation was poor. ?Procedure Code(s):        --- Professional --- ?                          [phone], Colonoscopy, flexible; with biopsy, single  ?                          or multiple ?Diagnosis Code(s):        --- Professional --- ?                          D50.9, Iron deficiency anemia, unspecified ?                          K62.1, Rectal polyp ?                          K63.5, Polyp of colon ?                          K62.6, Ulcer of anus and rectum ?                          K64.0, First degree hemorrhoids ?                          K62.89, Other specified diseases of anus and rectum ?CPT copyright 2019 [employer]. All rights reserved. ?The codes documented in this report are preliminary and upon coder review may  ?be revised to meet current compliance requirements. ?Lear Ng, MD ?10/17/2021 2:17:56 PM ?This report has been signed electronically. ?Number of  Addenda: 0 ?

## 2021-10-17 NOTE — Anesthesia Postprocedure Evaluation (Signed)
Anesthesia Post Note ? ?Patient: Amy Mckay ? ?Procedure(s) Performed: ESOPHAGOGASTRODUODENOSCOPY (EGD) ?COLONOSCOPY ?BIOPSY ? ?  ? ?Patient location during evaluation: Endoscopy ?Anesthesia Type: MAC ?Level of consciousness: awake and alert, patient cooperative and oriented ?Pain management: pain level controlled ?Vital Signs Assessment: post-procedure vital signs reviewed and stable ?Respiratory status: nonlabored ventilation, spontaneous breathing, respiratory function stable and patient connected to nasal cannula oxygen ?Cardiovascular status: stable and blood pressure returned to baseline ?Postop Assessment: no apparent nausea or vomiting ?Anesthetic complications: no ? ? ?No notable events documented. ? ?Last Vitals:  ?Vitals:  ? 10/17/21 1410 10/17/21 1420  ?BP: (!) 169/86 (!) 177/101  ?Pulse: 80 83  ?Resp: 16 20  ?Temp:    ?SpO2: 100% 94%  ?  ?Last Pain:  ?Vitals:  ? 10/17/21 1420  ?TempSrc:   ?PainSc: 0-No pain  ? ? ?  ?  ?  ?  ?  ?  ? ?Melanie Pellot,E. Valerya Maxton ? ? ? ? ?

## 2021-10-17 NOTE — Anesthesia Preprocedure Evaluation (Addendum)
Anesthesia Evaluation  ?Patient identified by MRN, date of birth, ID band ?Patient awake ? ? ? ?Reviewed: ?Allergy & Precautions, NPO status , Patient's Chart, lab work & pertinent test results ? ?Airway ?Mallampati: II ? ?TM Distance: >3 FB ?Neck ROM: Full ? ? ? Dental ? ?(+) Edentulous Upper, Partial Lower, Dental Advisory Given ?  ?Pulmonary ?COPD,  COPD inhaler and oxygen dependent, Current Smoker and Patient abstained from smoking.,  ?  ? ?+ wheezing ? ? ? ? ? Cardiovascular ?hypertension, Pt. on medications ?(-) angina ?Rhythm:Regular Rate:Normal ? ? ?  ?Neuro/Psych ?negative neurological ROS ?   ? GI/Hepatic ?Neg liver ROS, GERD  Medicated and Controlled,  ?Endo/Other  ?Hypothyroidism  ? Renal/GU ?Renal InsufficiencyRenal disease  ? ?  ?Musculoskeletal ? ?(+) Arthritis ,  ? Abdominal ?  ?Peds ? Hematology ? ?(+) Blood dyscrasia (Hb 8.3), anemia ,   ?Anesthesia Other Findings ? ? Reproductive/Obstetrics ? ?  ? ? ? ? ? ? ? ? ? ? ? ? ? ?  ?  ? ? ? ? ? ? ? ?Anesthesia Physical ?Anesthesia Plan ? ?ASA: 3 ? ?Anesthesia Plan: MAC  ? ?Post-op Pain Management: Minimal or no pain anticipated  ? ?Induction:  ? ?PONV Risk Score and Plan: 1 and Ondansetron and Treatment may vary due to age or medical condition ? ?Airway Management Planned: Nasal Cannula and Natural Airway ? ?Additional Equipment: None ? ?Intra-op Plan:  ? ?Post-operative Plan:  ? ?Informed Consent: I have reviewed the patients History and Physical, chart, labs and discussed the procedure including the risks, benefits and alternatives for the proposed anesthesia with the patient or authorized representative who has indicated his/her understanding and acceptance.  ? ?Patient has DNR.  ?Suspend DNR and Discussed DNR with patient. ?  ?Dental advisory given ? ?Plan Discussed with: CRNA and Surgeon ? ?Anesthesia Plan Comments:   ? ? ? ? ? ?Anesthesia Quick Evaluation ? ?

## 2021-10-17 NOTE — Care Management Important Message (Signed)
Important Message ? ?Patient Details IM Letter given to the Patient. ?Name: Amy Mckay ?MRN: [id number] ?Date of Birth: 1935/03/06 ? ? ?Medicare Important Message Given:  Yes ? ? ? ? ?Caren Macadam ?10/17/2021, 12:46 PM ?

## 2021-10-17 NOTE — Transfer of Care (Signed)
Immediate Anesthesia Transfer of Care Note ? ?Patient: Amy Mckay ? ?Procedure(s) Performed: Procedure(s): ?ESOPHAGOGASTRODUODENOSCOPY (EGD) (N/A) ?COLONOSCOPY (N/A) ?BIOPSY ? ?Patient Location: PACU ? ?Anesthesia Type:MAC ? ?Level of Consciousness:  sedated, patient cooperative and responds to stimulation ? ?Airway & Oxygen Therapy:Patient Spontanous Breathing and Patient connected to face mask oxgen ? ?Post-op Assessment:  Report given to PACU RN and Post -op Vital signs reviewed and stable ? ?Post vital signs:  Reviewed and stable ? ?Last Vitals:  ?Vitals:  ? 10/17/21 1242 10/17/21 1406  ?BP: (!) 145/89   ?Pulse: 86 81  ?Resp: 14 14  ?Temp: 36.9 ?C 36.6 ?C  ?SpO2: 98% 100%  ? ? ?Complications: No apparent anesthesia complications ? ?

## 2021-10-17 NOTE — Interval H&P Note (Signed)
History and Physical Interval Note: ? ?10/17/2021 ?1:17 PM ? ?Amy Mckay  has presented today for surgery, with the diagnosis of anemia, abdominal pain.  The various methods of treatment have been discussed with the patient and family. After consideration of risks, benefits and other options for treatment, the patient has consented to  Procedure(s): ?ESOPHAGOGASTRODUODENOSCOPY (EGD) (N/A) ?COLONOSCOPY (N/A) as a surgical intervention.  The patient's history has been reviewed, patient examined, no change in status, stable for surgery.  I have reviewed the patient's chart and labs.  Questions were answered to the patient's satisfaction.   ? ? ?Shirley Friar ? ? ?

## 2021-10-17 NOTE — Plan of Care (Signed)
?  Problem: Health Behavior/Discharge Planning: Goal: Ability to manage health-related needs will improve Outcome: Progressing   Problem: Clinical Measurements: Goal: Ability to maintain clinical measurements within normal limits will improve Outcome: Progressing Goal: Will remain free from infection Outcome: Progressing Goal: Diagnostic test results will improve Outcome: Progressing Goal: Respiratory complications will improve Outcome: Progressing Goal: Cardiovascular complication will be avoided Outcome: Progressing   Problem: Activity: Goal: Risk for activity intolerance will decrease Outcome: Progressing   Problem: Nutrition: Goal: Adequate nutrition will be maintained Outcome: Progressing   Problem: Coping: Goal: Level of anxiety will decrease Outcome: Progressing   Problem: Elimination: Goal: Will not experience complications related to bowel motility Outcome: Progressing Goal: Will not experience complications related to urinary retention Outcome: Progressing   Problem: Pain Managment: Goal: General experience of comfort will improve Outcome: Progressing   Problem: Safety: Goal: Ability to remain free from injury will improve Outcome: Progressing   Problem: Skin Integrity: Goal: Risk for impaired skin integrity will decrease Outcome: Progressing   Problem: Education: Goal: Knowledge of General Education information will improve Description: Including pain rating scale, medication(s)/side effects and non-pharmacologic comfort measures Outcome: Completed/Met   

## 2021-10-17 NOTE — Progress Notes (Signed)
?PROGRESS NOTE ?Amy Mckay  PQZ:[id number] DOB: [birth year] DOA: 10/13/2021 ?PCP: Annita Brod, MD  ? ?Brief Narrative/Hospital Course: ?[name] with PMH of COPD/chronic RF on 3 to 4 L, PAD/SMA stent, chronic hyponatremia, RLS, HTN, hypothyroidism, GERD, chronic pain syndrome and cognitive impairment presenting with dizziness, shortness of breath and possible melena.  She was hypotensive to 84/63 on arrival.  Hgb 9.3 (12.8 in 01/2021).  Hemoccult positive.  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  Patient was admitted for near syncope, possible blood loss anemia with positive Hemoccult.  GI consulted-chronic dysphagia likely due to dysmotility barium swallow-limited views no stricture esophageal dysmotility noted being managed conservatively per GI.  On GI follow-up and after discussion with patient's daughter planning to pursue EGD colonoscopy 5/5.  Hemoglobin remains stable.  ?  ?Subjective: ?Seen and examined this morning.  Has been n.p.o. for procedure today having clear bowel movement, complains of pain all over on abdomen,lower back and arms.  ?On Nasal cannula 3l-4l home setting ? ?Assessment and Plan: ?Principal Problem: ?  Near syncope ?Active Problems: ?  Iron deficiency anemia due to chronic blood loss ?  Esophageal dysphagia ?  Acute toxic encephalopathy ?  Chronic lower back pain ?  COPD (chronic obstructive pulmonary disease) (HCC) ?  Chronic respiratory failure with hypoxia (HCC) ?  Peripheral arterial disease (HCC) ?  Benign essential HTN ?  Hypothyroidism ?  Memory impairment ?  Restless leg ?  Chronic hyponatremia ?  Tobacco use disorder ?  Pulmonary nodules/lesions, multiple ?  Orthostatic hypotension ? ?Near syncope ?Orthostatic hypotension: ?Multifactorial presentation due to orthostatic hypotension,  anemia, chronically occluded right vertebral artery.  Managed with IV fluids, orthostatic hypotension resolved.  Overall clinically stable, holding antihypertensives, minimize polypharmacy- she is on oxycodone,gabapentin and Flexeril- discussed risks.  Duplex reviewed  see below. PT OT to continue and needs SNF. ? ?Bilateral carotid artery disease: duplex carotids bilaterally shows 40-59% stenosis ICA, nonhemodynamically significant plaque <50% stenosed, the ECA appears more than 50% stenosed.  Left vertebral artery shows presteal syndrome, left subclavian artery stenotic,, rt s/c artery post stenotic, suggestive of more proximal disease-followed by Dr. Randie Heinz who was planning for outpatient carotid duplex-discussed with on call [facility] Dr. Myra Gianotti- highly unlikely that it is causing any symptoms and he advised outpatient follow-up.  Patient reports that she has had carotid blockages 40-59% for last few years. ? ?Chronic dysphagia likely due to mild dysmotility:Reportedly had an episode of regurgitation.Esophagram somewhat limited examination but concerning for esophageal dysmotility.  Awaiting EGD colonoscopy.  ? ?Iron deficiency anemia due to chronic blood loss ?Hemoccult positive stool: ?GI following and  for EGD colonoscopy 5/5. S/P IV ferric gluconate 250 mg x 2, and home Plavix and aspirin remains on hold. Cont PPI BID.  Monitor hemoglobin. ?Recent Labs  ?Lab 10/15/21 ?0752 10/15/21 ?1542 10/16/21 ?0008 10/16/21 ?3354 10/17/21 ?0503  ?HGB 9.0* 8.8* 8.7* 9.5* 8.3*  ?HCT 29.4* 28.1* 27.4* 30.5* 26.1*  ? ?  ?Acute toxic encephalopathy ?Polypharmacy: ?Per daughter delusional when EMS arrived, patient reports she has been confused forgetful when she was admitted but she is now alert awake at baseline.CT head no acute neurological deficit at this time mentation is stable. Continue PT OT  ? 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 ? ?COPD ?Chronic respiratory failure with hypoxia on 4 L nasal cannula: ?Reportedly had wheezing on presentation.  At this time shortness of breath is stable at baseline on 4 L nasal cannula, continue Incruse/Dulera, DuoNeb, supplemental oxygen, smoking cessation was discussed previously. ? ?Peripheral arterial disease:S/p remote SMA stenting. Aspirin Plavix on hold due to anemia Hemoccult positive stool hopefully resume once okay with GI, continue statin.   ? ?Pulmonary nodules/lesions, multiple:Repeat CT without contrast in 3 to 6 months recommended- please provide her with instructions on discharge as patient is considered high risk given lifelong smoking. ? ?Tobacco use disorder:Cessation advised.   ? ?Chronic hyponatremiaSodium stable.   ? ?Restless leg-Continue home meds. ? ?Hypothyroidism ?Continue Synthroid. ? ?Hypertension not taking medication losartan.  Blood pressure stable  ? ?DVT prophylaxis: SCDs Start: 10/13/21 2016 ?Code Status:   Code Status: DNR ?Family Communication: plan of care discussed with patientat bedside. ?Patient status is: Inpatient because of ongoing GI evaluation and diet tolerance ?Level of care: Telemetry  ? ?Dispo: The patient is from: home.  Reports she is followed by palliative care hospice ?           Anticipated disposition: Home with hospice/palliative care once cleared by GI 24-48 hrs ? ?Mobility Assessment (last 72 hours)   ? ? Mobility Assessment   ? ? Row Name 10/17/21 0900 10/16/21 2200 10/16/21 1600 10/16/21 1000 10/16/21 0900  ? Does patient have an order for bedrest or is patient medically unstable No - Continue assessment No - Continue assessment No -  Continue assessment -- No - Continue assessment  ? What is the highest level of mobility based on the progressive mobility assessment? -- Level 5 (Walks with assist in room/hall) - Balance while stepping forward/back and can walk in room with assist - Complete Level 5 (Walks with assist in room/hall) - Balance while stepping forward/back and can walk in room with assist - Complete Level 4 (Walks with assist in room) - Balance while marching in place and cannot step forward and back - Complete Level 4 (Walks with assist in room) - Balance while marching in place and cannot step forward and back - Complete  ? Is the above level different from baseline mobility prior to current illness? -- Yes - Recommend PT order -- -- Yes - Recommend PT order  ? ? Row Name 10/15/21 2000 10/15/21 0848 10/14/21 2112 10/14/21 1542 10/14/21 1504  ? Does patient have an order for bedrest or is patient medically unstable No - Continue assessment No - Continue assessment No - Continue assessment -- --  ? What is the highest level of mobility based on the progressive mobility assessment? Level 3 (Stands with assist) - Balance while standing  and cannot march in place Level 3 (Stands with assist) - Balance while standing  and cannot march in place Level 5 (Walks with assist in room/hall) - Balance while stepping forward/back and can walk in room with assist - Complete Level 5 (Walks with assist in room/hall) - Balance while stepping forward/back and can walk in room with assist - Complete Level 5 (Walks with assist in room/hall) - Balance while stepping forward/back and can walk in room with assist - Complete  ? Is the above level different from baseline mobility prior  to current illness? Yes - Recommend PT order Yes - Recommend PT order Yes - Recommend PT order -- --  ? ?  ?  ? ?  ?  ? ?Objective: ?Vitals last 24 hrs: ?Vitals:  ? 10/17/21 0500 10/17/21 0606 10/17/21 0849 10/17/21 1242  ?BP:  107/76  (!) 145/89  ?Pulse:  87  86  ?Resp:  17   14  ?Temp:  98.1 ?F (36.7 ?C)  98.4 ?F (36.9 ?C)  ?TempSrc:  Oral  Temporal  ?SpO2:  98% 97% 98%  ?Weight: 56.2 kg   54.9 kg  ?Height:    5\' 1"  (1.549 m)  ? ?Weight change: 2 kg ? ?Physical Examination: ?[facility]

## 2021-10-17 NOTE — Progress Notes (Incomplete)
Admit date:10/13/2021 ?Chief compliant: melena, dizziness ?[name] with PMH of COPD/chronic RF on 3 to 4 L, PAD/SMA stent, chronic hyponatremia, RLS, HTN, hypothyroidism, GERD, chronic pain syndrome and cognitive impairment presenting with dizziness, shortness of breath and melena.   ?ED Course: Amy Mckay was hypotensive to 84/63 on arrival.  Hgb 9.3 (12.8 in 01/2021).  Hemoccult positive.  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   ?Hospital course: Patient admitted to [facility] for near syncope, possible blood loss anemia with positive Hemoccult.  GI consulted-chronic dysphagia likely due to dysmotility barium swallow-limited views no stricture esophageal dysmotility noted being managed conservatively per GI.  On GI follow-up and after discussion with patient's daughter planning to pursue EGD colonoscopy 5/5.  Hemoglobin remains stable.  ? ?Assessment/Plan: ? ?UGI bleed, acute vs chronic blood loss anemia: Seen by GI.   EGD 5/5 showed one small AVM, mild gastritis but otherwise was unrevealing for source of anemia. Distal rectal edematous mucosa suspect due to prolapsed tissue but biopsies taken. Per GI, poorly prepped colon and will need another gallon of colon prep today and repeat colonoscopy 5/6 if prep successful. CLD and NPO p MN.  GI d/w Daughter.S/P IV ferric gluconate 250 mg x 2, and home Plavix and aspirin remains on hold. Cont PPI BID.  Stable hemoglobin. ?Hypotension: in the setting of #1. Managed with IV fluids, orthostatic hypotension resolved.  Overall clinically stable, holding antihypertensives.  ?Dizziness/Near syncope: likely multifactorial with anemia vs Orthostatic vs Medication related vs Vascular occlusive disease. Pt has bilateral carotid artery disease and chronic occlusion of right vertebral artery. IV  fluids, holding antihypertensives (Losartan), minimize polypharmacy- she is on oxycodone,gabapentin and Flexeril- discussed risks. PT OT to continue and needs SNF. ?Peripheral Vascular disease: Pt has Bilateral carotid artery disease 40-59% stenosis ICA,  ECA more than 50% stenosed.  Left vertebral artery shows presteal syndrome, left subclavian artery stenotic,, rt s/c artery post stenotic, suggestive of more proximal disease-followed by Dr. Randie Heinz who was planning for outpatient carotid duplex-discussed with on call VVS Dr. Myra Gianotti- highly unlikely that it is causing any symptoms and he advised outpatient follow-up.  Patient reports that she has had carotid blockages 40-59% for last few years.Also S/p remote SMA stenting.Currently asa, plavix held with #1 ?Chronic dysphagia likely due to mild dysmotility:Reportedly had an episode of regurgitation.Esophagram concerning for esophageal  dysmotility. EGD findings as above ?AMS/ acute encephalopathy: Related to polypharmacy vs hemodynamic instability vs vascular disease. Now resolved, AAOX 3. Per daughter delusional when EMS arrived, patient reports she has been confused forgetful. CT head no acute neurological deficit at this time mentation is stable. Continue PT OT Sedative meds held.  ?Chronic lower back pain, Advanced DJD facet disease and lumbar spines with leftward scoliosis, Bilateral leg weakness: No new focal deficits.On oxycodone and gabapentin and Flexeril.She is at risk for polypharmacy and this was discussed with patient and patient's daughter, off Lexapro. Minimize polypharmacy and to follow-up with PCP for further titrations ?COPD, Chronic respiratory failure with hypoxia on 4 L nasal cannula:Reportedly had wheezing on presentation.  At this time shortness of breath is stable at baseline on 4 L nasal cannula, continue Incruse/Dulera, DuoNeb, supplemental oxygen, smoking cessation was discussed previously. ?Pulmonary nodules/lesions, multiple:Repeat CT  without contrast in 3 to 6 months recommended- will provide her with instructions on discharge as patient is considered high risk given lifelong smoking. Tobacco  cessation advised.    ?Chronic hyponatremiaSodium stable.   ?Restless leg-Continue home meds. ?Hypothyroidism-Continue Synthroid. ? ?DVT prophylaxis:  ?Code Status:  ?Family / Patient Communication:  ?Disposition Plan:   ?Status is: Inpatient ?{Inpatient:23812} ?  ?

## 2021-10-17 NOTE — Brief Op Note (Signed)
10/13/2021 - 10/17/2021 ? ?2:28 PM ? ?PATIENT:  Bethania Shone  87 y.o. female ? ?PRE-OPERATIVE DIAGNOSIS:  anemia, abdominal pain ? ?POST-OPERATIVE DIAGNOSIS:  rectal biopsies ? ?PROCEDURE:  Procedure(s): ?ESOPHAGOGASTRODUODENOSCOPY (EGD) (N/A) ?COLONOSCOPY (N/A) ?BIOPSY ? ?SURGEON:  Surgeon(s) and Role: ?   Wilford Corner, MD - Primary ? ?EGD showed one small AVM and otherwise was unrevealing for source of anemia. Distal rectal edematous mucosa suspect due to prolapsed tissue but biopsies taken. Poorly prepped colon and will need another gallon of colon prep today and repeat colonoscopy tomorrow if prep successful. Clear liquid diet today and NPO p MN. Informed daughter by phone of need for repeat colonoscopy. ?

## 2021-10-18 ENCOUNTER — Inpatient Hospital Stay (HOSPITAL_COMMUNITY): Payer: Medicare Other | Admitting: Certified Registered"

## 2021-10-18 ENCOUNTER — Encounter (HOSPITAL_COMMUNITY)
Admission: EM | Disposition: A | Discharge status: Hospice | Payer: Self-pay | Source: Home / Self Care | Attending: Internal Medicine

## 2021-10-18 ENCOUNTER — Encounter (HOSPITAL_COMMUNITY): Payer: Self-pay | Admitting: Student

## 2021-10-18 DIAGNOSIS — K644 Residual hemorrhoidal skin tags: Secondary | ICD-10-CM

## 2021-10-18 DIAGNOSIS — K6289 Other specified diseases of anus and rectum: Secondary | ICD-10-CM

## 2021-10-18 DIAGNOSIS — K5730 Diverticulosis of large intestine without perforation or abscess without bleeding: Secondary | ICD-10-CM

## 2021-10-18 DIAGNOSIS — K635 Polyp of colon: Secondary | ICD-10-CM

## 2021-10-18 DIAGNOSIS — R55 Syncope and collapse: Secondary | ICD-10-CM | POA: Diagnosis not present

## 2021-10-18 HISTORY — PX: POLYPECTOMY: SHX5525

## 2021-10-18 HISTORY — PX: COLONOSCOPY WITH PROPOFOL: SHX5780

## 2021-10-18 LAB — CBC
HCT: 31.4 % — ABNORMAL LOW (ref 36.0–46.0)
Hemoglobin: 9.6 g/dL — ABNORMAL LOW (ref 12.0–15.0)
MCH: 26.7 pg (ref 26.0–34.0)
MCHC: 30.6 g/dL (ref 30.0–36.0)
MCV: 87.5 fL (ref 80.0–100.0)
Platelets: 348 10*3/uL (ref 150–400)
RBC: 3.59 MIL/uL — ABNORMAL LOW (ref 3.87–5.11)
RDW: 14.6 % (ref 11.5–15.5)
WBC: 9.7 10*3/uL (ref 4.0–10.5)
nRBC: 0 % (ref 0.0–0.2)

## 2021-10-18 LAB — BASIC METABOLIC PANEL
Anion gap: 12 (ref 5–15)
BUN: 6 mg/dL — ABNORMAL LOW (ref 8–23)
CO2: 24 mmol/L (ref 22–32)
Calcium: 9.2 mg/dL (ref 8.9–10.3)
Chloride: 103 mmol/L (ref 98–111)
Creatinine, Ser: 0.67 mg/dL (ref 0.44–1.00)
GFR, Estimated: >60 mL/min (ref >60)
Glucose, Bld: 94 mg/dL (ref 70–99)
Potassium: 3.8 mmol/L (ref 3.5–5.1)
Sodium: 139 mmol/L (ref 135–145)

## 2021-10-18 SURGERY — COLONOSCOPY WITH PROPOFOL
Anesthesia: Monitor Anesthesia Care

## 2021-10-18 MED ORDER — NICOTINE POLACRILEX 2 MG MT GUM: 2.0000 mg | CHEWING_GUM | OROMUCOSAL | 0 refills | Status: DC | PRN

## 2021-10-18 MED ORDER — HYDROCORTISONE ACETATE 25 MG RE SUPP
25.0000 mg | Freq: Two times a day (BID) | RECTAL | Status: DC
  Administered 2021-10-18 (×2): 25 mg via RECTAL
  Filled 2021-10-18 (×2): qty 1

## 2021-10-18 MED ORDER — LACTATED RINGERS IV SOLN
INTRAVENOUS | Status: AC | PRN
Start: 2021-10-18 — End: 2021-10-18
  Administered 2021-10-18: 10 mL/h via INTRAVENOUS

## 2021-10-18 MED ORDER — LIDOCAINE 2% (20 MG/ML) 5 ML SYRINGE
INTRAMUSCULAR | Status: DC | PRN
  Administered 2021-10-18: 60 mg via INTRAVENOUS

## 2021-10-18 MED ORDER — HYDROCORTISONE ACETATE 25 MG RE SUPP: 25.0000 mg | Freq: Two times a day (BID) | RECTAL | 0 refills | Status: DC

## 2021-10-18 MED ORDER — PROPOFOL 500 MG/50ML IV EMUL
INTRAVENOUS | Status: DC | PRN
Start: 2021-10-18 — End: 2021-10-18
  Administered 2021-10-18: 200 ug/kg/min via INTRAVENOUS

## 2021-10-18 MED ORDER — ALBUTEROL SULFATE (2.5 MG/3ML) 0.083% IN NEBU
2.5000 mg | INHALATION_SOLUTION | Freq: Once | RESPIRATORY_TRACT | Status: AC
  Administered 2021-10-18: 2.5 mg via RESPIRATORY_TRACT
  Filled 2021-10-18: qty 3

## 2021-10-18 MED ORDER — ALBUTEROL SULFATE (2.5 MG/3ML) 0.083% IN NEBU: 2.5000 mg | INHALATION_SOLUTION | RESPIRATORY_TRACT | 2 refills | Status: DC | PRN

## 2021-10-18 MED ORDER — ASPIRIN EC 81 MG PO TBEC: 81.0000 mg | DELAYED_RELEASE_TABLET | Freq: Every day | ORAL | 11 refills | Status: DC

## 2021-10-18 MED ORDER — CLOPIDOGREL BISULFATE 75 MG PO TABS
75.0000 mg | ORAL_TABLET | Freq: Every day | ORAL | Status: DC
  Administered 2021-10-18: 75 mg via ORAL
  Filled 2021-10-18: qty 1

## 2021-10-18 MED ORDER — PROPOFOL 10 MG/ML IV BOLUS
INTRAVENOUS | Status: DC | PRN
  Administered 2021-10-18: 40 mg via INTRAVENOUS

## 2021-10-18 MED ORDER — PANTOPRAZOLE SODIUM 40 MG PO TBEC: 40.0000 mg | DELAYED_RELEASE_TABLET | Freq: Two times a day (BID) | ORAL | 1 refills | Status: DC

## 2021-10-18 MED ORDER — PHENYLEPHRINE 80 MCG/ML (10ML) SYRINGE FOR IV PUSH (FOR BLOOD PRESSURE SUPPORT)
PREFILLED_SYRINGE | INTRAVENOUS | Status: DC | PRN
  Administered 2021-10-18: 80 ug via INTRAVENOUS

## 2021-10-18 SURGICAL SUPPLY — 22 items

## 2021-10-18 NOTE — Transfer of Care (Signed)
Immediate Anesthesia Transfer of Care Note ? ?Patient: Amy Mckay ? ?Procedure(s) Performed: COLONOSCOPY WITH PROPOFOL ?POLYPECTOMY ? ?Patient Location: Endoscopy Unit ? ?Anesthesia Type:MAC ? ?Level of Consciousness: awake, alert , oriented and patient cooperative ? ?Airway & Oxygen Therapy: Patient Spontanous Breathing and Patient connected to nasal cannula oxygen ? ?Post-op Assessment: Report given to RN, Post -op Vital signs reviewed and stable and Patient moving all extremities ? ?Post vital signs: Reviewed and stable ? ?Last Vitals:  ?Vitals Value Taken Time  ?BP    ?Temp 36.9 ?C 10/18/21 0915  ?Pulse 79 10/18/21 0917  ?Resp 19 10/18/21 0917  ?SpO2 100 % 10/18/21 0917  ?Vitals shown include unvalidated device data. ? ?Last Pain:  ?Vitals:  ? 10/18/21 0915  ?TempSrc: Temporal  ?PainSc: Asleep  ?   ? ?Patients Stated Pain Goal: 2 (10/18/21 0032) ? ?Complications: No notable events documented. ?

## 2021-10-18 NOTE — TOC Transition Note (Signed)
Transition of Care (TOC) - CM/SW Discharge Note ? ? ?Patient Details  ?Name: Amy Mckay ?MRN: [id number] ?Date of Birth: July 07, 1934 ? ?Transition of Care (TOC) CM/SW Contact:  ?Darleene Cleaver, LCSW ?Phone Number: ?10/18/2021, 4:24 PM ? ? ?Clinical Narrative:    ? ?CSW was informed that patient is active with [facility].  Per patient's daughter they will need EMS transport back home.  CSW contacted Jamal Maes from [facility], and they are aware that she is returning back home today.  Per daughter they have equipment at home, and do not have any other needs.  CSW updated bedside nurse and she is aware that patient is discharging today via EMS.  CSW signing off, please reconsult if social work needs arise. ? ? ?Final next level of care: Home w Hospice Care ?Barriers to Discharge: Barriers Resolved ? ? ?Patient Goals and CMS Choice ?Patient states their goals for this hospitalization and ongoing recovery are:: To return back home with hospice through [facility]. ?CMS Medicare.gov Compare Post Acute Care list provided to:: Patient Represenative (must comment) ?Choice offered to / list presented to : Adult Children ? ?Discharge Placement ?  ?           ?  ?  ?  ?  ? ?Discharge Plan and Services ?  ?Discharge Planning Services: CM Consult ?Post Acute Care Choice: Hospice (Home hospice;rw,cane,[id number])          ?  ?  ?  ?  ?  ?HH Arranged: PT ?HH Agency: Other - See comment ([facility].) ?Date HH Agency Contacted: 10/18/21 ?Time HH Agency Contacted: 1623 ?Representative spoke with at [facility] Agency: Jamal Maes ? ?Social Determinants of Health (SDOH) Interventions ?  ? ? ?Readmission Risk Interventions ?   ? View : No data to display.  ?  ?  ?  ? ? ? ? ? ?

## 2021-10-18 NOTE — Op Note (Signed)
[facility] ?Patient Name: Amy Mckay ?Procedure Date: 10/18/2021 ?MRN: [id number] ?Attending MD: Kathi Der , MD ?Date of Birth: 1935/01/14 ?CSN: [id number] ?Age: 87 ?Admit Type: Inpatient ?Procedure:                Colonoscopy ?Indications:              Generalized abdominal pain, Iron deficiency anemia ?Providers:                Kathi Der, MD, Vicki Mallet, RN, Haiven  ?                          Houle, Technician ?Referring MD:              ?Medicines:                Sedation Administered by an Anesthesia Professional ?Complications:            No immediate complications. ?Estimated Blood Loss:     Estimated blood loss was minimal. ?Procedure:                Pre-Anesthesia Assessment: ?                          - Prior to the procedure, a History and Physical  ?                          was performed, and patient medications and  ?                          allergies were reviewed. The patient's tolerance of  ?                          previous anesthesia was also reviewed. The risks  ?                          and benefits of the procedure and the sedation  ?                          options and risks were discussed with the patient.  ?                          All questions were answered, and informed consent  ?                          was obtained. Prior Anticoagulants: The patient has  ?                          taken Plavix (clopidogrel), last dose was 5 days  ?                          prior to procedure. ASA Grade Assessment: III - A  ?                          patient with severe systemic disease. After  ?  reviewing the risks and benefits, the patient was  ?                          deemed in satisfactory condition to undergo the  ?                          procedure. ?                          After obtaining informed consent, the colonoscope  ?                          was passed under direct vision. Throughout the  ?                           procedure, the patient's blood pressure, pulse, and  ?                          oxygen saturations were monitored continuously. The  ?                          PCF-HQ190L ([id number]) Olympus colonoscope was  ?                          introduced through the anus and advanced to the the  ?                          terminal ileum, with identification of the  ?                          appendiceal orifice and IC valve. The colonoscopy  ?                          was performed without difficulty. The patient  ?                          tolerated the procedure well. The quality of the  ?                          bowel preparation was fair. ?Scope In: 8:52:58 AM ?Scope Out: 9:10:08 AM ?Scope Withdrawal Time: 0 hours 9 minutes 22 seconds  ?Total Procedure Duration: 0 hours 17 minutes 10 seconds  ?Findings: ?     Skin tags were found on perianal exam. ?     The terminal ileum appeared normal. ?     Two sessile polyps were found in the proximal ascending colon and  ?     ileocecal valve. The polyps were small in size. These polyps were  ?     removed with a cold snare. Resection and retrieval were complete. ?     Many small and large-mouthed diverticula were found in the sigmoid colon  ?     and descending colon. ?     A localized area of moderately inflamed and thickened folds of the  ?     mucosa was found in the distal rectum. ?Impression:               -  Preparation of the colon was fair. ?                          - Perianal skin tags found on perianal exam. ?                          - The examined portion of the ileum was normal. ?                          - Two small polyps in the proximal ascending colon  ?                          and at the ileocecal valve, removed with a cold  ?                          snare. Resected and retrieved. ?                          - Diverticulosis in the sigmoid colon and in the  ?                          descending colon. ?                          - Inflamed and thickened folds of  the mucosa in the  ?                          distal rectum. ?Moderate Sedation: ?     Moderate (conscious) sedation was personally administered by an  ?     anesthesia professional. The following parameters were monitored: oxygen  ?     saturation, heart rate, blood pressure, and response to care. ?Recommendation:           - Return patient to hospital ward for ongoing care. ?                          - Soft diet. ?                          - Continue present medications. ?                          - Await pathology results. ?                          - Repeat colonoscopy is not recommended for  ?                          surveillance. ?                          - Return to GI office PRN. ?                          - Use hydrocortisone suppository 25 mg 2 per rectum  ?  once a day for 2 weeks. ?Procedure Code(s):        --- Professional --- ?                          [phone], Colonoscopy, flexible; with removal of  ?                          tumor(s), polyp(s), or other lesion(s) by snare  ?                          technique ?Diagnosis Code(s):        --- Professional --- ?                          K63.5, Polyp of colon ?                          K62.89, Other specified diseases of anus and rectum ?                          K64.4, Residual hemorrhoidal skin tags ?                          R10.84, Generalized abdominal pain ?                          D50.9, Iron deficiency anemia, unspecified ?                          K57.30, Diverticulosis of large intestine without  ?                          perforation or abscess without bleeding ?CPT copyright 2019 American Medical Association. All rights reserved. ?The codes documented in this report are preliminary and upon coder review may  ?be revised to meet current compliance requirements. ?Kathi Der, MD ?Kathi Der, MD ?10/18/2021 9:26:12 AM ?Number of Addenda: 0 ?

## 2021-10-18 NOTE — Anesthesia Postprocedure Evaluation (Signed)
Anesthesia Post Note ? ?Patient: Amy Mckay ? ?Procedure(s) Performed: COLONOSCOPY WITH PROPOFOL ?POLYPECTOMY ? ?  ? ?Patient location during evaluation: PACU ?Anesthesia Type: MAC ?Level of consciousness: awake and alert ?Pain management: pain level controlled ?Vital Signs Assessment: post-procedure vital signs reviewed and stable ?Respiratory status: spontaneous breathing, nonlabored ventilation and respiratory function stable ?Cardiovascular status: blood pressure returned to baseline ?Postop Assessment: no apparent nausea or vomiting ?Anesthetic complications: no ? ? ?No notable events documented. ? ?Last Vitals:  ?Vitals:  ? 10/18/21 0920 10/18/21 0930  ?BP: 98/66 131/78  ?Pulse: 76 79  ?Resp: 18 15  ?Temp:    ?SpO2: 99% 100%  ?  ?Last Pain:  ?Vitals:  ? 10/18/21 0920  ?TempSrc:   ?PainSc: Asleep  ? ? ?  ?  ?  ?  ?  ?  ? ?Marthenia Rolling ? ? ? ? ?

## 2021-10-18 NOTE — Brief Op Note (Signed)
10/13/2021 - 10/18/2021 ? ?9:27 AM ? ?PATIENT:  Amy Mckay  87 y.o. female ? ?PRE-OPERATIVE DIAGNOSIS:  anemia ? ?POST-OPERATIVE DIAGNOSIS:  ascending and IC valve polypectomy, diverticulosis, rectal ulcer  ? ?PROCEDURE:  Procedure(s): ?COLONOSCOPY WITH PROPOFOL (N/A) ?POLYPECTOMY ? ?SURGEON:  Surgeon(s) and Role: ?   * Kathi Der, MD - Primary ? ?Findings ?----------- ?-Colonoscopy showed normal TI, 2 small polyps at ileocecal valve and ascending colon, diverticulosis, fair prep and inflamed mucosa in the distal rectum.  Biopsies were taken yesterday. ? ?Recommendation ?------------------------- ?-No evidence of active bleeding. ?-Start soft diet and advance as tolerated ?-Start Anusol suppository twice a day for 2 weeks ?-No further inpatient GI work-up planned.  GI will sign off.  Call us back if needed ? ?Kathi Der MD, FACP ?10/18/2021, 9:28 AM ? ?Contact #  [phone]  ? ?

## 2021-10-18 NOTE — Discharge Summary (Signed)
Physician Discharge Summary  ?Amy BeltonDelores Mckay ZOX:[id number] DOB: [birth year] DOA: 10/13/2021 ? ?PCP: Amy Mckay, Philip, MD ? ?Admit date: 10/13/2021 ?Discharge date: 10/18/2021 ?Consultations: Gastroenterology ?Admitted From: home ?Disposition: home with home hospice ? ?Discharge Diagnoses:  ?Principal Problem: ?  Near syncope ?Active Problems: ?  Iron deficiency anemia due to chronic blood loss ?  Esophageal dysphagia ?  Acute toxic encephalopathy ?  Chronic lower back pain ?  COPD (chronic obstructive pulmonary disease) (HCC) ?  Chronic respiratory failure with hypoxia (HCC) ?  Peripheral arterial disease (HCC) ?  Benign essential HTN ?  Hypothyroidism ?  Memory impairment ?  Restless leg ?  Chronic hyponatremia ?  Tobacco use disorder ?  Pulmonary nodules/lesions, multiple ?  Orthostatic hypotension ?  Heme positive stool ? ? 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   ?ED Course: Amy Areafebrile,She was hypotensive to 84/63 on arrival.  Hgb 9.3 (12.8 in 01/2021).  Hemoccult positive.  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   ?Hospital course: [facility], possible blood loss anemia with positive Hemoccult.  GI consulted-chronic dysphagia likely due to dysmotility barium swallow-limited views no stricture esophageal dysmotility noted being managed conservatively per GI.  On GI follow-up and after discussion with patient's daughter planning to pursue EGD colonoscopy 5/5.  Hemoglobin remains stable. ?Assessment/Plan: ?  ?UGI bleed, acute vs chronic  blood loss anemia: Seen by GI.   EGD 5/5 showed one small AVM, mild gastritis but otherwise was unrevealing for source of anemia. Distal rectal edematous mucosa suspect due to prolapsed tissue but biopsies taken. Per GI, poorly prepped colon and will need another gallon of colon prep today and repeat colonoscopy 5/6 if prep successful. CLD and NPO p MN.  GI d/w Daughter.S/P IV ferric gluconate 250 mg x 2, and home Plavix and aspirin remains on hold. Cont PPI BID.  Stable hemoglobin. Colonoscopy completed 5/6->ascending and Ileocecal valve polypectomy, diverticulosis, rectal ulcer . GI recommended to start soft diet and advance as tolerated,Anusol suppository twice a day for 2 weeks. GI signed off  ?Hypotension: in the setting of #1. Managed with IV fluids, orthostatic hypotension resolved.  Overall clinically stable, holding antihypertensives --Amlodipine and Losartan. ?Dizziness/Near syncope: likely multifactorial with anemia vs Orthostatic vs Medication related vs Vascular occlusive disease. Pt has bilateral carotid artery disease and chronic occlusion of right vertebral artery. IV fluids, holding antihypertensives (Losartan), minimize polypharmacy- she is on oxycodone,gabapentin and Flexeril- discussed risks. PT OT evaluated patient and recommended return home with resumption of home hospice services.  ?Peripheral Vascular disease: Pt has Bilateral carotid artery disease 40-59% stenosis ICA,  ECA more than 50% stenosed.  Left vertebral artery shows presteal syndrome, left subclavian artery stenotic,, rt s/c artery post stenotic, suggestive of more proximal disease-followed by Amy. Randie Heinzain who was planning for outpatient carotid duplex-discussed with on call VVS Amy. Myra GianottiBrabham- highly unlikely that it is causing any symptoms and he advised outpatient follow-up.  Patient reports that she has had carotid blockages 40-59% for last few years.Also S/p remote SMA stenting.Currently asa, plavix held with #1--okay to resume  Plavix per GI,Amy Amy AngelBrahmbhatt, recommended to hold Asa for 1 week ?Chronic  dysphagia likely due to mild dysmotility:Reportedly had an episode of regurgitation.Esophagram concerning for esophageal  dysmotility. EGD findings as above.  ?AMS/ acute encephalopathy: Related to polypharmacy vs hemodynamic instability vs vascular disease. Now resolved, AAOX 3. Per daughter delusional when EMS arrived, patient reports she has been confused forgetful. CT head no acute neurological deficit at this time mentation is stable. Continue PT OT Sedative meds held partially. ?Chronic lower back pain, Advanced DJD facet disease and lumbar spines with leftward scoliosis. Bilateral leg weakness: No new focal deficits.On oxycodone and gabapentin and Flexeril.She is at risk for polypharmacy and this was discussed with patient and patient's daughter, off Lexapro. Flexeril held here.  Minimize polypharmacy and to follow-up with PCP/Hospice MD for further titrations of oxycodone to optimize goals of care.  ?COPD, Chronic respiratory failure with hypoxia on 4 L nasal cannula:Reportedly had wheezing on presentation.  At this time shortness of breath is stable at baseline on 4 L nasal cannula, continue Incruse/Dulera, DuoNeb, supplemental oxygen, smoking cessation was discussed previously. ?Pulmonary nodules/lesions, multiple:Repeat CT without contrast in 3 to 6 months recommended- will provide her with instructions on discharge as patient is considered high risk given lifelong smoking if aligns with goals of care. Tobacco cessation advised.    ?Chronic hyponatremiaSodium stable.   ?Restless leg-Continue home meds. ?Hypothyroidism-Continue Synthroid. ? ? ?D/C plan updated as patient currently on hospice at home with home aide to assist with bathing/dressing. PT/OT continued to follow acutely while here to maximize activity tolerance and global strength to D/C home with hospice support.  ? ?Discharge Exam:  ? ?Vitals:  ? 10/18/21 0835 10/18/21  0915 10/18/21 0920 10/18/21 0930  ?BP: (!) 148/102 (!) 82/52 98/66 131/78  ?Pulse: 92 77 76 79  ?Resp: 11 16 18 15   ?Temp: 97.7 ?F (36.5 ?C) 98.4 ?F (36.9 ?C)    ?TempSrc: Temporal Temporal    ?SpO2: 92% 99% 99% 100%  ?Weight:      ?Height:      ? ? ?General: Pt is alert, awake, not in acute distress. Disoriented at baseline ?Cardiovascular: RRR, S1/S2 +, no rubs, no gallops ?Respiratory: CTA bilaterally, no wheezing, no rhonchi ?Abdominal: Soft, NT, ND, bowel sounds + ?Extremities: no edema, no cyanosis, chronic bilateral LE weakness ? ?Discharge Condition:Stable ?CODE STATUS:  DNR ?Diet recommendation: Heart Healthy, Boost breezier supplements ?Recommendations for Outpatient Follow-up:  ?Follow up with ASA in 1 week. Can stop Anusol suppositories in 2 weeks.  ?Follow up with consultants:  Hospice MD for titration of opiates.  ?Please obtain follow up labs including: CBC in 1 week ? ?Home Health services upon discharge: Resume home hospice ?Equipment/Devices upon discharge: resume chronic O2 4LITS ? ? ?Discharge Instructions: ? ? ?Allergies as of 10/18/2021   ? ?   Reactions  ? Penicillins Anaphylaxis, Swelling  ? ?  ? ?  ?Medication List  ?  ? ?STOP taking these medications   ? ?albuterol 108 (90 Base) MCG/ACT inhaler ?Commonly known as: VENTOLIN HFA ?Replaced by: albuterol (2.5 MG/3ML) 0.083% nebulizer solution ?  ?amLODipine 5 MG tablet ?Commonly known as: NORVASC ?  ?cyclobenzaprine 5 MG tablet ?Commonly known as: FLEXERIL ?  ?ibuprofen 200 MG tablet ?Commonly known as: ADVIL ?  ?losartan 100 MG tablet ?Commonly known as: COZAAR ?  ? ?  ? ?TAKE these medications   ? ?albuterol (2.5 MG/3ML) 0.083% nebulizer solution ?Commonly known as: PROVENTIL ?Take 3 mLs (2.5 mg total) by nebulization every 4 (four) hours as needed for wheezing or shortness of  breath. ?Replaces: albuterol 108 (90 Base) MCG/ACT inhaler ?  ?aspirin EC 81 MG tablet ?Take 1 tablet (81 mg total) by mouth daily. Hold for 1 week. May resume  10/26/21 if remains stable with worsening anemia/GI bleeds ?What changed: additional instructions ?  ?atorvastatin 10 MG tablet ?Commonly known as: LIPITOR ?Take 10 mg by mouth daily. ?  ?Breztri Aerosphere 160-

## 2021-10-18 NOTE — Interval H&P Note (Signed)
History and Physical Interval Note: ? ?10/18/2021 ?8:34 AM ? ?Amy Mckay  has presented today for surgery, with the diagnosis of anemia.  The various methods of treatment have been discussed with the patient and family. After consideration of risks, benefits and other options for treatment, the patient has consented to  Procedure(s): ?COLONOSCOPY WITH PROPOFOL (N/A) as a surgical intervention.  The patient's history has been reviewed, patient examined, no change in status, stable for surgery.  I have reviewed the patient's chart and labs.  Questions were answered to the patient's satisfaction.   ? ?Seen and examined at bedside in the Endo unit.  Completed prep and now having clear bowel movements.  Complaining of suprapubic discomfort which has been there for last few weeks.  On physical exam, she has mild suprapubic and right lower quadrant discomfort on palpation.  Bowel sounds are present. ? ?Risks (bleeding, infection, bowel perforation that could require surgery, sedation-related changes in cardiopulmonary systems), benefits (identification and possible treatment of source of symptoms, exclusion of certain causes of symptoms), and alternatives (watchful waiting, radiographic imaging studies, empiric medical treatment)  were explained to patient/family in detail and patient wishes to proceed.  ? ? ?Amy Mckay ? ? ?

## 2021-10-18 NOTE — Anesthesia Preprocedure Evaluation (Addendum)
Anesthesia Evaluation  ?Patient identified by MRN, date of birth, ID band ?Patient awake ? ? ? ?Reviewed: ?Allergy & Precautions, NPO status , Patient's Chart, lab work & pertinent test results ? ?History of Anesthesia Complications ?Negative for: history of anesthetic complications ? ?Airway ?Mallampati: II ? ?TM Distance: >3 FB ?Neck ROM: Full ? ? ? Dental ? ?(+) Edentulous Upper, Missing,  ?  ?Pulmonary ?COPD,  oxygen dependent, Current Smoker and Patient abstained from smoking.,  ?  ?Pulmonary exam normal ? ? ? ? ? ? ? Cardiovascular ?hypertension, + Peripheral Vascular Disease  ?Normal cardiovascular exam ? ? ?  ?Neuro/Psych ?negative neurological ROS ? negative psych ROS  ? GI/Hepatic ?Neg liver ROS, GERD  ,  ?Endo/Other  ?Hypothyroidism  ? Renal/GU ?  ?negative genitourinary ?  ?Musculoskeletal ?negative musculoskeletal ROS ?(+)  ? Abdominal ?  ?Peds ? Hematology ? ?(+) Blood dyscrasia (Hgb 9.6), anemia ,   ?Anesthesia Other Findings ?Day of surgery medications reviewed with patient. ? Reproductive/Obstetrics ?negative OB ROS ? ?  ? ? ? ? ? ? ? ? ? ? ? ? ? ?  ?  ? ? ? ? ? ? ? ?Anesthesia Physical ?Anesthesia Plan ? ?ASA: 4 ? ?Anesthesia Plan: MAC  ? ?Post-op Pain Management: Minimal or no pain anticipated  ? ?Induction:  ? ?PONV Risk Score and Plan: Treatment may vary due to age or medical condition and Propofol infusion ? ?Airway Management Planned: Natural Airway and Simple Face Mask ? ?Additional Equipment: None ? ?Intra-op Plan:  ? ?Post-operative Plan:  ? ?Informed Consent: I have reviewed the patients History and Physical, chart, labs and discussed the procedure including the risks, benefits and alternatives for the proposed anesthesia with the patient or authorized representative who has indicated his/her understanding and acceptance.  ? ?Patient has DNR.  ?Discussed DNR with patient and Suspend DNR. ?  ? ? ?Plan Discussed with: CRNA ? ?Anesthesia Plan Comments: (DNR  discussed with patient and she would like to be FULL CODE periprocedurally. Daiva Huge, MD)  ? ? ? ? ? ?Anesthesia Quick Evaluation ? ?

## 2021-10-19 ENCOUNTER — Encounter (HOSPITAL_COMMUNITY): Payer: Self-pay | Admitting: Gastroenterology

## 2021-10-20 LAB — SURGICAL PATHOLOGY

## 2021-10-21 LAB — SURGICAL PATHOLOGY

## 2022-09-10 ENCOUNTER — Emergency Department (HOSPITAL_COMMUNITY)
Admission: EM | Admit: 2022-09-10 | Discharge: 2022-09-10 | Disposition: A | Discharge status: Home / Self Care | Payer: 59 | Attending: Emergency Medicine | Admitting: Emergency Medicine

## 2022-09-10 ENCOUNTER — Other Ambulatory Visit: Payer: Self-pay

## 2022-09-10 ENCOUNTER — Encounter (HOSPITAL_COMMUNITY): Payer: Self-pay

## 2022-09-10 ENCOUNTER — Emergency Department (HOSPITAL_COMMUNITY): Payer: 59

## 2022-09-10 DIAGNOSIS — J449 Chronic obstructive pulmonary disease, unspecified: Secondary | ICD-10-CM | POA: Insufficient documentation

## 2022-09-10 DIAGNOSIS — I10 Essential (primary) hypertension: Secondary | ICD-10-CM | POA: Diagnosis not present

## 2022-09-10 DIAGNOSIS — Z7982 Long term (current) use of aspirin: Secondary | ICD-10-CM | POA: Diagnosis not present

## 2022-09-10 DIAGNOSIS — Z79899 Other long term (current) drug therapy: Secondary | ICD-10-CM | POA: Insufficient documentation

## 2022-09-10 DIAGNOSIS — F0390 Unspecified dementia without behavioral disturbance: Secondary | ICD-10-CM | POA: Insufficient documentation

## 2022-09-10 DIAGNOSIS — M48061 Spinal stenosis, lumbar region without neurogenic claudication: Secondary | ICD-10-CM | POA: Diagnosis not present

## 2022-09-10 DIAGNOSIS — K649 Unspecified hemorrhoids: Secondary | ICD-10-CM | POA: Insufficient documentation

## 2022-09-10 DIAGNOSIS — R1084 Generalized abdominal pain: Secondary | ICD-10-CM | POA: Diagnosis present

## 2022-09-10 DIAGNOSIS — K6289 Other specified diseases of anus and rectum: Secondary | ICD-10-CM

## 2022-09-10 DIAGNOSIS — K5120 Ulcerative (chronic) proctitis without complications: Secondary | ICD-10-CM | POA: Diagnosis not present

## 2022-09-10 LAB — COMPREHENSIVE METABOLIC PANEL
ALT: 16 U/L (ref 0–44)
AST: 21 U/L (ref 15–41)
Albumin: 3.9 g/dL (ref 3.5–5.0)
Alkaline Phosphatase: 78 U/L (ref 38–126)
Anion gap: 9 (ref 5–15)
BUN: 26 mg/dL — ABNORMAL HIGH (ref 8–23)
CO2: 31 mmol/L (ref 22–32)
Calcium: 8.7 mg/dL — ABNORMAL LOW (ref 8.9–10.3)
Chloride: 92 mmol/L — ABNORMAL LOW (ref 98–111)
Creatinine, Ser: 0.85 mg/dL (ref 0.44–1.00)
GFR, Estimated: >60 mL/min (ref >60)
Glucose, Bld: 94 mg/dL (ref 70–99)
Potassium: 4 mmol/L (ref 3.5–5.1)
Sodium: 132 mmol/L — ABNORMAL LOW (ref 135–145)
Total Bilirubin: 0.3 mg/dL (ref 0.3–1.2)
Total Protein: 7.6 g/dL (ref 6.5–8.1)

## 2022-09-10 LAB — CBC WITH DIFFERENTIAL/PLATELET
Abs Immature Granulocytes: 0.05 10*3/uL (ref 0.00–0.07)
Basophils Absolute: 0 10*3/uL (ref 0.0–0.1)
Basophils Relative: 0 %
Eosinophils Absolute: 0.2 10*3/uL (ref 0.0–0.5)
Eosinophils Relative: 2 %
HCT: 30.2 % — ABNORMAL LOW (ref 36.0–46.0)
Hemoglobin: 9.2 g/dL — ABNORMAL LOW (ref 12.0–15.0)
Immature Granulocytes: 1 %
Lymphocytes Relative: 19 %
Lymphs Abs: 2 10*3/uL (ref 0.7–4.0)
MCH: 24.8 pg — ABNORMAL LOW (ref 26.0–34.0)
MCHC: 30.5 g/dL (ref 30.0–36.0)
MCV: 81.4 fL (ref 80.0–100.0)
Monocytes Absolute: 1.1 10*3/uL — ABNORMAL HIGH (ref 0.1–1.0)
Monocytes Relative: 10 %
Neutro Abs: 7.5 10*3/uL (ref 1.7–7.7)
Neutrophils Relative %: 68 %
Platelets: 355 10*3/uL (ref 150–400)
RBC: 3.71 MIL/uL — ABNORMAL LOW (ref 3.87–5.11)
RDW: 15.1 % (ref 11.5–15.5)
WBC: 10.9 10*3/uL — ABNORMAL HIGH (ref 4.0–10.5)
nRBC: 0 % (ref 0.0–0.2)

## 2022-09-10 LAB — URINALYSIS, ROUTINE W REFLEX MICROSCOPIC
Bacteria, UA: NONE SEEN
Bilirubin Urine: NEGATIVE
Glucose, UA: NEGATIVE mg/dL
Hgb urine dipstick: NEGATIVE
Ketones, ur: NEGATIVE mg/dL
Nitrite: NEGATIVE
Protein, ur: NEGATIVE mg/dL
Specific Gravity, Urine: 1.013 (ref 1.005–1.030)
pH: 7 (ref 5.0–8.0)

## 2022-09-10 LAB — POC OCCULT BLOOD, ED: Fecal Occult Bld: NEGATIVE

## 2022-09-10 LAB — LIPASE, BLOOD: Lipase: 71 U/L — ABNORMAL HIGH (ref 11–51)

## 2022-09-10 MED ORDER — IOHEXOL 300 MG/ML  SOLN
80.0000 mL | Freq: Once | INTRAMUSCULAR | Status: AC | PRN
  Administered 2022-09-10: 80 mL via INTRAVENOUS

## 2022-09-10 MED ORDER — METRONIDAZOLE 500 MG PO TABS: 500.0000 mg | ORAL_TABLET | Freq: Two times a day (BID) | ORAL | 0 refills | Status: DC

## 2022-09-10 MED ORDER — CEFDINIR 300 MG PO CAPS: 300.0000 mg | ORAL_CAPSULE | Freq: Two times a day (BID) | ORAL | 0 refills | Status: DC

## 2022-09-10 MED ORDER — MORPHINE SULFATE (PF) 4 MG/ML IV SOLN
4.0000 mg | Freq: Once | INTRAVENOUS | Status: AC
  Administered 2022-09-10: 4 mg via INTRAVENOUS
  Filled 2022-09-10: qty 1

## 2022-09-10 MED ORDER — CIPROFLOXACIN HCL 500 MG PO TABS: 500.0000 mg | ORAL_TABLET | Freq: Two times a day (BID) | ORAL | 0 refills | Status: DC

## 2022-09-10 MED ORDER — ONDANSETRON HCL 4 MG/2ML IJ SOLN
4.0000 mg | Freq: Once | INTRAMUSCULAR | Status: AC
  Administered 2022-09-10: 4 mg via INTRAVENOUS
  Filled 2022-09-10: qty 2

## 2022-09-10 MED ORDER — HYDROCORTISONE (PERIANAL) 2.5 % EX CREA: 1.0000 | TOPICAL_CREAM | Freq: Two times a day (BID) | CUTANEOUS | 0 refills | Status: DC

## 2022-09-10 NOTE — ED Triage Notes (Signed)
Patient has been having jelly colored stools like mucus with blood. Had an intestinal infection 3 weeks ago, was diagnosed with diverticulitis. Left lower quadrant pain.

## 2022-09-10 NOTE — ED Notes (Signed)
Pt to ct via stretcher

## 2022-09-10 NOTE — ED Notes (Signed)
Pt requesting pain medications prior to CT scan. Dr. Gilford Raid notified

## 2022-09-10 NOTE — ED Provider Notes (Signed)
[place] EMERGENCY DEPARTMENT AT [facility] Provider Note   CSN: [id number] Arrival date & time: 09/10/22  1521     History  Chief Complaint  Patient presents with   Abdominal Pain    Amy Mckay is a 88 y.o. female.  Pt is an 88 yo female with pmhx significant for HTN, GERD, and dementia, chronic pain and COPD on 2L.  Pt is home bound and has been having home visits from her pcp.  Pt has been complaining of slimy fecal matter that leaks into her depends.  She has had some rectal bleeding and problems with hemorrhoids.  PCP put her on flagyl for diverticulitis and referred to GI.  Pt has been having increasing pain in her abd and in her legs.  She has had increased fecal d/c and increased rectal bleeding.       Home Medications Prior to Admission medications   Medication Sig Start Date End Date Taking? Authorizing Provider  ciprofloxacin (CIPRO) 500 MG tablet Take 1 tablet (500 mg total) by mouth 2 (two) times daily. 09/10/22  Yes Isla Pence, MD  hydrocortisone (ANUSOL-HC) 2.5 % rectal cream Place 1 Application rectally 2 (two) times daily. 09/10/22  Yes Isla Pence, MD  metroNIDAZOLE (FLAGYL) 500 MG tablet Take 1 tablet (500 mg total) by mouth 2 (two) times daily. 09/10/22  Yes Isla Pence, MD  albuterol (PROVENTIL) (2.5 MG/3ML) 0.083% nebulizer solution Take 3 mLs (2.5 mg total) by nebulization every 4 (four) hours as needed for wheezing or shortness of breath. 10/18/21 10/18/22  Guilford Shi, MD  aspirin EC 81 MG tablet Take 1 tablet (81 mg total) by mouth daily. Hold for 1 week. May resume 10/26/21 if remains stable with worsening anemia/GI bleeds 10/18/21   Guilford Shi, MD  atorvastatin (LIPITOR) 10 MG tablet Take 10 mg by mouth daily.    [provider]  BREZTRI AEROSPHERE 160-9-4.8 MCG/ACT AERO Inhale 2 puffs into the lungs daily.    [provider]  Calcium Carb-Cholecalciferol (CALCIUM + VITAMIN D3 PO) Take 1 tablet by mouth  daily.    [provider]  clopidogrel (PLAVIX) 75 MG tablet Take 75 mg by mouth daily. 06/04/16   [provider]  esomeprazole (NEXIUM) 40 MG capsule Take 40 mg by mouth daily in the afternoon. 11/01/20   [provider]  gabapentin (NEURONTIN) 800 MG tablet Take 800 mg by mouth 3 (three) times daily. 04/25/16   [provider]  hydrocortisone (ANUSOL-HC) 25 MG suppository Place 1 suppository (25 mg total) rectally 2 (two) times daily. 10/18/21   Guilford Shi, MD  levothyroxine (SYNTHROID, LEVOTHROID) 75 MCG tablet Take 75 mcg by mouth daily before breakfast. 02/05/16   [provider]  memantine (NAMENDA) 10 MG tablet Take 10 mg by mouth 2 (two) times daily.    [provider]  Multiple Vitamin (MULTIVITAMIN WITH MINERALS) TABS tablet Take 1 tablet by mouth daily with breakfast.    [provider]  nicotine polacrilex (NICORETTE) 2 MG gum Take 1 each (2 mg total) by mouth as needed for smoking cessation. 10/18/21   Guilford Shi, MD  Oxycodone HCl 10 MG TABS Take 10 mg by mouth 4 (four) times daily.    [provider]  pantoprazole (PROTONIX) 40 MG tablet Take 1 tablet (40 mg total) by mouth 2 (two) times daily. 10/18/21 10/18/22  Guilford Shi, MD  pramipexole (MIRAPEX) 0.25 MG tablet Take 0.25 mg by mouth at bedtime. 01/02/21   [provider]  vitamin C (ASCORBIC ACID) 500 MG tablet Take 500 mg by mouth daily.    [provider]      Allergies    Penicillins    Review of Systems   Review of Systems  Gastrointestinal:  Positive for abdominal pain and blood in stool.  Musculoskeletal:        Bilateral leg pain  All other systems reviewed and are negative.   Physical Exam Updated Vital Signs BP 121/84   Pulse (!) 112   Temp 99 F (37.2 C) (Oral)   Resp 12   Ht 5\' 1"  (1.549 m)   Wt 55 kg   SpO2 100%   BMI 22.91 kg/m  Physical Exam Vitals and nursing note reviewed. Exam conducted with a  chaperone present.  Constitutional:      Appearance: She is well-developed.  HENT:     Head: Normocephalic and atraumatic.     Mouth/Throat:     Mouth: Mucous membranes are moist.     Pharynx: Oropharynx is clear.  Eyes:     Extraocular Movements: Extraocular movements intact.     Pupils: Pupils are equal, round, and reactive to light.  Cardiovascular:     Rate and Rhythm: Normal rate and regular rhythm.     Heart sounds: Normal heart sounds.  Pulmonary:     Effort: Pulmonary effort is normal.     Breath sounds: Normal breath sounds.  Abdominal:     General: Abdomen is flat. Bowel sounds are normal.     Palpations: Abdomen is soft.     Tenderness: There is generalized abdominal tenderness.  Genitourinary:    Rectum: Guaiac result negative. External hemorrhoid present.     Comments: Hemorrhoid is not thrombosed  Skin:    General: Skin is warm.     Capillary Refill: Capillary refill takes less than 2 seconds.  Neurological:     General: No focal deficit present.     Mental Status: She is alert and oriented to person, place, and time.  Psychiatric:        Mood and Affect: Mood normal.        Behavior: Behavior normal.     ED Results / Procedures / Treatments   Labs (all labs ordered are listed, but only abnormal results are displayed) Labs Reviewed  CBC WITH DIFFERENTIAL/PLATELET - Abnormal; Notable for the following components:      Result Value   WBC 10.9 (*)    RBC 3.71 (*)    Hemoglobin 9.2 (*)    HCT 30.2 (*)    MCH 24.8 (*)    Monocytes Absolute 1.1 (*)    All other components within normal limits  COMPREHENSIVE METABOLIC PANEL - Abnormal; Notable for the following components:   Sodium 132 (*)    Chloride 92 (*)    BUN 26 (*)    Calcium 8.7 (*)    All other components within normal limits  LIPASE, BLOOD - Abnormal; Notable for the following components:   Lipase 71 (*)    All other components within normal limits  URINALYSIS, ROUTINE W REFLEX MICROSCOPIC -  Abnormal; Notable for the following components:   Color, Urine COLORLESS (*)    Leukocytes,Ua TRACE (*)    All other components within normal limits  POC OCCULT BLOOD, ED    EKG EKG Interpretation  Date/Time:  Thursday September 10 2022 16:15:14 EDT Ventricular Rate:  101 PR Interval:  170 QRS Duration: 83 QT Interval:  346 QTC Calculation: 449 R Axis:  86 Text Interpretation: Sinus tachycardia Biatrial enlargement Anteroseptal infarct, age indeterminate Since last tracing rate faster Confirmed by Isla Pence [phone]) on 09/10/2022 4:31:43 PM  Radiology CT ABDOMEN PELVIS W CONTRAST  Result Date: 09/10/2022 CLINICAL DATA:  Abdominal pain, jelly colored stools like mucus containing blood, had intestinal infection 3 weeks ago, diagnosed with diverticulitis, having LEFT lower quadrant and lower back pain EXAM: CT ABDOMEN AND PELVIS WITH CONTRAST TECHNIQUE: Multidetector CT imaging of the abdomen and pelvis was performed using the standard protocol following bolus administration of intravenous contrast. RADIATION DOSE REDUCTION: This exam was performed according to the departmental dose-optimization program which includes automated exposure control, adjustment of the mA and/or kV according to patient size and/or use of iterative reconstruction technique. CONTRAST:  64mL OMNIPAQUE IOHEXOL 300 MG/ML SOLN IV. No oral contrast. COMPARISON:  10/13/2021 FINDINGS: Lower chest: Minimal atelectasis or scarring at RIGHT base. Hepatobiliary: Gallbladder contracted, wall mildly thickened but may be artifact related to collapsed state. No hepatic mass or nodularity. Minimal intrahepatic and extrahepatic biliary dilatation which may be physiologic based on patient age, recommend correlation with LFTs. Pancreas: Pancreatic atrophy without mass. Spleen: Normal appearance Adrenals/Urinary Tract: BILATERAL adrenal thickening without mass. Renal cortical atrophy, mild. No renal mass, hydronephrosis, hydroureter, or  ureteral calcification. Tiny calcification identified adjacent to the LEFT bladder base appears unchanged. Stomach/Bowel: Rectum incompletely distended, unable to exclude rectal wall thickening. Scattered stool throughout colon. Stomach unremarkable. Remaining bowel loops normal appearance though several small bowel loops are located very low in the pelvis adjacent to the rectum, posterior to the urinary bladder. Vascular/Lymphatic: Extensive atherosclerotic calcifications aorta common iliac arteries, coronary arteries, visceral arteries. No aortic aneurysm. No adenopathy. Reproductive: Uterus surgically absent.  Ovaries not visualized. Other: No free air or free fluid. No hernia or definite inflammatory process. Musculoskeletal: Multilevel degenerative disc and facet disease changes lumbar spine with scoliosis. IMPRESSION: Unable to exclude rectal wall thickening; recommend correlation with digital rectal exam and proctoscopy. Mild biliary dilatation question related to age, recommend correlation with LFTs. No additional acute intra-abdominal or intrapelvic abnormalities. Aortic Atherosclerosis (ICD10-I70.0). Electronically Signed   By: Lavonia Dana M.D.   On: 09/10/2022 18:41   CT L-SPINE NO CHARGE  Result Date: 09/10/2022 CLINICAL DATA:  Patient has been having jelly colored stools like mucus with blood. Had an intestinal infection 3 weeks ago, was diagnosed with diverticulitis. Left lower quadrant pain. Lower back pain. EXAM: CT LUMBAR SPINE WITHOUT CONTRAST TECHNIQUE: Multidetector CT imaging of the lumbar spine was performed without intravenous contrast administration. Multiplanar CT image reconstructions were also generated. RADIATION DOSE REDUCTION: This exam was performed according to the departmental dose-optimization program which includes automated exposure control, adjustment of the mA and/or kV according to patient size and/or use of iterative reconstruction technique. COMPARISON:  CT lumbar spine  06/14/2016, CT abdomen pelvis 01/29/2021 FINDINGS: Segmentation: 5 lumbar type vertebrae. Alignment: The dextrocurvature of the lower lumbar spine centered at the L4 level with compensatory levoscoliosis centered at the L2-L3 level. Stable mild retrolisthesis of L2 on L3 and L3 on L4. Vertebrae: Multilevel severe degenerative changes of the spine with L1-L2 and L2-L3 endplate sclerosis. Associated severe osseous neural foraminal stenosis at the bilateral L2-L3 level. No severe osseous central canal stenosis. No acute fracture or focal pathologic process. Paraspinal and other soft tissues: Negative. Disc levels: Multilevel intervertebral disc space vacuum phenomenon. IMPRESSION: 1. No acute displaced fracture or traumatic listhesis of the lumbar spine. 2. Scoliosis, retrolisthesis, degenerative changes leading to severe osseous neural foraminal stenosis at  the bilateral L2-L3 level. 3. Please see separately dictated CT abdomen pelvis 09/10/2022. Aortic Atherosclerosis (ICD10-I70.0). Electronically Signed   By: Iven Finn M.D.   On: 09/10/2022 18:28    Procedures Procedures    Medications Ordered in ED Medications  morphine (PF) 4 MG/ML injection 4 mg (4 mg Intravenous Given 09/10/22 1814)  ondansetron (ZOFRAN) injection 4 mg (4 mg Intravenous Given 09/10/22 1814)  iohexol (OMNIPAQUE) 300 MG/ML solution 80 mL (80 mLs Intravenous Contrast Given 09/10/22 1755)    ED Course/ Medical Decision Making/ A&P                             Medical Decision Making Amount and/or Complexity of Data Reviewed Labs: ordered. Radiology: ordered.  Risk Prescription drug management.   This patient presents to the ED for concern of abd pain, this involves an extensive number of treatment options, and is a complaint that carries with it a high risk of complications and morbidity.  The differential diagnosis includes diverticulitis, abscess, colitis, diverticulosis   Co morbidities that complicate the patient  evaluation  HTN, GERD, and dementia, chronic pain and COPD on 2L   Additional history obtained:  Additional history obtained from epic chart review External records from outside source obtained and reviewed including granddaughter   Lab Tests:  I Ordered, and personally interpreted labs.  The pertinent results include:  cbc with wbc 10.9 and hgb 9.2 (hgb 9.6 in May of 2023), cmp with na 132 and bun 26, lip elevated at 71   Imaging Studies ordered:  I ordered imaging studies including ct abd/pelvis, ct lumbar  I independently visualized and interpreted imaging which showed  CT abd/pelvis:  Unable to exclude rectal wall thickening; recommend correlation with  digital rectal exam and proctoscopy.    Mild biliary dilatation question related to age, recommend  correlation with LFTs.    No additional acute intra-abdominal or intrapelvic abnormalities.    Aortic Atherosclerosis (ICD10-I70.0).  Lumbar CT:  No acute displaced fracture or traumatic listhesis of the lumbar  spine.  2. Scoliosis, retrolisthesis, degenerative changes leading to severe  osseous neural foraminal stenosis at the bilateral L2-L3 level.   I agree with the radiologist interpretation   Cardiac Monitoring:  The patient was maintained on a cardiac monitor.  I personally viewed and interpreted the cardiac monitored which showed an underlying rhythm of: nsr   Medicines ordered and prescription drug management:  I ordered medication including morphine  for pain  Reevaluation of the patient after these medicines showed that the patient improved I have reviewed the patients home medicines and have made adjustments as needed   Test Considered:  ct   Problem List / ED Course:  Rectal discharge:  ? Rectal thickening on ct.  I will put pt on cipro/flagyl and anusol. She has an appointment with GI.  She knows to keep that appt. Leg pain:  pt has spinal stenosis and ddd on ct.  This looks chronic.  Pt is  on gabapentin and oxy 10s chronically.  She is to continue this.  She may need PT if it worsens.  She is not a good surgical candidate.   Reevaluation:  After the interventions noted above, I reevaluated the patient and found that they have :improved   Social Determinants of Health:  Lives at home   Dispostion:  After consideration of the diagnostic results and the patients response to treatment, I feel that the patent would  benefit from discharge with outpatient f/u.          Final Clinical Impression(s) / ED Diagnoses Final diagnoses:  Spinal stenosis of lumbar region without neurogenic claudication  Proctitis  Hemorrhoids, unspecified hemorrhoid type    Rx / DC Orders ED Discharge Orders          Ordered    hydrocortisone (ANUSOL-HC) 2.5 % rectal cream  2 times daily        09/10/22 1900    cefdinir (OMNICEF) 300 MG capsule  2 times daily,   Status:  Discontinued        09/10/22 1903    metroNIDAZOLE (FLAGYL) 500 MG tablet  2 times daily        09/10/22 1903    ciprofloxacin (CIPRO) 500 MG tablet  2 times daily        09/10/22 1904              Isla Pence, MD 09/10/22 2142

## 2022-09-10 NOTE — Discharge Instructions (Addendum)
Keep your appointment with your GI doctor.

## 2022-09-10 NOTE — ED Notes (Signed)
Patient verbalizes understanding of discharge instructions. Opportunity for questioning and answers were provided. Armband removed by staff, pt discharged from ED. Wheeled out to lobby with daughter  

## 2022-09-28 ENCOUNTER — Other Ambulatory Visit: Payer: Self-pay | Admitting: Gastroenterology

## 2022-09-28 DIAGNOSIS — K6289 Other specified diseases of anus and rectum: Secondary | ICD-10-CM

## 2022-11-03 ENCOUNTER — Emergency Department (HOSPITAL_COMMUNITY): Payer: 59

## 2022-11-03 ENCOUNTER — Encounter (HOSPITAL_COMMUNITY): Payer: Self-pay

## 2022-11-03 ENCOUNTER — Other Ambulatory Visit: Payer: Self-pay

## 2022-11-03 ENCOUNTER — Inpatient Hospital Stay: Admission: RE | Admit: 2022-11-03 | Payer: 59 | Source: Ambulatory Visit

## 2022-11-03 ENCOUNTER — Inpatient Hospital Stay (HOSPITAL_COMMUNITY)
Admission: EM | Admit: 2022-11-03 | Discharge: 2022-11-12 | DRG: 065 | Disposition: A | Discharge status: Skilled Nursing Facility | Payer: 59 | Attending: Internal Medicine | Admitting: Internal Medicine

## 2022-11-03 DIAGNOSIS — G8191 Hemiplegia, unspecified affecting right dominant side: Secondary | ICD-10-CM | POA: Diagnosis present

## 2022-11-03 DIAGNOSIS — I639 Cerebral infarction, unspecified: Principal | ICD-10-CM

## 2022-11-03 DIAGNOSIS — I6300 Cerebral infarction due to thrombosis of unspecified precerebral artery: Secondary | ICD-10-CM | POA: Diagnosis not present

## 2022-11-03 DIAGNOSIS — G8929 Other chronic pain: Secondary | ICD-10-CM | POA: Diagnosis not present

## 2022-11-03 DIAGNOSIS — K623 Rectal prolapse: Secondary | ICD-10-CM | POA: Diagnosis present

## 2022-11-03 DIAGNOSIS — D75839 Thrombocytosis, unspecified: Secondary | ICD-10-CM | POA: Diagnosis present

## 2022-11-03 DIAGNOSIS — K219 Gastro-esophageal reflux disease without esophagitis: Secondary | ICD-10-CM | POA: Diagnosis present

## 2022-11-03 DIAGNOSIS — E785 Hyperlipidemia, unspecified: Secondary | ICD-10-CM | POA: Diagnosis present

## 2022-11-03 DIAGNOSIS — I959 Hypotension, unspecified: Secondary | ICD-10-CM | POA: Diagnosis not present

## 2022-11-03 DIAGNOSIS — M549 Dorsalgia, unspecified: Secondary | ICD-10-CM | POA: Diagnosis present

## 2022-11-03 DIAGNOSIS — R29701 NIHSS score 1: Secondary | ICD-10-CM | POA: Diagnosis present

## 2022-11-03 DIAGNOSIS — Z1152 Encounter for screening for COVID-19: Secondary | ICD-10-CM | POA: Diagnosis not present

## 2022-11-03 DIAGNOSIS — Z79899 Other long term (current) drug therapy: Secondary | ICD-10-CM

## 2022-11-03 DIAGNOSIS — Z66 Do not resuscitate: Secondary | ICD-10-CM | POA: Diagnosis present

## 2022-11-03 DIAGNOSIS — I10 Essential (primary) hypertension: Secondary | ICD-10-CM | POA: Diagnosis present

## 2022-11-03 DIAGNOSIS — R200 Anesthesia of skin: Secondary | ICD-10-CM

## 2022-11-03 DIAGNOSIS — D500 Iron deficiency anemia secondary to blood loss (chronic): Secondary | ICD-10-CM | POA: Diagnosis present

## 2022-11-03 DIAGNOSIS — R531 Weakness: Secondary | ICD-10-CM | POA: Diagnosis not present

## 2022-11-03 DIAGNOSIS — M79662 Pain in left lower leg: Secondary | ICD-10-CM | POA: Diagnosis not present

## 2022-11-03 DIAGNOSIS — I6381 Other cerebral infarction due to occlusion or stenosis of small artery: Secondary | ICD-10-CM | POA: Diagnosis present

## 2022-11-03 DIAGNOSIS — Z8673 Personal history of transient ischemic attack (TIA), and cerebral infarction without residual deficits: Secondary | ICD-10-CM | POA: Diagnosis present

## 2022-11-03 DIAGNOSIS — J439 Emphysema, unspecified: Secondary | ICD-10-CM | POA: Diagnosis present

## 2022-11-03 DIAGNOSIS — K5900 Constipation, unspecified: Secondary | ICD-10-CM | POA: Diagnosis present

## 2022-11-03 DIAGNOSIS — I6502 Occlusion and stenosis of left vertebral artery: Secondary | ICD-10-CM | POA: Diagnosis present

## 2022-11-03 DIAGNOSIS — K5909 Other constipation: Secondary | ICD-10-CM | POA: Diagnosis present

## 2022-11-03 DIAGNOSIS — R7303 Prediabetes: Secondary | ICD-10-CM | POA: Diagnosis present

## 2022-11-03 DIAGNOSIS — Z880 Allergy status to penicillin: Secondary | ICD-10-CM

## 2022-11-03 DIAGNOSIS — F17210 Nicotine dependence, cigarettes, uncomplicated: Secondary | ICD-10-CM | POA: Diagnosis present

## 2022-11-03 DIAGNOSIS — I700 Atherosclerosis of aorta: Secondary | ICD-10-CM | POA: Diagnosis present

## 2022-11-03 DIAGNOSIS — I6621 Occlusion and stenosis of right posterior cerebral artery: Secondary | ICD-10-CM | POA: Diagnosis present

## 2022-11-03 DIAGNOSIS — F0390 Unspecified dementia without behavioral disturbance: Secondary | ICD-10-CM | POA: Diagnosis present

## 2022-11-03 DIAGNOSIS — E876 Hypokalemia: Secondary | ICD-10-CM | POA: Diagnosis present

## 2022-11-03 DIAGNOSIS — E039 Hypothyroidism, unspecified: Secondary | ICD-10-CM | POA: Diagnosis present

## 2022-11-03 DIAGNOSIS — I6522 Occlusion and stenosis of left carotid artery: Secondary | ICD-10-CM | POA: Diagnosis present

## 2022-11-03 DIAGNOSIS — D649 Anemia, unspecified: Secondary | ICD-10-CM | POA: Diagnosis not present

## 2022-11-03 DIAGNOSIS — D72829 Elevated white blood cell count, unspecified: Secondary | ICD-10-CM | POA: Diagnosis present

## 2022-11-03 DIAGNOSIS — Z7982 Long term (current) use of aspirin: Secondary | ICD-10-CM

## 2022-11-03 DIAGNOSIS — D509 Iron deficiency anemia, unspecified: Secondary | ICD-10-CM | POA: Diagnosis not present

## 2022-11-03 DIAGNOSIS — Z79890 Hormone replacement therapy (postmenopausal): Secondary | ICD-10-CM | POA: Diagnosis not present

## 2022-11-03 DIAGNOSIS — G894 Chronic pain syndrome: Secondary | ICD-10-CM | POA: Diagnosis present

## 2022-11-03 DIAGNOSIS — N8110 Cystocele, unspecified: Secondary | ICD-10-CM | POA: Diagnosis present

## 2022-11-03 DIAGNOSIS — I6389 Other cerebral infarction: Secondary | ICD-10-CM | POA: Diagnosis not present

## 2022-11-03 DIAGNOSIS — I739 Peripheral vascular disease, unspecified: Secondary | ICD-10-CM | POA: Diagnosis present

## 2022-11-03 DIAGNOSIS — M545 Low back pain, unspecified: Secondary | ICD-10-CM | POA: Diagnosis not present

## 2022-11-03 LAB — CBC
HCT: 31.7 % — ABNORMAL LOW (ref 36.0–46.0)
Hemoglobin: 9.5 g/dL — ABNORMAL LOW (ref 12.0–15.0)
MCH: 23.3 pg — ABNORMAL LOW (ref 26.0–34.0)
MCHC: 30 g/dL (ref 30.0–36.0)
MCV: 77.9 fL — ABNORMAL LOW (ref 80.0–100.0)
Platelets: 460 10*3/uL — ABNORMAL HIGH (ref 150–400)
RBC: 4.07 MIL/uL (ref 3.87–5.11)
RDW: 15.5 % (ref 11.5–15.5)
WBC: 10.9 10*3/uL — ABNORMAL HIGH (ref 4.0–10.5)
nRBC: 0 % (ref 0.0–0.2)

## 2022-11-03 LAB — COMPREHENSIVE METABOLIC PANEL
ALT: 18 U/L (ref 0–44)
AST: 22 U/L (ref 15–41)
Albumin: 3.8 g/dL (ref 3.5–5.0)
Alkaline Phosphatase: 86 U/L (ref 38–126)
Anion gap: 8 (ref 5–15)
BUN: 19 mg/dL (ref 8–23)
CO2: 30 mmol/L (ref 22–32)
Calcium: 9 mg/dL (ref 8.9–10.3)
Chloride: 96 mmol/L — ABNORMAL LOW (ref 98–111)
Creatinine, Ser: 0.85 mg/dL (ref 0.44–1.00)
GFR, Estimated: >60 mL/min (ref >60)
Glucose, Bld: 101 mg/dL — ABNORMAL HIGH (ref 70–99)
Potassium: 4.2 mmol/L (ref 3.5–5.1)
Sodium: 134 mmol/L — ABNORMAL LOW (ref 135–145)
Total Bilirubin: 0.5 mg/dL (ref 0.3–1.2)
Total Protein: 7.7 g/dL (ref 6.5–8.1)

## 2022-11-03 LAB — DIFFERENTIAL
Abs Immature Granulocytes: 0.05 10*3/uL (ref 0.00–0.07)
Basophils Absolute: 0 10*3/uL (ref 0.0–0.1)
Basophils Relative: 0 %
Eosinophils Absolute: 0.4 10*3/uL (ref 0.0–0.5)
Eosinophils Relative: 4 %
Immature Granulocytes: 1 %
Lymphocytes Relative: 24 %
Lymphs Abs: 2.6 10*3/uL (ref 0.7–4.0)
Monocytes Absolute: 1.1 10*3/uL — ABNORMAL HIGH (ref 0.1–1.0)
Monocytes Relative: 10 %
Neutro Abs: 6.7 10*3/uL (ref 1.7–7.7)
Neutrophils Relative %: 61 %

## 2022-11-03 LAB — URINALYSIS, ROUTINE W REFLEX MICROSCOPIC
Bacteria, UA: NONE SEEN
Bilirubin Urine: NEGATIVE
Glucose, UA: NEGATIVE mg/dL
Ketones, ur: NEGATIVE mg/dL
Nitrite: NEGATIVE
Protein, ur: NEGATIVE mg/dL
Specific Gravity, Urine: 1.003 — ABNORMAL LOW (ref 1.005–1.030)
pH: 7 (ref 5.0–8.0)

## 2022-11-03 LAB — RAPID URINE DRUG SCREEN, HOSP PERFORMED
Amphetamines: NOT DETECTED
Barbiturates: NOT DETECTED
Benzodiazepines: NOT DETECTED
Cocaine: NOT DETECTED
Opiates: NOT DETECTED
Tetrahydrocannabinol: NOT DETECTED

## 2022-11-03 LAB — I-STAT CHEM 8, ED
BUN: 20 mg/dL (ref 8–23)
Calcium, Ion: 1.19 mmol/L (ref 1.15–1.40)
Chloride: 94 mmol/L — ABNORMAL LOW (ref 98–111)
Creatinine, Ser: 0.9 mg/dL (ref 0.44–1.00)
Glucose, Bld: 97 mg/dL (ref 70–99)
HCT: 35 % — ABNORMAL LOW (ref 36.0–46.0)
Hemoglobin: 11.9 g/dL — ABNORMAL LOW (ref 12.0–15.0)
Potassium: 4.4 mmol/L (ref 3.5–5.1)
Sodium: 136 mmol/L (ref 135–145)
TCO2: 33 mmol/L — ABNORMAL HIGH (ref 22–32)

## 2022-11-03 LAB — PROTIME-INR
INR: 0.9 (ref 0.8–1.2)
Prothrombin Time: 12.7 seconds (ref 11.4–15.2)

## 2022-11-03 LAB — ETHANOL: Alcohol, Ethyl (B): <10 mg/dL (ref <10)

## 2022-11-03 LAB — APTT: aPTT: 24 seconds (ref 24–36)

## 2022-11-03 MED ORDER — LORAZEPAM 0.5 MG PO TABS
0.5000 mg | ORAL_TABLET | ORAL | Status: AC | PRN
  Administered 2022-11-03: 0.5 mg via ORAL
  Filled 2022-11-03: qty 1

## 2022-11-03 MED ORDER — OXYCODONE HCL 5 MG PO TABS
10.0000 mg | ORAL_TABLET | ORAL | Status: DC | PRN
  Administered 2022-11-03 – 2022-11-04 (×4): 10 mg via ORAL
  Filled 2022-11-03 (×4): qty 2

## 2022-11-03 MED ORDER — PANTOPRAZOLE SODIUM 40 MG PO TBEC
40.0000 mg | DELAYED_RELEASE_TABLET | Freq: Every day | ORAL | Status: DC
  Administered 2022-11-03 – 2022-11-12 (×10): 40 mg via ORAL
  Filled 2022-11-03 (×10): qty 1

## 2022-11-03 MED ORDER — ENOXAPARIN SODIUM 40 MG/0.4ML IJ SOSY: 40.0000 mg | PREFILLED_SYRINGE | INTRAMUSCULAR | Status: DC

## 2022-11-03 MED ORDER — ASPIRIN 81 MG PO TBEC
81.0000 mg | DELAYED_RELEASE_TABLET | Freq: Every day | ORAL | Status: DC
  Administered 2022-11-03 – 2022-11-12 (×10): 81 mg via ORAL
  Filled 2022-11-03 (×10): qty 1

## 2022-11-03 MED ORDER — FENTANYL CITRATE PF 50 MCG/ML IJ SOSY
50.0000 ug | PREFILLED_SYRINGE | Freq: Once | INTRAMUSCULAR | Status: AC
  Administered 2022-11-03: 50 ug via INTRAVENOUS
  Filled 2022-11-03: qty 1

## 2022-11-03 MED ORDER — STROKE: EARLY STAGES OF RECOVERY BOOK
Freq: Once | Status: AC
  Filled 2022-11-03: qty 1

## 2022-11-03 MED ORDER — CLOPIDOGREL BISULFATE 75 MG PO TABS
75.0000 mg | ORAL_TABLET | Freq: Every day | ORAL | Status: DC
  Administered 2022-11-04: 75 mg via ORAL
  Filled 2022-11-03: qty 1

## 2022-11-03 MED ORDER — LEVOTHYROXINE SODIUM 75 MCG PO TABS
75.0000 ug | ORAL_TABLET | Freq: Every day | ORAL | Status: DC
  Administered 2022-11-04 – 2022-11-12 (×9): 75 ug via ORAL
  Filled 2022-11-03 (×9): qty 1

## 2022-11-03 MED ORDER — LEVOTHYROXINE SODIUM 75 MCG PO TABS: 75.0000 ug | ORAL_TABLET | Freq: Every day | ORAL | Status: DC

## 2022-11-03 MED ORDER — IOHEXOL 350 MG/ML SOLN
80.0000 mL | Freq: Once | INTRAVENOUS | Status: AC | PRN
  Administered 2022-11-03: 100 mL via INTRAVENOUS

## 2022-11-03 MED ORDER — FENTANYL CITRATE PF 50 MCG/ML IJ SOSY
25.0000 ug | PREFILLED_SYRINGE | Freq: Once | INTRAMUSCULAR | Status: AC
  Administered 2022-11-03: 25 ug via INTRAVENOUS
  Filled 2022-11-03: qty 1

## 2022-11-03 MED ORDER — ATORVASTATIN CALCIUM 40 MG PO TABS
40.0000 mg | ORAL_TABLET | Freq: Every day | ORAL | Status: DC
  Administered 2022-11-03 – 2022-11-12 (×10): 40 mg via ORAL
  Filled 2022-11-03 (×10): qty 1

## 2022-11-03 MED ORDER — GABAPENTIN 400 MG PO CAPS
800.0000 mg | ORAL_CAPSULE | Freq: Three times a day (TID) | ORAL | Status: DC
  Administered 2022-11-03 – 2022-11-04 (×2): 800 mg via ORAL
  Filled 2022-11-03 (×2): qty 2

## 2022-11-03 NOTE — ED Triage Notes (Signed)
C/o right numbness and weakness that started yesterday morning per family.  Unsure of LKN due to hx of dementia.  Family reports blood thinner usage but unsure which one. Chart shows Plavix.

## 2022-11-03 NOTE — ED Notes (Signed)
MD aware of patient.   

## 2022-11-03 NOTE — ED Provider Notes (Signed)
[place] EMERGENCY DEPARTMENT AT [facility] Provider Note   CSN: [id number] Arrival date & time: 11/03/22  1047     History  Chief Complaint  Patient presents with   Numbness    Amy Mckay is a 88 y.o. female.  HPI   88 year old female with medical history significant for PAD, COPD, HTN, GERD, dementia, presents to the emergency department with multiple complaints, primarily strokelike symptoms.  The patient has a history of bladder prolapse and had scheduled for CT imaging today but family was unable to proceed with CT imaging given the patient's new onset right hemibody numbness and weakness.  Symptoms started at least 2 days ago, more noticeable yesterday morning.  The patient has had right-sided facial numbness, no facial droop.  She endorses right hemibody numbness in her right arm and right leg with associated mild weakness in the right arm and right leg.  Family are unsure as to the patient's last known normal however they think symptoms may have started at least 2 days ago.  Patient is on Plavix, not on anticoagulation.  She denies any headache, fever, chills.  She endorses abdominal pain in the setting of her bladder prolapse.  No urinary symptoms.  On chart review, the patient did have significant carotid stenosis of a discharge summary on 10/18/2021.  Home Medications Prior to Admission medications   Medication Sig Start Date End Date Taking? Authorizing Provider  albuterol (PROVENTIL) (2.5 MG/3ML) 0.083% nebulizer solution Take 3 mLs (2.5 mg total) by nebulization every 4 (four) hours as needed for wheezing or shortness of breath. 10/18/21 10/18/22  Alessandra Bevels, MD  aspirin EC 81 MG tablet Take 1 tablet (81 mg total) by mouth daily. Hold for 1 week. May resume 10/26/21 if remains stable with worsening anemia/GI bleeds 10/18/21   Alessandra Bevels, MD  atorvastatin (LIPITOR) 10 MG tablet Take 10 mg by mouth daily.    [provider]  BREZTRI AEROSPHERE  160-9-4.8 MCG/ACT AERO Inhale 2 puffs into the lungs daily.    [provider]  Calcium Carb-Cholecalciferol (CALCIUM + VITAMIN D3 PO) Take 1 tablet by mouth daily.    [provider]  ciprofloxacin (CIPRO) 500 MG tablet Take 1 tablet (500 mg total) by mouth 2 (two) times daily. 09/10/22   Jacalyn Lefevre, MD  clopidogrel (PLAVIX) 75 MG tablet Take 75 mg by mouth daily. 06/04/16   [provider]  esomeprazole (NEXIUM) 40 MG capsule Take 40 mg by mouth daily in the afternoon. 11/01/20   [provider]  gabapentin (NEURONTIN) 800 MG tablet Take 800 mg by mouth 3 (three) times daily. 04/25/16   [provider]  hydrocortisone (ANUSOL-HC) 2.5 % rectal cream Place 1 Application rectally 2 (two) times daily. 09/10/22   Jacalyn Lefevre, MD  hydrocortisone (ANUSOL-HC) 25 MG suppository Place 1 suppository (25 mg total) rectally 2 (two) times daily. 10/18/21   Alessandra Bevels, MD  levothyroxine (SYNTHROID, LEVOTHROID) 75 MCG tablet Take 75 mcg by mouth daily before breakfast. 02/05/16   [provider]  memantine (NAMENDA) 10 MG tablet Take 10 mg by mouth 2 (two) times daily.    [provider]  metroNIDAZOLE (FLAGYL) 500 MG tablet Take 1 tablet (500 mg total) by mouth 2 (two) times daily. 09/10/22   Jacalyn Lefevre, MD  Multiple Vitamin (MULTIVITAMIN WITH MINERALS) TABS tablet Take 1 tablet by mouth daily with breakfast.    [provider]  nicotine polacrilex (NICORETTE) 2 MG gum Take 1 each (2 mg total)  by mouth as needed for smoking cessation. 10/18/21   Alessandra Bevels, MD  Oxycodone HCl 10 MG TABS Take 10 mg by mouth 4 (four) times daily.    [provider]  pantoprazole (PROTONIX) 40 MG tablet Take 1 tablet (40 mg total) by mouth 2 (two) times daily. 10/18/21 10/18/22  Alessandra Bevels, MD  pramipexole (MIRAPEX) 0.25 MG tablet Take 0.25 mg by mouth at bedtime. 01/02/21   [provider]  vitamin C (ASCORBIC ACID) 500 MG  tablet Take 500 mg by mouth daily.    [provider]      Allergies    Penicillins and Penicillin g    Review of Systems   Review of Systems  Unable to perform ROS: Dementia    Physical Exam Updated Vital Signs BP (!) 84/59   Pulse 97   Temp 98.3 F (36.8 C) (Oral)   Resp 16   Wt 55 kg   SpO2 91%   BMI 22.91 kg/m  Physical Exam Vitals and nursing note reviewed.  Constitutional:      General: She is not in acute distress.    Appearance: She is well-developed.  HENT:     Head: Normocephalic and atraumatic.  Eyes:     Conjunctiva/sclera: Conjunctivae normal.  Cardiovascular:     Rate and Rhythm: Normal rate and regular rhythm.     Heart sounds: No murmur heard. Pulmonary:     Effort: Pulmonary effort is normal. No respiratory distress.     Breath sounds: Normal breath sounds.  Abdominal:     Palpations: Abdomen is soft.     Tenderness: There is abdominal tenderness.  Musculoskeletal:        General: No swelling.     Cervical back: Neck supple.  Skin:    General: Skin is warm and dry.     Capillary Refill: Capillary refill takes less than 2 seconds.  Neurological:     Mental Status: She is alert.     Comments: MENTAL STATUS EXAM:    Orientation: Alert and oriented to person. Memory: Cooperative, follows commands well.  Language: Speech is clear and language is normal.   CRANIAL NERVES:    CN 2 (Optic): Visual fields intact to confrontation.  CN 3,4,6 (EOM): Pupils equal and reactive to light. Full extraocular eye movement without nystagmus.  CN 5 (Trigeminal): Facial sensation is DIMINISHED ON THE RIGHT, no weakness of masticatory muscles.  CN 7 (Facial): No facial weakness or asymmetry.  CN 8 (Auditory): Auditory acuity grossly normal.  CN 9,10 (Glossophar): The uvula is midline, the palate elevates symmetrically.  CN 11 (spinal access): Normal sternocleidomastoid and trapezius strength.  CN 12 (Hypoglossal): The tongue is midline. No atrophy or  fasciculations.Marland Kitchen   MOTOR:  Muscle Strength: 4/5RUE, 5/5LUE, 4/5RLE, 5/5LLE.   COORDINATION:  No tremor.   SENSATION:   Intact to light touch all four extremities, however, DIMINISHED ON THE RIGHT HEMIBODY.    Psychiatric:        Mood and Affect: Mood normal.     ED Results / Procedures / Treatments   Labs (all labs ordered are listed, but only abnormal results are displayed) Labs Reviewed  CBC - Abnormal; Notable for the following components:      Result Value   WBC 10.9 (*)    Hemoglobin 9.5 (*)    HCT 31.7 (*)    MCV 77.9 (*)    MCH 23.3 (*)    Platelets 460 (*)    All other components within  normal limits  DIFFERENTIAL - Abnormal; Notable for the following components:   Monocytes Absolute 1.1 (*)    All other components within normal limits  COMPREHENSIVE METABOLIC PANEL - Abnormal; Notable for the following components:   Sodium 134 (*)    Chloride 96 (*)    Glucose, Bld 101 (*)    All other components within normal limits  URINALYSIS, ROUTINE W REFLEX MICROSCOPIC - Abnormal; Notable for the following components:   Color, Urine STRAW (*)    Specific Gravity, Urine 1.003 (*)    Hgb urine dipstick MODERATE (*)    Leukocytes,Ua TRACE (*)    All other components within normal limits  I-STAT CHEM 8, ED - Abnormal; Notable for the following components:   Chloride 94 (*)    TCO2 33 (*)    Hemoglobin 11.9 (*)    HCT 35.0 (*)    All other components within normal limits  ETHANOL  PROTIME-INR  APTT  RAPID URINE DRUG SCREEN, HOSP PERFORMED  LIPID PANEL  HEMOGLOBIN A1C  CBC  COMPREHENSIVE METABOLIC PANEL  FERRITIN  IRON AND TIBC  TSH    EKG None  Radiology CT ANGIO HEAD NECK W WO CM  Result Date: 11/03/2022 CLINICAL DATA:  Stroke/TIA, determine embolic source EXAM: CT ANGIOGRAPHY HEAD AND NECK WITH AND WITHOUT CONTRAST TECHNIQUE: Multidetector CT imaging of the head and neck was performed using the standard protocol during bolus administration of intravenous  contrast. Multiplanar CT image reconstructions and MIPs were obtained to evaluate the vascular anatomy. Carotid stenosis measurements (when applicable) are obtained utilizing NASCET criteria, using the distal internal carotid diameter as the denominator. RADIATION DOSE REDUCTION: This exam was performed according to the departmental dose-optimization program which includes automated exposure control, adjustment of the mA and/or kV according to patient size and/or use of iterative reconstruction technique. CONTRAST:  OMNIPAQUE IOHEXOL 350 MG/ML SOLN COMPARISON:  MRI head from today. FINDINGS: CTA NECK FINDINGS Aortic arch: Visualized great vessel origins are patent. Aortic atherosclerosis. Right carotid system: Atherosclerosis at the carotid bifurcation without greater than 50% stenosis. Left carotid system: Atherosclerosis at the carotid bifurcation with approximately 45-50% stenosis. Vertebral arteries: Multifocal moderate stenosis of the right vertebral artery. Multifocal irregularity and mild narrowing of the left vertebral artery, likely due to atherosclerosis. Skeleton: Negative. Other neck: Negative. Upper chest: Emphysema.  Calcified granuloma in the left upper lobe. Review of the MIP images confirms the above findings CTA HEAD FINDINGS Anterior circulation: Bilateral intracranial ICAs, MCAs and ACAs are patent without proximal hemodynamically significant stenosis. Posterior circulation: Bilateral intradural vertebral arteries, basilar artery and posterior cerebral arteries are patent. Multifocal mild stenosis of the intradural vertebral arteries and moderate stenosis of the proximal basilar artery. Moderate right posterior cerebral artery stenosis. Venous sinuses: As permitted by contrast timing, patent. Review of the MIP images confirms the above findings IMPRESSION: CTA head: 1. No emergent large vessel occlusion. 2. 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 3. Moderate stenosis of the right  PCA. CTA neck: 1. Approximately 45-50% stenosis of the left ICA origin. 2. Multifocal moderate right and mild left vertebral artery stenosis/irregularity. 3. Aortic Atherosclerosis (ICD10-I70.0) and Emphysema (ICD10-J43.9). Electronically Signed   By: Feliberto Harts M.D.   On: 11/03/2022 16:07   CT ABDOMEN PELVIS W CONTRAST  Result Date: 11/03/2022 CLINICAL DATA:  Abdominal pain, right-sided numbness and weakness since yesterday EXAM: CT ABDOMEN AND PELVIS WITH CONTRAST TECHNIQUE: Multidetector CT imaging of the abdomen and pelvis was performed using the standard protocol following bolus  administration of intravenous contrast. Because of scanner malfunction, imaging was performed 3 minutes after contrast injection. RADIATION DOSE REDUCTION: This exam was performed according to the departmental dose-optimization program which includes automated exposure control, adjustment of the mA and/or kV according to patient size and/or use of iterative reconstruction technique. CONTRAST:  OMNIPAQUE IOHEXOL 350 MG/ML SOLN COMPARISON:  09/10/2022 FINDINGS: Lower chest: No acute pleural or parenchymal lung disease. Hepatobiliary: The liver is unremarkable. Gallbladder is fairly decompressed, with nonspecific areas of borderline gallbladder wall thickening measuring up to 4-5 mm. No evidence of cholelithiasis. No biliary duct dilation. Pancreas: Unremarkable. No pancreatic ductal dilatation or surrounding inflammatory changes. Spleen: Normal in size without focal abnormality. Adrenals/Urinary Tract: Excreted contrast within the kidneys, ureters, and bladder related to delayed imaging due to scanner malfunction. No filling defects. Kidneys appear unremarkable. The adrenals and bladder are unremarkable. Stomach/Bowel: No bowel obstruction or ileus. Moderate retained stool throughout the colon. Scattered colonic diverticulosis with no evidence of diverticulitis. Pelvic floor laxity, with pronounced rectal prolapse. Small  hiatal hernia. Vascular/Lymphatic: Aortic atherosclerosis. No enlarged abdominal or pelvic lymph nodes. Reproductive: Status post hysterectomy. No adnexal masses. Other: No free fluid or free intraperitoneal gas. No abdominal wall hernia. Musculoskeletal: No acute or destructive bony abnormalities. Reconstructed images demonstrate no additional findings. IMPRESSION: 1. Pelvic floor laxity, with pronounced rectal prolapse. 2. Nonspecific gallbladder wall thickening given decompressed state of the gallbladder. No evidence of cholelithiasis. If further evaluation is clinically indicated, right upper quadrant ultrasound could be considered. 3. Moderate retained stool throughout the colon consistent with constipation. No bowel obstruction or ileus. 4. Scattered colonic diverticulosis without diverticulitis. 5.  Aortic Atherosclerosis (ICD10-I70.0). Electronically Signed   By: Sharlet Salina M.D.   On: 11/03/2022 15:56   VAS [place] LOWER EXTREMITY VENOUS (DVT) (ONLY [facility] & [facility])  Result Date: 11/03/2022  Lower Venous DVT Study Patient Name:  MERILEE ARAUZA  Date of Exam:   11/03/2022 Medical Rec #: [id number]      Accession #:    [id number] Date of Birth: [birth year]      Patient Gender: F Patient Age:   82 years Exam Location:  [facility] Procedure:      VAS [place] LOWER EXTREMITY VENOUS (DVT) Referring Phys: Ernie Avena --------------------------------------------------------------------------------  Indications: Pain.  Risk Factors: None identified. Comparison Study: No prior studies. Performing Technologist: Chanda Busing RVT  Examination Guidelines: A complete evaluation includes B-mode imaging, spectral Doppler, color Doppler, and power Doppler as needed of all accessible portions of each vessel. Bilateral testing is considered an integral part of a complete examination. Limited examinations for reoccurring indications may be performed as noted. The reflux portion of the exam is performed with the patient in  reverse Trendelenburg.  +-----+---------------+---------+-----------+----------+--------------+ RIGHTCompressibilityPhasicitySpontaneityPropertiesThrombus Aging +-----+---------------+---------+-----------+----------+--------------+ CFV  Full           Yes      Yes                                 +-----+---------------+---------+-----------+----------+--------------+   +---------+---------------+---------+-----------+----------+--------------+ LEFT     CompressibilityPhasicitySpontaneityPropertiesThrombus Aging +---------+---------------+---------+-----------+----------+--------------+ CFV      Full           Yes      Yes                                 +---------+---------------+---------+-----------+----------+--------------+ SFJ      Full                                                        +---------+---------------+---------+-----------+----------+--------------+  FV Prox  Full                                                        +---------+---------------+---------+-----------+----------+--------------+ FV Mid   Full                                                        +---------+---------------+---------+-----------+----------+--------------+ FV DistalFull                                                        +---------+---------------+---------+-----------+----------+--------------+ PFV      Full                                                        +---------+---------------+---------+-----------+----------+--------------+ POP      Full           Yes      Yes                                 +---------+---------------+---------+-----------+----------+--------------+ PTV      Full                                                        +---------+---------------+---------+-----------+----------+--------------+ PERO     Full                                                         +---------+---------------+---------+-----------+----------+--------------+     Summary: RIGHT: - No evidence of common femoral vein obstruction.  LEFT: - There is no evidence of deep vein thrombosis in the lower extremity.  - No cystic structure found in the popliteal fossa.  *See table(s) above for measurements and observations.    Preliminary    MR BRAIN WO CONTRAST  Result Date: 11/03/2022 EXAM: MRI HEAD WITHOUT CONTRAST TECHNIQUE: Multiplanar, multiecho pulse sequences of the brain and surrounding structures were obtained without intravenous contrast. COMPARISON:  CT head from today. FINDINGS: Brain: Small acute perforator infarct in the left basal ganglia/overlying posterior left frontal white matter. Mild edema without mass effect. No evidence of acute hemorrhage, mass lesion, midline shift or hydrocephalus. Scattered T2/FLAIR hyperintensities within white matter, compatible with mild for age chronic microvascular ischemic change Vascular: Major arterial flow voids are maintained at the skull base. Skull and upper cervical spine: Normal marrow signal. Sinuses/Orbits: Clear sinuses.  No acute orbital findings. Other: No mastoid effusions. IMPRESSION: Small acute perforator infarct in the left  basal ganglia/overlying posterior left frontal posterior left frontal white matter. Electronically Signed   By: Feliberto Harts M.D.   On: 11/03/2022 14:33   CT HEAD WO CONTRAST  Result Date: 11/03/2022 CLINICAL DATA:  Neuro deficit, acute, stroke suspected EXAM: CT HEAD WITHOUT CONTRAST TECHNIQUE: Contiguous axial images were obtained from the base of the skull through the vertex without intravenous contrast. RADIATION DOSE REDUCTION: This exam was performed according to the departmental dose-optimization program which includes automated exposure control, adjustment of the mA and/or kV according to patient size and/or use of iterative reconstruction technique. COMPARISON:  CT Head Oct 13, 2021. FINDINGS: Brain:  No evidence of acute infarction, hemorrhage, hydrocephalus, extra-axial collection or mass lesion/mass effect. Cerebral atrophy. Remote right thalamic and caudate infarcts. Vascular: No hyperdense vessel. Skull: No acute fracture. Sinuses/Orbits: Clear sinuses.  No acute orbital findings. Other: No mastoid effusions. IMPRESSION: No evidence of acute intracranial abnormality. Remote right thalamic and caudate infarcts. Cerebral atrophy (ICD10-G31.9). Electronically Signed   By: Feliberto Harts M.D.   On: 11/03/2022 11:58    Procedures Procedures    Medications Ordered in ED Medications   stroke: early stages of recovery book (has no administration in time range)  atorvastatin (LIPITOR) tablet 40 mg (has no administration in time range)  LORazepam (ATIVAN) tablet 0.5 mg (0.5 mg Oral Given 11/03/22 1335)  fentaNYL (SUBLIMAZE) injection 50 mcg (50 mcg Intravenous Given 11/03/22 1330)  iohexol (OMNIPAQUE) 350 MG/ML injection 80 mL (100 mLs Intravenous Contrast Given 11/03/22 1522)    ED Course/ Medical Decision Making/ A&P                             Medical Decision Making Amount and/or Complexity of Data Reviewed Labs: ordered. Radiology: ordered.  Risk Prescription drug management. Decision regarding hospitalization.     88 year old female with medical history significant for PAD, COPD, HTN, GERD, dementia, presents to the emergency department with multiple complaints, primarily strokelike symptoms.  The patient has a history of bladder prolapse and had scheduled for CT imaging today but family was unable to proceed with CT imaging given the patient's new onset right hemibody numbness and weakness.  Symptoms started at least 2 days ago, more noticeable yesterday morning.  The patient has had right-sided facial numbness, no facial droop.  She endorses right hemibody numbness in her right arm and right leg with associated mild weakness in the right arm and right leg.  Family are unsure as to  the patient's last known normal however they think symptoms may have started at least 2 days ago.  Patient is on Plavix, not on anticoagulation.  She denies any headache, fever, chills.  She endorses abdominal pain in the setting of her bladder prolapse.  No urinary symptoms.  On chart review, the patient did have significant carotid stenosis of a discharge summary on 10/18/2021.  On arrival, the patient was afebrile, not tachycardic or tachypneic, BP 142/89, saturating 95% on room air.  Physical exam concerning for possible stroke with right facial numbness, no clear facial droop, right hemibody weakness 4-5 strength in the right upper and lower extremity, decrease sensation to light touch as well.  Given the patient was outside the window for TNK, code stroke was not called.  Spoke with Dr. Selina Cooley, on-call neuro, who recommended admission to [facility] if the patient is found to have had a stroke.   CT head was performed which revealed no acute infarct.  The patient  did endorse some left leg cramping and DVT ultrasound revealed no evidence of DVT.  MRI brain was performed revealed an acute stroke. IMPRESSION:  Small acute perforator infarct in the left basal ganglia/overlying  posterior left frontal posterior left frontal white matter.   CTA head and neck and CT abdomen pelvis were pending as the patient had gone for MRI prior to obtaining those results.  Discussed the care of the patient with Dr. Selina Cooley who recommended hospitalist admission for stroke workup at [facility].  Lab evaluation significant for CMP, unremarkable, urinalysis without evidence of UTI, UDS negative, ethanol level normal, CBC with a nonspecific leukocytosis to 10.9, hemoglobin stable at 9.5.  Ct Abdomen Pelvis: IMPRESSION:  1. Pelvic floor laxity, with pronounced rectal prolapse.  2. Nonspecific gallbladder wall thickening given decompressed state  of the gallbladder. No evidence of cholelithiasis. If further  evaluation is  clinically indicated, right upper quadrant ultrasound  could be considered.  3. Moderate retained stool throughout the colon consistent with  constipation. No bowel obstruction or ileus.  4. Scattered colonic diverticulosis without diverticulitis.  5.  Aortic Atherosclerosis (ICD10-I70.0).    CTA Head and Neck: IMPRESSION:  CTA head:    1. No emergent large vessel occlusion.  2. 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  3. Moderate stenosis of the right PCA.    CTA neck:    1. Approximately 45-50% stenosis of the left ICA origin.  2. Multifocal moderate right and mild left vertebral artery  stenosis/irregularity.  3. Aortic Atherosclerosis (ICD10-I70.0) and Emphysema (ICD10-J43.9).    Dr Allena Katz of hospitalist medicine consulted for admission, plan for admission to [facility],   Final Clinical Impression(s) / ED Diagnoses Final diagnoses:  Weakness  Numbness  Cerebrovascular accident (CVA), unspecified mechanism [facility])    Rx / DC Orders ED Discharge Orders     None         Ernie Avena, MD 11/03/22 1743

## 2022-11-03 NOTE — ED Provider Notes (Signed)
Patient being transferred.   Is on chronic pain meds, indicates chronic back pain, and is requesting something for pain. Pt alert, awake.   Fentanyl 25 mcg.   Pt appears stable for transport.    Cathren Laine, MD 11/03/22 [phone]

## 2022-11-03 NOTE — Progress Notes (Signed)
Left lower extremity venous duplex has been completed. Preliminary results can be found in CV Proc through chart review.  Results were given to Dr. Rubin Payor.  11/03/22 2:48 PM Olen Cordial RVT

## 2022-11-03 NOTE — ED Notes (Signed)
Transportation called in to [facility].

## 2022-11-03 NOTE — H&P (Signed)
History and Physical    Patient: Amy Mckay ZOX:[id number] DOB: 21-Jun-1934 DOA: 11/03/2022 DOS: the patient was seen and examined on 11/03/2022 PCP: Annita Brod, MD  Patient coming from: Home  Chief Complaint:  Chief Complaint  Patient presents with   Numbness   HPI: Amy Mckay is a 88 y.o. female with medical history significant of  PAD, COPD, HTN, GERD, rectal prolapse and dementia presents to the emergency department with right-sided weakness. Her symptoms started 2 days ago but worsened yesterday morning. No family at bedside today. Denies facial drooping or slurred speech.  She reports that she has poor mobility and strength at baseline. Denies history of stroke in the past. Patient endorses dizziness when she sits up.  Denies headache, vision changes, confusion, vomiting, speech changes, seizures, ataxia or urinary/bowel incontinence.  Denies chest pain, palpitations, cough, dyspnea or fevers. dysuria, frequency, urgency or hematuria.  Patient lives with daughter. Has an home health aide for 2 hrs day.  She uses walker sometimes.   Labs WBC 10.9, hemoglobin 9.5, MCV 77, platelets 460, sodium 136, UDS negative, UA: moderate hemoglobin urine, trace leukocytes, low specific gravity 1.00. microscopy: RBC, WBC 0-5, bacterial none squamous epithelial 0-5  Imaging  CTA head:  . No emergent large vessel occlusion. 2. 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 3. Moderate stenosis of the right PCA.   CTA neck:  1. Approximately 45-50% stenosis of the left ICA origin. 2. Multifocal moderate right and mild left vertebral artery stenosis/irregularity. 3. Aortic Atherosclerosis (ICD10-I70.0) and Emphysema (ICD10-J43.9).    MRI brain Small acute perforator infarct in the left basal ganglia/overlying posterior left frontal posterior left frontal white matter.     DVT ultrasound RIGHT: No evidence of common femoral vein obstruction.    LEFT:  - There is no evidence of  deep vein thrombosis in the lower extremity.   - No cystic structure found in the popliteal fossa.   CT abdomen pelvis without contrast. 1. Pelvic floor laxity, with pronounced rectal prolapse. 2. Nonspecific gallbladder wall thickening given decompressed state of the gallbladder. No evidence of cholelithiasis. If further evaluation is clinically indicated, right upper quadrant ultrasound could be considered. 3. Moderate retained stool throughout the colon consistent with constipation. No bowel obstruction or ileus. 4. Scattered colonic diverticulosis without diverticulitis. 5.  Aortic Atherosclerosis (ICD10-I70.0).   ED course Vital signs on admission BP 142/89, saturation 95% on room air, RR 16, pulse 98, temp 98.3.  On examination patient noted to have 4-5 strength upper and lower right extremities and decreased sensation.  Patient received 50 mcg of fentanyl IV for pain and 3.5 mg of Ativan for MRI sedation.  Code stroke was not called as patient was outside of window for thrombolysis.  ED consulted Dr. Selina Cooley, on-call for neurology to continue admission to [facility].   Review of Systems: As mentioned in the history of present illness. All other systems reviewed and are negative. Past Medical History:  Diagnosis Date   Acid reflux    COPD (chronic obstructive pulmonary disease) (HCC)    Hypertension    Past Surgical History:  Procedure Laterality Date   BIOPSY  10/17/2021   Procedure: BIOPSY;  Surgeon: Charlott Rakes, MD;  Location: [facility];  Service: Gastroenterology;;   COLONOSCOPY N/A 10/17/2021   Procedure: COLONOSCOPY;  Surgeon: Charlott Rakes, MD;  Location: [facility];  Service: Gastroenterology;  Laterality: N/A;   COLONOSCOPY WITH PROPOFOL N/A 10/18/2021   Procedure: COLONOSCOPY WITH PROPOFOL;  Surgeon: Kathi Der, MD;  Location: [facility];  Service: Gastroenterology;  Laterality: N/A;   ESOPHAGOGASTRODUODENOSCOPY N/A 10/17/2021   Procedure:  ESOPHAGOGASTRODUODENOSCOPY (EGD);  Surgeon: Charlott Rakes, MD;  Location: [facility];  Service: Gastroenterology;  Laterality: N/A;   POLYPECTOMY  10/18/2021   Procedure: POLYPECTOMY;  Surgeon: Kathi Der, MD;  Location: [facility];  Service: Gastroenterology;;   Social History:  reports that she has been smoking cigarettes. She has been smoking an average of 1.5 packs per day. She has never used smokeless tobacco. She reports that she does not drink alcohol and does not use drugs.  Allergies  Allergen Reactions   Penicillins Anaphylaxis, Swelling and Other (See Comments)   Penicillin G Hives    History reviewed. No pertinent family history.  Prior to Admission medications   Medication Sig Start Date End Date Taking? Authorizing Provider  albuterol (PROVENTIL) (2.5 MG/3ML) 0.083% nebulizer solution Take 3 mLs (2.5 mg total) by nebulization every 4 (four) hours as needed for wheezing or shortness of breath. 10/18/21 10/18/22  Alessandra Bevels, MD  aspirin EC 81 MG tablet Take 1 tablet (81 mg total) by mouth daily. Hold for 1 week. May resume 10/26/21 if remains stable with worsening anemia/GI bleeds 10/18/21   Alessandra Bevels, MD  atorvastatin (LIPITOR) 10 MG tablet Take 10 mg by mouth daily.    [provider]  BREZTRI AEROSPHERE 160-9-4.8 MCG/ACT AERO Inhale 2 puffs into the lungs daily.    [provider]  Calcium Carb-Cholecalciferol (CALCIUM + VITAMIN D3 PO) Take 1 tablet by mouth daily.    [provider]  ciprofloxacin (CIPRO) 500 MG tablet Take 1 tablet (500 mg total) by mouth 2 (two) times daily. 09/10/22   Jacalyn Lefevre, MD  clopidogrel (PLAVIX) 75 MG tablet Take 75 mg by mouth daily. 06/04/16   [provider]  esomeprazole (NEXIUM) 40 MG capsule Take 40 mg by mouth daily in the afternoon. 11/01/20   [provider]  gabapentin (NEURONTIN) 800 MG tablet Take 800 mg by mouth 3 (three) times daily. 04/25/16   [provider]  hydrocortisone (ANUSOL-HC) 2.5 % rectal cream Place 1 Application rectally 2 (two) times daily. 09/10/22   Jacalyn Lefevre, MD  hydrocortisone (ANUSOL-HC) 25 MG suppository Place 1 suppository (25 mg total) rectally 2 (two) times daily. 10/18/21   Alessandra Bevels, MD  levothyroxine (SYNTHROID, LEVOTHROID) 75 MCG tablet Take 75 mcg by mouth daily before breakfast. 02/05/16   [provider]  memantine (NAMENDA) 10 MG tablet Take 10 mg by mouth 2 (two) times daily.    [provider]  metroNIDAZOLE (FLAGYL) 500 MG tablet Take 1 tablet (500 mg total) by mouth 2 (two) times daily. 09/10/22   Jacalyn Lefevre, MD  Multiple Vitamin (MULTIVITAMIN WITH MINERALS) TABS tablet Take 1 tablet by mouth daily with breakfast.    [provider]  nicotine polacrilex (NICORETTE) 2 MG gum Take 1 each (2 mg total) by mouth as needed for smoking cessation. 10/18/21   Alessandra Bevels, MD  Oxycodone HCl 10 MG TABS Take 10 mg by mouth 4 (four) times daily.    [provider]  pantoprazole (PROTONIX) 40 MG tablet Take 1 tablet (40 mg total) by mouth 2 (two) times daily. 10/18/21 10/18/22  Alessandra Bevels, MD  pramipexole (MIRAPEX) 0.25 MG tablet Take 0.25 mg by mouth at bedtime. 01/02/21   [provider]  vitamin C (ASCORBIC ACID) 500 MG tablet Take 500 mg by mouth daily.    [provider]    Physical Exam:  Vitals:   11/03/22 1443 11/03/22 1600 11/03/22 1615 11/03/22 1625  BP:  118/85 94/73 (!) 84/59  Pulse:  97 96 97  Resp:      Temp: 98.3 F (36.8 C)     TempSrc: Oral     SpO2:  (!) 88% (!) 88% 91%  Weight:       General: Alert, no acute distress, frail 88 year old Caucasian female Cardio: Normal S1 and S2, RRR, no r/m/g Pulm: CTAB, normal work of breathing Abdomen: Bowel sounds normal. Abdomen soft and non-tender, rectal prolapse (chaperoned by RN) Extremities: No peripheral edema.  Neuro: Cranial nerves grossly intact, PERRLA 4 mm bilaterally, alert and  orientated to name, place, date of birth and time, 4/5 strength upper and lower right extremity, 5/5 strength upper and lower left extremity, no obvious facial droop, no slurring of speech  Data Reviewed:  There are no new results to review at this time.  Assessment and Plan: No notes have been filed under this hospital service. Service: Hospitalist No problem-specific Assessment & Plan notes found for this encounter. Principal Problem:   Stroke [facility])   Left basal ganglia stroke 2-day history of new right sided hemiparesis with mild sensory loss. MRI brain Small acute perforator infarct in the left basal ganglia/overlying posterior left frontal posterior left frontal white matter. Neurology consulted by ED. Will formally consult when patient reaches [facility]. Will await their formal recommendations.  For now will start patient's home aspirin and Plavix. - Admit to medical  telemetry, transfer to [facility]  -Patient will be taken over by Dr. Lynden Oxford from [facility] -Vitals per floor protocol -Up with assistance  -Bedside swallow -Q2H neuro checks - Echo - Permissive hypertension x24 hours -Orthostatic BPs. - Risk strat labs: hgb A1C, lipid, TSH - NPO pending swallow eval - PT/OT/SLP - start Atorvastatin 40mg   --SCDs for DVT prophylaxis as patient has anemia  PAD, chronic stable -Continue aspirin and Plavix   Microcytic anemia secondary to chronic blood loss Hb 9.2 on admission, 1 year ago Hb was 9.3.  No active bleed on examination today however patient does report her rectal prolapse bleeds often.  Patient likely the cause of her microcytic anemia -Monitor for signs of bleeding -Daily CBC -Iron studies a.m. -SCDs for DVT prophylaxis -Consider GI cancer workup  Hypothyroidism, chronic stable No known TSH on chart review -Continue home levothyroxine 75 mcg -Follow-up TSH  Leukocytosis, thrombocytosis Likely in the setting of acute inflammation -Monitor daily CBC    Rectal prolapse, chronic stable Patient is on exam today with RN as a chaperone -Continue rectal prolapse care  Chronic pain PDMP reviewed -Continue home gabapentin 300 mg 3 times daily and oxycodone 10 mg 4 times daily  Dementia chronic stable Sleep continue home memantine 10 mg twice daily  GERD, chronic, stable Patient takes 40 mg of Nexium at home -40 mg Protonix per hospital formulary  COPD, chronic stable Patient does not regularly use regular inhaler    Advance Care Planning:   Code Status: Full Code patient is noted to be DNR however she would like to not be full code after full discussion at bedside today  Consults: Neurology  Family Communication:   Severity of Illness: The appropriate patient status for this patient is INPATIENT. Inpatient status is judged to be reasonable and necessary in order to provide the required intensity of service to ensure the patient's safety. The patient's presenting symptoms, physical exam findings, and initial radiographic and laboratory data in the context of  their chronic comorbidities is felt to place them at high risk for further clinical deterioration. Furthermore, it is not anticipated that the patient will be medically stable for discharge from the hospital within 2 midnights of admission.   * I certify that at the point of admission it is my clinical judgment that the patient will require inpatient hospital care spanning beyond 2 midnights from the point of admission due to high intensity of service, high risk for further deterioration and high frequency of surveillance required.*  Author: Rolm Gala, MD 11/03/2022 5:38 PM  For on call review [email or web address].

## 2022-11-03 NOTE — ED Notes (Signed)
Report called in to [facility] admitting nurse at this time.

## 2022-11-03 NOTE — ED Notes (Signed)
Pt noted with low sats 85% room air, O2 placed at 2 liters/min

## 2022-11-03 NOTE — Consult Note (Signed)
NEUROLOGY CONSULTATION NOTE   Date of service: Nov 03, 2022 Patient Name: Amy Mckay MRN:  [id number] DOB:  1934/08/19 Reason for consult: "R sided weakness" Requesting Provider: Sherlyn Lick, MD _ _ _   _ __   _ __ _ _  __ __   _ __   __ _  History of Present Illness  Amy Mckay is a 88 y.o. female with PMH significant for COPDm HTN, PAD, GERD who presents with 2 day hx of R sided weakness.  She had MRI brain which demonstrates a small L BG infarct, CTA with 45-50% L ICA stenosis at the origin.  Denies any prior hx of strokes.  LKW: 11/01/22 mRS: 3 tNKASE: not offered, outside window Thrombectomy: not offered, outside window and not consistent with an LVO NIHSS components Score: Comment  1a Level of Conscious 0[]  1[]  2[]  3[]      1b LOC Questions 0[]  1[]  2[]       1c LOC Commands 0[]  1[]  2[]       2 Best Gaze 0[]  1[]  2[]       3 Visual 0[]  1[]  2[]  3[]      4 Facial Palsy 0[]  1[]  2[]  3[]      5a Motor Arm - left 0[]  1[]  2[]  3[]  4[]  UN[]    5b Motor Arm - Right 0[]  1[]  2[]  3[]  4[]  UN[]    6a Motor Leg - Left 0[]  1[]  2[]  3[]  4[]  UN[]    6b Motor Leg - Right 0[]  1[]  2[]  3[]  4[]  UN[]    7 Limb Ataxia 0[]  1[]  2[]  3[]  UN[]     8 Sensory 0[]  1[]  2[]  UN[]      9 Best Language 0[]  1[]  2[]  3[]      10 Dysarthria 0[]  1[]  2[]  UN[]      11 Extinct. and Inattention 0[]  1[]  2[]       TOTAL:        ROS   Constitutional Denies weight loss, fever and chills. ***  HEENT Denies changes in vision and hearing. ***  Respiratory Denies SOB and cough. ***  CV Denies palpitations and CP ***  GI Denies abdominal pain, nausea, vomiting and diarrhea. ***  GU Denies dysuria and urinary frequency. ***  MSK Denies myalgia and joint pain. ***  Skin Denies rash and pruritus. ***  Neurological Denies headache and syncope. ***  Psychiatric Denies recent changes in mood. Denies anxiety and depression. ***   Past History   Past Medical History:  Diagnosis Date   Acid reflux    COPD (chronic  obstructive pulmonary disease) (HCC)    Hypertension    Past Surgical History:  Procedure Laterality Date   BIOPSY  10/17/2021   Procedure: BIOPSY;  Surgeon: Charlott Rakes, MD;  Location: [facility];  Service: Gastroenterology;;   COLONOSCOPY N/A 10/17/2021   Procedure: COLONOSCOPY;  Surgeon: Charlott Rakes, MD;  Location: [facility];  Service: Gastroenterology;  Laterality: N/A;   COLONOSCOPY WITH PROPOFOL N/A 10/18/2021   Procedure: COLONOSCOPY WITH PROPOFOL;  Surgeon: Kathi Der, MD;  Location: [facility];  Service: Gastroenterology;  Laterality: N/A;   ESOPHAGOGASTRODUODENOSCOPY N/A 10/17/2021   Procedure: ESOPHAGOGASTRODUODENOSCOPY (EGD);  Surgeon: Charlott Rakes, MD;  Location: [facility];  Service: Gastroenterology;  Laterality: N/A;   POLYPECTOMY  10/18/2021   Procedure: POLYPECTOMY;  Surgeon: Kathi Der, MD;  Location: [facility];  Service: Gastroenterology;;   History reviewed. No pertinent family history. Social History   Socioeconomic History   Marital status: Single    Spouse name: Not on file   Number of children: Not  on file   Years of education: Not on file   Highest education level: Not on file  Occupational History   Not on file  Tobacco Use   Smoking status: Every Day    Packs/day: 1.5    Types: Cigarettes   Smokeless tobacco: Never  Vaping Use   Vaping Use: Never used  Substance and Sexual Activity   Alcohol use: No   Drug use: No   Sexual activity: Not on file  Other Topics Concern   Not on file  Social History Narrative   Not on file   Social Determinants of Health   Financial Resource Strain: Not on file  Food Insecurity: No Food Insecurity (11/03/2022)   Hunger Vital Sign    Worried About Running Out of Food in the Last Year: Never true    Ran Out of Food in the Last Year: Never true  Transportation Needs: No Transportation Needs (11/03/2022)   PRAPARE - Administrator, Civil Service (Medical): No    Lack of  Transportation (Non-Medical): No  Physical Activity: Not on file  Stress: Not on file  Social Connections: Not on file   Allergies  Allergen Reactions   Penicillins Anaphylaxis, Swelling and Other (See Comments)   Penicillin G Hives    Medications   Medications Prior to Admission  Medication Sig Dispense Refill Last Dose   albuterol (VENTOLIN HFA) 108 (90 Base) MCG/ACT inhaler Inhale 1-2 puffs into the lungs every 6 (six) hours as needed for wheezing or shortness of breath.   unk   aspirin EC 81 MG tablet Take 1 tablet (81 mg total) by mouth daily. Hold for 1 week. May resume 10/26/21 if remains stable with worsening anemia/GI bleeds 30 tablet 11 11/03/2022   atorvastatin (LIPITOR) 10 MG tablet Take 10 mg by mouth daily.   11/02/2022   BREO ELLIPTA 200-25 MCG/ACT AEPB Inhale 1 puff into the lungs daily.   11/02/2022   Calcium Carb-Cholecalciferol (CALCIUM + VITAMIN D3 PO) Take 1 tablet by mouth daily.   unk   clopidogrel (PLAVIX) 75 MG tablet Take 75 mg by mouth daily.  2 11/02/2022   esomeprazole (NEXIUM) 40 MG capsule Take 40 mg by mouth daily in the afternoon.   Past Week   gabapentin (NEURONTIN) 800 MG tablet Take 800 mg by mouth 3 (three) times daily.  3 11/03/2022   levothyroxine (SYNTHROID, LEVOTHROID) 75 MCG tablet Take 75 mcg by mouth daily before breakfast.   11/03/2022   memantine (NAMENDA) 10 MG tablet Take 10 mg by mouth 2 (two) times daily.   11/03/2022   Multiple Vitamin (MULTIVITAMIN WITH MINERALS) TABS tablet Take 1 tablet by mouth daily with breakfast.   unk   Oxycodone HCl 10 MG TABS Take 10 mg by mouth 4 (four) times daily.   11/03/2022   pantoprazole (PROTONIX) 40 MG tablet Take 1 tablet (40 mg total) by mouth 2 (two) times daily. 30 tablet 1 11/03/2022   pramipexole (MIRAPEX) 0.25 MG tablet Take 0.25 mg by mouth 3 (three) times daily.   11/03/2022   vitamin C (ASCORBIC ACID) 500 MG tablet Take 500 mg by mouth daily.   unk   ciprofloxacin (CIPRO) 500 MG tablet Take 1 tablet  (500 mg total) by mouth 2 (two) times daily. (Patient not taking: Reported on 11/03/2022) 14 tablet 0 Completed Course   hydrocortisone (ANUSOL-HC) 2.5 % rectal cream Place 1 Application rectally 2 (two) times daily. (Patient not taking: Reported on 11/03/2022) 30 g 0  Not Taking   hydrocortisone (ANUSOL-HC) 25 MG suppository Place 1 suppository (25 mg total) rectally 2 (two) times daily. (Patient not taking: Reported on 11/03/2022) 12 suppository 0 Not Taking   metroNIDAZOLE (FLAGYL) 500 MG tablet Take 1 tablet (500 mg total) by mouth 2 (two) times daily. (Patient not taking: Reported on 11/03/2022) 14 tablet 0 Completed Course   nicotine polacrilex (NICORETTE) 2 MG gum Take 1 each (2 mg total) by mouth as needed for smoking cessation. (Patient not taking: Reported on 11/03/2022) 100 tablet 0 Not Taking     Vitals   Vitals:   11/03/22 1700 11/03/22 1800 11/03/22 1845 11/03/22 2032  BP: (!) 145/101  (!) 130/97 (!) 120/106  Pulse: 98  96 100  Resp:   19 18  Temp:  98.1 F (36.7 C)  98.4 F (36.9 C)  TempSrc:  Oral  Oral  SpO2: 91%  94% 99%  Weight:    55.2 kg  Height:    5\' 1"  (1.549 m)     Body mass index is 22.99 kg/m.  Physical Exam   General: Laying comfortably in bed; in no acute distress. *** HENT: Normal oropharynx and mucosa. Normal external appearance of ears and nose. *** Neck: Supple, no pain or tenderness *** CV: No JVD. No peripheral edema. *** Pulmonary: Symmetric Chest rise. Normal respiratory effort. *** Abdomen: Soft to touch, non-tender. *** Ext: No cyanosis, edema, or deformity *** Skin: No rash. Normal palpation of skin.  *** Musculoskeletal: Normal digits and nails by inspection. No clubbing. ***  Neurologic Examination  Mental status/Cognition: Alert, oriented to self, place, month and year, good attention. *** Speech/language: Fluent, comprehension intact, object naming intact, repetition intact. *** Cranial nerves:   CN II Pupils equal and reactive to light,  no VF deficits ***   CN III,IV,VI EOM intact, no gaze preference or deviation, no nystagmus ***   CN V normal sensation in V1, V2, and V3 segments bilaterally ***   CN VII no asymmetry, no nasolabial fold flattening ***   CN VIII normal hearing to speech ***   CN IX & X normal palatal elevation, no uvular deviation ***   CN XI 5/5 head turn and 5/5 shoulder shrug bilaterally ***   CN XII midline tongue protrusion ***   Motor:  Muscle bulk: ***, tone ***, pronator drift *** tremor *** Mvmt Root Nerve  Muscle Right Left Comments  SA C5/6 Ax Deltoid     EF C5/6 Mc Biceps     EE C6/7/8 Rad Triceps     WF C6/7 Med FCR     WE C7/8 PIN ECU     F Ab C8/T1 U ADM/FDI     HF L1/2/3 Fem Illopsoas     KE L2/3/4 Fem Quad     DF L4/5 D Peron Tib Ant     PF S1/2 Tibial Grc/Sol      Reflexes:  Right Left Comments  Pectoralis      Biceps (C5/6)     Brachioradialis (C5/6)      Triceps (C6/7)      Patellar (L3/4)      Achilles (S1)      Hoffman      Plantar     Jaw jerk    Sensation:  Light touch    Pin prick    Temperature    Vibration   Proprioception    Coordination/Complex Motor:  - Finger to Nose *** - Heel to shin *** - Rapid alternating movement *** -  Gait: Stride length ***. Arm swing ***. Base width ***  Labs   CBC:  Recent Labs  Lab 11/03/22 1136 11/03/22 1146  WBC 10.9*  --   NEUTROABS 6.7  --   HGB 9.5* 11.9*  HCT 31.7* 35.0*  MCV 77.9*  --   PLT 460*  --     Basic Metabolic Panel:  Lab Results  Component Value Date   NA 136 11/03/2022   K 4.4 11/03/2022   CO2 30 11/03/2022   GLUCOSE 97 11/03/2022   BUN 20 11/03/2022   CREATININE 0.90 11/03/2022   CALCIUM 9.0 11/03/2022   GFRNONAA >60 11/03/2022   GFRAA >60 06/14/2016   Lipid Panel:  Lab Results  Component Value Date   LDLCALC 73 01/30/2021   HgbA1c: No results found for: "HGBA1C" Urine Drug Screen:     Component Value Date/Time   LABOPIA NONE DETECTED 11/03/2022 1138   COCAINSCRNUR NONE  DETECTED 11/03/2022 1138   LABBENZ NONE DETECTED 11/03/2022 1138   AMPHETMU NONE DETECTED 11/03/2022 1138   THCU NONE DETECTED 11/03/2022 1138   LABBARB NONE DETECTED 11/03/2022 1138    Alcohol Level     Component Value Date/Time   ETH <10 11/03/2022 1136    CT Head without contrast(Personally reviewed): ***  CT angio Head and Neck with contrast(Personally reviewed): ***  MR Angio head without contrast and Carotid Duplex BL(Personally reviewed): ***  MRI Brain(Personally reviewed): ***  rEEG:  ***  Impression   Amy Mckay is a 88 y.o. female with PMH significant for ***. Her neurologic examination is notable for ***.  Primary Diagnosis:  {Cerebral Infarction:22351}  Secondary Diagnosis: {Stroke Comorbidities:21266}  Recommendations  *** ______________________________________________________________________   Thank you for the opportunity to take part in the care of this patient. If you have any further questions, please contact the neurology consultation attending.  Signed,  Erick Blinks [facility] Pager Number [phone] _ _ _   _ __   _ __ _ _  __ __   _ __   __ _

## 2022-11-04 ENCOUNTER — Inpatient Hospital Stay (HOSPITAL_COMMUNITY): Payer: 59

## 2022-11-04 DIAGNOSIS — I6300 Cerebral infarction due to thrombosis of unspecified precerebral artery: Secondary | ICD-10-CM

## 2022-11-04 DIAGNOSIS — I6389 Other cerebral infarction: Secondary | ICD-10-CM

## 2022-11-04 LAB — ECHOCARDIOGRAM COMPLETE
AR max vel: 1.72 cm2
AV Peak grad: 6.2 mmHg
Ao pk vel: 1.24 m/s
Area-P 1/2: 4.31 cm2
Height: 61 in
S' Lateral: 2.15 cm
Weight: 1947.1 oz

## 2022-11-04 LAB — LIPID PANEL
Cholesterol: 203 mg/dL — ABNORMAL HIGH (ref 0–200)
HDL: 54 mg/dL (ref >40)
LDL Cholesterol: 134 mg/dL — ABNORMAL HIGH (ref 0–99)
Total CHOL/HDL Ratio: 3.8 RATIO
Triglycerides: 76 mg/dL (ref <150)
VLDL: 15 mg/dL (ref 0–40)

## 2022-11-04 LAB — HEMOGLOBIN A1C
Hgb A1c MFr Bld: 6.3 % — ABNORMAL HIGH (ref 4.8–5.6)
Mean Plasma Glucose: 134.11 mg/dL

## 2022-11-04 MED ORDER — SODIUM CHLORIDE 0.9 % IV BOLUS
1000.0000 mL | Freq: Once | INTRAVENOUS | Status: AC
  Administered 2022-11-04: 1000 mL via INTRAVENOUS

## 2022-11-04 MED ORDER — ONDANSETRON HCL 4 MG/2ML IJ SOLN
4.0000 mg | Freq: Four times a day (QID) | INTRAMUSCULAR | Status: DC | PRN
  Administered 2022-11-04 – 2022-11-06 (×3): 4 mg via INTRAVENOUS
  Filled 2022-11-04 (×3): qty 2

## 2022-11-04 MED ORDER — SENNOSIDES-DOCUSATE SODIUM 8.6-50 MG PO TABS
1.0000 | ORAL_TABLET | Freq: Once | ORAL | Status: AC
  Administered 2022-11-04: 1 via ORAL
  Filled 2022-11-04: qty 1

## 2022-11-04 MED ORDER — SENNOSIDES-DOCUSATE SODIUM 8.6-50 MG PO TABS
1.0000 | ORAL_TABLET | Freq: Two times a day (BID) | ORAL | Status: DC | PRN
  Administered 2022-11-05 – 2022-11-08 (×3): 1 via ORAL
  Filled 2022-11-04 (×3): qty 1

## 2022-11-04 MED ORDER — GABAPENTIN 300 MG PO CAPS
300.0000 mg | ORAL_CAPSULE | Freq: Three times a day (TID) | ORAL | Status: DC
  Administered 2022-11-04 – 2022-11-05 (×2): 300 mg via ORAL
  Filled 2022-11-04 (×2): qty 1

## 2022-11-04 MED ORDER — POLYETHYLENE GLYCOL 3350 17 G PO PACK
17.0000 g | PACK | Freq: Once | ORAL | Status: AC
  Administered 2022-11-04: 17 g via ORAL
  Filled 2022-11-04: qty 1

## 2022-11-04 MED ORDER — OXYCODONE HCL 5 MG PO TABS
10.0000 mg | ORAL_TABLET | Freq: Three times a day (TID) | ORAL | Status: DC | PRN
  Administered 2022-11-04 – 2022-11-05 (×2): 10 mg via ORAL
  Filled 2022-11-04 (×2): qty 2

## 2022-11-04 MED ORDER — TICAGRELOR 90 MG PO TABS
90.0000 mg | ORAL_TABLET | Freq: Two times a day (BID) | ORAL | Status: DC
  Administered 2022-11-05 – 2022-11-12 (×15): 90 mg via ORAL
  Filled 2022-11-04 (×15): qty 1

## 2022-11-04 MED ORDER — POLYETHYLENE GLYCOL 3350 17 G PO PACK
17.0000 g | PACK | Freq: Two times a day (BID) | ORAL | Status: DC | PRN
  Administered 2022-11-05 – 2022-11-08 (×4): 17 g via ORAL
  Filled 2022-11-04 (×4): qty 1

## 2022-11-04 NOTE — Evaluation (Signed)
Speech Language Pathology Evaluation Patient Details Name: Journi Canipe MRN: [id number] DOB: June 08, 1935 Today's Date: 11/04/2022 Time: 4008-6761 SLP Time Calculation (min) (ACUTE ONLY): 20 min  Problem List:  Patient Active Problem List   Diagnosis Date Noted   Stroke (HCC) 11/03/2022   Heme positive stool 10/17/2021   Esophageal dysphagia 10/14/2021   Acute toxic encephalopathy 10/14/2021   Orthostatic hypotension 10/14/2021   Near syncope 10/13/2021   Iron deficiency anemia due to chronic blood loss 10/13/2021   Chronic respiratory failure with hypoxia (HCC) 10/13/2021   Chronic lower back pain 10/13/2021   Tobacco use disorder 10/13/2021   Pulmonary nodules/lesions, multiple 10/13/2021   Acute on chronic respiratory failure with hypoxia (HCC) 02/02/2021   COVID-19 virus infection 02/01/2021   AKI (acute kidney injury) (HCC) 02/01/2021   Peripheral arterial disease (HCC) 02/01/2021   Benign essential HTN 02/01/2021   COPD (chronic obstructive pulmonary disease) (HCC) 02/01/2021   Cigarette smoker 02/01/2021   Hypothyroidism 02/01/2021   Memory impairment 02/01/2021   Restless leg 02/01/2021   Chronic hyponatremia 02/01/2021   Colitis 01/29/2021   Past Medical History:  Past Medical History:  Diagnosis Date   Acid reflux    COPD (chronic obstructive pulmonary disease) (HCC)    Hypertension    Past Surgical History:  Past Surgical History:  Procedure Laterality Date   BIOPSY  10/17/2021   Procedure: BIOPSY;  Surgeon: Charlott Rakes, MD;  Location: [facility];  Service: Gastroenterology;;   COLONOSCOPY N/A 10/17/2021   Procedure: COLONOSCOPY;  Surgeon: Charlott Rakes, MD;  Location: [facility];  Service: Gastroenterology;  Laterality: N/A;   COLONOSCOPY WITH PROPOFOL N/A 10/18/2021   Procedure: COLONOSCOPY WITH PROPOFOL;  Surgeon: Kathi Der, MD;  Location: [facility];  Service: Gastroenterology;  Laterality: N/A;   ESOPHAGOGASTRODUODENOSCOPY N/A 10/17/2021    Procedure: ESOPHAGOGASTRODUODENOSCOPY (EGD);  Surgeon: Charlott Rakes, MD;  Location: [facility];  Service: Gastroenterology;  Laterality: N/A;   POLYPECTOMY  10/18/2021   Procedure: POLYPECTOMY;  Surgeon: Kathi Der, MD;  Location: [facility];  Service: Gastroenterology;;   HPI:  Grace Selles is a 88 y.o. female with medical history significant of  PAD, COPD, HTN, GERD, rectal prolapse and dementia presents to the emergency department with right-sided weakness Patient lives with daughter. Has an home health aide for 2 hrs day.  MRI Small acute perforator infarct in the left basal ganglia/overlying posterior left frontal posterior left frontal white matter.   Assessment / Plan / Recommendation Clinical Impression  Pt lives with daughter and has a 6th grade education. When SLP arrived pt was writing the date and current events on piece of paper. She reports her daughter manages the finances, appointments (but pt can recall when appointments are), and pt manages her own medications. Her speech and language are within normal range with adequate intelligibility, comprehension and expression. She was given the Cognistat and scored within the average range on all subtests assessed including memory, reasoning, calculations, attention and orientation and language. She may have difficulty attending if cognitive demands are more complex or in distracting environment. She appears functional in the acute setting and may benefit from home health ST. ST will not follow while in hospital.    SLP Assessment  SLP Recommendation/Assessment: Patient does not need any further Speech Lanaguage Pathology Services SLP Visit Diagnosis: Cognitive communication deficit (R41.841)    Recommendations for follow up therapy are one component of a multi-disciplinary discharge planning process, led by the attending physician.  Recommendations may be updated based on patient status, additional  functional criteria and  insurance authorization.    Follow Up Recommendations  No SLP follow up    Assistance Recommended at Discharge  None  Functional Status Assessment Patient has not had a recent decline in their functional status  Frequency and Duration           SLP Evaluation Cognition  Overall Cognitive Status: Within Functional Limits for tasks assessed Arousal/Alertness: Awake/alert Orientation Level: Oriented X4 Year: 2024 Month: May Day of Week: Correct Attention: Sustained Sustained Attention: Appears intact Memory: Impaired (recalled 3/4 words) Memory Impairment: Retrieval deficit Awareness: Appears intact Problem Solving: Appears intact (for verbal) Safety/Judgment: Appears intact       Comprehension  Auditory Comprehension Overall Auditory Comprehension: Appears within functional limits for tasks assessed Visual Recognition/Discrimination Discrimination: Not tested Reading Comprehension Reading Status: Not tested    Expression Expression Primary Mode of Expression: Verbal Verbal Expression Overall Verbal Expression: Appears within functional limits for tasks assessed Pragmatics: No impairment Written Expression Dominant Hand: Right Written Expression: Within Functional Limits   Oral / Motor  Oral Motor/Sensory Function Overall Oral Motor/Sensory Function: Within functional limits Motor Speech Overall Motor Speech: Appears within functional limits for tasks assessed Motor Planning: Witnin functional limits            Royce Macadamia 11/04/2022, 10:32 AM

## 2022-11-04 NOTE — Evaluation (Signed)
Occupational Therapy Evaluation Patient Details Name: Amy Mckay MRN: [id number] DOB: 01-Jul-1934 Today's Date: 11/04/2022   History of Present Illness Amy Mckay is a 88 y.o. female with medical history significant of  PAD, COPD, HTN, GERD, rectal prolapse and dementia presents to the emergency department with right-sided weakness Patient lives with daughter. Has an home health aide for 2 hrs day.  MRI Small acute perforator infarct in the left basal ganglia/overlying posterior left frontal posterior left frontal white matter.   Clinical Impression   Pt reports PTA, she was living at home with her daughter (who is w/c bound) and pt has an aid 2hrs per day to assist as needed. Pt reports she was independent with toileting, dressing and functional mobility at RW level. Pt currently requires minguard assistance for functional mobility at RW level. She demonstrates decreased activity tolerance, instability, cognitive limitations (with dementia unsure baseline) impacting independence and safety with ADL and functional mobility. Will continue to follow acutely and progress appropriately. Recommend HHOT follow-up with support from her daughter for setup assistance and support from granddaughter.      Recommendations for follow up therapy are one component of a multi-disciplinary discharge planning process, led by the attending physician.  Recommendations may be updated based on patient status, additional functional criteria and insurance authorization.   Assistance Recommended at Discharge Intermittent Supervision/Assistance  Patient can return home with the following A little help with walking and/or transfers;A little help with bathing/dressing/bathroom;Assistance with cooking/housework    Functional Status Assessment  Patient has had a recent decline in their functional status and demonstrates the ability to make significant improvements in function in a reasonable and predictable amount of  time.  Equipment Recommendations  None recommended by OT    Recommendations for Other Services       Precautions / Restrictions Precautions Precautions: Fall Restrictions Weight Bearing Restrictions: No      Mobility Bed Mobility Overal bed mobility: Needs Assistance Bed Mobility: Supine to Sit     Supine to sit: Min guard     General bed mobility comments: minguard for safety    Transfers Overall transfer level: Needs assistance Equipment used: Rolling walker (2 wheels) Transfers: Sit to/from Stand Sit to Stand: Min guard           General transfer comment: minguard for safety, cues for safe hand placement      Balance Overall balance assessment: Needs assistance Sitting-balance support: Feet supported Sitting balance-Leahy Scale: Fair     Standing balance support: Bilateral upper extremity supported Standing balance-Leahy Scale: Fair Standing balance comment: reliant on at least single UE support in standing                           ADL either performed or assessed with clinical judgement   ADL Overall ADL's : Needs assistance/impaired Eating/Feeding: Set up;Sitting   Grooming: Min guard;Standing   Upper Body Bathing: Set up;Sitting   Lower Body Bathing: Min guard;Sit to/from stand   Upper Body Dressing : Set up;Sitting   Lower Body Dressing: Min guard;Sit to/from stand   Toilet Transfer: Min guard;Ambulation Toilet Transfer Details (indicate cue type and reason): simulated in room Toileting- Clothing Manipulation and Hygiene: Min guard;Sit to/from stand       Functional mobility during ADLs: Min guard;Rolling walker (2 wheels) General ADL Comments: pt limited by pain in back, instability decreased standing tolerance/endurance     Vision         Perception  Praxis      Pertinent Vitals/Pain       Hand Dominance Right   Extremity/Trunk Assessment Upper Extremity Assessment Upper Extremity Assessment:  Generalized weakness;RUE deficits/detail RUE Deficits / Details: weaker than LUE, 4/5 RUE Sensation: decreased light touch RUE Coordination: decreased fine motor   Lower Extremity Assessment Lower Extremity Assessment: Defer to PT evaluation;RLE deficits/detail RLE Deficits / Details: reports decreased sensation in LLE   Cervical / Trunk Assessment Cervical / Trunk Assessment: Kyphotic   Communication Communication Communication: HOH   Cognition Arousal/Alertness: Awake/alert Behavior During Therapy: WFL for tasks assessed/performed                                   General Comments: per chart, pt with dementia at baseline, no family to present. Pt oriented x4. pt reports she does not drive anymore. She is highly distractable but easily redirected.     General Comments  SpO2 86% on RA, 92% on 2lnc    Exercises     Shoulder Instructions      Home Living Family/patient expects to be discharged to:: Private residence Living Arrangements: Children Available Help at Discharge: Personal care attendant Type of Home: House Home Access: Stairs to enter [employer] of Steps: 2 back, 8 front Entrance Stairs-Rails: Right Home Layout: Able to live on main level with bedroom/bathroom     Bathroom Shower/Tub: Chief Strategy Officer: Standard     Home Equipment: Rollator (4 wheels)      Lives With: Daughter (who is w/c bound)    Prior Functioning/Environment Prior Level of Function : Needs assist       Physical Assist : ADLs (physical)     Mobility Comments: reports use of RW and short distance mobility in the home. ADLs Comments: pt reports her aide was assisting her with "whatever I need" she reports she completes grooming while seated at the toilet, was independent with toileting and hygiene, limited standing tolerance. reports hre granddaughter is able to assist at baseline as well.        OT Problem List: Decreased activity  tolerance;Impaired balance (sitting and/or standing);Decreased safety awareness      OT Treatment/Interventions: Self-care/ADL training;Therapeutic exercise;Energy conservation;DME and/or AE instruction;Patient/family education;Balance training    OT Goals(Current goals can be found in the care plan section) Acute Rehab OT Goals Patient Stated Goal: to go home OT Goal Formulation: With patient Time For Goal Achievement: 11/18/22 Potential to Achieve Goals: Good ADL Goals Pt Will Perform Grooming: with modified independence;standing Pt Will Transfer to Toilet: with modified independence;ambulating Additional ADL Goal #1: Pt will demonstrate independence with 3 fall prevention strategies for safe engagement in ADL.  OT Frequency: Min 2X/week    Co-evaluation PT/OT/SLP Co-Evaluation/Treatment: Yes Reason for Co-Treatment: To address functional/ADL transfers;For patient/therapist safety   OT goals addressed during session: ADL's and self-care      AM-PAC OT "6 Clicks" Daily Activity     Outcome Measure Help from another person eating meals?: A Little Help from another person taking care of personal grooming?: A Little Help from another person toileting, which includes using toliet, bedpan, or urinal?: A Little Help from another person bathing (including washing, rinsing, drying)?: A Little Help from another person to put on and taking off regular upper body clothing?: A Little Help from another person to put on and taking off regular lower body clothing?: A Little 6 Click Score: 18  End of Session Equipment Utilized During Treatment: Gait belt;Rolling walker (2 wheels) Nurse Communication: Mobility status  Activity Tolerance: Patient tolerated treatment well Patient left: in chair;with call bell/phone within reach;with chair alarm set  OT Visit Diagnosis: Other abnormalities of gait and mobility (R26.89);Muscle weakness (generalized) (M62.81)                Time: 1610-9604 OT  Time Calculation (min): 39 min Charges:  OT General Charges $OT Visit: 1 Visit OT Evaluation $OT Eval Moderate Complexity: 1 Mod OT Treatments $Self Care/Home Management : 8-22 mins  Rosey Bath OTR/L Acute Rehabilitation Services Office: [phone]   Rebeca Alert 11/04/2022, 11:01 AM

## 2022-11-04 NOTE — Progress Notes (Signed)
Echocardiogram 2D Echocardiogram has been performed.  Amy Mckay 11/04/2022, 12:40 PM

## 2022-11-04 NOTE — Progress Notes (Signed)
PROGRESS NOTE  Tamber Gath ZOX:[id number] DOB: [birth year]   PCP: Annita Brod, MD  Patient is from: Home.  DOA: 11/03/2022 LOS: 1  Chief complaints Chief Complaint  Patient presents with   Numbness     Brief Narrative / Interim history: 88 y.o. female with medical history significant of dementia, PAD, COPD, HTN, GERD and rectal prolapse presented to ED with progressive right-sided weakness for about 2 days and found to have left basal ganglia infarct. MRI brain Small acute perforator infarct in the left basal ganglia/overlying posterior left frontal posterior left frontal white matter.  CT angio head and neck with no emergent LVO.  LE venous Doppler negative for DVT bilaterally.  TTE without significant finding.  A1c 6.3%.  LDL 134.  Patient was on aspirin and Plavix prior to this hospitalization.  Neurology recommended Brilinta and aspirin.   Hospital course complicated by hypotension that has improved with IV fluid.  Subjective: Seen and examined earlier this morning.  No major events overnight of this morning.  Complaining of pain in her bottom from rectal prolapse.  She also does not want to use bowel regimen or eat food to avoid bowel accident.   Objective: Vitals:   11/03/22 2032 11/03/22 2344 11/04/22 0439 11/04/22 0757  BP: (!) 120/106 100/77 104/84 98/76  Pulse: 100 94 91 97  Resp: 18 16 20 16   Temp: 98.4 F (36.9 C) 98.2 F (36.8 C) 97.8 F (36.6 C) 98.6 F (37 C)  TempSrc: Oral Oral Oral Oral  SpO2: 99% 97% 96% 93%  Weight: 55.2 kg     Height: 5\' 1"  (1.549 m)       Examination:  GENERAL: No apparent distress.  Nontoxic. HEENT: MMM.  Vision and hearing grossly intact.  NECK: Supple.  No apparent JVD.  RESP:  No IWOB.  Fair aeration bilaterally. CVS:  RRR. Heart sounds normal.  ABD/GI/GU: BS+. Abd soft, NTND.  MSK/EXT:  Moves extremities. No apparent deformity. No edema.  SKIN: no apparent skin lesion or wound NEURO: Awake, alert and oriented  appropriately.  Follows commands. Subtle RLE weakness PSYCH: Calm. Normal affect.   Procedures:  None  Microbiology summarized: None  Assessment and plan: Principal Problem:   Stroke [facility])  Acute left basal ganglia stroke: Presents with progressive right-sided weakness for 2 days. MRI brain Small acute perforator infarct in the left basal ganglia/overlying posterior left frontal posterior left frontal white matter.  CT angio head and neck without LVO.  TTE without significant finding.  UDS negative.  LDL 134.  A1c 6.3%.  Still with subtle RLE weakness.  Patient was on aspirin and Plavix before admission. -Neurology recommends aspirin and Brilinta -Continue statin. -PT/OT/SLP  Hypotension: Due to pain medication?  Improved after Trendelenburg.  She is not on antihypertensive meds.  TTE reassuring. -NS bolus 1000 cc at 250 cc an hour -Decrease pain medication.  Chronic pain syndrome: PDMP reviewed.  Firas 10 mg oxycodone, #120 and gabapentin 800 mg tablet #90 every month.  She is currently hypotensive. -Decrease oxycodone to 10 mg every 8 hours -Decrease gabapentin to 300 mg 3 times daily  Microcytic anemia due to chronic blood loss.  H&H stable. Recent Labs    09/10/22 1615 11/03/22 1136 11/03/22 1146  HGB 9.2* 9.5* 11.9*  -Check anemia panel in the morning   Hypothyroidism -Continue Synthroid   Leukocytosis: Likely demargination. -Recheck in the morning   Rectal prolapse, chronic stable -Continue rectal prolapse care   Dementia without behavior disturbance -Reorientation and delirium  PAD, chronic stable -Aspirin, Brilinta and statin as aboveprecaution -Continue home memantine   GERD, chronic, stable -Continue PPI  COPD, chronic stable -Nebs as needed  Body mass index is 22.99 kg/m.         DVT prophylaxis:  Place and maintain sequential compression device Start: 11/03/22 1717  Code Status: Full code Family Communication: Updated granddaughter,  Aundra Millet over the phone. Level of care: Telemetry Medical Status is: Inpatient Remains inpatient appropriate because: Acute stroke and hypertension   Final disposition: SNF? Consultants:  Neurology  55 minutes with more than 50% spent in reviewing records, counseling patient/family and coordinating care.   Sch Meds:  Scheduled Meds:  aspirin EC  81 mg Oral Daily   atorvastatin  40 mg Oral Daily   gabapentin  300 mg Oral TID   levothyroxine  75 mcg Oral Q0600   pantoprazole  40 mg Oral Daily   [START ON 11/05/2022] ticagrelor  90 mg Oral BID   Continuous Infusions:  sodium chloride     PRN Meds:.oxyCODONE, [COMPLETED] polyethylene glycol **FOLLOWED BY** polyethylene glycol, [COMPLETED] senna-docusate **FOLLOWED BY** senna-docusate  Antimicrobials: Anti-infectives (From admission, onward)    None        I have personally reviewed the following labs and images: CBC: Recent Labs  Lab 11/03/22 1136 11/03/22 1146  WBC 10.9*  --   NEUTROABS 6.7  --   HGB 9.5* 11.9*  HCT 31.7* 35.0*  MCV 77.9*  --   PLT 460*  --    BMP &GFR Recent Labs  Lab 11/03/22 1136 11/03/22 1146  NA 134* 136  K 4.2 4.4  CL 96* 94*  CO2 30  --   GLUCOSE 101* 97  BUN 19 20  CREATININE 0.85 0.90  CALCIUM 9.0  --    Estimated Creatinine Clearance: 33.9 mL/min (by C-G formula based on SCr of 0.9 mg/dL). Liver & Pancreas: Recent Labs  Lab 11/03/22 1136  AST 22  ALT 18  ALKPHOS 86  BILITOT 0.5  PROT 7.7  ALBUMIN 3.8   No results for input(s): "LIPASE", "AMYLASE" in the last 168 hours. No results for input(s): "AMMONIA" in the last 168 hours. Diabetic: Recent Labs    11/04/22 1017  HGBA1C 6.3*   No results for input(s): "GLUCAP" in the last 168 hours. Cardiac Enzymes: No results for input(s): "CKTOTAL", "CKMB", "CKMBINDEX", "TROPONINI" in the last 168 hours. No results for input(s): "PROBNP" in the last 8760 hours. Coagulation Profile: Recent Labs  Lab 11/03/22 1136  INR  0.9   Thyroid Function Tests: No results for input(s): "TSH", "T4TOTAL", "FREET4", "T3FREE", "THYROIDAB" in the last 72 hours. Lipid Profile: Recent Labs    11/04/22 1017  CHOL 203*  HDL 54  LDLCALC 134*  TRIG 76  CHOLHDL 3.8   Anemia Panel: No results for input(s): "VITAMINB12", "FOLATE", "FERRITIN", "TIBC", "IRON", "RETICCTPCT" in the last 72 hours. Urine analysis:    Component Value Date/Time   COLORURINE STRAW (A) 11/03/2022 1138   APPEARANCEUR CLEAR 11/03/2022 1138   LABSPEC 1.003 (L) 11/03/2022 1138   PHURINE 7.0 11/03/2022 1138   GLUCOSEU NEGATIVE 11/03/2022 1138   HGBUR MODERATE (A) 11/03/2022 1138   BILIRUBINUR NEGATIVE 11/03/2022 1138   KETONESUR NEGATIVE 11/03/2022 1138   PROTEINUR NEGATIVE 11/03/2022 1138   NITRITE NEGATIVE 11/03/2022 1138   LEUKOCYTESUR TRACE (A) 11/03/2022 1138   Sepsis Labs: Invalid input(s): "PROCALCITONIN", "LACTICIDVEN"  Microbiology: No results found for this or any previous visit (from the past 240 hour(s)).  Radiology Studies: ECHOCARDIOGRAM COMPLETE  Result Date: 11/04/2022    ECHOCARDIOGRAM REPORT   Patient Name:   CAROLANNE FRANCISCO Date of Exam: 11/04/2022 Medical Rec #:  [id number]     Height:       61.0 in Accession #:    [id number]    Weight:       121.7 lb Date of Birth:  27-Jun-1934     BSA:          1.529 m Patient Age:    88 years      BP:           98/76 mmHg Patient Gender: F             HR:           93 bpm. Exam Location:  Inpatient Procedure: 2D Echo, Cardiac Doppler and Color Doppler Indications:    Stroke I63.9  History:        Patient has no prior history of Echocardiogram examinations.                 Stroke and COPD; Risk Factors:Hypertension and Current Smoker.  Sonographer:    Lucendia Herrlich Referring Phys: [id number] J PATEL IMPRESSIONS  1. Left ventricular ejection fraction, by estimation, is 60 to 65%. The left ventricle has normal function. The left ventricle has no regional wall motion abnormalities. Left  ventricular diastolic parameters are consistent with Grade I diastolic dysfunction (impaired relaxation).  2. Right ventricular systolic function is normal. The right ventricular size is normal.  3. The mitral valve is normal in structure. No evidence of mitral valve regurgitation. No evidence of mitral stenosis. Moderate mitral annular calcification.  4. The aortic valve is normal in structure. Aortic valve regurgitation is not visualized. No aortic stenosis is present.  5. The inferior vena cava is normal in size with greater than 50% respiratory variability, suggesting right atrial pressure of 3 mmHg. Conclusion(s)/Recommendation(s): No intracardiac source of embolism detected on this transthoracic study. Consider a transesophageal echocardiogram to exclude cardiac source of embolism if clinically indicated. FINDINGS  Left Ventricle: Left ventricular ejection fraction, by estimation, is 60 to 65%. The left ventricle has normal function. The left ventricle has no regional wall motion abnormalities. The left ventricular internal cavity size was normal in size. There is  no left ventricular hypertrophy. Left ventricular diastolic parameters are consistent with Grade I diastolic dysfunction (impaired relaxation). Right Ventricle: The right ventricular size is normal. No increase in right ventricular wall thickness. Right ventricular systolic function is normal. Left Atrium: Left atrial size was normal in size. Right Atrium: Right atrial size was normal in size. Pericardium: There is no evidence of pericardial effusion. Mitral Valve: The mitral valve is normal in structure. Moderate mitral annular calcification. No evidence of mitral valve regurgitation. No evidence of mitral valve stenosis. Tricuspid Valve: The tricuspid valve is normal in structure. Tricuspid valve regurgitation is not demonstrated. No evidence of tricuspid stenosis. Aortic Valve: The aortic valve is normal in structure. Aortic valve regurgitation  is not visualized. No aortic stenosis is present. Aortic valve peak gradient measures 6.2 mmHg. Pulmonic Valve: The pulmonic valve was normal in structure. Pulmonic valve regurgitation is not visualized. No evidence of pulmonic stenosis. Aorta: The aortic root is normal in size and structure. Venous: The inferior vena cava is normal in size with greater than 50% respiratory variability, suggesting right atrial pressure of 3 mmHg. IAS/Shunts: No atrial level shunt detected by color flow Doppler.  LEFT VENTRICLE PLAX 2D LVIDd:  3.00 cm   Diastology LVIDs:         2.15 cm   LV e' medial:    5.55 cm/s LV PW:         1.00 cm   LV E/e' medial:  13.7 LV IVS:        0.90 cm   LV e' lateral:   9.36 cm/s LVOT diam:     1.70 cm   LV E/e' lateral: 8.1 LV SV:         39 LV SV Index:   25 LVOT Area:     2.27 cm  RIGHT VENTRICLE             IVC RV S prime:     12.50 cm/s  IVC diam: 1.50 cm TAPSE (M-mode): 2.0 cm LEFT ATRIUM             Index        RIGHT ATRIUM          Index LA diam:        2.30 cm 1.50 cm/m   RA Area:     9.31 cm LA Vol (A2C):   36.5 ml 23.87 ml/m  RA Volume:   15.60 ml 10.20 ml/m LA Vol (A4C):   22.5 ml 14.71 ml/m LA Biplane Vol: 29.2 ml 19.09 ml/m  AORTIC VALVE AV Area (Vmax): 1.72 cm AV Vmax:        124.00 cm/s AV Peak Grad:   6.2 mmHg LVOT Vmax:      93.70 cm/s LVOT Vmean:     64.800 cm/s LVOT VTI:       0.170 m  AORTA Ao Root diam: 3.40 cm Ao Asc diam:  3.40 cm MITRAL VALVE MV Area (PHT): 4.31 cm     SHUNTS MV Decel Time: 176 msec     Systemic VTI:  0.17 m MV E velocity: 75.90 cm/s   Systemic Diam: 1.70 cm MV A velocity: 138.00 cm/s MV E/A ratio:  0.55 Donato Schultz MD Electronically signed by Donato Schultz MD Signature Date/Time: 11/04/2022/1:25:03 PM    Final       Brodric Schauer T. Lenardo Westwood [facility]  If 7PM-7AM, please contact night-coverage [email or web address] 11/04/2022, 4:06 PM

## 2022-11-04 NOTE — Progress Notes (Signed)
Attempted to reach the Granddaughter, Meggan x 2. Per pt she is the one making the decisions.  Per MD the granddaughter is interested in SNF rehab. CM has asked PT to re-eval.

## 2022-11-04 NOTE — Progress Notes (Addendum)
STROKE TEAM PROGRESS NOTE   INTERVAL HISTORY Her granddaughter is at the bedside.  Patient is awake, laying in bed in no acute distress.  Patient's grand daughter at bedside states that patient's symptoms onset on Monday and fluctuated in severity with initial improvement and significant worsening of symptoms on Tuesday.  Patient is reportedly a poor historian due to dementia.  Per patient's granddaughter, patient lives with her daughter but family has been looking for some time at placement in a facility due to worsening dementia especially in the evenings.  Family is requesting facility placement.  Decreased strength and sensation of the right lower extremity  MRI scan of the brain showed left basal ganglia infarct.  CT angiogram shows 45 to 50% left proximal ICA stenosis.  Echocardiogram is pending Vitals:   11/03/22 2032 11/03/22 2344 11/04/22 0439 11/04/22 0757  BP: (!) 120/106 100/77 104/84 98/76  Pulse: 100 94 91 97  Resp: 18 16 20 16   Temp: 98.4 F (36.9 C) 98.2 F (36.8 C) 97.8 F (36.6 C) 98.6 F (37 C)  TempSrc: Oral Oral Oral Oral  SpO2: 99% 97% 96% 93%  Weight: 55.2 kg     Height: 5\' 1"  (1.549 m)      CBC:  Recent Labs  Lab 11/03/22 1136 11/03/22 1146  WBC 10.9*  --   NEUTROABS 6.7  --   HGB 9.5* 11.9*  HCT 31.7* 35.0*  MCV 77.9*  --   PLT 460*  --    Basic Metabolic Panel:  Recent Labs  Lab 11/03/22 1136 11/03/22 1146  NA 134* 136  K 4.2 4.4  CL 96* 94*  CO2 30  --   GLUCOSE 101* 97  BUN 19 20  CREATININE 0.85 0.90  CALCIUM 9.0  --    Lipid Panel: No results for input(s): "CHOL", "TRIG", "HDL", "CHOLHDL", "VLDL", "LDLCALC" in the last 168 hours. HgbA1c: No results for input(s): "HGBA1C" in the last 168 hours. Urine Drug Screen:  Recent Labs  Lab 11/03/22 1138  LABOPIA NONE DETECTED  COCAINSCRNUR NONE DETECTED  LABBENZ NONE DETECTED  AMPHETMU NONE DETECTED  THCU NONE DETECTED  LABBARB NONE DETECTED    Alcohol Level  Recent Labs  Lab  11/03/22 1136  ETH <10   IMAGING past 24 hours VAS [place] LOWER EXTREMITY VENOUS (DVT) (ONLY [facility] & [facility])  Result Date: 11/03/2022  Lower Venous DVT Study Patient Name:  GELSOMINA BAKKEN  Date of Exam:   11/03/2022 Medical Rec #: [id number]      Accession #:    [id number] Date of Birth: 15-Nov-1934      Patient Gender: F Patient Age:   88 years Exam Location:  [facility] Procedure:      VAS [place] LOWER EXTREMITY VENOUS (DVT) Referring Phys: Ernie Avena --------------------------------------------------------------------------------  Indications: Pain.  Risk Factors: None identified. Comparison Study: No prior studies. Performing Technologist: Chanda Busing RVT  Examination Guidelines: A complete evaluation includes B-mode imaging, spectral Doppler, color Doppler, and power Doppler as needed of all accessible portions of each vessel. Bilateral testing is considered an integral part of a complete examination. Limited examinations for reoccurring indications may be performed as noted. The reflux portion of the exam is performed with the patient in reverse Trendelenburg.  +-----+---------------+---------+-----------+----------+--------------+ RIGHTCompressibilityPhasicitySpontaneityPropertiesThrombus Aging +-----+---------------+---------+-----------+----------+--------------+ CFV  Full           Yes      Yes                                 +-----+---------------+---------+-----------+----------+--------------+   +---------+---------------+---------+-----------+----------+--------------+  LEFT     CompressibilityPhasicitySpontaneityPropertiesThrombus Aging +---------+---------------+---------+-----------+----------+--------------+ CFV      Full           Yes      Yes                                 +---------+---------------+---------+-----------+----------+--------------+ SFJ      Full                                                         +---------+---------------+---------+-----------+----------+--------------+ FV Prox  Full                                                        +---------+---------------+---------+-----------+----------+--------------+ FV Mid   Full                                                        +---------+---------------+---------+-----------+----------+--------------+ FV DistalFull                                                        +---------+---------------+---------+-----------+----------+--------------+ PFV      Full                                                        +---------+---------------+---------+-----------+----------+--------------+ POP      Full           Yes      Yes                                 +---------+---------------+---------+-----------+----------+--------------+ PTV      Full                                                        +---------+---------------+---------+-----------+----------+--------------+ PERO     Full                                                        +---------+---------------+---------+-----------+----------+--------------+    Summary: RIGHT: - No evidence of common femoral vein obstruction.  LEFT: - There is no evidence of deep vein thrombosis in the lower extremity.  - No cystic structure found in the popliteal fossa.  *See table(s) above for measurements and observations. Electronically  signed by Coral Else MD on 11/03/2022 at 7:16:16 PM.    Final    CT ANGIO HEAD NECK W WO CM  Result Date: 11/03/2022 CLINICAL DATA:  Stroke/TIA, determine embolic source EXAM: CT ANGIOGRAPHY HEAD AND NECK WITH AND WITHOUT CONTRAST TECHNIQUE: Multidetector CT imaging of the head and neck was performed using the standard protocol during bolus administration of intravenous contrast. Multiplanar CT image reconstructions and MIPs were obtained to evaluate the vascular anatomy. Carotid stenosis measurements (when applicable) are  obtained utilizing NASCET criteria, using the distal internal carotid diameter as the denominator. RADIATION DOSE REDUCTION: This exam was performed according to the departmental dose-optimization program which includes automated exposure control, adjustment of the mA and/or kV according to patient size and/or use of iterative reconstruction technique. CONTRAST:  OMNIPAQUE IOHEXOL 350 MG/ML SOLN COMPARISON:  MRI head from today. FINDINGS: CTA NECK FINDINGS Aortic arch: Visualized great vessel origins are patent. Aortic atherosclerosis. Right carotid system: Atherosclerosis at the carotid bifurcation without greater than 50% stenosis. Left carotid system: Atherosclerosis at the carotid bifurcation with approximately 45-50% stenosis. Vertebral arteries: Multifocal moderate stenosis of the right vertebral artery. Multifocal irregularity and mild narrowing of the left vertebral artery, likely due to atherosclerosis. Skeleton: Negative. Other neck: Negative. Upper chest: Emphysema.  Calcified granuloma in the left upper lobe. Review of the MIP images confirms the above findings CTA HEAD FINDINGS Anterior circulation: Bilateral intracranial ICAs, MCAs and ACAs are patent without proximal hemodynamically significant stenosis. Posterior circulation: Bilateral intradural vertebral arteries, basilar artery and posterior cerebral arteries are patent. Multifocal mild stenosis of the intradural vertebral arteries and moderate stenosis of the proximal basilar artery. Moderate right posterior cerebral artery stenosis. Venous sinuses: As permitted by contrast timing, patent. Review of the MIP images confirms the above findings IMPRESSION: CTA head: 1. No emergent large vessel occlusion. 2. 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 3. Moderate stenosis of the right PCA. CTA neck: 1. Approximately 45-50% stenosis of the left ICA origin. 2. Multifocal moderate right and mild left vertebral artery stenosis/irregularity.  3. Aortic Atherosclerosis (ICD10-I70.0) and Emphysema (ICD10-J43.9). Electronically Signed   By: Feliberto Harts M.D.   On: 11/03/2022 16:07   CT ABDOMEN PELVIS W CONTRAST  Result Date: 11/03/2022 CLINICAL DATA:  Abdominal pain, right-sided numbness and weakness since yesterday EXAM: CT ABDOMEN AND PELVIS WITH CONTRAST TECHNIQUE: Multidetector CT imaging of the abdomen and pelvis was performed using the standard protocol following bolus administration of intravenous contrast. Because of scanner malfunction, imaging was performed 3 minutes after contrast injection. RADIATION DOSE REDUCTION: This exam was performed according to the departmental dose-optimization program which includes automated exposure control, adjustment of the mA and/or kV according to patient size and/or use of iterative reconstruction technique. CONTRAST:  OMNIPAQUE IOHEXOL 350 MG/ML SOLN COMPARISON:  09/10/2022 FINDINGS: Lower chest: No acute pleural or parenchymal lung disease. Hepatobiliary: The liver is unremarkable. Gallbladder is fairly decompressed, with nonspecific areas of borderline gallbladder wall thickening measuring up to 4-5 mm. No evidence of cholelithiasis. No biliary duct dilation. Pancreas: Unremarkable. No pancreatic ductal dilatation or surrounding inflammatory changes. Spleen: Normal in size without focal abnormality. Adrenals/Urinary Tract: Excreted contrast within the kidneys, ureters, and bladder related to delayed imaging due to scanner malfunction. No filling defects. Kidneys appear unremarkable. The adrenals and bladder are unremarkable. Stomach/Bowel: No bowel obstruction or ileus. Moderate retained stool throughout the colon. Scattered colonic diverticulosis with no evidence of diverticulitis. Pelvic floor laxity, with pronounced rectal prolapse. Small hiatal hernia. Vascular/Lymphatic: Aortic atherosclerosis.  No enlarged abdominal or pelvic lymph nodes. Reproductive: Status post hysterectomy. No adnexal  masses. Other: No free fluid or free intraperitoneal gas. No abdominal wall hernia. Musculoskeletal: No acute or destructive bony abnormalities. Reconstructed images demonstrate no additional findings. IMPRESSION: 1. Pelvic floor laxity, with pronounced rectal prolapse. 2. Nonspecific gallbladder wall thickening given decompressed state of the gallbladder. No evidence of cholelithiasis. If further evaluation is clinically indicated, right upper quadrant ultrasound could be considered. 3. Moderate retained stool throughout the colon consistent with constipation. No bowel obstruction or ileus. 4. Scattered colonic diverticulosis without diverticulitis. 5.  Aortic Atherosclerosis (ICD10-I70.0). Electronically Signed   By: Sharlet Salina M.D.   On: 11/03/2022 15:56   MR BRAIN WO CONTRAST  Result Date: 11/03/2022 EXAM: MRI HEAD WITHOUT CONTRAST TECHNIQUE: Multiplanar, multiecho pulse sequences of the brain and surrounding structures were obtained without intravenous contrast. COMPARISON:  CT head from today. FINDINGS: Brain: Small acute perforator infarct in the left basal ganglia/overlying posterior left frontal white matter. Mild edema without mass effect. No evidence of acute hemorrhage, mass lesion, midline shift or hydrocephalus. Scattered T2/FLAIR hyperintensities within white matter, compatible with mild for age chronic microvascular ischemic change Vascular: Major arterial flow voids are maintained at the skull base. Skull and upper cervical spine: Normal marrow signal. Sinuses/Orbits: Clear sinuses.  No acute orbital findings. Other: No mastoid effusions. IMPRESSION: Small acute perforator infarct in the left basal ganglia/overlying posterior left frontal posterior left frontal white matter. Electronically Signed   By: Feliberto Harts M.D.   On: 11/03/2022 14:33   CT HEAD WO CONTRAST  Result Date: 11/03/2022 CLINICAL DATA:  Neuro deficit, acute, stroke suspected EXAM: CT HEAD WITHOUT CONTRAST  TECHNIQUE: Contiguous axial images were obtained from the base of the skull through the vertex without intravenous contrast. RADIATION DOSE REDUCTION: This exam was performed according to the departmental dose-optimization program which includes automated exposure control, adjustment of the mA and/or kV according to patient size and/or use of iterative reconstruction technique. COMPARISON:  CT Head Oct 13, 2021. FINDINGS: Brain: No evidence of acute infarction, hemorrhage, hydrocephalus, extra-axial collection or mass lesion/mass effect. Cerebral atrophy. Remote right thalamic and caudate infarcts. Vascular: No hyperdense vessel. Skull: No acute fracture. Sinuses/Orbits: Clear sinuses.  No acute orbital findings. Other: No mastoid effusions. IMPRESSION: No evidence of acute intracranial abnormality. Remote right thalamic and caudate infarcts. Cerebral atrophy (ICD10-G31.9). Electronically Signed   By: Feliberto Harts M.D.   On: 11/03/2022 11:58    PHYSICAL EXAM Constitutional: Elderly Caucasian lady appears well-developed and well-nourished.  Psych: Affect appropriate to situation, calm and cooperative with exam Eyes: No scleral injection HENT: No OP obstrucion MSK: no joint deformities or swelling Cardiovascular: Normal rate and regular rhythm.  Respiratory: Effort normal, non-labored breathing GI: Soft.  No distension. There is no tenderness.  Skin: WDI  Neuro: Mental Status: Patient is awake, alert, oriented to self, place.  She states the month correctly.  She is able to give some details regarding history of present illness though according to granddaughter at baseline, she does get some details wrong regarding the onset of symptoms and progression. Speech is fluent without dysarthria, no aphasia or neglect is noted. Cranial Nerves: II: Visual Fields are full. PERRL III,IV, VI: EOMI, tracks examiner throughout V: Facial sensation is intact and symmetric to light touch VII: Facial movement  is symmetric resting and with movement VIII: Hearing is intact to voice X: Palate elevates symmetrically XI: Shoulder shrug is symmetric. XII: Tongue protrudes midline Motor: Tone is normal.  Bulk is normal.  Minimal weakness of the right upper extremity compared to the left.  There is no vertical drift on bilateral upper extremity.  Movements are slowed bilaterally. Right lower extremity slightly weaker than the left lower extremity with minimal vertical drift of the right lower extremity. Sensory: Decrease sensation to light touch reported to the right lower extremity Cerebellar: Slowed but precise movements, no overt ataxia in right upper extremity.  Mild ataxia of the right lower extremity with HKS.  ASSESSMENT/PLAN Ms. Maxcine Seldin is a 88 y.o. female with history of COPD, HTN, PAD, GERD, tobacco smoker quit 2 weeks ago presenting with two days of right sided weakness with imaging evidence of a left basal ganglia infarction.   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 Remote right thalamic and caudate infarcts. Cerebral atrophy.  CTA head & neck  CTA head no LVO, moderate stenosis of the proximal basilar artery. Moderate stenosis of the right PCA. CTA neck: approximately 45-50% stenosis of the left ICA origin, multifocal moderate right and mild left vertebral artery stenosis/irregularity. Aortic atherosclerosis and emphysema.  MRI  Small acute perforator infarct in the left basal ganglia/overlying posterior left frontal posterior left frontal white matter 2D Echo LVEF 60-65% LDL 134 HgbA1c 6.3% VTE prophylaxis -SCDs    Diet   Diet regular Fluid consistency: Thin   aspirin 81 mg daily and clopidogrel 75 mg daily prior to admission, now on aspirin 81 mg daily and Brilinta (ticagrelor) 90 mg bid.  Therapy recommendations: Home health OT, home health PT Disposition: Pending  Hypertension Home meds:  None Stable Long-term BP goal normotensive  Hyperlipidemia Home meds: Atorvastatin 10 mg, resumed in hospital LDL 73, goal < 70 High intensity statin not indicated as patient is close to goal on current statin dose Continue statin at discharge  Prediabetes Home meds: None HgbA1c 6.3%, goal < 7.0 CBGs No results for input(s): "GLUCAP" in the last 72 hours.  Outpatient follow-up due to elevated A1c at 6.3% consistent with prediabetes  Other Stroke Risk Factors Advanced Age >/= 62  Cigarette smoker-reported smoking cessation approximately 2 weeks ago  Other Active Problems PAD on home DAPT s/p remote SMA stenting Cognitive impairment on home memantine  Hospital day # 1  Lanae Boast, AGACNP-BC [facility] Pager: [phone]  STROKE MD NOTE :  I have personally obtained history,examined this patient, reviewed notes, independently viewed imaging studies, participated in medical decision making and plan of care.ROS completed by me personally and pertinent positives fully documented  I have made any additions or clarifications directly to the above note. Agree with note above.  Patient presented with fluctuating right-sided weakness and MRI scan shows left basal ganglia infarct.  CT angiogram shows mild extracranial stenosis.  Recommend aspirin and Brilinta for at least 4 weeks and she can afford it then long-term since patient was already on aspirin and Plavix.  Check echocardiogram.  Aggressive risk factor modification.  Physical Occupational Therapy.  Continue Namenda for her baseline dementia.  Long discussion with the patient and granddaughter at the bedside and answered questions.  Greater than 50% time during this 50-minute visit was spent in counseling coordination of care about her lacunar stroke and discussion about evaluation and treatment and answering questions.  Delia Heady, MD Medical Director [facility] Pager: [phone] 11/04/2022 2:03  PM  To contact Stroke Continuity provider, please refer to [email or web address]. After hours, contact General Neurology

## 2022-11-04 NOTE — Evaluation (Signed)
Physical Therapy Evaluation Patient Details Name: Amy Mckay MRN: [id number] DOB: 12-08-1934 Today's Date: 11/04/2022  History of Present Illness  Amy Mckay is a 88 y.o. female who presented 11/03/22 with right-sided weakness. 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 PMH: PAD, COPD, HTN, GERD, rectal prolapse and dementia   Clinical Impression  Pt presents with condition above and deficits mentioned below, see PT Problem List. PTA, she was living at home with her daughter (who is w/c bound) and pt has an aid 2hrs per day to assist as needed. Pt was mod I with a rollator for functional mobility. Currently, pt demonstrates deficits in R lower extremity strength and coordination compared to her L side along with questionable sensation changes on her R side. She was able to perform all functional mobility using a RW at a min guard assist level without knee buckling or LOB this date, fatiguing after ambulating ~40 ft. Pt could benefit from further acute PT and HHPT follow-up to address her deficits above and increase her independence and safety with all functional mobility. Will continue to follow acutely.     Recommendations for follow up therapy are one component of a multi-disciplinary discharge planning process, led by the attending physician.  Recommendations may be updated based on patient status, additional functional criteria and insurance authorization.  Follow Up Recommendations       Assistance Recommended at Discharge Intermittent Supervision/Assistance  Patient can return home with the following  A little help with walking and/or transfers;A little help with bathing/dressing/bathroom;Assistance with cooking/housework;Assist for transportation;Help with stairs or ramp for entrance;Direct supervision/assist for financial management;Direct supervision/assist for medications management     Equipment Recommendations Other (comment) (shower chair)  Recommendations for Other Services       Functional Status Assessment Patient has had a recent decline in their functional status and demonstrates the ability to make significant improvements in function in a reasonable and predictable amount of time.     Precautions / Restrictions Precautions Precautions: Fall Restrictions Weight Bearing Restrictions: No      Mobility  Bed Mobility Overal bed mobility: Needs Assistance Bed Mobility: Supine to Sit     Supine to sit: Min guard, HOB elevated     General bed mobility comments: minguard for safety, HOB elevated    Transfers Overall transfer level: Needs assistance Equipment used: Rolling walker (2 wheels) Transfers: Sit to/from Stand Sit to Stand: Min guard           General transfer comment: minguard for safety, cues for safe hand placement but pt still pushing up on RW    Ambulation/Gait Ambulation/Gait assistance: Min guard, +2 safety/equipment Gait Distance (Feet): 40 Feet Assistive device: Rolling walker (2 wheels) Gait Pattern/deviations: Step-through pattern, Decreased stride length, Trunk flexed Gait velocity: reduced Gait velocity interpretation: <1.31 ft/sec, indicative of household ambulator   General Gait Details: Pt takes slow, small steps with a kyphotic posture, no LOB, min guard +2 for safety. No knee buckling noted. Pt fatigues quickly.  Stairs            Wheelchair Mobility    Modified Rankin (Stroke Patients Only) Modified Rankin (Stroke Patients Only) Pre-Morbid Rankin Score: Slight disability Modified Rankin: Moderately severe disability     Balance Overall balance assessment: Needs assistance Sitting-balance support: Feet supported Sitting balance-Leahy Scale: Fair     Standing balance support: Bilateral upper extremity supported Standing balance-Leahy Scale: Poor Standing balance comment: reliant on at  least single  UE support in standing                             Pertinent Vitals/Pain Pain Assessment Pain Assessment: No/denies pain    Home Living Family/patient expects to be discharged to:: Private residence Living Arrangements: Children Available Help at Discharge: Personal care attendant Type of Home: House Home Access: Stairs to enter Entrance Stairs-Rails: Right Entrance Stairs-Number of Steps: 2 back, 8 front   Home Layout: Able to live on main level with bedroom/bathroom Home Equipment: Rollator (4 wheels)      Prior Function Prior Level of Function : Needs assist       Physical Assist : ADLs (physical)     Mobility Comments: reports use of RW and short distance mobility in the home. ADLs Comments: pt reports her aide was assisting her with "whatever I need" she reports she completes grooming while seated at the toilet, was independent with toileting and hygiene, limited standing tolerance. reports hre granddaughter is able to assist at baseline as well.     Hand Dominance   Dominant Hand: Right    Extremity/Trunk Assessment   Upper Extremity Assessment Upper Extremity Assessment: Defer to OT evaluation RUE Deficits / Details: weaker than LUE, 4/5 RUE Sensation: decreased light touch RUE Coordination: decreased fine motor    Lower Extremity Assessment Lower Extremity Assessment: RLE deficits/detail RLE Deficits / Details: MMT scores of 4- to 4 grossly, weaker than L; reports chronic sensory deficits in R leg, but difficult to get pt to specify further without trailing off on her weakness; incoordination noted RLE Sensation: decreased light touch RLE Coordination: decreased gross motor;decreased fine motor    Cervical / Trunk Assessment Cervical / Trunk Assessment: Kyphotic  Communication   Communication: HOH  Cognition Arousal/Alertness: Awake/alert Behavior During Therapy: WFL for tasks assessed/performed Overall Cognitive Status: Within Functional  Limits for tasks assessed                                 General Comments: per chart, pt with dementia at baseline, no family to present. Pt oriented x4. pt reports she does not drive anymore. She is highly distractable but easily redirected.        General Comments General comments (skin integrity, edema, etc.): SpO2 86% on RA, 92% on 2lnc    Exercises     Assessment/Plan    PT Assessment Patient needs continued PT services  PT Problem List Decreased strength;Decreased activity tolerance;Decreased balance;Decreased mobility;Decreased coordination;Decreased cognition;Impaired sensation       PT Treatment Interventions DME instruction;Gait training;Stair training;Functional mobility training;Therapeutic activities;Therapeutic exercise;Balance training;Neuromuscular re-education;Patient/family education;Cognitive remediation    PT Goals (Current goals can be found in the Care Plan section)  Acute Rehab PT Goals Patient Stated Goal: to improve PT Goal Formulation: With patient Time For Goal Achievement: 11/18/22 Potential to Achieve Goals: Good    Frequency Min 4X/week     Co-evaluation PT/OT/SLP Co-Evaluation/Treatment: Yes Reason for Co-Treatment: To address functional/ADL transfers;For patient/therapist safety PT goals addressed during session: Mobility/safety with mobility;Balance;Proper use of DME OT goals addressed during session: ADL's and self-care       AM-PAC PT "6 Clicks" Mobility  Outcome Measure Help needed turning from your back to your side while in a flat bed without using bedrails?: A Little Help needed moving from lying on your back to sitting on the side of a  flat bed without using bedrails?: A Little Help needed moving to and from a bed to a chair (including a wheelchair)?: A Little Help needed standing up from a chair using your arms (e.g., wheelchair or bedside chair)?: A Little Help needed to walk in hospital room?: A Little Help  needed climbing 3-5 steps with a railing? : A Little 6 Click Score: 18    End of Session Equipment Utilized During Treatment: Gait belt;Oxygen Activity Tolerance: Patient tolerated treatment well Patient left: in chair;with call bell/phone within reach;with chair alarm set   PT Visit Diagnosis: Unsteadiness on feet (R26.81);Other abnormalities of gait and mobility (R26.89);Muscle weakness (generalized) (M62.81);Difficulty in walking, not elsewhere classified (R26.2);Other symptoms and signs involving the nervous system (R29.898)    Time: 1610-9604 PT Time Calculation (min) (ACUTE ONLY): 28 min   Charges:   PT Evaluation $PT Eval Moderate Complexity: 1 Mod          Raymond Gurney, PT, DPT Acute Rehabilitation Services  Office: [phone]   Jewel Baize 11/04/2022, 12:23 PM

## 2022-11-05 DIAGNOSIS — I6300 Cerebral infarction due to thrombosis of unspecified precerebral artery: Secondary | ICD-10-CM | POA: Diagnosis not present

## 2022-11-05 LAB — RENAL FUNCTION PANEL
Albumin: 2.9 g/dL — ABNORMAL LOW (ref 3.5–5.0)
Anion gap: 8 (ref 5–15)
BUN: 22 mg/dL (ref 8–23)
CO2: 28 mmol/L (ref 22–32)
Calcium: 8.4 mg/dL — ABNORMAL LOW (ref 8.9–10.3)
Chloride: 101 mmol/L (ref 98–111)
Creatinine, Ser: 1.14 mg/dL — ABNORMAL HIGH (ref 0.44–1.00)
GFR, Estimated: 47 mL/min — ABNORMAL LOW (ref >60)
Glucose, Bld: 104 mg/dL — ABNORMAL HIGH (ref 70–99)
Phosphorus: 4.7 mg/dL — ABNORMAL HIGH (ref 2.5–4.6)
Potassium: 3.8 mmol/L (ref 3.5–5.1)
Sodium: 137 mmol/L (ref 135–145)

## 2022-11-05 LAB — CBC
HCT: 28.8 % — ABNORMAL LOW (ref 36.0–46.0)
Hemoglobin: 8.7 g/dL — ABNORMAL LOW (ref 12.0–15.0)
MCH: 23 pg — ABNORMAL LOW (ref 26.0–34.0)
MCHC: 30.2 g/dL (ref 30.0–36.0)
MCV: 76.2 fL — ABNORMAL LOW (ref 80.0–100.0)
Platelets: 407 10*3/uL — ABNORMAL HIGH (ref 150–400)
RBC: 3.78 MIL/uL — ABNORMAL LOW (ref 3.87–5.11)
RDW: 15.8 % — ABNORMAL HIGH (ref 11.5–15.5)
WBC: 9.5 10*3/uL (ref 4.0–10.5)
nRBC: 0 % (ref 0.0–0.2)

## 2022-11-05 LAB — IRON AND TIBC
Iron: 22 ug/dL — ABNORMAL LOW (ref 28–170)
Saturation Ratios: 5 % — ABNORMAL LOW (ref 10.4–31.8)
TIBC: 489 ug/dL — ABNORMAL HIGH (ref 250–450)
UIBC: 467 ug/dL

## 2022-11-05 LAB — RETICULOCYTES
Immature Retic Fract: 26.8 % — ABNORMAL HIGH (ref 2.3–15.9)
RBC.: 3.7 MIL/uL — ABNORMAL LOW (ref 3.87–5.11)
Retic Count, Absolute: 62.5 10*3/uL (ref 19.0–186.0)
Retic Ct Pct: 1.7 % (ref 0.4–3.1)

## 2022-11-05 LAB — FERRITIN: Ferritin: 13 ng/mL (ref 11–307)

## 2022-11-05 LAB — MAGNESIUM: Magnesium: 2.1 mg/dL (ref 1.7–2.4)

## 2022-11-05 LAB — VITAMIN B12: Vitamin B-12: 479 pg/mL (ref 180–914)

## 2022-11-05 LAB — FOLATE: Folate: 12 ng/mL (ref >5.9)

## 2022-11-05 MED ORDER — SODIUM CHLORIDE 0.9 % IV SOLN
250.0000 mg | Freq: Every day | INTRAVENOUS | Status: AC
  Administered 2022-11-05 – 2022-11-06 (×2): 250 mg via INTRAVENOUS
  Filled 2022-11-05 (×2): qty 20

## 2022-11-05 MED ORDER — LIDOCAINE 5 % EX PTCH
1.0000 | MEDICATED_PATCH | CUTANEOUS | Status: DC
  Administered 2022-11-05 – 2022-11-11 (×7): 1 via TRANSDERMAL
  Filled 2022-11-05 (×7): qty 1

## 2022-11-05 MED ORDER — ACETAMINOPHEN 500 MG PO TABS
1000.0000 mg | ORAL_TABLET | Freq: Three times a day (TID) | ORAL | Status: DC
  Administered 2022-11-05 – 2022-11-12 (×21): 1000 mg via ORAL
  Filled 2022-11-05 (×21): qty 2

## 2022-11-05 MED ORDER — PRAMIPEXOLE DIHYDROCHLORIDE 0.25 MG PO TABS
0.2500 mg | ORAL_TABLET | Freq: Three times a day (TID) | ORAL | Status: DC
  Administered 2022-11-05 – 2022-11-12 (×20): 0.25 mg via ORAL
  Filled 2022-11-05 (×22): qty 1

## 2022-11-05 MED ORDER — OXYCODONE HCL 5 MG PO TABS
10.0000 mg | ORAL_TABLET | Freq: Four times a day (QID) | ORAL | Status: DC | PRN
  Administered 2022-11-05 – 2022-11-12 (×27): 10 mg via ORAL
  Filled 2022-11-05 (×27): qty 2

## 2022-11-05 MED ORDER — GABAPENTIN 300 MG PO CAPS
600.0000 mg | ORAL_CAPSULE | Freq: Three times a day (TID) | ORAL | Status: DC
  Administered 2022-11-05 – 2022-11-12 (×21): 600 mg via ORAL
  Filled 2022-11-05 (×21): qty 2

## 2022-11-05 MED ORDER — ALUM & MAG HYDROXIDE-SIMETH 200-200-20 MG/5ML PO SUSP
30.0000 mL | Freq: Three times a day (TID) | ORAL | Status: DC | PRN
  Administered 2022-11-06: 30 mL via ORAL
  Filled 2022-11-05: qty 30

## 2022-11-05 NOTE — NC FL2 (Signed)
[place] MEDICAID FL2 LEVEL OF CARE FORM     IDENTIFICATION  Patient Name: Amy Mckay Birthdate: [birth year] Sex: female Admission Date (Current Location): 11/03/2022  [facility] and [place] Number:  Producer, television/film/video and Address:  [facility] [place], [address], [place], [place] [address]      Provider Number: [id number]  Attending Physician Name and Address:  Almon Hercules, MD  Relative Name and Phone Number:       Current Level of Care: Hospital Recommended Level of Care: Skilled Nursing Facility Prior Approval Number:    Date Approved/Denied:   PASRR Number: [id number]  Discharge Plan: SNF    Current Diagnoses: Patient Active Problem List   Diagnosis Date Noted   Stroke (HCC) 11/03/2022   Heme positive stool 10/17/2021   Esophageal dysphagia 10/14/2021   Acute toxic encephalopathy 10/14/2021   Orthostatic hypotension 10/14/2021   Near syncope 10/13/2021   Iron deficiency anemia due to chronic blood loss 10/13/2021   Chronic respiratory failure with hypoxia (HCC) 10/13/2021   Chronic lower back pain 10/13/2021   Tobacco use disorder 10/13/2021   Pulmonary nodules/lesions, multiple 10/13/2021   Acute on chronic respiratory failure with hypoxia (HCC) 02/02/2021   COVID-19 virus infection 02/01/2021   AKI (acute kidney injury) (HCC) 02/01/2021   Peripheral arterial disease (HCC) 02/01/2021   Benign essential HTN 02/01/2021   COPD (chronic obstructive pulmonary disease) (HCC) 02/01/2021   Cigarette smoker 02/01/2021   Hypothyroidism 02/01/2021   Memory impairment 02/01/2021   Restless leg 02/01/2021   Chronic hyponatremia 02/01/2021   Colitis 01/29/2021    Orientation RESPIRATION BLADDER Height & Weight     Self, Time, Situation, Place  Normal Incontinent Weight: 121 lb 11.1 oz (55.2 kg) Height:  5\' 1"  (154.9 cm)  BEHAVIORAL SYMPTOMS/MOOD NEUROLOGICAL BOWEL NUTRITION STATUS      Incontinent Diet (regular)  AMBULATORY STATUS  COMMUNICATION OF NEEDS Skin   Limited Assist Verbally Normal                       Personal Care Assistance Level of Assistance  Bathing, Feeding, Dressing Bathing Assistance: Limited assistance Feeding assistance: Limited assistance Dressing Assistance: Limited assistance     Functional Limitations Info  Sight, Hearing Sight Info: Impaired Hearing Info: Impaired      SPECIAL CARE FACTORS FREQUENCY  PT (By licensed PT), OT (By licensed OT)     PT Frequency: 5x/wk OT Frequency: 5x/wk            Contractures Contractures Info: Not present    Additional Factors Info  Code Status, Allergies Code Status Info: Full Allergies Info: Penicillins, Penicillin G           Current Medications (11/05/2022):  This is the current hospital active medication list Current Facility-Administered Medications  Medication Dose Route Frequency Provider Last Rate Last Admin   acetaminophen (TYLENOL) tablet 1,000 mg  1,000 mg Oral Q8H Gonfa, Taye T, MD       aspirin EC tablet 81 mg  81 mg Oral Daily Sherlyn Lick, MD   81 mg at 11/05/22 0835   atorvastatin (LIPITOR) tablet 40 mg  40 mg Oral Daily Sherlyn Lick, MD   40 mg at 11/05/22 0835   ferric gluconate (FERRLECIT) 250 mg in sodium chloride 0.9 % 250 mL IVPB  250 mg Intravenous Daily Gonfa, Taye T, MD       gabapentin (NEURONTIN) capsule 600 mg  600 mg Oral TID Gonfa,  Boyce Medici, MD       levothyroxine (SYNTHROID) tablet 75 mcg  75 mcg Oral Q0600 Sherlyn Lick, MD   75 mcg at 11/05/22 0506   ondansetron [facility]) injection 4 mg  4 mg Intravenous Q6H PRN Candelaria Stagers T, MD   4 mg at 11/04/22 1850   oxyCODONE (Oxy IR/ROXICODONE) immediate release tablet 10 mg  10 mg Oral Q6H PRN Almon Hercules, MD       pantoprazole (PROTONIX) EC tablet 40 mg  40 mg Oral Daily Sherlyn Lick, MD   40 mg at 11/05/22 0835   polyethylene glycol (MIRALAX / GLYCOLAX) packet 17 g  17 g Oral BID PRN Gonfa, Taye T, MD       senna-docusate  (Senokot-S) tablet 1 tablet  1 tablet Oral BID PRN Almon Hercules, MD       ticagrelor (BRILINTA) tablet 90 mg  90 mg Oral BID Candelaria Stagers T, MD   90 mg at 11/05/22 4098     Discharge Medications: Please see discharge summary for a list of discharge medications.  Relevant Imaging Results:  Relevant Lab Results:   Additional Information SS#: [id number]  Baldemar Lenis, LCSW

## 2022-11-05 NOTE — Progress Notes (Signed)
Physical Therapy Treatment Patient Details Name: Amy Mckay MRN: [id number] DOB: [birth year] Today's Date: 11/05/2022   History of Present Illness Amy Mckay is a 88 y.o. female who presented 11/03/22 with right-sided weakness. 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 PMH: PAD, COPD, HTN, GERD, rectal prolapse and dementia    PT Comments    Focused session on gait and balance training. Pt challenged her balance by reaching off BOS and across midline in sitting and standing while performing functional tasks, like pericare. Pt displays balance deficits that place her at risk for falls and result in her only being able to reach mildly off BOS without UE support in standing. She benefits from at least 1 UE support for more difficult dynamic standing tasks/mobility. She also fatigues quickly, only ambulating up to ~22 ft at a time today. Family has reported to other members of this pt's healthcare team that they would prefer pt get some inpatient rehab, <3 hours/day, to maximize her strength and independence prior to returning home, which she definitely could benefit from. Will continue to follow acutely.     Recommendations for follow up therapy are one component of a multi-disciplinary discharge planning process, led by the attending physician.  Recommendations may be updated based on patient status, additional functional criteria and insurance authorization.  Follow Up Recommendations  Can patient physically be transported by private vehicle: Yes    Assistance Recommended at Discharge Intermittent Supervision/Assistance  Patient can return home with the following A little help with walking and/or transfers;A little help with bathing/dressing/bathroom;Assistance with cooking/housework;Assist for transportation;Help with stairs or ramp for entrance;Direct supervision/assist for financial  management;Direct supervision/assist for medications management   Equipment Recommendations  Other (comment) (shower chair)    Recommendations for Other Services       Precautions / Restrictions Precautions Precautions: Fall Restrictions Weight Bearing Restrictions: No     Mobility  Bed Mobility Overal bed mobility: Needs Assistance Bed Mobility: Supine to Sit, Sit to Supine     Supine to sit: Min guard, HOB elevated Sit to supine: Min guard, HOB elevated   General bed mobility comments: minguard for safety, HOB elevated    Transfers Overall transfer level: Needs assistance Equipment used: Rolling walker (2 wheels) Transfers: Sit to/from Stand Sit to Stand: Min guard           General transfer comment: minguard for safety, cues for safe hand placement but pt still pushing up on RW coming to stand from EOB 1x and toilet 1x    Ambulation/Gait Ambulation/Gait assistance: Min guard Gait Distance (Feet): 22 Feet (x2 bouts of ~22 ft each bout) Assistive device: Rolling walker (2 wheels) Gait Pattern/deviations: Step-through pattern, Decreased stride length, Trunk flexed Gait velocity: reduced Gait velocity interpretation: <1.31 ft/sec, indicative of household ambulator   General Gait Details: Pt takes slow, small steps with a kyphotic posture, no LOB, min guard for safety. No knee buckling noted. Pt fatigues quickly with DOE, SpO2 92% supine on RA after ambulating.   Stairs             Wheelchair Mobility    Modified Rankin (Stroke Patients Only) Modified Rankin (Stroke Patients Only) Pre-Morbid Rankin Score: Slight disability Modified Rankin: Moderately severe disability     Balance Overall balance assessment: Needs assistance Sitting-balance support: Feet supported Sitting balance-Leahy Scale: Fair     Standing balance support: Bilateral upper extremity supported, No upper extremity supported, Single extremity supported Standing  balance-Leahy  Scale: Fair Standing balance comment: Able to stand without UE support and reach mildly off BOS without LOB but tends to prefer at least 1 UE support for increased dynamic balance challenges and to ambulate                            Cognition Arousal/Alertness: Awake/alert Behavior During Therapy: WFL for tasks assessed/performed Overall Cognitive Status: Within Functional Limits for tasks assessed                                 General Comments: per chart, pt with dementia at baseline, no family to present. She is highly distractable but easily redirected.        Exercises Other Exercises Other Exercises: dynamic balance challenged in sitting and standing havign pt reach off BOS and across midline to manage clothing and perform pericare in bathroom    General Comments General comments (skin integrity, edema, etc.): SpO2 92% on RA at rest supine after ambulating, DOE noted      Pertinent Vitals/Pain Pain Assessment Pain Assessment: Faces Faces Pain Scale: Hurts a little bit Pain Location: generalized, with DOE Pain Descriptors / Indicators: Discomfort Pain Intervention(s): Limited activity within patient's tolerance, Monitored during session    Home Living                          Prior Function            PT Goals (current goals can now be found in the care plan section) Acute Rehab PT Goals Patient Stated Goal: to improve PT Goal Formulation: With patient Time For Goal Achievement: 11/18/22 Potential to Achieve Goals: Good Progress towards PT goals: Progressing toward goals    Frequency    Min 3X/week      PT Plan Discharge plan needs to be updated;Frequency needs to be updated    Co-evaluation              AM-PAC PT "6 Clicks" Mobility   Outcome Measure  Help needed turning from your back to your side while in a flat bed without using bedrails?: A Little Help needed moving from lying on your back to sitting on  the side of a flat bed without using bedrails?: A Little Help needed moving to and from a bed to a chair (including a wheelchair)?: A Little Help needed standing up from a chair using your arms (e.g., wheelchair or bedside chair)?: A Little Help needed to walk in hospital room?: A Little Help needed climbing 3-5 steps with a railing? : A Little 6 Click Score: 18    End of Session Equipment Utilized During Treatment: Gait belt;Oxygen Activity Tolerance: Patient tolerated treatment well Patient left: with call bell/phone within reach;in bed;with bed alarm set   PT Visit Diagnosis: Unsteadiness on feet (R26.81);Other abnormalities of gait and mobility (R26.89);Muscle weakness (generalized) (M62.81);Difficulty in walking, not elsewhere classified (R26.2);Other symptoms and signs involving the nervous system (R29.898)     Time: 1610-9604 PT Time Calculation (min) (ACUTE ONLY): 36 min  Charges:  $Gait Training: 8-22 mins $Neuromuscular Re-education: 8-22 mins                     Raymond Gurney, PT, DPT Acute Rehabilitation Services  Office: [phone]    Jewel Baize 11/05/2022, 3:42 PM

## 2022-11-05 NOTE — Plan of Care (Signed)

## 2022-11-05 NOTE — TOC Initial Note (Signed)
Transition of Care [facility]) - Initial/Assessment Note    Patient Details  Name: Amy Mckay MRN: [id number] Date of Birth: 05-17-1935  Transition of Care [facility]) CM/SW Contact:    Baldemar Lenis, LCSW Phone Number: 11/05/2022, 10:32 AM  Clinical Narrative:        CSW spoke with granddaughter, Aundra Millet, about placement for patient. Per granddaughter, family is not able to be with the patient 24/7 and they are concerned about her going home, asking for rehab to improve functioning after her stroke. CSW discussed process for completing referral, will follow up with bed offers.           Expected Discharge Plan: Skilled Nursing Facility Barriers to Discharge: Continued Medical Work up, English as a second language teacher   Patient Goals and CMS Choice Patient states their goals for this hospitalization and ongoing recovery are:: to improve CMS Medicare.gov Compare Post Acute Care list provided to:: Patient Represenative (must comment) Choice offered to / list presented to : Adult Children [place] ownership interest in [facility].provided to:: Adult Children    Expected Discharge Plan and Services     Post Acute Care Choice: Skilled Nursing Facility Living arrangements for the past 2 months: Single Family Home                                      Prior Living Arrangements/Services Living arrangements for the past 2 months: Single Family Home Lives with:: Adult Children Patient language and need for interpreter reviewed:: No Do you feel safe going back to the place where you live?: Yes      Need for Family Participation in Patient Care: Yes (Comment) Care giver support system in place?: No (comment) Current home services: DME, Homehealth aide Criminal Activity/Legal Involvement Pertinent to Current Situation/Hospitalization: No - Comment as needed  Activities of Daily Living Home Assistive Devices/Equipment: Cane (specify quad or straight), Walker (specify type) ADL  Screening (condition at time of admission) Patient's cognitive ability adequate to safely complete daily activities?: Yes Is the patient deaf or have difficulty hearing?: Yes Does the patient have difficulty seeing, even when wearing glasses/contacts?: No Does the patient have difficulty concentrating, remembering, or making decisions?: Yes Patient able to express need for assistance with ADLs?: Yes Does the patient have difficulty dressing or bathing?: Yes Independently performs ADLs?: No Communication: Independent Dressing (OT): Needs assistance Is this a change from baseline?: Pre-admission baseline Grooming: Needs assistance Is this a change from baseline?: Pre-admission baseline Feeding: Needs assistance Is this a change from baseline?: Pre-admission baseline Bathing: Needs assistance Is this a change from baseline?: Pre-admission baseline Toileting: Needs assistance Is this a change from baseline?: Pre-admission baseline In/Out Bed: Needs assistance Is this a change from baseline?: Pre-admission baseline Walks in Home: Needs assistance Is this a change from baseline?: Pre-admission baseline Does the patient have difficulty walking or climbing stairs?: Yes Weakness of Legs: Both Weakness of Arms/Hands: Both  Permission Sought/Granted Permission sought to share information with : Facility Medical sales representative, Family Supports Permission granted to share information with : Yes, Verbal Permission Granted  Share Information with NAME: Aundra Millet  Permission granted to share info w AGENCY: SNF  Permission granted to share info w Relationship: Granddaughter     Emotional Assessment Appearance:: Appears stated age Attitude/Demeanor/Rapport: Engaged Affect (typically observed): Appropriate Orientation: : Oriented to Self, Oriented to Place, Oriented to  Time, Oriented to Situation Alcohol / Substance Use: Not Applicable  Psych Involvement: No (comment)  Admission diagnosis:   Numbness [R20.0] Weakness [R53.1] Stroke [facility]) [I63.9] Cerebrovascular accident (CVA), unspecified mechanism (HCC) [I63.9] Patient Active Problem List   Diagnosis Date Noted   Stroke (HCC) 11/03/2022   Heme positive stool 10/17/2021   Esophageal dysphagia 10/14/2021   Acute toxic encephalopathy 10/14/2021   Orthostatic hypotension 10/14/2021   Near syncope 10/13/2021   Iron deficiency anemia due to chronic blood loss 10/13/2021   Chronic respiratory failure with hypoxia (HCC) 10/13/2021   Chronic lower back pain 10/13/2021   Tobacco use disorder 10/13/2021   Pulmonary nodules/lesions, multiple 10/13/2021   Acute on chronic respiratory failure with hypoxia (HCC) 02/02/2021   COVID-19 virus infection 02/01/2021   AKI (acute kidney injury) (HCC) 02/01/2021   Peripheral arterial disease (HCC) 02/01/2021   Benign essential HTN 02/01/2021   COPD (chronic obstructive pulmonary disease) (HCC) 02/01/2021   Cigarette smoker 02/01/2021   Hypothyroidism 02/01/2021   Memory impairment 02/01/2021   Restless leg 02/01/2021   Chronic hyponatremia 02/01/2021   Colitis 01/29/2021   PCP:  Annita Brod, MD Pharmacy:   [facility] - [place], [place] - [address] [place] [address] Phone: [phone] Fax: [phone]     Social Determinants of Health (SDOH) Social History: SDOH Screenings   Food Insecurity: No Food Insecurity (11/03/2022)  Housing: Low Risk  (11/03/2022)  Transportation Needs: No Transportation Needs (11/03/2022)  Utilities: Not At Risk (11/03/2022)  Tobacco Use: High Risk (11/03/2022)   SDOH Interventions:     Readmission Risk Interventions     No data to display

## 2022-11-05 NOTE — Progress Notes (Signed)
PROGRESS NOTE  Destene Barten MVH:[id number] DOB: [birth year]   PCP: Annita Brod, MD  Patient is from: Home.  DOA: 11/03/2022 LOS: 2  Chief complaints Chief Complaint  Patient presents with   Numbness     Brief Narrative / Interim history: 88 y.o. female with medical history significant of dementia, PAD, COPD, HTN, GERD and rectal prolapse presented to ED with progressive right-sided weakness for about 2 days and found to have left basal ganglia infarct. MRI brain Small acute perforator infarct in the left basal ganglia/overlying posterior left frontal posterior left frontal white matter.  CT angio head and neck with no emergent LVO.  LE venous Doppler negative for DVT bilaterally.  TTE without significant finding.  A1c 6.3%.  LDL 134.  Patient was on aspirin and Plavix prior to this hospitalization.  Neurology recommended Brilinta and aspirin.   Hospital course complicated by hypotension that has improved with IV fluid.  Therapy recommended SNF.  Subjective: Seen and examined earlier this morning.  No major events overnight of this morning.  Complains of back pain and hip pain.  Hip pain med was reduced yesterday due to hypotension.  She also reports some acid reflux.  Objective: Vitals:   11/05/22 0300 11/05/22 0753 11/05/22 1201 11/05/22 1202  BP: 92/66 104/68  117/83  Pulse: 86 86 93 93  Resp: 16 16  17   Temp: 98.4 F (36.9 C) 98.4 F (36.9 C) 98.8 F (37.1 C) 98.8 F (37.1 C)  TempSrc: Oral Oral Oral Oral  SpO2: 95% 95%  95%  Weight:      Height:        Examination:   GENERAL: No apparent distress.  Nontoxic. HEENT: MMM.  Vision and hearing grossly intact.  NECK: Supple.  No apparent JVD.  RESP:  No IWOB.  Fair aeration bilaterally. CVS:  RRR. Heart sounds normal.  ABD/GI/GU: BS+. Abd soft, NTND.  MSK/EXT:   No apparent deformity. Moves extremities. No edema.  SKIN: no apparent skin lesion or wound NEURO: Awake and alert. Oriented fairly.  No apparent focal  neuro deficit. PSYCH: Calm. Normal affect.   Procedures:  None  Microbiology summarized: None  Assessment and plan: Principal Problem:   Stroke [facility])  Acute left basal ganglia stroke: Presents with progressive right-sided weakness for 2 days. MRI brain Small acute perforator infarct in the left basal ganglia/overlying posterior left frontal posterior left frontal white matter.  CT angio head and neck without LVO.  TTE without significant finding.  UDS negative.  LDL 134.  A1c 6.3%.  Still with subtle RLE weakness.  Patient was on aspirin and Plavix before admission.  Per family, compliant with meds. -Neurology recommends aspirin and Brilinta -Continue statin. -PT/OT recommended SNF.  Hypotension: Episode of hypotension on 5/22.  Not on antihypertensive meds.  Hypokalemia resolved with IV fluid.  TTE reassuring. -Encourage oral hydration.  Chronic pain syndrome: PDMP reviewed.  Fills 10 mg oxycodone, #120 and gabapentin 800 mg tablet #90 every month.  -Continue home oxycodone -Continue gabapentin at 600 mg 3 times daily  Microcytic iron deficiency anemia due to chronic blood loss.  H&H relatively stable. Recent Labs    09/10/22 1615 11/03/22 1136 11/03/22 1146 11/05/22 0437  HGB 9.2* 9.5* 11.9* 8.7*  -IV ferric gluconate 250 mg daily x 2 -Monitor H&H   Hypothyroidism -Continue Synthroid   Leukocytosis: Likely demargination.  Resolved.   Rectal prolapse, chronic stable -Continue rectal prolapse care -Consider outpatient follow-up with colorectal surgery   Dementia without behavior disturbance: Stable. -  Reorientation and delirium   PAD, chronic stable -Aspirin, Brilinta and statin as aboveprecaution -Continue home memantine   GERD: Reports reflux. -Continue Protonix 40 mg daily -GI cocktail as needed  COPD, chronic stable -Nebs as needed  Body mass index is 22.99 kg/m.         DVT prophylaxis:  Place and maintain sequential compression device Start:  11/03/22 1717  Code Status: Full code Family Communication: Updated granddaughter, Aundra Millet over the phone on 5/22.. Level of care: Telemetry Medical Status is: Inpatient Remains inpatient appropriate because: Acute stroke and hypertension   Final disposition: SNF Consultants:  Neurology  55 minutes with more than 50% spent in reviewing records, counseling patient/family and coordinating care.   Sch Meds:  Scheduled Meds:  acetaminophen  1,000 mg Oral Q8H   aspirin EC  81 mg Oral Daily   atorvastatin  40 mg Oral Daily   gabapentin  600 mg Oral TID   levothyroxine  75 mcg Oral Q0600   pantoprazole  40 mg Oral Daily   ticagrelor  90 mg Oral BID   Continuous Infusions:  ferric gluconate (FERRLECIT) IVPB 250 mg (11/05/22 1041)   PRN Meds:.ondansetron (ZOFRAN) IV, oxyCODONE, [COMPLETED] polyethylene glycol **FOLLOWED BY** polyethylene glycol, [COMPLETED] senna-docusate **FOLLOWED BY** senna-docusate  Antimicrobials: Anti-infectives (From admission, onward)    None        I have personally reviewed the following labs and images: CBC: Recent Labs  Lab 11/03/22 1136 11/03/22 1146 11/05/22 0437  WBC 10.9*  --  9.5  NEUTROABS 6.7  --   --   HGB 9.5* 11.9* 8.7*  HCT 31.7* 35.0* 28.8*  MCV 77.9*  --  76.2*  PLT 460*  --  407*   BMP &GFR Recent Labs  Lab 11/03/22 1136 11/03/22 1146 11/05/22 0437  NA 134* 136 137  K 4.2 4.4 3.8  CL 96* 94* 101  CO2 30  --  28  GLUCOSE 101* 97 104*  BUN 19 20 22   CREATININE 0.85 0.90 1.14*  CALCIUM 9.0  --  8.4*  MG  --   --  2.1  PHOS  --   --  4.7*   Estimated Creatinine Clearance: 26.7 mL/min (A) (by C-G formula based on SCr of 1.14 mg/dL (H)). Liver & Pancreas: Recent Labs  Lab 11/03/22 1136 11/05/22 0437  AST 22  --   ALT 18  --   ALKPHOS 86  --   BILITOT 0.5  --   PROT 7.7  --   ALBUMIN 3.8 2.9*   No results for input(s): "LIPASE", "AMYLASE" in the last 168 hours. No results for input(s): "AMMONIA" in the last  168 hours. Diabetic: Recent Labs    11/04/22 1017  HGBA1C 6.3*   No results for input(s): "GLUCAP" in the last 168 hours. Cardiac Enzymes: No results for input(s): "CKTOTAL", "CKMB", "CKMBINDEX", "TROPONINI" in the last 168 hours. No results for input(s): "PROBNP" in the last 8760 hours. Coagulation Profile: Recent Labs  Lab 11/03/22 1136  INR 0.9   Thyroid Function Tests: No results for input(s): "TSH", "T4TOTAL", "FREET4", "T3FREE", "THYROIDAB" in the last 72 hours. Lipid Profile: Recent Labs    11/04/22 1017  CHOL 203*  HDL 54  LDLCALC 134*  TRIG 76  CHOLHDL 3.8   Anemia Panel: Recent Labs    11/05/22 0437  VITAMINB12 479  FOLATE 12.0  FERRITIN 13  TIBC 489*  IRON 22*  RETICCTPCT 1.7   Urine analysis:    Component Value Date/Time   COLORURINE  STRAW (A) 11/03/2022 1138   APPEARANCEUR CLEAR 11/03/2022 1138   LABSPEC 1.003 (L) 11/03/2022 1138   PHURINE 7.0 11/03/2022 1138   GLUCOSEU NEGATIVE 11/03/2022 1138   HGBUR MODERATE (A) 11/03/2022 1138   BILIRUBINUR NEGATIVE 11/03/2022 1138   KETONESUR NEGATIVE 11/03/2022 1138   PROTEINUR NEGATIVE 11/03/2022 1138   NITRITE NEGATIVE 11/03/2022 1138   LEUKOCYTESUR TRACE (A) 11/03/2022 1138   Sepsis Labs: Invalid input(s): "PROCALCITONIN", "LACTICIDVEN"  Microbiology: No results found for this or any previous visit (from the past 240 hour(s)).  Radiology Studies: No results found.    Delan Ksiazek T. Aloma Boch [facility]  If 7PM-7AM, please contact night-coverage [email or web address] 11/05/2022, 12:44 PM

## 2022-11-06 DIAGNOSIS — D509 Iron deficiency anemia, unspecified: Secondary | ICD-10-CM

## 2022-11-06 DIAGNOSIS — I639 Cerebral infarction, unspecified: Secondary | ICD-10-CM

## 2022-11-06 DIAGNOSIS — G8929 Other chronic pain: Secondary | ICD-10-CM

## 2022-11-06 DIAGNOSIS — M545 Low back pain, unspecified: Secondary | ICD-10-CM | POA: Diagnosis not present

## 2022-11-06 DIAGNOSIS — I959 Hypotension, unspecified: Secondary | ICD-10-CM

## 2022-11-06 LAB — CBC
HCT: 29.4 % — ABNORMAL LOW (ref 36.0–46.0)
Hemoglobin: 8.9 g/dL — ABNORMAL LOW (ref 12.0–15.0)
MCH: 23.2 pg — ABNORMAL LOW (ref 26.0–34.0)
MCHC: 30.3 g/dL (ref 30.0–36.0)
MCV: 76.8 fL — ABNORMAL LOW (ref 80.0–100.0)
Platelets: 405 10*3/uL — ABNORMAL HIGH (ref 150–400)
RBC: 3.83 MIL/uL — ABNORMAL LOW (ref 3.87–5.11)
RDW: 15.6 % — ABNORMAL HIGH (ref 11.5–15.5)
WBC: 8.6 10*3/uL (ref 4.0–10.5)
nRBC: 0 % (ref 0.0–0.2)

## 2022-11-06 LAB — RENAL FUNCTION PANEL
Albumin: 3.1 g/dL — ABNORMAL LOW (ref 3.5–5.0)
Anion gap: 9 (ref 5–15)
BUN: 17 mg/dL (ref 8–23)
CO2: 27 mmol/L (ref 22–32)
Calcium: 8.7 mg/dL — ABNORMAL LOW (ref 8.9–10.3)
Chloride: 98 mmol/L (ref 98–111)
Creatinine, Ser: 1.12 mg/dL — ABNORMAL HIGH (ref 0.44–1.00)
GFR, Estimated: 48 mL/min — ABNORMAL LOW (ref >60)
Glucose, Bld: 96 mg/dL (ref 70–99)
Phosphorus: 4.3 mg/dL (ref 2.5–4.6)
Potassium: 3.8 mmol/L (ref 3.5–5.1)
Sodium: 134 mmol/L — ABNORMAL LOW (ref 135–145)

## 2022-11-06 LAB — MAGNESIUM: Magnesium: 2.2 mg/dL (ref 1.7–2.4)

## 2022-11-06 MED ORDER — OXYCODONE HCL 10 MG PO TABS: 10.0000 mg | ORAL_TABLET | Freq: Four times a day (QID) | ORAL | 0 refills | Status: DC

## 2022-11-06 MED ORDER — ATORVASTATIN CALCIUM 40 MG PO TABS: 40.0000 mg | ORAL_TABLET | Freq: Every day | ORAL | Status: DC

## 2022-11-06 MED ORDER — SENNOSIDES-DOCUSATE SODIUM 8.6-50 MG PO TABS: 1.0000 | ORAL_TABLET | Freq: Two times a day (BID) | ORAL | Status: DC | PRN

## 2022-11-06 MED ORDER — POLYETHYLENE GLYCOL 3350 17 G PO PACK: 17.0000 g | PACK | Freq: Two times a day (BID) | ORAL | 0 refills | Status: DC | PRN

## 2022-11-06 MED ORDER — TICAGRELOR 90 MG PO TABS: 90.0000 mg | ORAL_TABLET | Freq: Two times a day (BID) | ORAL | Status: AC

## 2022-11-06 NOTE — Care Management Important Message (Signed)
Important Message  Patient Details  Name: Amy Mckay MRN: [id number] Date of Birth: 1935/06/10   Medicare Important Message Given:  Yes     Romen Yutzy Stefan Church 11/06/2022, 3:31 PM

## 2022-11-06 NOTE — Progress Notes (Signed)
PROGRESS NOTE  Amy Mckay ZOX:[id number] DOB: [birth year]   PCP: Annita Brod, MD  Patient is from: Home.  DOA: 11/03/2022 LOS: 3  Chief complaints Chief Complaint  Patient presents with   Numbness     Brief Narrative / Interim history: 88 y.o. female with medical history significant of dementia, PAD, COPD, HTN, GERD and rectal prolapse presented to ED with progressive right-sided weakness for about 2 days and found to have left basal ganglia infarct. MRI brain Small acute perforator infarct in the left basal ganglia/overlying posterior left frontal posterior left frontal white matter.  CT angio head and neck with no emergent LVO.  LE venous Doppler negative for DVT bilaterally.  TTE without significant finding.  A1c 6.3%.  LDL 134.  Patient was on aspirin and Plavix prior to this hospitalization.  Neurology recommended Brilinta and aspirin.   Hospital course complicated by hypotension that has resolved with IV fluid.  Therapy recommended SNF.  Medically stable for discharge.  Subjective: Seen and examined earlier this morning.  No major events overnight of this morning.  Pain has improved.  Objective: Vitals:   11/06/22 0005 11/06/22 0435 11/06/22 0724 11/06/22 1115  BP: 91/72 (!) 87/66 95/74 (!) 88/67  Pulse: 97 72 80 86  Resp: 17 17 18 16   Temp: 98.2 F (36.8 C) 98.2 F (36.8 C) 98.4 F (36.9 C) 98.5 F (36.9 C)  TempSrc: Oral Oral Oral Oral  SpO2: 91% (!) 87% 95% 93%  Weight:      Height:        Examination:   GENERAL: No apparent distress.  Nontoxic. HEENT: MMM.  Vision and hearing grossly intact.  NECK: Supple.  No apparent JVD.  RESP:  No IWOB.  Fair aeration bilaterally. CVS:  RRR. Heart sounds normal.  ABD/GI/GU: BS+. Abd soft, NTND.  MSK/EXT:   No apparent deformity. Moves extremities. No edema.  SKIN: no apparent skin lesion or wound NEURO: Awake and alert. Oriented fairly.  No apparent focal neuro deficit. PSYCH: Calm. Normal affect.   Procedures:   None  Microbiology summarized: None  Assessment and plan: Principal Problem:   Stroke [facility])  Acute left basal ganglia stroke: Presents with progressive right-sided weakness for 2 days. MRI brain Small acute perforator infarct in the left basal ganglia/overlying posterior left frontal posterior left frontal white matter.  CT angio head and neck without LVO.  TTE without significant finding.  UDS negative.  LDL 134.  A1c 6.3%.  Still with subtle RLE weakness.  Patient was on aspirin and Plavix before admission.  Per family, compliant with meds. -Neurology recommends aspirin and Brilinta -Continue statin. -PT/OT recommended SNF.  Medically stable for discharge.  Hypotension: Episode of hypotension on 5/22.  Not on antihypertensive meds.  Hypokalemia resolved with IV fluid.  TTE reassuring. -Encourage oral hydration.  Chronic pain syndrome: PDMP reviewed.  Fills 10 mg oxycodone, #120 and gabapentin 800 mg tablet #90 every month.  -Continue home oxycodone -Continue gabapentin at 600 mg 3 times daily.  Takes 800 mg 3 times daily at home  Microcytic iron deficiency anemia due to chronic blood loss.  H&H relatively stable. Recent Labs    09/10/22 1615 11/03/22 1136 11/03/22 1146 11/05/22 0437 11/06/22 0634  HGB 9.2* 9.5* 11.9* 8.7* 8.9*  -IV ferric gluconate 250 mg daily x 2 -Monitor H&H   Hypothyroidism -Continue Synthroid   Leukocytosis: Likely demargination.  Resolved.   Rectal prolapse, chronic stable -Continue rectal prolapse care -Consider outpatient follow-up with colorectal surgery   Dementia  without behavior disturbance: Stable. -Reorientation and delirium   PAD, chronic stable -Aspirin, Brilinta and statin as aboveprecaution -Continue home memantine   GERD: Reports reflux. -Continue Protonix 40 mg daily -GI cocktail as needed  COPD, chronic stable -Nebs as needed  Body mass index is 22.99 kg/m.         DVT prophylaxis:  Place and maintain sequential  compression device Start: 11/03/22 1717  Code Status: Full code Family Communication: Updated granddaughter, Megan at bedside on 5/23.  None at bedside today Level of care: Telemetry Medical Status is: Inpatient Remains inpatient appropriate because: SNF bed/placement   Final disposition: SNF Consultants:  Neurology  35 minutes with more than 50% spent in reviewing records, counseling patient/family and coordinating care.   Sch Meds:  Scheduled Meds:  acetaminophen  1,000 mg Oral Q8H   aspirin EC  81 mg Oral Daily   atorvastatin  40 mg Oral Daily   gabapentin  600 mg Oral TID   levothyroxine  75 mcg Oral Q0600   lidocaine  1 patch Transdermal Q24H   pantoprazole  40 mg Oral Daily   pramipexole  0.25 mg Oral TID   ticagrelor  90 mg Oral BID   Continuous Infusions:   PRN Meds:.alum & mag hydroxide-simeth, ondansetron (ZOFRAN) IV, oxyCODONE, [COMPLETED] polyethylene glycol **FOLLOWED BY** polyethylene glycol, [COMPLETED] senna-docusate **FOLLOWED BY** senna-docusate  Antimicrobials: Anti-infectives (From admission, onward)    None        I have personally reviewed the following labs and images: CBC: Recent Labs  Lab 11/03/22 1136 11/03/22 1146 11/05/22 0437 11/06/22 0634  WBC 10.9*  --  9.5 8.6  NEUTROABS 6.7  --   --   --   HGB 9.5* 11.9* 8.7* 8.9*  HCT 31.7* 35.0* 28.8* 29.4*  MCV 77.9*  --  76.2* 76.8*  PLT 460*  --  407* 405*   BMP &GFR Recent Labs  Lab 11/03/22 1136 11/03/22 1146 11/05/22 0437 11/06/22 0634  NA 134* 136 137 134*  K 4.2 4.4 3.8 3.8  CL 96* 94* 101 98  CO2 30  --  28 27  GLUCOSE 101* 97 104* 96  BUN 19 20 22 17   CREATININE 0.85 0.90 1.14* 1.12*  CALCIUM 9.0  --  8.4* 8.7*  MG  --   --  2.1 2.2  PHOS  --   --  4.7* 4.3   Estimated Creatinine Clearance: 27.2 mL/min (A) (by C-G formula based on SCr of 1.12 mg/dL (H)). Liver & Pancreas: Recent Labs  Lab 11/03/22 1136 11/05/22 0437 11/06/22 0634  AST 22  --   --   ALT 18   --   --   ALKPHOS 86  --   --   BILITOT 0.5  --   --   PROT 7.7  --   --   ALBUMIN 3.8 2.9* 3.1*   No results for input(s): "LIPASE", "AMYLASE" in the last 168 hours. No results for input(s): "AMMONIA" in the last 168 hours. Diabetic: Recent Labs    11/04/22 1017  HGBA1C 6.3*   No results for input(s): "GLUCAP" in the last 168 hours. Cardiac Enzymes: No results for input(s): "CKTOTAL", "CKMB", "CKMBINDEX", "TROPONINI" in the last 168 hours. No results for input(s): "PROBNP" in the last 8760 hours. Coagulation Profile: Recent Labs  Lab 11/03/22 1136  INR 0.9   Thyroid Function Tests: No results for input(s): "TSH", "T4TOTAL", "FREET4", "T3FREE", "THYROIDAB" in the last 72 hours. Lipid Profile: Recent Labs    11/04/22 1017  CHOL 203*  HDL 54  LDLCALC 134*  TRIG 76  CHOLHDL 3.8   Anemia Panel: Recent Labs    11/05/22 0437  VITAMINB12 479  FOLATE 12.0  FERRITIN 13  TIBC 489*  IRON 22*  RETICCTPCT 1.7   Urine analysis:    Component Value Date/Time   COLORURINE STRAW (A) 11/03/2022 1138   APPEARANCEUR CLEAR 11/03/2022 1138   LABSPEC 1.003 (L) 11/03/2022 1138   PHURINE 7.0 11/03/2022 1138   GLUCOSEU NEGATIVE 11/03/2022 1138   HGBUR MODERATE (A) 11/03/2022 1138   BILIRUBINUR NEGATIVE 11/03/2022 1138   KETONESUR NEGATIVE 11/03/2022 1138   PROTEINUR NEGATIVE 11/03/2022 1138   NITRITE NEGATIVE 11/03/2022 1138   LEUKOCYTESUR TRACE (A) 11/03/2022 1138   Sepsis Labs: Invalid input(s): "PROCALCITONIN", "LACTICIDVEN"  Microbiology: No results found for this or any previous visit (from the past 240 hour(s)).  Radiology Studies: No results found.    Hildreth Orsak T. Wrigley Winborne [facility]  If 7PM-7AM, please contact night-coverage [email or web address] 11/06/2022, 12:50 PM

## 2022-11-06 NOTE — Progress Notes (Addendum)
Occupational Therapy Treatment Patient Details Name: Amy Mckay MRN: [id number] DOB: [birth year] Today's Date: 11/06/2022   History of present illness Amy Mckay is a 88 y.o. female who presented 11/03/22 with right-sided weakness. 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 PMH: PAD, COPD, HTN, GERD, rectal prolapse and dementia   OT comments  Pt progressing well, able to mobilize to/from bathroom using RW and manage toileting task with min guard. Pt w/ new bowel incontinence and discomfort due to prolapsed rectum impacting daily routine tasks. Pt also observably fatigued after completion of these tasks. Noted family inquiring about postacute rehab needs w/ pt agreeable for rehab at [facility].   Recommendations for follow up therapy are one component of a multi-disciplinary discharge planning process, led by the attending physician.  Recommendations may be updated based on patient status, additional functional criteria and insurance authorization.    Assistance Recommended at Discharge Set up Supervision/Assistance  Patient can return home with the following  A little help with bathing/dressing/bathroom;Assistance with cooking/housework;Direct supervision/assist for medications management;Direct supervision/assist for financial management;Assist for transportation   Equipment Recommendations  None recommended by OT    Recommendations for Other Services      Precautions / Restrictions Precautions Precautions: Fall;Other (comment) Precaution Comments: chronic prolapsed rectum Restrictions Weight Bearing Restrictions: No       Mobility Bed Mobility Overal bed mobility: Needs Assistance Bed Mobility: Supine to Sit, Sit to Supine     Supine to sit: Supervision, HOB elevated Sit to supine: Supervision, HOB elevated        Transfers Overall transfer level: Needs assistance Equipment used:  Rolling walker (2 wheels) Transfers: Sit to/from Stand Sit to Stand: Min guard                 Balance Overall balance assessment: Needs assistance Sitting-balance support: Feet supported, No upper extremity supported Sitting balance-Leahy Scale: Good     Standing balance support: Bilateral upper extremity supported, No upper extremity supported, Single extremity supported Standing balance-Leahy Scale: Fair                             ADL either performed or assessed with clinical judgement   ADL Overall ADL's : Needs assistance/impaired                     Lower Body Dressing: Supervision/safety;Sitting/lateral leans;Sit to/from stand Lower Body Dressing Details (indicate cue type and reason): donning and doffing shoes. able to easily reach down Toilet Transfer: Min guard;Ambulation;Rolling walker (2 wheels) Toilet Transfer Details (indicate cue type and reason): minor assist for IV pole mgmt but no LOB, able to easily stand from toilet Toileting- Clothing Manipulation and Hygiene: Supervision/safety;Sitting/lateral lean;Sit to/from stand Toileting - Clothing Manipulation Details (indicate cue type and reason): able to manage pad for bowel incontinence, hygiene and clothing. increase time d/t bowel incontinence and gentle cleaning due to prolapsed rectum     Functional mobility during ADLs: Min guard;Rolling walker (2 wheels) General ADL Comments: limited by prolapsed rectal pain but moving well and managing ADLs without assist    Extremity/Trunk Assessment Upper Extremity Assessment Upper Extremity Assessment: RUE deficits/detail RUE Deficits / Details: weaker than LUE, 4/5   Lower Extremity Assessment Lower Extremity Assessment: Defer to PT evaluation        Vision   Vision Assessment?: No apparent visual deficits   Perception  Praxis      Cognition Arousal/Alertness: Awake/alert Behavior During Therapy: WFL for tasks  assessed/performed Overall Cognitive Status: History of cognitive impairments - at baseline                                 General Comments: per chart, pt with dementia at baseline, no family to present. follows commands, shows insight into deficits and anticipating needs. overall functional        Exercises      Shoulder Instructions       General Comments      Pertinent Vitals/ Pain       Pain Assessment Pain Assessment: Faces Faces Pain Scale: Hurts even more Pain Location: rectum Pain Descriptors / Indicators: Grimacing, Guarding, Discomfort Pain Intervention(s): Monitored during session, RN gave pain meds during session, Patient requesting pain meds-RN notified, Other (comment) (nurse applied cream)  Home Living                                          Prior Functioning/Environment              Frequency  Min 2X/week        Progress Toward Goals  OT Goals(current goals can now be found in the care plan section)     Acute Rehab OT Goals Patient Stated Goal: resolve prolapsed rectum OT Goal Formulation: With patient Time For Goal Achievement: 11/18/22 Potential to Achieve Goals: Good  Plan      Co-evaluation                 AM-PAC OT "6 Clicks" Daily Activity     Outcome Measure   Help from another person eating meals?: None Help from another person taking care of personal grooming?: A Little Help from another person toileting, which includes using toliet, bedpan, or urinal?: A Little Help from another person bathing (including washing, rinsing, drying)?: A Little Help from another person to put on and taking off regular upper body clothing?: A Little Help from another person to put on and taking off regular lower body clothing?: A Little 6 Click Score: 19    End of Session Equipment Utilized During Treatment: Rolling walker (2 wheels)  OT Visit Diagnosis: Other abnormalities of gait and mobility  (R26.89);Muscle weakness (generalized) (M62.81)   Activity Tolerance Patient tolerated treatment well;Patient limited by pain   Patient Left in bed;with call bell/phone within reach;with bed alarm set;with nursing/sitter in room   Nurse Communication Mobility status;Patient requests pain meds        Time: 1210-1249 OT Time Calculation (min): 39 min  Charges: OT General Charges $OT Visit: 1 Visit OT Treatments $Self Care/Home Management : 23-37 mins $Therapeutic Activity: 8-22 mins  Bradd Canary, OTR/L Acute Rehab Services Office: [phone]   Lorre Munroe 11/06/2022, 1:47 PM

## 2022-11-06 NOTE — Plan of Care (Signed)

## 2022-11-07 DIAGNOSIS — I639 Cerebral infarction, unspecified: Secondary | ICD-10-CM | POA: Diagnosis not present

## 2022-11-07 DIAGNOSIS — I959 Hypotension, unspecified: Secondary | ICD-10-CM | POA: Diagnosis not present

## 2022-11-07 DIAGNOSIS — D509 Iron deficiency anemia, unspecified: Secondary | ICD-10-CM | POA: Diagnosis not present

## 2022-11-07 DIAGNOSIS — M545 Low back pain, unspecified: Secondary | ICD-10-CM | POA: Diagnosis not present

## 2022-11-07 NOTE — Progress Notes (Signed)
PROGRESS NOTE  Amy Mckay:[id number] DOB: 04/20/1935   PCP: Annita Brod, MD  Patient is from: Home.  DOA: 11/03/2022 LOS: 4  Chief complaints Chief Complaint  Patient presents with   Numbness     Brief Narrative / Interim history: 88 y.o. female with medical history significant of dementia, PAD, COPD, HTN, GERD and rectal prolapse presented to ED with progressive right-sided weakness for about 2 days and found to have left basal ganglia infarct. MRI brain Small acute perforator infarct in the left basal ganglia/overlying posterior left frontal posterior left frontal white matter.  CT angio head and neck with no emergent LVO.  LE venous Doppler negative for DVT bilaterally.  TTE without significant finding.  A1c 6.3%.  LDL 134.  Patient was on aspirin and Plavix prior to this hospitalization.  Neurology recommended Brilinta and aspirin.   Hospital course complicated by hypotension that has resolved with IV fluid.  Therapy recommended SNF.  Medically stable for discharge.  Subjective: Seen and examined earlier this morning.  No major events overnight of this morning.  No complaints.  Pain improved. Objective: Vitals:   11/07/22 0323 11/07/22 0716 11/07/22 0720 11/07/22 1122  BP: (!) 111/58 (!) 120/91 121/81 93/67  Pulse: 78 83  79  Resp: 17 20  16   Temp: 98.1 F (36.7 C) 98.1 F (36.7 C)  98.4 F (36.9 C)  TempSrc: Oral Oral  Oral  SpO2: 90% 93%  91%  Weight:      Height:        Examination:   GENERAL: No apparent distress.  Nontoxic. HEENT: MMM.  Vision and hearing grossly intact.  NECK: Supple.  No apparent JVD.  RESP:  No IWOB.  Fair aeration bilaterally. CVS:  RRR. Heart sounds normal.  ABD/GI/GU: BS+. Abd soft, NTND.  MSK/EXT:   No apparent deformity. Moves extremities. No edema.  SKIN: no apparent skin lesion or wound NEURO: Awake and alert. Oriented fairly.  No apparent focal neuro deficit. PSYCH: Calm. Normal affect.   Procedures:   None  Microbiology summarized: None  Assessment and plan: Principal Problem:   Stroke [facility])  Acute left basal ganglia stroke: Presents with progressive right-sided weakness for 2 days. MRI brain Small acute perforator infarct in the left basal ganglia/overlying posterior left frontal posterior left frontal white matter.  CT angio head and neck without LVO.  TTE without significant finding.  UDS negative.  LDL 134.  A1c 6.3%.  Still with subtle RLE weakness.  Patient was on aspirin and Plavix before admission.  Per family, compliant with meds. -Neurology recommends aspirin and Brilinta -Continue statin. -PT/OT recommended SNF.  Hypotension: Episode of hypotension on 5/22.  Not on antihypertensive meds.  Hypokalemia resolved with IV fluid.  TTE reassuring. -Encourage oral hydration.  Chronic pain syndrome: PDMP reviewed.  Fills 10 mg oxycodone, #120 and gabapentin 800 mg tablet #90 every month.  -Continue home oxycodone -Continue gabapentin at 600 mg 3 times daily.  Takes 800 mg 3 times daily at home  Microcytic iron deficiency anemia due to chronic blood loss.  H&H relatively stable. Recent Labs    09/10/22 1615 11/03/22 1136 11/03/22 1146 11/05/22 0437 11/06/22 0634  HGB 9.2* 9.5* 11.9* 8.7* 8.9*  -Received IV ferric gluconate 250 mg daily x 2 -Recheck CBC in 1 week   Hypothyroidism -Continue Synthroid   Leukocytosis: Likely demargination.  Resolved.   Rectal prolapse, chronic stable -Continue rectal prolapse care -Consider outpatient follow-up with colorectal surgery   Dementia without behavior disturbance: Stable. -  Reorientation and delirium   PAD, chronic stable -Aspirin, Brilinta and statin as aboveprecaution -Continue home memantine   GERD: Reports reflux. -Continue Protonix 40 mg daily -GI cocktail as needed  COPD, chronic stable -Nebs as needed  Body mass index is 22.99 kg/m.         DVT prophylaxis:  Place and maintain sequential compression  device Start: 11/03/22 1717  Code Status: Full code Family Communication: None at bedside. Level of care: Med-Surg Status is: Inpatient Remains inpatient appropriate because: SNF bed/placement   Final disposition: SNF Consultants:  Neurology  35 minutes with more than 50% spent in reviewing records, counseling patient/family and coordinating care.   Sch Meds:  Scheduled Meds:  acetaminophen  1,000 mg Oral Q8H   aspirin EC  81 mg Oral Daily   atorvastatin  40 mg Oral Daily   gabapentin  600 mg Oral TID   levothyroxine  75 mcg Oral Q0600   lidocaine  1 patch Transdermal Q24H   pantoprazole  40 mg Oral Daily   pramipexole  0.25 mg Oral TID   ticagrelor  90 mg Oral BID   Continuous Infusions:   PRN Meds:.alum & mag hydroxide-simeth, ondansetron (ZOFRAN) IV, oxyCODONE, [COMPLETED] polyethylene glycol **FOLLOWED BY** polyethylene glycol, [COMPLETED] senna-docusate **FOLLOWED BY** senna-docusate  Antimicrobials: Anti-infectives (From admission, onward)    None        I have personally reviewed the following labs and images: CBC: Recent Labs  Lab 11/03/22 1136 11/03/22 1146 11/05/22 0437 11/06/22 0634  WBC 10.9*  --  9.5 8.6  NEUTROABS 6.7  --   --   --   HGB 9.5* 11.9* 8.7* 8.9*  HCT 31.7* 35.0* 28.8* 29.4*  MCV 77.9*  --  76.2* 76.8*  PLT 460*  --  407* 405*   BMP &GFR Recent Labs  Lab 11/03/22 1136 11/03/22 1146 11/05/22 0437 11/06/22 0634  NA 134* 136 137 134*  K 4.2 4.4 3.8 3.8  CL 96* 94* 101 98  CO2 30  --  28 27  GLUCOSE 101* 97 104* 96  BUN 19 20 22 17   CREATININE 0.85 0.90 1.14* 1.12*  CALCIUM 9.0  --  8.4* 8.7*  MG  --   --  2.1 2.2  PHOS  --   --  4.7* 4.3   Estimated Creatinine Clearance: 27.2 mL/min (A) (by C-G formula based on SCr of 1.12 mg/dL (H)). Liver & Pancreas: Recent Labs  Lab 11/03/22 1136 11/05/22 0437 11/06/22 0634  AST 22  --   --   ALT 18  --   --   ALKPHOS 86  --   --   BILITOT 0.5  --   --   PROT 7.7  --   --    ALBUMIN 3.8 2.9* 3.1*   No results for input(s): "LIPASE", "AMYLASE" in the last 168 hours. No results for input(s): "AMMONIA" in the last 168 hours. Diabetic: No results for input(s): "HGBA1C" in the last 72 hours.  No results for input(s): "GLUCAP" in the last 168 hours. Cardiac Enzymes: No results for input(s): "CKTOTAL", "CKMB", "CKMBINDEX", "TROPONINI" in the last 168 hours. No results for input(s): "PROBNP" in the last 8760 hours. Coagulation Profile: Recent Labs  Lab 11/03/22 1136  INR 0.9   Thyroid Function Tests: No results for input(s): "TSH", "T4TOTAL", "FREET4", "T3FREE", "THYROIDAB" in the last 72 hours. Lipid Profile: No results for input(s): "CHOL", "HDL", "LDLCALC", "TRIG", "CHOLHDL", "LDLDIRECT" in the last 72 hours.  Anemia Panel: Recent Labs  11/05/22 0437  VITAMINB12 479  FOLATE 12.0  FERRITIN 13  TIBC 489*  IRON 22*  RETICCTPCT 1.7   Urine analysis:    Component Value Date/Time   COLORURINE STRAW (A) 11/03/2022 1138   APPEARANCEUR CLEAR 11/03/2022 1138   LABSPEC 1.003 (L) 11/03/2022 1138   PHURINE 7.0 11/03/2022 1138   GLUCOSEU NEGATIVE 11/03/2022 1138   HGBUR MODERATE (A) 11/03/2022 1138   BILIRUBINUR NEGATIVE 11/03/2022 1138   KETONESUR NEGATIVE 11/03/2022 1138   PROTEINUR NEGATIVE 11/03/2022 1138   NITRITE NEGATIVE 11/03/2022 1138   LEUKOCYTESUR TRACE (A) 11/03/2022 1138   Sepsis Labs: Invalid input(s): "PROCALCITONIN", "LACTICIDVEN"  Microbiology: No results found for this or any previous visit (from the past 240 hour(s)).  Radiology Studies: No results found.    Demetress Tift T. Valery Chance [facility]  If 7PM-7AM, please contact night-coverage [email or web address] 11/07/2022, 1:01 PM

## 2022-11-08 DIAGNOSIS — I639 Cerebral infarction, unspecified: Secondary | ICD-10-CM | POA: Diagnosis not present

## 2022-11-08 DIAGNOSIS — I959 Hypotension, unspecified: Secondary | ICD-10-CM | POA: Diagnosis not present

## 2022-11-08 DIAGNOSIS — D509 Iron deficiency anemia, unspecified: Secondary | ICD-10-CM | POA: Diagnosis not present

## 2022-11-08 DIAGNOSIS — M545 Low back pain, unspecified: Secondary | ICD-10-CM | POA: Diagnosis not present

## 2022-11-08 LAB — CBC
HCT: 30.1 % — ABNORMAL LOW (ref 36.0–46.0)
Hemoglobin: 9.1 g/dL — ABNORMAL LOW (ref 12.0–15.0)
MCH: 23.2 pg — ABNORMAL LOW (ref 26.0–34.0)
MCHC: 30.2 g/dL (ref 30.0–36.0)
MCV: 76.6 fL — ABNORMAL LOW (ref 80.0–100.0)
Platelets: 392 10*3/uL (ref 150–400)
RBC: 3.93 MIL/uL (ref 3.87–5.11)
RDW: 15.9 % — ABNORMAL HIGH (ref 11.5–15.5)
WBC: 7.5 10*3/uL (ref 4.0–10.5)
nRBC: 0 % (ref 0.0–0.2)

## 2022-11-08 LAB — RENAL FUNCTION PANEL
Albumin: 3.4 g/dL — ABNORMAL LOW (ref 3.5–5.0)
Anion gap: 12 (ref 5–15)
BUN: 14 mg/dL (ref 8–23)
CO2: 25 mmol/L (ref 22–32)
Calcium: 8.9 mg/dL (ref 8.9–10.3)
Chloride: 96 mmol/L — ABNORMAL LOW (ref 98–111)
Creatinine, Ser: 0.98 mg/dL (ref 0.44–1.00)
GFR, Estimated: 56 mL/min — ABNORMAL LOW (ref >60)
Glucose, Bld: 131 mg/dL — ABNORMAL HIGH (ref 70–99)
Phosphorus: 4 mg/dL (ref 2.5–4.6)
Potassium: 3.6 mmol/L (ref 3.5–5.1)
Sodium: 133 mmol/L — ABNORMAL LOW (ref 135–145)

## 2022-11-08 LAB — MAGNESIUM: Magnesium: 2 mg/dL (ref 1.7–2.4)

## 2022-11-08 MED ORDER — SENNOSIDES-DOCUSATE SODIUM 8.6-50 MG PO TABS
1.0000 | ORAL_TABLET | Freq: Two times a day (BID) | ORAL | Status: DC
  Administered 2022-11-08: 1 via ORAL
  Filled 2022-11-08: qty 1

## 2022-11-08 NOTE — Progress Notes (Signed)
PROGRESS NOTE  Amy Mckay ZOX:[id number] DOB: 1934-12-11   PCP: Annita Brod, MD  Patient is from: Home.  DOA: 11/03/2022 LOS: 5  Chief complaints Chief Complaint  Patient presents with   Numbness     Brief Narrative / Interim history: 88 y.o. female with medical history significant of dementia, PAD, COPD, HTN, GERD and rectal prolapse presented to ED with progressive right-sided weakness for about 2 days and found to have left basal ganglia infarct. MRI brain Small acute perforator infarct in the left basal ganglia/overlying posterior left frontal posterior left frontal white matter.  CT angio head and neck with no emergent LVO.  LE venous Doppler negative for DVT bilaterally.  TTE without significant finding.  A1c 6.3%.  LDL 134.  Patient was on aspirin and Plavix prior to this hospitalization.  Neurology recommended Brilinta and aspirin.   Hospital course complicated by hypotension that has resolved with IV fluid.  Therapy recommended SNF.  Medically stable for discharge.  Subjective: Seen and examined earlier this morning.  Reports some pain across lower abdomen.  She states she had a small hard stool when she had a BM yesterday.   Objective: Vitals:   11/07/22 1521 11/07/22 2344 11/08/22 0349 11/08/22 0755  BP: (!) 88/60 96/69 113/72 111/87  Pulse: 79 65 78 87  Resp: 16 17 16 16   Temp: 98.3 F (36.8 C) 98.2 F (36.8 C) 98.4 F (36.9 C) 98.2 F (36.8 C)  TempSrc: Oral Oral Oral Oral  SpO2: 92% 94% 94% 94%  Weight:      Height:        Examination:   GENERAL: No apparent distress.  Nontoxic. HEENT: MMM.  Vision and hearing grossly intact.  NECK: Supple.  No apparent JVD.  RESP:  No IWOB.  Fair aeration bilaterally. CVS:  RRR. Heart sounds normal.  ABD/GI/GU: BS+. Abd soft, NTND.  MSK/EXT:   No apparent deformity. Moves extremities. No edema.  SKIN: no apparent skin lesion or wound NEURO: Awake and alert. Oriented fairly.  No apparent focal neuro  deficit. PSYCH: Calm. Normal affect.   Procedures:  None  Microbiology summarized: None  Assessment and plan: Principal Problem:   Stroke [facility])  Acute left basal ganglia stroke: Presents with progressive right-sided weakness for 2 days. MRI brain Small acute perforator infarct in the left basal ganglia/overlying posterior left frontal posterior left frontal white matter.  CT angio head and neck without LVO.  TTE without significant finding.  UDS negative.  LDL 134.  A1c 6.3%.  Still with subtle RLE weakness.  Patient was on aspirin and Plavix before admission.  Per family, compliant with meds. -Continue aspirin and Brilinta per neurology -Continue statin. -PT/OT recommended SNF.  Hypotension: Episode of hypotension on 5/22.  Not on antihypertensive meds.  Hypokalemia resolved with IV fluid.  TTE reassuring. -Encourage oral hydration.  Chronic pain syndrome: PDMP reviewed.  Fills 10 mg oxycodone, #120 and gabapentin 800 mg tablet #90 every month.  -Continue home oxycodone -Continue gabapentin at 600 mg 3 times daily.  Takes 800 mg 3 times daily at home -Scheduled and as needed bowel regimen  Microcytic iron deficiency anemia due to chronic blood loss.  H&H relatively stable. Recent Labs    09/10/22 1615 11/03/22 1136 11/03/22 1146 11/05/22 0437 11/06/22 0634 11/08/22 0910  HGB 9.2* 9.5* 11.9* 8.7* 8.9* 9.1*  -Received IV ferric gluconate 250 mg daily x 2 -Recheck CBC in 1 week   Hypothyroidism -Continue Synthroid   Leukocytosis: Likely demargination.  Resolved.  Rectal prolapse, chronic stable -Continue rectal prolapse care -Per granddaughter, told to be not a candidate for surgical intervention previously -Scheduled and as needed bowel regimen   Dementia without behavior disturbance: Stable. -Reorientation and delirium   PAD, chronic stable -Aspirin, Brilinta and statin as above -Continue home memantine   GERD: Reports reflux. -Continue Protonix 40 mg  daily -GI cocktail as needed  COPD, chronic stable -Nebs as needed  Body mass index is 22.99 kg/m.         DVT prophylaxis:  Place and maintain sequential compression device Start: 11/03/22 1717  Code Status: Full code Family Communication: None at bedside. Level of care: Med-Surg Status is: Inpatient Remains inpatient appropriate because: SNF bed/placement   Final disposition: SNF Consultants:  Neurology  35 minutes with more than 50% spent in reviewing records, counseling patient/family and coordinating care.   Sch Meds:  Scheduled Meds:  acetaminophen  1,000 mg Oral Q8H   aspirin EC  81 mg Oral Daily   atorvastatin  40 mg Oral Daily   gabapentin  600 mg Oral TID   levothyroxine  75 mcg Oral Q0600   lidocaine  1 patch Transdermal Q24H   pantoprazole  40 mg Oral Daily   pramipexole  0.25 mg Oral TID   senna-docusate  1 tablet Oral BID   ticagrelor  90 mg Oral BID   Continuous Infusions:   PRN Meds:.alum & mag hydroxide-simeth, ondansetron (ZOFRAN) IV, oxyCODONE, [COMPLETED] polyethylene glycol **FOLLOWED BY** polyethylene glycol  Antimicrobials: Anti-infectives (From admission, onward)    None        I have personally reviewed the following labs and images: CBC: Recent Labs  Lab 11/03/22 1136 11/03/22 1146 11/05/22 0437 11/06/22 0634 11/08/22 0910  WBC 10.9*  --  9.5 8.6 7.5  NEUTROABS 6.7  --   --   --   --   HGB 9.5* 11.9* 8.7* 8.9* 9.1*  HCT 31.7* 35.0* 28.8* 29.4* 30.1*  MCV 77.9*  --  76.2* 76.8* 76.6*  PLT 460*  --  407* 405* 392   BMP &GFR Recent Labs  Lab 11/03/22 1136 11/03/22 1146 11/05/22 0437 11/06/22 0634 11/08/22 0910  NA 134* 136 137 134* 133*  K 4.2 4.4 3.8 3.8 3.6  CL 96* 94* 101 98 96*  CO2 30  --  28 27 25   GLUCOSE 101* 97 104* 96 131*  BUN 19 20 22 17 14   CREATININE 0.85 0.90 1.14* 1.12* 0.98  CALCIUM 9.0  --  8.4* 8.7* 8.9  MG  --   --  2.1 2.2 2.0  PHOS  --   --  4.7* 4.3 4.0   Estimated Creatinine  Clearance: 31.1 mL/min (by C-G formula based on SCr of 0.98 mg/dL). Liver & Pancreas: Recent Labs  Lab 11/03/22 1136 11/05/22 0437 11/06/22 0634 11/08/22 0910  AST 22  --   --   --   ALT 18  --   --   --   ALKPHOS 86  --   --   --   BILITOT 0.5  --   --   --   PROT 7.7  --   --   --   ALBUMIN 3.8 2.9* 3.1* 3.4*   No results for input(s): "LIPASE", "AMYLASE" in the last 168 hours. No results for input(s): "AMMONIA" in the last 168 hours. Diabetic: No results for input(s): "HGBA1C" in the last 72 hours.  No results for input(s): "GLUCAP" in the last 168 hours. Cardiac Enzymes: No results for  input(s): "CKTOTAL", "CKMB", "CKMBINDEX", "TROPONINI" in the last 168 hours. No results for input(s): "PROBNP" in the last 8760 hours. Coagulation Profile: Recent Labs  Lab 11/03/22 1136  INR 0.9   Thyroid Function Tests: No results for input(s): "TSH", "T4TOTAL", "FREET4", "T3FREE", "THYROIDAB" in the last 72 hours. Lipid Profile: No results for input(s): "CHOL", "HDL", "LDLCALC", "TRIG", "CHOLHDL", "LDLDIRECT" in the last 72 hours.  Anemia Panel: No results for input(s): "VITAMINB12", "FOLATE", "FERRITIN", "TIBC", "IRON", "RETICCTPCT" in the last 72 hours.  Urine analysis:    Component Value Date/Time   COLORURINE STRAW (A) 11/03/2022 1138   APPEARANCEUR CLEAR 11/03/2022 1138   LABSPEC 1.003 (L) 11/03/2022 1138   PHURINE 7.0 11/03/2022 1138   GLUCOSEU NEGATIVE 11/03/2022 1138   HGBUR MODERATE (A) 11/03/2022 1138   BILIRUBINUR NEGATIVE 11/03/2022 1138   KETONESUR NEGATIVE 11/03/2022 1138   PROTEINUR NEGATIVE 11/03/2022 1138   NITRITE NEGATIVE 11/03/2022 1138   LEUKOCYTESUR TRACE (A) 11/03/2022 1138   Sepsis Labs: Invalid input(s): "PROCALCITONIN", "LACTICIDVEN"  Microbiology: No results found for this or any previous visit (from the past 240 hour(s)).  Radiology Studies: No results found.    Jaydee Ingman T. Carime Dinkel [facility]  If 7PM-7AM, please contact  night-coverage [email or web address] 11/08/2022, 11:59 AM

## 2022-11-09 DIAGNOSIS — I639 Cerebral infarction, unspecified: Secondary | ICD-10-CM | POA: Diagnosis not present

## 2022-11-09 DIAGNOSIS — D509 Iron deficiency anemia, unspecified: Secondary | ICD-10-CM | POA: Diagnosis not present

## 2022-11-09 DIAGNOSIS — M545 Low back pain, unspecified: Secondary | ICD-10-CM | POA: Diagnosis not present

## 2022-11-09 DIAGNOSIS — I959 Hypotension, unspecified: Secondary | ICD-10-CM | POA: Diagnosis not present

## 2022-11-09 MED ORDER — SENNOSIDES-DOCUSATE SODIUM 8.6-50 MG PO TABS: 1.0000 | ORAL_TABLET | Freq: Two times a day (BID) | ORAL | Status: DC | PRN

## 2022-11-09 NOTE — Progress Notes (Signed)
Physical Therapy Treatment Patient Details Name: Amy Mckay MRN: [id number] DOB: 08-Nov-1934 Today's Date: 11/09/2022   History of Present Illness Amy Mckay is a 88 y.o. female who presented 11/03/22 with right-sided weakness. 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 PMH: PAD, COPD, HTN, GERD, rectal prolapse and dementia    PT Comments    Pt received in bed, relays that she is having continued difficulty with prolapsed rectum and bowel incontinence. Pt able to get in and out of bed without physical assist. Reports rectal pain and L quadrant pain. Pt ambulated 35' before needing a seated rest break due to mild dizziness. HR low 100's, SPO2 94% on RA. Pt able to ambulate another 28' after 3 mins rest. Min A needed for safe ambulation. Pt has rollator at home which is an appropriate device for her. PT will continue to follow.    Recommendations for follow up therapy are one component of a multi-disciplinary discharge planning process, led by the attending physician.  Recommendations may be updated based on patient status, additional functional criteria and insurance authorization.  Follow Up Recommendations  Can patient physically be transported by private vehicle: Yes    Assistance Recommended at Discharge Intermittent Supervision/Assistance  Patient can return home with the following A little help with walking and/or transfers;A little help with bathing/dressing/bathroom;Assistance with cooking/housework;Assist for transportation;Help with stairs or ramp for entrance;Direct supervision/assist for financial management;Direct supervision/assist for medications management   Equipment Recommendations  Other (comment) (shower chair)    Recommendations for Other Services       Precautions / Restrictions Precautions Precautions: Fall;Other (comment) Precaution Comments: chronic prolapsed  rectum Restrictions Weight Bearing Restrictions: No     Mobility  Bed Mobility Overal bed mobility: Needs Assistance Bed Mobility: Supine to Sit, Sit to Supine     Supine to sit: Supervision, HOB elevated Sit to supine: Supervision, HOB elevated   General bed mobility comments: pt able to come to EOB as well as position self back in bed after ambulation    Transfers Overall transfer level: Needs assistance Equipment used: Rolling walker (2 wheels) Transfers: Sit to/from Stand Sit to Stand: Min assist           General transfer comment: min A for power up    Ambulation/Gait Ambulation/Gait assistance: Min guard Gait Distance (Feet): 70 Feet (35' x2) Assistive device: Rolling walker (2 wheels) Gait Pattern/deviations: Step-through pattern, Decreased stride length, Trunk flexed (scoliosis of spine) Gait velocity: reduced Gait velocity interpretation: <1.8 ft/sec, indicate of risk for recurrent falls   General Gait Details: Pt takes slow, small steps with a kyphotic posture, no LOB, min guard for safety.Chair taken with pt and pt needed seated rest break at 35' due to fatigue and mild dizziness. HR 110 bpm, SPO2 94% on RA   Stairs             Wheelchair Mobility    Modified Rankin (Stroke Patients Only) Modified Rankin (Stroke Patients Only) Pre-Morbid Rankin Score: Slight disability Modified Rankin: Moderately severe disability     Balance Overall balance assessment: Needs assistance Sitting-balance support: Feet supported, No upper extremity supported Sitting balance-Leahy Scale: Good     Standing balance support: Bilateral upper extremity supported, No upper extremity supported, Single extremity supported Standing balance-Leahy Scale: Fair  Cognition Arousal/Alertness: Awake/alert Behavior During Therapy: WFL for tasks assessed/performed Overall Cognitive Status: History of cognitive impairments - at  baseline                                 General Comments: per chart, pt with dementia at baseline, no family to present. follows commands, repeats self occasionally        Exercises      General Comments        Pertinent Vitals/Pain Pain Assessment Pain Assessment: Faces Faces Pain Scale: Hurts even more Pain Location: rectum Pain Descriptors / Indicators: Grimacing, Guarding, Discomfort Pain Intervention(s): Monitored during session    Home Living                          Prior Function            PT Goals (current goals can now be found in the care plan section) Acute Rehab PT Goals Patient Stated Goal: to improve PT Goal Formulation: With patient Time For Goal Achievement: 11/18/22 Potential to Achieve Goals: Good Progress towards PT goals: Progressing toward goals    Frequency    Min 3X/week      PT Plan Current plan remains appropriate    Co-evaluation              AM-PAC PT "6 Clicks" Mobility   Outcome Measure  Help needed turning from your back to your side while in a flat bed without using bedrails?: A Little Help needed moving from lying on your back to sitting on the side of a flat bed without using bedrails?: A Little Help needed moving to and from a bed to a chair (including a wheelchair)?: A Little Help needed standing up from a chair using your arms (e.g., wheelchair or bedside chair)?: A Little Help needed to walk in hospital room?: A Little Help needed climbing 3-5 steps with a railing? : A Little 6 Click Score: 18    End of Session Equipment Utilized During Treatment: Gait belt Activity Tolerance: Patient tolerated treatment well Patient left: with call bell/phone within reach;in bed;with bed alarm set Nurse Communication: Mobility status PT Visit Diagnosis: Unsteadiness on feet (R26.81);Other abnormalities of gait and mobility (R26.89);Muscle weakness (generalized) (M62.81);Difficulty in walking, not  elsewhere classified (R26.2);Other symptoms and signs involving the nervous system (R29.898)     Time: 1610-9604 PT Time Calculation (min) (ACUTE ONLY): 26 min  Charges:  $Gait Training: 23-37 mins                     Lyanne Co, PT  Acute Rehab Services Secure chat preferred Office [phone]    Amy Mckay 11/09/2022, 1:12 PM

## 2022-11-09 NOTE — Progress Notes (Signed)
PROGRESS NOTE  Amy Mckay ZOX:[id number] DOB: 02-11-1935   PCP: Annita Brod, MD  Patient is from: Home.  DOA: 11/03/2022 LOS: 5  Chief complaints Chief Complaint  Patient presents with   Numbness     Brief Narrative / Interim history: 88 y.o. female with medical history significant of dementia, PAD, COPD, HTN, GERD and rectal prolapse presented to ED with progressive right-sided weakness for about 2 days and found to have left basal ganglia infarct. MRI brain Small acute perforator infarct in the left basal ganglia/overlying posterior left frontal posterior left frontal white matter.  CT angio head and neck with no emergent LVO.  LE venous Doppler negative for DVT bilaterally.  TTE without significant finding.  A1c 6.3%.  LDL 134.  Patient was on aspirin and Plavix prior to this hospitalization.  Neurology recommended Brilinta and aspirin.   Hospital course complicated by hypotension that has resolved with IV fluid.  Therapy recommended SNF.  Medically stable for discharge.  Subjective: Seen and examined earlier this morning.  No major events overnight of this morning.  Abdominal pain resolved.  Had multiple bowel movement yesterday.  Objective: Vitals:   11/07/22 1521 11/07/22 2344 11/08/22 0349 11/08/22 0755  BP: (!) 88/60 96/69 113/72 111/87  Pulse: 79 65 78 87  Resp: 16 17 16 16   Temp: 98.3 F (36.8 C) 98.2 F (36.8 C) 98.4 F (36.9 C) 98.2 F (36.8 C)  TempSrc: Oral Oral Oral Oral  SpO2: 92% 94% 94% 94%  Weight:      Height:        Examination:   GENERAL: No apparent distress.  Nontoxic. HEENT: MMM.  Vision and hearing grossly intact.  NECK: Supple.  No apparent JVD.  RESP:  No IWOB.  Fair aeration bilaterally. CVS:  RRR. Heart sounds normal.  ABD/GI/GU: BS+. Abd soft, NTND.  MSK/EXT:   No apparent deformity. Moves extremities. No edema.  SKIN: no apparent skin lesion or wound NEURO: Awake and alert. Oriented fairly.  No apparent focal neuro  deficit. PSYCH: Calm. Normal affect.   Procedures:  None  Microbiology summarized: None  Assessment and plan: Principal Problem:   Stroke [facility])  Acute left basal ganglia stroke: Presents with progressive right-sided weakness for 2 days. MRI brain Small acute perforator infarct in the left basal ganglia/overlying posterior left frontal posterior left frontal white matter.  CT angio head and neck without LVO.  TTE without significant finding.  UDS negative.  LDL 134.  A1c 6.3%.  Still with subtle RLE weakness.  Patient was on aspirin and Plavix before admission.  Per family, compliant with meds. -Continue aspirin and Brilinta per neurology -Continue statin. -PT/OT recommended SNF.  Hypotension: Episode of hypotension on 5/22.  Not on antihypertensive meds.  Hypokalemia resolved with IV fluid.  TTE reassuring. -Encourage oral hydration.  Chronic pain syndrome: PDMP reviewed.  Fills 10 mg oxy, #120 and gabapentin 800 mg tablet #90 every month.  -Continue home oxycodone -Continue gabapentin at 600 mg 3 times daily.  Takes 800 mg 3 times daily at home -Scheduled and as needed bowel regimen  Microcytic iron deficiency anemia due to chronic blood loss.  H&H relatively stable. Recent Labs    09/10/22 1615 11/03/22 1136 11/03/22 1146 11/05/22 0437 11/06/22 0634 11/08/22 0910  HGB 9.2* 9.5* 11.9* 8.7* 8.9* 9.1*  -Received IV ferric gluconate 250 mg daily x 2 -Recheck CBC in 1 week   Hypothyroidism -Continue Synthroid   Leukocytosis: Likely demargination.  Resolved.   Rectal prolapse/constipation, chronic stable -  Continue rectal prolapse care -Per granddaughter, told to be not a candidate for surgical intervention previously -Bowel regimen   Dementia without behavior disturbance: Stable. -Reorientation and delirium   PAD, chronic stable -Aspirin, Brilinta and statin as above -Continue home memantine   GERD: Reports reflux. -Continue Protonix 40 mg daily -GI cocktail as  needed  COPD, chronic stable -Nebs as needed  Body mass index is 22.99 kg/m.         DVT prophylaxis:  Place and maintain sequential compression device Start: 11/03/22 1717  Code Status: Full code Family Communication: None at bedside. Level of care: Med-Surg Status is: Inpatient Remains inpatient appropriate because: SNF bed/placement   Final disposition: SNF Consultants:  Neurology  35 minutes with more than 50% spent in reviewing records, counseling patient/family and coordinating care.   Sch Meds:  Scheduled Meds:  acetaminophen  1,000 mg Oral Q8H   aspirin EC  81 mg Oral Daily   atorvastatin  40 mg Oral Daily   gabapentin  600 mg Oral TID   levothyroxine  75 mcg Oral Q0600   lidocaine  1 patch Transdermal Q24H   pantoprazole  40 mg Oral Daily   pramipexole  0.25 mg Oral TID   senna-docusate  1 tablet Oral BID   ticagrelor  90 mg Oral BID   Continuous Infusions:   PRN Meds:.alum & mag hydroxide-simeth, ondansetron (ZOFRAN) IV, oxyCODONE, [COMPLETED] polyethylene glycol **FOLLOWED BY** polyethylene glycol  Antimicrobials: Anti-infectives (From admission, onward)    None        I have personally reviewed the following labs and images: CBC: Recent Labs  Lab 11/03/22 1136 11/03/22 1146 11/05/22 0437 11/06/22 0634 11/08/22 0910  WBC 10.9*  --  9.5 8.6 7.5  NEUTROABS 6.7  --   --   --   --   HGB 9.5* 11.9* 8.7* 8.9* 9.1*  HCT 31.7* 35.0* 28.8* 29.4* 30.1*  MCV 77.9*  --  76.2* 76.8* 76.6*  PLT 460*  --  407* 405* 392   BMP &GFR Recent Labs  Lab 11/03/22 1136 11/03/22 1146 11/05/22 0437 11/06/22 0634 11/08/22 0910  NA 134* 136 137 134* 133*  K 4.2 4.4 3.8 3.8 3.6  CL 96* 94* 101 98 96*  CO2 30  --  28 27 25   GLUCOSE 101* 97 104* 96 131*  BUN 19 20 22 17 14   CREATININE 0.85 0.90 1.14* 1.12* 0.98  CALCIUM 9.0  --  8.4* 8.7* 8.9  MG  --   --  2.1 2.2 2.0  PHOS  --   --  4.7* 4.3 4.0   Estimated Creatinine Clearance: 31.1 mL/min (by  C-G formula based on SCr of 0.98 mg/dL). Liver & Pancreas: Recent Labs  Lab 11/03/22 1136 11/05/22 0437 11/06/22 0634 11/08/22 0910  AST 22  --   --   --   ALT 18  --   --   --   ALKPHOS 86  --   --   --   BILITOT 0.5  --   --   --   PROT 7.7  --   --   --   ALBUMIN 3.8 2.9* 3.1* 3.4*   No results for input(s): "LIPASE", "AMYLASE" in the last 168 hours. No results for input(s): "AMMONIA" in the last 168 hours. Diabetic: No results for input(s): "HGBA1C" in the last 72 hours.  No results for input(s): "GLUCAP" in the last 168 hours. Cardiac Enzymes: No results for input(s): "CKTOTAL", "CKMB", "CKMBINDEX", "TROPONINI" in the last  168 hours. No results for input(s): "PROBNP" in the last 8760 hours. Coagulation Profile: Recent Labs  Lab 11/03/22 1136  INR 0.9   Thyroid Function Tests: No results for input(s): "TSH", "T4TOTAL", "FREET4", "T3FREE", "THYROIDAB" in the last 72 hours. Lipid Profile: No results for input(s): "CHOL", "HDL", "LDLCALC", "TRIG", "CHOLHDL", "LDLDIRECT" in the last 72 hours.  Anemia Panel: No results for input(s): "VITAMINB12", "FOLATE", "FERRITIN", "TIBC", "IRON", "RETICCTPCT" in the last 72 hours.  Urine analysis:    Component Value Date/Time   COLORURINE STRAW (A) 11/03/2022 1138   APPEARANCEUR CLEAR 11/03/2022 1138   LABSPEC 1.003 (L) 11/03/2022 1138   PHURINE 7.0 11/03/2022 1138   GLUCOSEU NEGATIVE 11/03/2022 1138   HGBUR MODERATE (A) 11/03/2022 1138   BILIRUBINUR NEGATIVE 11/03/2022 1138   KETONESUR NEGATIVE 11/03/2022 1138   PROTEINUR NEGATIVE 11/03/2022 1138   NITRITE NEGATIVE 11/03/2022 1138   LEUKOCYTESUR TRACE (A) 11/03/2022 1138   Sepsis Labs: Invalid input(s): "PROCALCITONIN", "LACTICIDVEN"  Microbiology: No results found for this or any previous visit (from the past 240 hour(s)).  Radiology Studies: No results found.    Jeannemarie Sawaya T. Louna Rothgeb [facility]  If 7PM-7AM, please contact  night-coverage [email or web address] 11/08/2022, 11:59 AM

## 2022-11-10 DIAGNOSIS — I959 Hypotension, unspecified: Secondary | ICD-10-CM | POA: Diagnosis not present

## 2022-11-10 DIAGNOSIS — M545 Low back pain, unspecified: Secondary | ICD-10-CM | POA: Diagnosis not present

## 2022-11-10 DIAGNOSIS — D509 Iron deficiency anemia, unspecified: Secondary | ICD-10-CM | POA: Diagnosis not present

## 2022-11-10 DIAGNOSIS — I639 Cerebral infarction, unspecified: Secondary | ICD-10-CM | POA: Diagnosis not present

## 2022-11-10 NOTE — TOC Progression Note (Signed)
Transition of Care [facility]) - Progression Note    Patient Details  Name: Amy Mckay MRN: [id number] Date of Birth: 08/24/1934  Transition of Care [facility] CM/SW Contact  Baldemar Lenis, [place] Phone Number: 11/10/2022, 1:25 PM  Clinical Narrative:   CSW attempted to reach granddaughter to discuss SNF choice, left a voicemail, awaiting call back.    Expected Discharge Plan: Skilled Nursing Facility Barriers to Discharge: Continued Medical Work up, English as a second language teacher  Expected Discharge Plan and Services     Post Acute Care Choice: Skilled Nursing Facility Living arrangements for the past 2 months: Single Family Home                                       Social Determinants of Health (SDOH) Interventions SDOH Screenings   Food Insecurity: No Food Insecurity (11/03/2022)  Housing: Low Risk  (11/03/2022)  Transportation Needs: No Transportation Needs (11/03/2022)  Utilities: Not At Risk (11/03/2022)  Tobacco Use: High Risk (11/03/2022)    Readmission Risk Interventions     No data to display

## 2022-11-10 NOTE — Progress Notes (Signed)
PROGRESS NOTE  Amy Mckay ZOX:[id number] DOB: 1934/08/01   PCP: Annita Brod, MD  Patient is from: Home.  DOA: 11/03/2022 LOS: 7  Chief complaints Chief Complaint  Patient presents with   Numbness     Brief Narrative / Interim history: 88 y.o. female with medical history significant of dementia, PAD, COPD, HTN, GERD and rectal prolapse presented to ED with progressive right-sided weakness for about 2 days and found to have left basal ganglia infarct. MRI brain Small acute perforator infarct in the left basal ganglia/overlying posterior left frontal posterior left frontal white matter.  CT angio head and neck with no emergent LVO.  LE venous Doppler negative for DVT bilaterally.  TTE without significant finding.  A1c 6.3%.  LDL 134.  Patient was on aspirin and Plavix prior to this hospitalization.  Neurology recommended Brilinta and aspirin.   Hospital course complicated by hypotension that has resolved with IV fluid.  Therapy recommended SNF.  Medically stable for discharge.  Subjective: Seen and examined earlier this morning.  No major events overnight of this morning.  No complaints.  Objective: Vitals:   11/10/22 0040 11/10/22 0404 11/10/22 0743 11/10/22 0800  BP: 95/82 130/89 122/78   Pulse: 89 85 80   Resp: 16 16 16    Temp: 98.2 F (36.8 C) 97.8 F (36.6 C) 98.3 F (36.8 C)   TempSrc: Oral Oral Oral   SpO2: 93% 100% 99% 99%  Weight:      Height:        Examination:   GENERAL: No apparent distress.  Nontoxic. HEENT: MMM.  Vision and hearing grossly intact.  NECK: Supple.  No apparent JVD.  RESP:  No IWOB.  Fair aeration bilaterally. CVS:  RRR. Heart sounds normal.  ABD/GI/GU: BS+. Abd soft, NTND.  MSK/EXT:   No apparent deformity. Moves extremities. No edema.  SKIN: no apparent skin lesion or wound NEURO: Awake and alert. Oriented fairly.  No apparent focal neuro deficit. PSYCH: Calm. Normal affect.   Procedures:  None  Microbiology  summarized: None  Assessment and plan: Principal Problem:   Stroke [facility])  Acute left basal ganglia stroke: Presents with progressive right-sided weakness for 2 days. MRI brain Small acute perforator infarct in the left basal ganglia/overlying posterior left frontal posterior left frontal white matter.  CT angio head and neck without LVO.  TTE without significant finding.  UDS negative.  LDL 134.  A1c 6.3%.  Still with subtle RLE weakness.  Patient was on aspirin and Plavix before admission.  Per family, compliant with meds. -Continue aspirin and Brilinta per neurology -Continue statin. -PT/OT recommended SNF.  Hypotension: Episode of hypotension on 5/22.  Not on antihypertensive meds.  Hypokalemia resolved with IV fluid.  TTE reassuring. -Encourage oral hydration.  Chronic pain syndrome: PDMP reviewed.  Fills 10 mg oxy, #120 and gabapentin 800 mg tablet #90 every month.  -Continue home oxycodone and gabapentin. -Bowel regimen as needed.  Microcytic iron deficiency anemia due to chronic blood loss.  H&H relatively stable. Recent Labs    09/10/22 1615 11/03/22 1136 11/03/22 1146 11/05/22 0437 11/06/22 0634 11/08/22 0910  HGB 9.2* 9.5* 11.9* 8.7* 8.9* 9.1*  -Received IV ferric gluconate 250 mg daily x 2 -Recheck CBC in 1 week   Hypothyroidism -Continue Synthroid   Leukocytosis: Likely demargination.  Resolved.   Rectal prolapse/constipation: Per granddaughter, told to be not a candidate for surgical intervention previously -Bowel regimen   Dementia without behavior disturbance: Stable. -Reorientation and delirium   PAD, chronic stable -Aspirin,  Brilinta and statin as above -Continue home memantine   GERD: Reports reflux. -Continue Protonix 40 mg daily -GI cocktail as needed  COPD, chronic stable -Nebs as needed  Body mass index is 22.99 kg/m.         DVT prophylaxis:  Place and maintain sequential compression device Start: 11/03/22 1717  Code Status: Full  code Family Communication: None at bedside. Level of care: Med-Surg Status is: Inpatient Remains inpatient appropriate because: SNF bed/placement   Final disposition: SNF Consultants:  Neurology  35 minutes with more than 50% spent in reviewing records, counseling patient/family and coordinating care.   Sch Meds:  Scheduled Meds:  acetaminophen  1,000 mg Oral Q8H   aspirin EC  81 mg Oral Daily   atorvastatin  40 mg Oral Daily   gabapentin  600 mg Oral TID   levothyroxine  75 mcg Oral Q0600   lidocaine  1 patch Transdermal Q24H   pantoprazole  40 mg Oral Daily   pramipexole  0.25 mg Oral TID   ticagrelor  90 mg Oral BID   Continuous Infusions:   PRN Meds:.alum & mag hydroxide-simeth, ondansetron (ZOFRAN) IV, oxyCODONE, [COMPLETED] polyethylene glycol **FOLLOWED BY** polyethylene glycol, senna-docusate  Antimicrobials: Anti-infectives (From admission, onward)    None        I have personally reviewed the following labs and images: CBC: Recent Labs  Lab 11/03/22 1136 11/03/22 1146 11/05/22 0437 11/06/22 0634 11/08/22 0910  WBC 10.9*  --  9.5 8.6 7.5  NEUTROABS 6.7  --   --   --   --   HGB 9.5* 11.9* 8.7* 8.9* 9.1*  HCT 31.7* 35.0* 28.8* 29.4* 30.1*  MCV 77.9*  --  76.2* 76.8* 76.6*  PLT 460*  --  407* 405* 392   BMP &GFR Recent Labs  Lab 11/03/22 1136 11/03/22 1146 11/05/22 0437 11/06/22 0634 11/08/22 0910  NA 134* 136 137 134* 133*  K 4.2 4.4 3.8 3.8 3.6  CL 96* 94* 101 98 96*  CO2 30  --  28 27 25   GLUCOSE 101* 97 104* 96 131*  BUN 19 20 22 17 14   CREATININE 0.85 0.90 1.14* 1.12* 0.98  CALCIUM 9.0  --  8.4* 8.7* 8.9  MG  --   --  2.1 2.2 2.0  PHOS  --   --  4.7* 4.3 4.0   Estimated Creatinine Clearance: 31.1 mL/min (by C-G formula based on SCr of 0.98 mg/dL). Liver & Pancreas: Recent Labs  Lab 11/03/22 1136 11/05/22 0437 11/06/22 0634 11/08/22 0910  AST 22  --   --   --   ALT 18  --   --   --   ALKPHOS 86  --   --   --   BILITOT 0.5   --   --   --   PROT 7.7  --   --   --   ALBUMIN 3.8 2.9* 3.1* 3.4*   No results for input(s): "LIPASE", "AMYLASE" in the last 168 hours. No results for input(s): "AMMONIA" in the last 168 hours. Diabetic: No results for input(s): "HGBA1C" in the last 72 hours.  No results for input(s): "GLUCAP" in the last 168 hours. Cardiac Enzymes: No results for input(s): "CKTOTAL", "CKMB", "CKMBINDEX", "TROPONINI" in the last 168 hours. No results for input(s): "PROBNP" in the last 8760 hours. Coagulation Profile: Recent Labs  Lab 11/03/22 1136  INR 0.9   Thyroid Function Tests: No results for input(s): "TSH", "T4TOTAL", "FREET4", "T3FREE", "THYROIDAB" in the last 72  hours. Lipid Profile: No results for input(s): "CHOL", "HDL", "LDLCALC", "TRIG", "CHOLHDL", "LDLDIRECT" in the last 72 hours.  Anemia Panel: No results for input(s): "VITAMINB12", "FOLATE", "FERRITIN", "TIBC", "IRON", "RETICCTPCT" in the last 72 hours.  Urine analysis:    Component Value Date/Time   COLORURINE STRAW (A) 11/03/2022 1138   APPEARANCEUR CLEAR 11/03/2022 1138   LABSPEC 1.003 (L) 11/03/2022 1138   PHURINE 7.0 11/03/2022 1138   GLUCOSEU NEGATIVE 11/03/2022 1138   HGBUR MODERATE (A) 11/03/2022 1138   BILIRUBINUR NEGATIVE 11/03/2022 1138   KETONESUR NEGATIVE 11/03/2022 1138   PROTEINUR NEGATIVE 11/03/2022 1138   NITRITE NEGATIVE 11/03/2022 1138   LEUKOCYTESUR TRACE (A) 11/03/2022 1138   Sepsis Labs: Invalid input(s): "PROCALCITONIN", "LACTICIDVEN"  Microbiology: No results found for this or any previous visit (from the past 240 hour(s)).  Radiology Studies: No results found.    Quamere Mussell T. Zeriah Baysinger [facility]  If 7PM-7AM, please contact night-coverage [email or web address] 11/10/2022, 11:03 AM

## 2022-11-10 NOTE — Progress Notes (Signed)
Occupational Therapy Treatment Patient Details Name: Amy Mckay MRN: [id number] DOB: [birth year] Today's Date: 11/10/2022   History of present illness Amy Mckay is a 88 y.o. female who presented 11/03/22 with right-sided weakness. 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 PMH: PAD, COPD, HTN, GERD, rectal prolapse and dementia   OT comments  Pt politely declined OOB attempts due to reports of discomfort with prolapsed rectum, bowel incontinence and endurance deficits. Educated on benefits of therapy to address endurance with pt agreeable for UE HEP education to maximize UB strength. Pt able to return demo 4/4 exercises with emphasis on breathing techniques and gradual increase of repetitions. Provided written handout to maximize carryover.   Recommendations for follow up therapy are one component of a multi-disciplinary discharge planning process, led by the attending physician.  Recommendations may be updated based on patient status, additional functional criteria and insurance authorization.    Assistance Recommended at Discharge Set up Supervision/Assistance  Patient can return home with the following  A little help with bathing/dressing/bathroom;Assistance with cooking/housework;Direct supervision/assist for medications management;Direct supervision/assist for financial management;Assist for transportation   Equipment Recommendations  None recommended by OT    Recommendations for Other Services      Precautions / Restrictions Precautions Precautions: Fall;Other (comment) Precaution Comments: chronic prolapsed rectum Restrictions Weight Bearing Restrictions: No       Mobility Bed Mobility               General bed mobility comments: declined    Transfers                         Balance                                           ADL either  performed or assessed with clinical judgement   ADL Overall ADL's : Needs assistance/impaired                                       General ADL Comments: Pt politely declined OOB tasks due to pain then felt need to sit on BSC and then pending  lunch. Discussed pt goals, participation to improve independence. Educated on UE HEP w/ good carryover - emphasis on breathing techniques and gradual increase of repetitions    Extremity/Trunk Assessment Upper Extremity Assessment Upper Extremity Assessment: Generalized weakness RUE Deficits / Details: reports initially some strength and coordination issues with initial admission though much improved per pt. appears functional for all tasks   Lower Extremity Assessment Lower Extremity Assessment: Defer to PT evaluation        Vision   Vision Assessment?: No apparent visual deficits   Perception     Praxis      Cognition Arousal/Alertness: Awake/alert Behavior During Therapy: WFL for tasks assessed/performed Overall Cognitive Status: History of cognitive impairments - at baseline                                 General Comments: per chart, pt with dementia at baseline, no family to present. follows commands, repeats self occasionally, easily distracted and hyperverbose at times. pleasant  Exercises Exercises: General Upper Extremity General Exercises - Upper Extremity Shoulder Flexion: Strengthening, Both, 5 reps, Theraband Theraband Level (Shoulder Flexion): Level 2 (Red) Shoulder Horizontal ABduction: Strengthening, Both, 10 reps, Theraband Theraband Level (Shoulder Horizontal Abduction): Level 2 (Red) Elbow Flexion: Strengthening, Both, 10 reps, Theraband Theraband Level (Elbow Flexion): Level 2 (Red) Elbow Extension: Both, Strengthening, 10 reps, Theraband Theraband Level (Elbow Extension): Level 2 (Red)    Shoulder Instructions       General Comments On OT exit, pt reporting plan to  call out for nursing assist to [facility]. OT offered to assist pt with this though she politely declined and reported preference for nursing so they would be able to bring all needed things to assist with prolapsed rectum    Pertinent Vitals/ Pain       Pain Assessment Pain Assessment: Faces Faces Pain Scale: Hurts little more Pain Location: rectum Pain Descriptors / Indicators: Grimacing, Guarding Pain Intervention(s): Monitored during session, Premedicated before session  Home Living                                          Prior Functioning/Environment              Frequency  Min 2X/week        Progress Toward Goals  OT Goals(current goals can now be found in the care plan section)  Progress towards OT goals: OT to reassess next treatment  Acute Rehab OT Goals Patient Stated Goal: resolve prolapsed rectum OT Goal Formulation: With patient Time For Goal Achievement: 11/18/22 Potential to Achieve Goals: Good ADL Goals Pt Will Perform Grooming: with modified independence;standing Pt Will Transfer to Toilet: with modified independence;ambulating Additional ADL Goal #1: Pt will demonstrate independence with 3 fall prevention strategies for safe engagement in ADL.  Plan Discharge plan remains appropriate    Co-evaluation                 AM-PAC OT "6 Clicks" Daily Activity     Outcome Measure   Help from another person eating meals?: None Help from another person taking care of personal grooming?: A Little Help from another person toileting, which includes using toliet, bedpan, or urinal?: A Little Help from another person bathing (including washing, rinsing, drying)?: A Little Help from another person to put on and taking off regular upper body clothing?: A Little Help from another person to put on and taking off regular lower body clothing?: A Little 6 Click Score: 19    End of Session    OT Visit Diagnosis: Other abnormalities of gait and  mobility (R26.89);Muscle weakness (generalized) (M62.81)   Activity Tolerance Patient limited by pain   Patient Left in bed;with call bell/phone within reach;with bed alarm set   Nurse Communication          Time: 4098-1191 OT Time Calculation (min): 18 min  Charges: OT General Charges $OT Visit: 1 Visit OT Treatments $Therapeutic Exercise: 8-22 mins  Bradd Canary, OTR/L Acute Rehab Services Office: [phone]   Lorre Munroe 11/10/2022, 1:07 PM

## 2022-11-10 NOTE — Plan of Care (Signed)

## 2022-11-11 DIAGNOSIS — D649 Anemia, unspecified: Secondary | ICD-10-CM

## 2022-11-11 DIAGNOSIS — I639 Cerebral infarction, unspecified: Secondary | ICD-10-CM | POA: Diagnosis not present

## 2022-11-11 NOTE — Plan of Care (Signed)
  Problem: Education: Goal: Knowledge of disease or condition will improve Outcome: Progressing Goal: Knowledge of patient specific risk factors will improve Amy Mckay N/A or DELETE if not current risk factor) Outcome: Progressing   Problem: Ischemic Stroke/TIA Tissue Perfusion: Goal: Complications of ischemic stroke/TIA will be minimized Outcome: Progressing   Problem: Coping: Goal: Will verbalize positive feelings about self Outcome: Progressing Goal: Will identify appropriate support needs Outcome: Progressing   Problem: Health Behavior/Discharge Planning: Goal: Ability to manage health-related needs will improve Outcome: Progressing Goal: Goals will be collaboratively established with patient/family Outcome: Progressing   Problem: Self-Care: Goal: Ability to participate in self-care as condition permits will improve Outcome: Progressing   Problem: Nutrition: Goal: Risk of aspiration will decrease Outcome: Progressing Goal: Dietary intake will improve Outcome: Progressing   Problem: Clinical Measurements: Goal: Will remain free from infection Outcome: Progressing

## 2022-11-11 NOTE — Progress Notes (Signed)
Physical Therapy Treatment Patient Details Name: Amy Mckay MRN: [id number] DOB: 08/19/1934 Today's Date: 11/11/2022   History of Present Illness Amy Mckay is a 88 y.o. female who presented 11/03/22 with right-sided weakness. 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 PMH: PAD, COPD, HTN, GERD, rectal prolapse and dementia    PT Comments    Pt demonstrated good progress with her endurance through ambulating further without a seated rest break today. However, as pt fatigued she needed minA intermittently for balance when turning and after ambulating she was so fatigued she declined further attempts at mobility or exercises. Provided pt with therabands and encouraged pt to perform exercises throughout the day to further improve her strength and endurance and prevent deconditioning. Will continue to follow acutely.     Recommendations for follow up therapy are one component of a multi-disciplinary discharge planning process, led by the attending physician.  Recommendations may be updated based on patient status, additional functional criteria and insurance authorization.  Follow Up Recommendations  Can patient physically be transported by private vehicle: Yes    Assistance Recommended at Discharge Intermittent Supervision/Assistance  Patient can return home with the following A little help with walking and/or transfers;A little help with bathing/dressing/bathroom;Assistance with cooking/housework;Assist for transportation;Help with stairs or ramp for entrance;Direct supervision/assist for financial management;Direct supervision/assist for medications management   Equipment Recommendations  Other (comment) (shower chair)    Recommendations for Other Services       Precautions / Restrictions Precautions Precautions: Fall;Other (comment) Precaution Comments: chronic prolapsed  rectum Restrictions Weight Bearing Restrictions: No     Mobility  Bed Mobility Overal bed mobility: Needs Assistance Bed Mobility: Supine to Sit, Sit to Supine     Supine to sit: Supervision, HOB elevated Sit to supine: Supervision, HOB elevated   General bed mobility comments: pt able to come to EOB as well as position self back in bed after ambulation without assist, supervision for safety    Transfers Overall transfer level: Needs assistance Equipment used: Rolling walker (2 wheels) Transfers: Sit to/from Stand Sit to Stand: Min assist           General transfer comment: Pt pulling up on RW to stand from EOB, minA for balance and safety    Ambulation/Gait Ambulation/Gait assistance: Min guard, Min assist Gait Distance (Feet): 160 Feet Assistive device: Rolling walker (2 wheels) Gait Pattern/deviations: Step-through pattern, Decreased stride length, Trunk flexed, Decreased dorsiflexion - right (scoliosis of spine) Gait velocity: reduced Gait velocity interpretation: <1.8 ft/sec, indicate of risk for recurrent falls   General Gait Details: Pt takes slow, small steps with a kyphotic posture, no LOB. Chair taken with pt for safety due to hx of fatiguing quickly this admission, but no rest break needed today. However, pt too fatigued after ambulating to perform exercises. Noted R foot dragging initially but improved with cues. Min guard majority of time, but intermittent minA for balance when turning and cues to keep feet within RW when turning.   Stairs             Wheelchair Mobility    Modified Rankin (Stroke Patients Only) Modified Rankin (Stroke Patients Only) Pre-Morbid Rankin Score: Slight disability Modified Rankin: Moderately severe disability     Balance Overall balance assessment: Needs assistance Sitting-balance support: Feet supported, No upper extremity supported Sitting balance-Leahy Scale: Good     Standing balance support: Bilateral upper  extremity supported, During functional activity, Single extremity supported,  No upper extremity supported Standing balance-Leahy Scale: Fair Standing balance comment: Able to stand without UE support and reach mildly off BOS without LOB but tends to prefer at least 1 UE support for increased dynamic balance challenges and to ambulate                            Cognition Arousal/Alertness: Awake/alert Behavior During Therapy: WFL for tasks assessed/performed Overall Cognitive Status: History of cognitive impairments - at baseline                                 General Comments: per chart, pt with dementia at baseline, no family present. follows commands, repeats self occasionally        Exercises      General Comments General comments (skin integrity, edema, etc.): encouraged pt to mobilize frequently and perform exercises as able while admitted to prevent deconditioning and muscle atrophy. Provided pt with therabands, but pt then placing them to the side and stated "I will use these when I get out of here", thus re-educated pt to use them while here also      Pertinent Vitals/Pain Pain Assessment Pain Assessment: Faces Faces Pain Scale: Hurts little more Pain Location: rectum Pain Descriptors / Indicators: Grimacing, Guarding, Discomfort Pain Intervention(s): Limited activity within patient's tolerance, Monitored during session, Repositioned    Home Living                          Prior Function            PT Goals (current goals can now be found in the care plan section) Acute Rehab PT Goals Patient Stated Goal: to improve PT Goal Formulation: With patient Time For Goal Achievement: 11/18/22 Potential to Achieve Goals: Good Progress towards PT goals: Progressing toward goals    Frequency    Min 3X/week      PT Plan Current plan remains appropriate    Co-evaluation              AM-PAC PT "6 Clicks" Mobility   Outcome  Measure  Help needed turning from your back to your side while in a flat bed without using bedrails?: A Little Help needed moving from lying on your back to sitting on the side of a flat bed without using bedrails?: A Little Help needed moving to and from a bed to a chair (including a wheelchair)?: A Little Help needed standing up from a chair using your arms (e.g., wheelchair or bedside chair)?: A Little Help needed to walk in hospital room?: A Little Help needed climbing 3-5 steps with a railing? : A Little 6 Click Score: 18    End of Session Equipment Utilized During Treatment: Gait belt Activity Tolerance: Patient tolerated treatment well Patient left: with call bell/phone within reach;in bed;with bed alarm set   PT Visit Diagnosis: Unsteadiness on feet (R26.81);Other abnormalities of gait and mobility (R26.89);Muscle weakness (generalized) (M62.81);Difficulty in walking, not elsewhere classified (R26.2);Other symptoms and signs involving the nervous system (R29.898)     Time: 4098-1191 PT Time Calculation (min) (ACUTE ONLY): 21 min  Charges:  $Gait Training: 8-22 mins                     Raymond Gurney, PT, DPT Acute Rehabilitation Services  Office: [phone]    Amy Mckay 11/11/2022,  3:26 PM

## 2022-11-11 NOTE — TOC Progression Note (Signed)
Transition of Care [facility]) - Progression Note    Patient Details  Name: Amy Mckay MRN: [id number] Date of Birth: 04-06-1935  Transition of Care [facility]) CM/SW Contact  Baldemar Lenis, [place] Phone Number: 11/11/2022, 4:53 PM  Clinical Narrative:   CSW received contact back from granddaughter after hours yesterday asking about [facility]. [facility] had previously declined due to no beds, but CSW asked about availability and they are able to accept patient. CSW sent request for insurance authorization, pending at this time. CSW to follow.    Expected Discharge Plan: Skilled Nursing Facility Barriers to Discharge: Continued Medical Work up, English as a second language teacher  Expected Discharge Plan and Services     Post Acute Care Choice: Skilled Nursing Facility Living arrangements for the past 2 months: Single Family Home                                       Social Determinants of Health (SDOH) Interventions SDOH Screenings   Food Insecurity: No Food Insecurity (11/03/2022)  Housing: Low Risk  (11/03/2022)  Transportation Needs: No Transportation Needs (11/03/2022)  Utilities: Not At Risk (11/03/2022)  Tobacco Use: High Risk (11/03/2022)    Readmission Risk Interventions     No data to display

## 2022-11-11 NOTE — Discharge Summary (Signed)
Physician Discharge Summary   Patient: Amy Mckay MRN: [id number] DOB: [birth year]  Admit date:     11/03/2022  Discharge date: 11/12/22  Discharge Physician: Kathlen Mody   PCP: Annita Brod, MD   Recommendations at discharge:  Please follow up with PCp in one week.  Please follow up with neurology as recommended.   Discharge Diagnoses: Principal Problem:   Stroke [facility])    Hospital Course:  88 y.o. female with medical history significant of dementia, PAD, COPD, HTN, GERD and rectal prolapse presented to ED with progressive right-sided weakness for about 2 days and found to have left basal ganglia infarct. MRI brain Small acute perforator infarct in the left basal ganglia/overlying posterior left frontal posterior left frontal white matter.  CT angio head and neck with no emergent LVO.  LE venous Doppler negative for DVT bilaterally.  TTE without significant finding.  A1c 6.3%.  LDL 134.  Patient was on aspirin and Plavix prior to this hospitalization.  Neurology recommended Brilinta and aspirin.    Hospital course complicated by hypotension that has resolved with IV fluid.  Therapy recommended SNF.  Medically stable for discharge.  Assessment and Plan:   Acute left basal ganglia stroke: Presents with progressive right-sided weakness for 2 days. MRI brain Small acute perforator infarct in the left basal ganglia/overlying posterior left frontal posterior left frontal white matter.  CT angio head and neck without LVO.  TTE without significant finding.  UDS negative.  LDL 134.  A1c 6.3%.  Still with subtle RLE weakness.  Patient was on aspirin and Plavix before admission.  Per family, compliant with meds. -Continue aspirin and Brilinta per neurology -Continue statin. -PT/OT recommended SNF.   Hypotension: Episode of hypotension on 5/22.  Not on antihypertensive meds.  Hypokalemia resolved with IV fluid.  TTE reassuring. -Encourage oral hydration.   Chronic pain syndrome: PDMP  reviewed.  Fills 10 mg oxy, #120 and gabapentin 800 mg tablet #90 every month.  -Continue home oxycodone and gabapentin. -Bowel regimen as needed.   Microcytic iron deficiency anemia due to chronic blood loss.  H&H relatively stable. Recheck cbc in one week.    Hypothyroidism -Continue Synthroid   Leukocytosis: Likely demargination.  Resolved.   Rectal prolapse/constipation: Per granddaughter, told to be not a candidate for surgical intervention previously -Bowel regimen   Dementia without behavior disturbance: Stable. -Reorientation and delirium    PAD, chronic stable -Aspirin, Brilinta and statin as above -Continue home memantine   GERD: Reports reflux. -Continue Protonix 40 mg daily -GI cocktail as needed   COPD, chronic stable -Nebs as needed       Consultants: neurology.  Procedures performed: TTE  Disposition: Skilled nursing facility Diet recommendation:  Cardiac diet DISCHARGE MEDICATION: Allergies as of 11/11/2022       Reactions   Penicillins Anaphylaxis, Swelling, Other (See Comments)   Penicillin G Hives        Medication List     STOP taking these medications    clopidogrel 75 MG tablet Commonly known as: PLAVIX   esomeprazole 40 MG capsule Commonly known as: NEXIUM   hydrocortisone 2.5 % rectal cream Commonly known as: ANUSOL-HC       TAKE these medications    albuterol 108 (90 Base) MCG/ACT inhaler Commonly known as: VENTOLIN HFA Inhale 1-2 puffs into the lungs every 6 (six) hours as needed for wheezing or shortness of breath.   ascorbic acid 500 MG tablet Commonly known as: VITAMIN C Take 500 mg by mouth daily.  aspirin EC 81 MG tablet Take 1 tablet (81 mg total) by mouth daily. Hold for 1 week. May resume 10/26/21 if remains stable with worsening anemia/GI bleeds   atorvastatin 40 MG tablet Commonly known as: LIPITOR Take 1 tablet (40 mg total) by mouth daily. What changed:  medication strength how much to take    Breo Ellipta 200-25 MCG/ACT Aepb Generic drug: fluticasone furoate-vilanterol Inhale 1 puff into the lungs daily.   CALCIUM + VITAMIN D3 PO Take 1 tablet by mouth daily.   gabapentin 800 MG tablet Commonly known as: NEURONTIN Take 800 mg by mouth 3 (three) times daily.   hydrocortisone 25 MG suppository Commonly known as: ANUSOL-HC Place 1 suppository (25 mg total) rectally 2 (two) times daily.   levothyroxine 75 MCG tablet Commonly known as: SYNTHROID Take 75 mcg by mouth daily before breakfast.   memantine 10 MG tablet Commonly known as: NAMENDA Take 10 mg by mouth 2 (two) times daily.   multivitamin with minerals Tabs tablet Take 1 tablet by mouth daily with breakfast.   Oxycodone HCl 10 MG Tabs Take 1 tablet (10 mg total) by mouth 4 (four) times daily.   pantoprazole 40 MG tablet Commonly known as: Protonix Take 1 tablet (40 mg total) by mouth 2 (two) times daily.   polyethylene glycol 17 g packet Commonly known as: MIRALAX / GLYCOLAX Take 17 g by mouth 2 (two) times daily as needed for mild constipation.   pramipexole 0.25 MG tablet Commonly known as: MIRAPEX Take 0.25 mg by mouth 3 (three) times daily.   senna-docusate 8.6-50 MG tablet Commonly known as: Senokot-S Take 1 tablet by mouth 2 (two) times daily as needed for moderate constipation.   ticagrelor 90 MG Tabs tablet Commonly known as: BRILINTA Take 1 tablet (90 mg total) by mouth 2 (two) times daily.        Follow-up Information     [facility]. Schedule an appointment as soon as possible for a visit in 4 week(s).   Specialty: Neurology Contact information: [address] [place] [address] [phone]               Discharge Exam: Ceasar Mons Weights   11/03/22 1058 11/03/22 2032  Weight: 55 kg 55.2 kg   General exam: Appears calm and comfortable  Respiratory system: Clear to auscultation. Respiratory effort  normal. Cardiovascular system: S1 & S2 heard, RRR.  Gastrointestinal system: Abdomen is nondistended, soft and nontender.  Central nervous system: Alert and oriented. No focal neurological deficits. Extremities: Symmetric 5 x 5 power. Skin: No rashes,  Psychiatry:  Mood & affect appropriate.    Condition at discharge: fair  The results of significant diagnostics from this hospitalization (including imaging, microbiology, ancillary and laboratory) are listed below for reference.   Imaging Studies: ECHOCARDIOGRAM COMPLETE  Result Date: 11/04/2022    ECHOCARDIOGRAM REPORT   Patient Name:   KATLIN GODBOUT Date of Exam: 11/04/2022 Medical Rec #:  [id number]     Height:       61.0 in Accession #:    [id number]    Weight:       121.7 lb Date of Birth:  March 01, 1935     BSA:          1.529 m Patient Age:    88 years      BP:           98/76 mmHg Patient Gender: F  HR:           93 bpm. Exam Location:  Inpatient Procedure: 2D Echo, Cardiac Doppler and Color Doppler Indications:    Stroke I63.9  History:        Patient has no prior history of Echocardiogram examinations.                 Stroke and COPD; Risk Factors:Hypertension and Current Smoker.  Sonographer:    Lucendia Herrlich Referring Phys: [id number] J PATEL IMPRESSIONS  1. Left ventricular ejection fraction, by estimation, is 60 to 65%. The left ventricle has normal function. The left ventricle has no regional wall motion abnormalities. Left ventricular diastolic parameters are consistent with Grade I diastolic dysfunction (impaired relaxation).  2. Right ventricular systolic function is normal. The right ventricular size is normal.  3. The mitral valve is normal in structure. No evidence of mitral valve regurgitation. No evidence of mitral stenosis. Moderate mitral annular calcification.  4. The aortic valve is normal in structure. Aortic valve regurgitation is not visualized. No aortic stenosis is present.  5. The inferior vena cava  is normal in size with greater than 50% respiratory variability, suggesting right atrial pressure of 3 mmHg. Conclusion(s)/Recommendation(s): No intracardiac source of embolism detected on this transthoracic study. Consider a transesophageal echocardiogram to exclude cardiac source of embolism if clinically indicated. FINDINGS  Left Ventricle: Left ventricular ejection fraction, by estimation, is 60 to 65%. The left ventricle has normal function. The left ventricle has no regional wall motion abnormalities. The left ventricular internal cavity size was normal in size. There is  no left ventricular hypertrophy. Left ventricular diastolic parameters are consistent with Grade I diastolic dysfunction (impaired relaxation). Right Ventricle: The right ventricular size is normal. No increase in right ventricular wall thickness. Right ventricular systolic function is normal. Left Atrium: Left atrial size was normal in size. Right Atrium: Right atrial size was normal in size. Pericardium: There is no evidence of pericardial effusion. Mitral Valve: The mitral valve is normal in structure. Moderate mitral annular calcification. No evidence of mitral valve regurgitation. No evidence of mitral valve stenosis. Tricuspid Valve: The tricuspid valve is normal in structure. Tricuspid valve regurgitation is not demonstrated. No evidence of tricuspid stenosis. Aortic Valve: The aortic valve is normal in structure. Aortic valve regurgitation is not visualized. No aortic stenosis is present. Aortic valve peak gradient measures 6.2 mmHg. Pulmonic Valve: The pulmonic valve was normal in structure. Pulmonic valve regurgitation is not visualized. No evidence of pulmonic stenosis. Aorta: The aortic root is normal in size and structure. Venous: The inferior vena cava is normal in size with greater than 50% respiratory variability, suggesting right atrial pressure of 3 mmHg. IAS/Shunts: No atrial level shunt detected by color flow Doppler.   LEFT VENTRICLE PLAX 2D LVIDd:         3.00 cm   Diastology LVIDs:         2.15 cm   LV e' medial:    5.55 cm/s LV PW:         1.00 cm   LV E/e' medial:  13.7 LV IVS:        0.90 cm   LV e' lateral:   9.36 cm/s LVOT diam:     1.70 cm   LV E/e' lateral: 8.1 LV SV:         39 LV SV Index:   25 LVOT Area:     2.27 cm  RIGHT VENTRICLE  IVC RV S prime:     12.50 cm/s  IVC diam: 1.50 cm TAPSE (M-mode): 2.0 cm LEFT ATRIUM             Index        RIGHT ATRIUM          Index LA diam:        2.30 cm 1.50 cm/m   RA Area:     9.31 cm LA Vol (A2C):   36.5 ml 23.87 ml/m  RA Volume:   15.60 ml 10.20 ml/m LA Vol (A4C):   22.5 ml 14.71 ml/m LA Biplane Vol: 29.2 ml 19.09 ml/m  AORTIC VALVE AV Area (Vmax): 1.72 cm AV Vmax:        124.00 cm/s AV Peak Grad:   6.2 mmHg LVOT Vmax:      93.70 cm/s LVOT Vmean:     64.800 cm/s LVOT VTI:       0.170 m  AORTA Ao Root diam: 3.40 cm Ao Asc diam:  3.40 cm MITRAL VALVE MV Area (PHT): 4.31 cm     SHUNTS MV Decel Time: 176 msec     Systemic VTI:  0.17 m MV E velocity: 75.90 cm/s   Systemic Diam: 1.70 cm MV A velocity: 138.00 cm/s MV E/A ratio:  0.55 Donato Schultz MD Electronically signed by Donato Schultz MD Signature Date/Time: 11/04/2022/1:25:03 PM    Final    VAS [place] LOWER EXTREMITY VENOUS (DVT) (ONLY [facility] & [facility])  Result Date: 11/03/2022  Lower Venous DVT Study Patient Name:  ALAYZA MERCED  Date of Exam:   11/03/2022 Medical Rec #: [id number]      Accession #:    [id number] Date of Birth: December 31, 1934      Patient Gender: F Patient Age:   54 years Exam Location:  [facility] Procedure:      VAS [place] LOWER EXTREMITY VENOUS (DVT) Referring Phys: Ernie Avena --------------------------------------------------------------------------------  Indications: Pain.  Risk Factors: None identified. Comparison Study: No prior studies. Performing Technologist: Chanda Busing RVT  Examination Guidelines: A complete evaluation includes B-mode imaging, spectral Doppler, color Doppler, and power  Doppler as needed of all accessible portions of each vessel. Bilateral testing is considered an integral part of a complete examination. Limited examinations for reoccurring indications may be performed as noted. The reflux portion of the exam is performed with the patient in reverse Trendelenburg.  +-----+---------------+---------+-----------+----------+--------------+ RIGHTCompressibilityPhasicitySpontaneityPropertiesThrombus Aging +-----+---------------+---------+-----------+----------+--------------+ CFV  Full           Yes      Yes                                 +-----+---------------+---------+-----------+----------+--------------+   +---------+---------------+---------+-----------+----------+--------------+ LEFT     CompressibilityPhasicitySpontaneityPropertiesThrombus Aging +---------+---------------+---------+-----------+----------+--------------+ CFV      Full           Yes      Yes                                 +---------+---------------+---------+-----------+----------+--------------+ SFJ      Full                                                        +---------+---------------+---------+-----------+----------+--------------+ FV Prox  Full                                                        +---------+---------------+---------+-----------+----------+--------------+ FV Mid   Full                                                        +---------+---------------+---------+-----------+----------+--------------+ FV DistalFull                                                        +---------+---------------+---------+-----------+----------+--------------+ PFV      Full                                                        +---------+---------------+---------+-----------+----------+--------------+ POP      Full           Yes      Yes                                  +---------+---------------+---------+-----------+----------+--------------+ PTV      Full                                                        +---------+---------------+---------+-----------+----------+--------------+ PERO     Full                                                        +---------+---------------+---------+-----------+----------+--------------+    Summary: RIGHT: - No evidence of common femoral vein obstruction.  LEFT: - There is no evidence of deep vein thrombosis in the lower extremity.  - No cystic structure found in the popliteal fossa.  *See table(s) above for measurements and observations. Electronically signed by Coral Else MD on 11/03/2022 at 7:16:16 PM.    Final    CT ANGIO HEAD NECK W WO CM  Result Date: 11/03/2022 CLINICAL DATA:  Stroke/TIA, determine embolic source EXAM: CT ANGIOGRAPHY HEAD AND NECK WITH AND WITHOUT CONTRAST TECHNIQUE: Multidetector CT imaging of the head and neck was performed using the standard protocol during bolus administration of intravenous contrast. Multiplanar CT image reconstructions and MIPs were obtained to evaluate the vascular anatomy. Carotid stenosis measurements (when applicable) are obtained utilizing NASCET criteria, using the distal internal carotid diameter as the denominator. RADIATION DOSE REDUCTION: This exam was performed according to the departmental dose-optimization program which includes automated exposure control, adjustment of the mA and/or kV according to patient size and/or use of iterative reconstruction  technique. CONTRAST:  OMNIPAQUE IOHEXOL 350 MG/ML SOLN COMPARISON:  MRI head from today. FINDINGS: CTA NECK FINDINGS Aortic arch: Visualized great vessel origins are patent. Aortic atherosclerosis. Right carotid system: Atherosclerosis at the carotid bifurcation without greater than 50% stenosis. Left carotid system: Atherosclerosis at the carotid bifurcation with approximately 45-50% stenosis. Vertebral  arteries: Multifocal moderate stenosis of the right vertebral artery. Multifocal irregularity and mild narrowing of the left vertebral artery, likely due to atherosclerosis. Skeleton: Negative. Other neck: Negative. Upper chest: Emphysema.  Calcified granuloma in the left upper lobe. Review of the MIP images confirms the above findings CTA HEAD FINDINGS Anterior circulation: Bilateral intracranial ICAs, MCAs and ACAs are patent without proximal hemodynamically significant stenosis. Posterior circulation: Bilateral intradural vertebral arteries, basilar artery and posterior cerebral arteries are patent. Multifocal mild stenosis of the intradural vertebral arteries and moderate stenosis of the proximal basilar artery. Moderate right posterior cerebral artery stenosis. Venous sinuses: As permitted by contrast timing, patent. Review of the MIP images confirms the above findings IMPRESSION: CTA head: 1. No emergent large vessel occlusion. 2. 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 3. Moderate stenosis of the right PCA. CTA neck: 1. Approximately 45-50% stenosis of the left ICA origin. 2. Multifocal moderate right and mild left vertebral artery stenosis/irregularity. 3. Aortic Atherosclerosis (ICD10-I70.0) and Emphysema (ICD10-J43.9). Electronically Signed   By: Feliberto Harts M.D.   On: 11/03/2022 16:07   CT ABDOMEN PELVIS W CONTRAST  Result Date: 11/03/2022 CLINICAL DATA:  Abdominal pain, right-sided numbness and weakness since yesterday EXAM: CT ABDOMEN AND PELVIS WITH CONTRAST TECHNIQUE: Multidetector CT imaging of the abdomen and pelvis was performed using the standard protocol following bolus administration of intravenous contrast. Because of scanner malfunction, imaging was performed 3 minutes after contrast injection. RADIATION DOSE REDUCTION: This exam was performed according to the departmental dose-optimization program which includes automated exposure control, adjustment of the mA and/or kV  according to patient size and/or use of iterative reconstruction technique. CONTRAST:  OMNIPAQUE IOHEXOL 350 MG/ML SOLN COMPARISON:  09/10/2022 FINDINGS: Lower chest: No acute pleural or parenchymal lung disease. Hepatobiliary: The liver is unremarkable. Gallbladder is fairly decompressed, with nonspecific areas of borderline gallbladder wall thickening measuring up to 4-5 mm. No evidence of cholelithiasis. No biliary duct dilation. Pancreas: Unremarkable. No pancreatic ductal dilatation or surrounding inflammatory changes. Spleen: Normal in size without focal abnormality. Adrenals/Urinary Tract: Excreted contrast within the kidneys, ureters, and bladder related to delayed imaging due to scanner malfunction. No filling defects. Kidneys appear unremarkable. The adrenals and bladder are unremarkable. Stomach/Bowel: No bowel obstruction or ileus. Moderate retained stool throughout the colon. Scattered colonic diverticulosis with no evidence of diverticulitis. Pelvic floor laxity, with pronounced rectal prolapse. Small hiatal hernia. Vascular/Lymphatic: Aortic atherosclerosis. No enlarged abdominal or pelvic lymph nodes. Reproductive: Status post hysterectomy. No adnexal masses. Other: No free fluid or free intraperitoneal gas. No abdominal wall hernia. Musculoskeletal: No acute or destructive bony abnormalities. Reconstructed images demonstrate no additional findings. IMPRESSION: 1. Pelvic floor laxity, with pronounced rectal prolapse. 2. Nonspecific gallbladder wall thickening given decompressed state of the gallbladder. No evidence of cholelithiasis. If further evaluation is clinically indicated, right upper quadrant ultrasound could be considered. 3. Moderate retained stool throughout the colon consistent with constipation. No bowel obstruction or ileus. 4. Scattered colonic diverticulosis without diverticulitis. 5.  Aortic Atherosclerosis (ICD10-I70.0). Electronically Signed   By: Sharlet Salina M.D.   On:  11/03/2022 15:56   MR BRAIN WO CONTRAST  Result Date: 11/03/2022 EXAM: MRI HEAD WITHOUT  CONTRAST TECHNIQUE: Multiplanar, multiecho pulse sequences of the brain and surrounding structures were obtained without intravenous contrast. COMPARISON:  CT head from today. FINDINGS: Brain: Small acute perforator infarct in the left basal ganglia/overlying posterior left frontal white matter. Mild edema without mass effect. No evidence of acute hemorrhage, mass lesion, midline shift or hydrocephalus. Scattered T2/FLAIR hyperintensities within white matter, compatible with mild for age chronic microvascular ischemic change Vascular: Major arterial flow voids are maintained at the skull base. Skull and upper cervical spine: Normal marrow signal. Sinuses/Orbits: Clear sinuses.  No acute orbital findings. Other: No mastoid effusions. IMPRESSION: Small acute perforator infarct in the left basal ganglia/overlying posterior left frontal posterior left frontal white matter. Electronically Signed   By: Feliberto Harts M.D.   On: 11/03/2022 14:33   CT HEAD WO CONTRAST  Result Date: 11/03/2022 CLINICAL DATA:  Neuro deficit, acute, stroke suspected EXAM: CT HEAD WITHOUT CONTRAST TECHNIQUE: Contiguous axial images were obtained from the base of the skull through the vertex without intravenous contrast. RADIATION DOSE REDUCTION: This exam was performed according to the departmental dose-optimization program which includes automated exposure control, adjustment of the mA and/or kV according to patient size and/or use of iterative reconstruction technique. COMPARISON:  CT Head Oct 13, 2021. FINDINGS: Brain: No evidence of acute infarction, hemorrhage, hydrocephalus, extra-axial collection or mass lesion/mass effect. Cerebral atrophy. Remote right thalamic and caudate infarcts. Vascular: No hyperdense vessel. Skull: No acute fracture. Sinuses/Orbits: Clear sinuses.  No acute orbital findings. Other: No mastoid effusions. IMPRESSION:  No evidence of acute intracranial abnormality. Remote right thalamic and caudate infarcts. Cerebral atrophy (ICD10-G31.9). Electronically Signed   By: Feliberto Harts M.D.   On: 11/03/2022 11:58    Microbiology: Results for orders placed or performed during the hospital encounter of 10/13/21  Urine Culture     Status: None   Collection Time: 10/13/21  6:07 PM   Specimen: Urine, Clean Catch  Result Value Ref Range Status   Specimen Description   Final    URINE, CLEAN CATCH Performed at [facility], [address] [place], [place] [address]    Special Requests   Final    NONE Performed at [facility], [address], [place], [place] [address]    Culture   Final    NO GROWTH Performed at [facility], [address], [place], [place] [address]    Report Status 10/15/2021 FINAL  Final  Resp Panel by RT-PCR (Flu A&B, Covid)     Status: None   Collection Time: 10/13/21  6:07 PM   Specimen: Nasopharyngeal(NP) swabs in vial transport medium  Result Value Ref Range Status   SARS Coronavirus 2 by RT PCR NEGATIVE NEGATIVE Final    Comment: (NOTE) SARS-CoV-2 target nucleic acids are NOT DETECTED.  The SARS-CoV-2 RNA is generally detectable in upper respiratory specimens during the acute phase of infection. The lowest concentration of SARS-CoV-2 viral copies this assay can detect is 138 copies/mL. A negative result does not preclude SARS-Cov-2 infection and should not be used as the sole basis for treatment or other patient management decisions. A negative result may occur with  improper specimen collection/handling, submission of specimen other than nasopharyngeal swab, presence of viral mutation(s) within the areas targeted by this assay, and inadequate number of viral copies(<138 copies/mL). A negative result must be combined with clinical observations, patient history, and epidemiological information. The expected result is  Negative.  Fact Sheet for Patients:  BloggerCourse.com  Fact Sheet for  Healthcare Providers:  SeriousBroker.it  This test is no t yet approved or cleared by the [place] and  has been authorized for detection and/or diagnosis of SARS-CoV-2 by FDA under an Emergency Use Authorization (EUA). This EUA will remain  in effect (meaning this test can be used) for the duration of the COVID-19 declaration under Section 564(b)(1) of the Act, 21 U.S.C.section 360bbb-3(b)(1), unless the authorization is terminated  or revoked sooner.       Influenza A by PCR NEGATIVE NEGATIVE Final   Influenza B by PCR NEGATIVE NEGATIVE Final    Comment: (NOTE) The Xpert Xpress SARS-CoV-2/FLU/RSV plus assay is intended as an aid in the diagnosis of influenza from Nasopharyngeal swab specimens and should not be used as a sole basis for treatment. Nasal washings and aspirates are unacceptable for Xpert Xpress SARS-CoV-2/FLU/RSV testing.  Fact Sheet for Patients: [employer]  Fact Sheet for Healthcare Providers: SeriousBroker.it  This test is not yet approved or cleared by the [place] FDA and has been authorized for detection and/or diagnosis of SARS-CoV-2 by FDA under an Emergency Use Authorization (EUA). This EUA will remain in effect (meaning this test can be used) for the duration of the COVID-19 declaration under Section 564(b)(1) of the Act, 21 U.S.C. section 360bbb-3(b)(1), unless the authorization is terminated or revoked.  Performed at [facility], [address], [place], [place] [address]     Labs: CBC: Recent Labs  Lab 11/05/22 [phone] 11/06/22 0634 11/08/22 0910  WBC 9.5 8.6 7.5  HGB 8.7* 8.9* 9.1*  HCT 28.8* 29.4* 30.1*  MCV 76.2* 76.8* 76.6*  PLT 407* 405* 392   Basic Metabolic Panel: Recent Labs  Lab 11/05/22 0437 11/06/22 0634  11/08/22 0910  NA 137 134* 133*  K 3.8 3.8 3.6  CL 101 98 96*  CO2 28 27 25   GLUCOSE 104* 96 131*  BUN 22 17 14   CREATININE 1.14* 1.12* 0.98  CALCIUM 8.4* 8.7* 8.9  MG 2.1 2.2 2.0  PHOS 4.7* 4.3 4.0   Liver Function Tests: Recent Labs  Lab 11/05/22 0437 11/06/22 0634 11/08/22 0910  ALBUMIN 2.9* 3.1* 3.4*   CBG: No results for input(s): "GLUCAP" in the last 168 hours.  Discharge time spent: 43 MINUTES  Signed: Kathlen Mody, MD [facility] 11/11/2022

## 2022-11-12 DIAGNOSIS — R531 Weakness: Secondary | ICD-10-CM

## 2022-11-12 DIAGNOSIS — I639 Cerebral infarction, unspecified: Secondary | ICD-10-CM | POA: Diagnosis not present

## 2022-11-12 MED ORDER — OXYCODONE HCL 10 MG PO TABS: 10.0000 mg | ORAL_TABLET | Freq: Four times a day (QID) | ORAL | 0 refills | Status: AC

## 2022-11-12 MED ORDER — PANTOPRAZOLE SODIUM 40 MG PO TBEC: 40.0000 mg | DELAYED_RELEASE_TABLET | Freq: Two times a day (BID) | ORAL | 11 refills | Status: AC

## 2022-11-12 MED ORDER — OXYCODONE HCL 10 MG PO TABS: 10.0000 mg | ORAL_TABLET | Freq: Four times a day (QID) | ORAL | 0 refills | Status: DC

## 2022-11-12 NOTE — Progress Notes (Signed)
Writer called and left voicemail for patient's granddaughter Laurice Record at [phone] to inform of patient's discharge to [facility].

## 2022-11-12 NOTE — TOC Transition Note (Signed)
Transition of Care [facility]) - CM/SW Discharge Note   Patient Details  Name: Amy Mckay MRN: [id number] Date of Birth: 07-13-1934  Transition of Care [facility]) CM/SW Contact:  Baldemar Lenis, LCSW Phone Number: 11/12/2022, 10:11 AM   Clinical Narrative:   CSW received insurance authorization for patient to admit to [facility]. CSW updated granddaughter, she is in agreement. Transport arranged with [facility] for next available.  Nurse to call report to [phone], Room 705P.    Final next level of care: Skilled Nursing Facility Barriers to Discharge: Barriers Resolved   Patient Goals and CMS Choice CMS Medicare.gov Compare Post Acute Care list provided to:: Patient Represenative (must comment) Choice offered to / list presented to : Adult Children  Discharge Placement                Patient chooses bed at: [facility] Patient to be transferred to facility by: [facility] Name of family member notified: Megan Patient and family notified of of transfer: 11/12/22  Discharge Plan and Services Additional resources added to the After Visit Summary for       Post Acute Care Choice: Skilled Nursing Facility                               Social Determinants of Health (SDOH) Interventions SDOH Screenings   Food Insecurity: No Food Insecurity (11/03/2022)  Housing: Low Risk  (11/03/2022)  Transportation Needs: No Transportation Needs (11/03/2022)  Utilities: Not At Risk (11/03/2022)  Tobacco Use: High Risk (11/03/2022)     Readmission Risk Interventions     No data to display

## 2022-11-12 NOTE — Discharge Summary (Signed)
Physician Discharge Summary   Patient: Amy Mckay MRN: [id number] DOB: 12-11-1934  Admit date:     11/03/2022  Discharge date: 11/12/22  Discharge Physician: Kathlen Mody   PCP: Annita Brod, MD   Recommendations at discharge:  Please follow up with PCp in one week.  Please follow up with neurology as recommended.   Discharge Diagnoses: Principal Problem:   Stroke [facility])    Hospital Course:  88 y.o. female with medical history significant of dementia, PAD, COPD, HTN, GERD and rectal prolapse presented to ED with progressive right-sided weakness for about 2 days and found to have left basal ganglia infarct. MRI brain Small acute perforator infarct in the left basal ganglia/overlying posterior left frontal posterior left frontal white matter.  CT angio head and neck with no emergent LVO.  LE venous Doppler negative for DVT bilaterally.  TTE without significant finding.  A1c 6.3%.  LDL 134.  Patient was on aspirin and Plavix prior to this hospitalization.  Neurology recommended Brilinta and aspirin.    Hospital course complicated by hypotension that has resolved with IV fluid.  Therapy recommended SNF.  Medically stable for discharge.  Assessment and Plan:   Acute left basal ganglia stroke: Presents with progressive right-sided weakness for 2 days. MRI brain Small acute perforator infarct in the left basal ganglia/overlying posterior left frontal posterior left frontal white matter.  CT angio head and neck without LVO.  TTE without significant finding.  UDS negative.  LDL 134.  A1c 6.3%.  Still with subtle RLE weakness.  Patient was on aspirin and Plavix before admission.  Per family, compliant with meds. -Continue aspirin and Brilinta per neurology -Continue statin. -PT/OT recommended SNF.   Hypotension: Episode of hypotension on 5/22.  Not on antihypertensive meds.  Hypokalemia resolved with IV fluid.  TTE reassuring. -Encourage oral hydration.   Chronic pain syndrome: PDMP  reviewed.  Fills 10 mg oxy, #120 and gabapentin 800 mg tablet #90 every month.  -Continue home oxycodone and gabapentin. -Bowel regimen as needed.   Microcytic iron deficiency anemia due to chronic blood loss.  H&H relatively stable. Recheck cbc in one week.    Hypothyroidism -Continue Synthroid   Leukocytosis: Likely demargination.  Resolved.   Rectal prolapse/constipation: Per granddaughter, told to be not a candidate for surgical intervention previously -Bowel regimen   Dementia without behavior disturbance: Stable. -Reorientation and delirium    PAD, chronic stable -Aspirin, Brilinta and statin as above -Continue home memantine   GERD: Reports reflux. -Continue Protonix  -GI cocktail as needed   COPD, chronic stable -Nebs as needed       Consultants: neurology.  Procedures performed: TTE  Disposition: Skilled nursing facility Diet recommendation:  Cardiac diet DISCHARGE MEDICATION: Allergies as of 11/12/2022       Reactions   Penicillins Anaphylaxis, Swelling, Other (See Comments)   Penicillin G Hives        Medication List     STOP taking these medications    clopidogrel 75 MG tablet Commonly known as: PLAVIX   esomeprazole 40 MG capsule Commonly known as: NEXIUM   hydrocortisone 2.5 % rectal cream Commonly known as: ANUSOL-HC       TAKE these medications    albuterol 108 (90 Base) MCG/ACT inhaler Commonly known as: VENTOLIN HFA Inhale 1-2 puffs into the lungs every 6 (six) hours as needed for wheezing or shortness of breath.   ascorbic acid 500 MG tablet Commonly known as: VITAMIN C Take 500 mg by mouth daily.   aspirin  EC 81 MG tablet Take 1 tablet (81 mg total) by mouth daily. Hold for 1 week. May resume 10/26/21 if remains stable with worsening anemia/GI bleeds   atorvastatin 40 MG tablet Commonly known as: LIPITOR Take 1 tablet (40 mg total) by mouth daily. What changed:  medication strength how much to take   Breo Ellipta  200-25 MCG/ACT Aepb Generic drug: fluticasone furoate-vilanterol Inhale 1 puff into the lungs daily.   CALCIUM + VITAMIN D3 PO Take 1 tablet by mouth daily.   gabapentin 800 MG tablet Commonly known as: NEURONTIN Take 800 mg by mouth 3 (three) times daily.   hydrocortisone 25 MG suppository Commonly known as: ANUSOL-HC Place 1 suppository (25 mg total) rectally 2 (two) times daily.   levothyroxine 75 MCG tablet Commonly known as: SYNTHROID Take 75 mcg by mouth daily before breakfast.   memantine 10 MG tablet Commonly known as: NAMENDA Take 10 mg by mouth 2 (two) times daily.   multivitamin with minerals Tabs tablet Take 1 tablet by mouth daily with breakfast.   Oxycodone HCl 10 MG Tabs Take 1 tablet (10 mg total) by mouth 4 (four) times daily for 3 days.   pantoprazole 40 MG tablet Commonly known as: Protonix Take 1 tablet (40 mg total) by mouth 2 (two) times daily.   polyethylene glycol 17 g packet Commonly known as: MIRALAX / GLYCOLAX Take 17 g by mouth 2 (two) times daily as needed for mild constipation.   pramipexole 0.25 MG tablet Commonly known as: MIRAPEX Take 0.25 mg by mouth 3 (three) times daily.   senna-docusate 8.6-50 MG tablet Commonly known as: Senokot-S Take 1 tablet by mouth 2 (two) times daily as needed for moderate constipation.   ticagrelor 90 MG Tabs tablet Commonly known as: BRILINTA Take 1 tablet (90 mg total) by mouth 2 (two) times daily.        Follow-up Information     [facility]. Schedule an appointment as soon as possible for a visit in 4 week(s).   Specialty: Neurology Contact information: [address] [place] [address] [phone]               Discharge Exam: Ceasar Mons Weights   11/03/22 1058 11/03/22 2032  Weight: 55 kg 55.2 kg   General exam: Appears calm and comfortable  Respiratory system: Clear to auscultation. Respiratory effort  normal. Cardiovascular system: S1 & S2 heard, RRR.  Gastrointestinal system: Abdomen is nondistended, soft and nontender.  Central nervous system: Alert and oriented. No focal neurological deficits. Extremities: Symmetric 5 x 5 power. Skin: No rashes,  Psychiatry:  Mood & affect appropriate.    Condition at discharge: fair  The results of significant diagnostics from this hospitalization (including imaging, microbiology, ancillary and laboratory) are listed below for reference.   Imaging Studies: ECHOCARDIOGRAM COMPLETE  Result Date: 11/04/2022    ECHOCARDIOGRAM REPORT   Patient Name:   HIEU KRISH Date of Exam: 11/04/2022 Medical Rec #:  [id number]     Height:       61.0 in Accession #:    [id number]    Weight:       121.7 lb Date of Birth:  15-Sep-1934     BSA:          1.529 m Patient Age:    88 years      BP:           98/76 mmHg Patient Gender: F  HR:           93 bpm. Exam Location:  Inpatient Procedure: 2D Echo, Cardiac Doppler and Color Doppler Indications:    Stroke I63.9  History:        Patient has no prior history of Echocardiogram examinations.                 Stroke and COPD; Risk Factors:Hypertension and Current Smoker.  Sonographer:    Lucendia Herrlich Referring Phys: [id number] AVWUJWJXBJY J PATEL IMPRESSIONS  1. Left ventricular ejection fraction, by estimation, is 60 to 65%. The left ventricle has normal function. The left ventricle has no regional wall motion abnormalities. Left ventricular diastolic parameters are consistent with Grade I diastolic dysfunction (impaired relaxation).  2. Right ventricular systolic function is normal. The right ventricular size is normal.  3. The mitral valve is normal in structure. No evidence of mitral valve regurgitation. No evidence of mitral stenosis. Moderate mitral annular calcification.  4. The aortic valve is normal in structure. Aortic valve regurgitation is not visualized. No aortic stenosis is present.  5. The inferior vena cava  is normal in size with greater than 50% respiratory variability, suggesting right atrial pressure of 3 mmHg. Conclusion(s)/Recommendation(s): No intracardiac source of embolism detected on this transthoracic study. Consider a transesophageal echocardiogram to exclude cardiac source of embolism if clinically indicated. FINDINGS  Left Ventricle: Left ventricular ejection fraction, by estimation, is 60 to 65%. The left ventricle has normal function. The left ventricle has no regional wall motion abnormalities. The left ventricular internal cavity size was normal in size. There is  no left ventricular hypertrophy. Left ventricular diastolic parameters are consistent with Grade I diastolic dysfunction (impaired relaxation). Right Ventricle: The right ventricular size is normal. No increase in right ventricular wall thickness. Right ventricular systolic function is normal. Left Atrium: Left atrial size was normal in size. Right Atrium: Right atrial size was normal in size. Pericardium: There is no evidence of pericardial effusion. Mitral Valve: The mitral valve is normal in structure. Moderate mitral annular calcification. No evidence of mitral valve regurgitation. No evidence of mitral valve stenosis. Tricuspid Valve: The tricuspid valve is normal in structure. Tricuspid valve regurgitation is not demonstrated. No evidence of tricuspid stenosis. Aortic Valve: The aortic valve is normal in structure. Aortic valve regurgitation is not visualized. No aortic stenosis is present. Aortic valve peak gradient measures 6.2 mmHg. Pulmonic Valve: The pulmonic valve was normal in structure. Pulmonic valve regurgitation is not visualized. No evidence of pulmonic stenosis. Aorta: The aortic root is normal in size and structure. Venous: The inferior vena cava is normal in size with greater than 50% respiratory variability, suggesting right atrial pressure of 3 mmHg. IAS/Shunts: No atrial level shunt detected by color flow Doppler.   LEFT VENTRICLE PLAX 2D LVIDd:         3.00 cm   Diastology LVIDs:         2.15 cm   LV e' medial:    5.55 cm/s LV PW:         1.00 cm   LV E/e' medial:  13.7 LV IVS:        0.90 cm   LV e' lateral:   9.36 cm/s LVOT diam:     1.70 cm   LV E/e' lateral: 8.1 LV SV:         39 LV SV Index:   25 LVOT Area:     2.27 cm  RIGHT VENTRICLE  IVC RV S prime:     12.50 cm/s  IVC diam: 1.50 cm TAPSE (M-mode): 2.0 cm LEFT ATRIUM             Index        RIGHT ATRIUM          Index LA diam:        2.30 cm 1.50 cm/m   RA Area:     9.31 cm LA Vol (A2C):   36.5 ml 23.87 ml/m  RA Volume:   15.60 ml 10.20 ml/m LA Vol (A4C):   22.5 ml 14.71 ml/m LA Biplane Vol: 29.2 ml 19.09 ml/m  AORTIC VALVE AV Area (Vmax): 1.72 cm AV Vmax:        124.00 cm/s AV Peak Grad:   6.2 mmHg LVOT Vmax:      93.70 cm/s LVOT Vmean:     64.800 cm/s LVOT VTI:       0.170 m  AORTA Ao Root diam: 3.40 cm Ao Asc diam:  3.40 cm MITRAL VALVE MV Area (PHT): 4.31 cm     SHUNTS MV Decel Time: 176 msec     Systemic VTI:  0.17 m MV E velocity: 75.90 cm/s   Systemic Diam: 1.70 cm MV A velocity: 138.00 cm/s MV E/A ratio:  0.55 Donato Schultz MD Electronically signed by Donato Schultz MD Signature Date/Time: 11/04/2022/1:25:03 PM    Final    VAS [place] LOWER EXTREMITY VENOUS (DVT) (ONLY [facility] & [facility])  Result Date: 11/03/2022  Lower Venous DVT Study Patient Name:  SHALESHA DRUMMER  Date of Exam:   11/03/2022 Medical Rec #: [id number]      Accession #:    [id number] Date of Birth: [birth year]      Patient Gender: F Patient Age:   17 years Exam Location:  [facility] Procedure:      VAS [place] LOWER EXTREMITY VENOUS (DVT) Referring Phys: Ernie Avena --------------------------------------------------------------------------------  Indications: Pain.  Risk Factors: None identified. Comparison Study: No prior studies. Performing Technologist: Chanda Busing RVT  Examination Guidelines: A complete evaluation includes B-mode imaging, spectral Doppler, color Doppler, and power  Doppler as needed of all accessible portions of each vessel. Bilateral testing is considered an integral part of a complete examination. Limited examinations for reoccurring indications may be performed as noted. The reflux portion of the exam is performed with the patient in reverse Trendelenburg.  +-----+---------------+---------+-----------+----------+--------------+ RIGHTCompressibilityPhasicitySpontaneityPropertiesThrombus Aging +-----+---------------+---------+-----------+----------+--------------+ CFV  Full           Yes      Yes                                 +-----+---------------+---------+-----------+----------+--------------+   +---------+---------------+---------+-----------+----------+--------------+ LEFT     CompressibilityPhasicitySpontaneityPropertiesThrombus Aging +---------+---------------+---------+-----------+----------+--------------+ CFV      Full           Yes      Yes                                 +---------+---------------+---------+-----------+----------+--------------+ SFJ      Full                                                        +---------+---------------+---------+-----------+----------+--------------+ FV Prox  Full                                                        +---------+---------------+---------+-----------+----------+--------------+ FV Mid   Full                                                        +---------+---------------+---------+-----------+----------+--------------+ FV DistalFull                                                        +---------+---------------+---------+-----------+----------+--------------+ PFV      Full                                                        +---------+---------------+---------+-----------+----------+--------------+ POP      Full           Yes      Yes                                  +---------+---------------+---------+-----------+----------+--------------+ PTV      Full                                                        +---------+---------------+---------+-----------+----------+--------------+ PERO     Full                                                        +---------+---------------+---------+-----------+----------+--------------+    Summary: RIGHT: - No evidence of common femoral vein obstruction.  LEFT: - There is no evidence of deep vein thrombosis in the lower extremity.  - No cystic structure found in the popliteal fossa.  *See table(s) above for measurements and observations. Electronically signed by Coral Else MD on 11/03/2022 at 7:16:16 PM.    Final    CT ANGIO HEAD NECK W WO CM  Result Date: 11/03/2022 CLINICAL DATA:  Stroke/TIA, determine embolic source EXAM: CT ANGIOGRAPHY HEAD AND NECK WITH AND WITHOUT CONTRAST TECHNIQUE: Multidetector CT imaging of the head and neck was performed using the standard protocol during bolus administration of intravenous contrast. Multiplanar CT image reconstructions and MIPs were obtained to evaluate the vascular anatomy. Carotid stenosis measurements (when applicable) are obtained utilizing NASCET criteria, using the distal internal carotid diameter as the denominator. RADIATION DOSE REDUCTION: This exam was performed according to the departmental dose-optimization program which includes automated exposure control, adjustment of the mA and/or kV according to patient size and/or use of iterative reconstruction  technique. CONTRAST:  OMNIPAQUE IOHEXOL 350 MG/ML SOLN COMPARISON:  MRI head from today. FINDINGS: CTA NECK FINDINGS Aortic arch: Visualized great vessel origins are patent. Aortic atherosclerosis. Right carotid system: Atherosclerosis at the carotid bifurcation without greater than 50% stenosis. Left carotid system: Atherosclerosis at the carotid bifurcation with approximately 45-50% stenosis. Vertebral  arteries: Multifocal moderate stenosis of the right vertebral artery. Multifocal irregularity and mild narrowing of the left vertebral artery, likely due to atherosclerosis. Skeleton: Negative. Other neck: Negative. Upper chest: Emphysema.  Calcified granuloma in the left upper lobe. Review of the MIP images confirms the above findings CTA HEAD FINDINGS Anterior circulation: Bilateral intracranial ICAs, MCAs and ACAs are patent without proximal hemodynamically significant stenosis. Posterior circulation: Bilateral intradural vertebral arteries, basilar artery and posterior cerebral arteries are patent. Multifocal mild stenosis of the intradural vertebral arteries and moderate stenosis of the proximal basilar artery. Moderate right posterior cerebral artery stenosis. Venous sinuses: As permitted by contrast timing, patent. Review of the MIP images confirms the above findings IMPRESSION: CTA head: 1. No emergent large vessel occlusion. 2. 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 3. Moderate stenosis of the right PCA. CTA neck: 1. Approximately 45-50% stenosis of the left ICA origin. 2. Multifocal moderate right and mild left vertebral artery stenosis/irregularity. 3. Aortic Atherosclerosis (ICD10-I70.0) and Emphysema (ICD10-J43.9). Electronically Signed   By: Feliberto Harts M.D.   On: 11/03/2022 16:07   CT ABDOMEN PELVIS W CONTRAST  Result Date: 11/03/2022 CLINICAL DATA:  Abdominal pain, right-sided numbness and weakness since yesterday EXAM: CT ABDOMEN AND PELVIS WITH CONTRAST TECHNIQUE: Multidetector CT imaging of the abdomen and pelvis was performed using the standard protocol following bolus administration of intravenous contrast. Because of scanner malfunction, imaging was performed 3 minutes after contrast injection. RADIATION DOSE REDUCTION: This exam was performed according to the departmental dose-optimization program which includes automated exposure control, adjustment of the mA and/or kV  according to patient size and/or use of iterative reconstruction technique. CONTRAST:  OMNIPAQUE IOHEXOL 350 MG/ML SOLN COMPARISON:  09/10/2022 FINDINGS: Lower chest: No acute pleural or parenchymal lung disease. Hepatobiliary: The liver is unremarkable. Gallbladder is fairly decompressed, with nonspecific areas of borderline gallbladder wall thickening measuring up to 4-5 mm. No evidence of cholelithiasis. No biliary duct dilation. Pancreas: Unremarkable. No pancreatic ductal dilatation or surrounding inflammatory changes. Spleen: Normal in size without focal abnormality. Adrenals/Urinary Tract: Excreted contrast within the kidneys, ureters, and bladder related to delayed imaging due to scanner malfunction. No filling defects. Kidneys appear unremarkable. The adrenals and bladder are unremarkable. Stomach/Bowel: No bowel obstruction or ileus. Moderate retained stool throughout the colon. Scattered colonic diverticulosis with no evidence of diverticulitis. Pelvic floor laxity, with pronounced rectal prolapse. Small hiatal hernia. Vascular/Lymphatic: Aortic atherosclerosis. No enlarged abdominal or pelvic lymph nodes. Reproductive: Status post hysterectomy. No adnexal masses. Other: No free fluid or free intraperitoneal gas. No abdominal wall hernia. Musculoskeletal: No acute or destructive bony abnormalities. Reconstructed images demonstrate no additional findings. IMPRESSION: 1. Pelvic floor laxity, with pronounced rectal prolapse. 2. Nonspecific gallbladder wall thickening given decompressed state of the gallbladder. No evidence of cholelithiasis. If further evaluation is clinically indicated, right upper quadrant ultrasound could be considered. 3. Moderate retained stool throughout the colon consistent with constipation. No bowel obstruction or ileus. 4. Scattered colonic diverticulosis without diverticulitis. 5.  Aortic Atherosclerosis (ICD10-I70.0). Electronically Signed   By: Sharlet Salina M.D.   On:  11/03/2022 15:56   MR BRAIN WO CONTRAST  Result Date: 11/03/2022 EXAM: MRI HEAD WITHOUT  CONTRAST TECHNIQUE: Multiplanar, multiecho pulse sequences of the brain and surrounding structures were obtained without intravenous contrast. COMPARISON:  CT head from today. FINDINGS: Brain: Small acute perforator infarct in the left basal ganglia/overlying posterior left frontal white matter. Mild edema without mass effect. No evidence of acute hemorrhage, mass lesion, midline shift or hydrocephalus. Scattered T2/FLAIR hyperintensities within white matter, compatible with mild for age chronic microvascular ischemic change Vascular: Major arterial flow voids are maintained at the skull base. Skull and upper cervical spine: Normal marrow signal. Sinuses/Orbits: Clear sinuses.  No acute orbital findings. Other: No mastoid effusions. IMPRESSION: Small acute perforator infarct in the left basal ganglia/overlying posterior left frontal posterior left frontal white matter. Electronically Signed   By: Feliberto Harts M.D.   On: 11/03/2022 14:33   CT HEAD WO CONTRAST  Result Date: 11/03/2022 CLINICAL DATA:  Neuro deficit, acute, stroke suspected EXAM: CT HEAD WITHOUT CONTRAST TECHNIQUE: Contiguous axial images were obtained from the base of the skull through the vertex without intravenous contrast. RADIATION DOSE REDUCTION: This exam was performed according to the departmental dose-optimization program which includes automated exposure control, adjustment of the mA and/or kV according to patient size and/or use of iterative reconstruction technique. COMPARISON:  CT Head Oct 13, 2021. FINDINGS: Brain: No evidence of acute infarction, hemorrhage, hydrocephalus, extra-axial collection or mass lesion/mass effect. Cerebral atrophy. Remote right thalamic and caudate infarcts. Vascular: No hyperdense vessel. Skull: No acute fracture. Sinuses/Orbits: Clear sinuses.  No acute orbital findings. Other: No mastoid effusions. IMPRESSION:  No evidence of acute intracranial abnormality. Remote right thalamic and caudate infarcts. Cerebral atrophy (ICD10-G31.9). Electronically Signed   By: Feliberto Harts M.D.   On: 11/03/2022 11:58    Microbiology: Results for orders placed or performed during the hospital encounter of 10/13/21  Urine Culture     Status: None   Collection Time: 10/13/21  6:07 PM   Specimen: Urine, Clean Catch  Result Value Ref Range Status   Specimen Description   Final    URINE, CLEAN CATCH Performed at [facility], [address] [place], [place] [address]    Special Requests   Final    NONE Performed at [facility], [address], [place], [place] [address]    Culture   Final    NO GROWTH Performed at [facility], [address], [place], [place] [address]    Report Status 10/15/2021 FINAL  Final  Resp Panel by RT-PCR (Flu A&B, Covid)     Status: None   Collection Time: 10/13/21  6:07 PM   Specimen: Nasopharyngeal(NP) swabs in vial transport medium  Result Value Ref Range Status   SARS Coronavirus 2 by RT PCR NEGATIVE NEGATIVE Final    Comment: (NOTE) SARS-CoV-2 target nucleic acids are NOT DETECTED.  The SARS-CoV-2 RNA is generally detectable in upper respiratory specimens during the acute phase of infection. The lowest concentration of SARS-CoV-2 viral copies this assay can detect is 138 copies/mL. A negative result does not preclude SARS-Cov-2 infection and should not be used as the sole basis for treatment or other patient management decisions. A negative result may occur with  improper specimen collection/handling, submission of specimen other than nasopharyngeal swab, presence of viral mutation(s) within the areas targeted by this assay, and inadequate number of viral copies(<138 copies/mL). A negative result must be combined with clinical observations, patient history, and epidemiological information. The expected result is  Negative.  Fact Sheet for Patients:  BloggerCourse.com  Fact Sheet for  Healthcare Providers:  SeriousBroker.it  This test is no t yet approved or cleared by the [place] and  has been authorized for detection and/or diagnosis of SARS-CoV-2 by FDA under an Emergency Use Authorization (EUA). This EUA will remain  in effect (meaning this test can be used) for the duration of the COVID-19 declaration under Section 564(b)(1) of the Act, 21 U.S.C.section 360bbb-3(b)(1), unless the authorization is terminated  or revoked sooner.       Influenza A by PCR NEGATIVE NEGATIVE Final   Influenza B by PCR NEGATIVE NEGATIVE Final    Comment: (NOTE) The Xpert Xpress SARS-CoV-2/FLU/RSV plus assay is intended as an aid in the diagnosis of influenza from Nasopharyngeal swab specimens and should not be used as a sole basis for treatment. Nasal washings and aspirates are unacceptable for Xpert Xpress SARS-CoV-2/FLU/RSV testing.  Fact Sheet for Patients: BloggerCourse.com  Fact Sheet for Healthcare Providers: SeriousBroker.it  This test is not yet approved or cleared by the [place] FDA and has been authorized for detection and/or diagnosis of SARS-CoV-2 by FDA under an Emergency Use Authorization (EUA). This EUA will remain in effect (meaning this test can be used) for the duration of the COVID-19 declaration under Section 564(b)(1) of the Act, 21 U.S.C. section 360bbb-3(b)(1), unless the authorization is terminated or revoked.  Performed at [facility], [address], [place], [place] [address]     Labs: CBC: Recent Labs  Lab 11/06/22 0634 11/08/22 0910  WBC 8.6 7.5  HGB 8.9* 9.1*  HCT 29.4* 30.1*  MCV 76.8* 76.6*  PLT 405* 392    Basic Metabolic Panel: Recent Labs  Lab 11/06/22 0634 11/08/22 0910  NA 134* 133*  K 3.8 3.6  CL 98 96*  CO2 27  25  GLUCOSE 96 131*  BUN 17 14  CREATININE 1.12* 0.98  CALCIUM 8.7* 8.9  MG 2.2 2.0  PHOS 4.3 4.0    Liver Function Tests: Recent Labs  Lab 11/06/22 0634 11/08/22 0910  ALBUMIN 3.1* 3.4*    CBG: No results for input(s): "GLUCAP" in the last 168 hours.  Discharge time spent: 68 MINUTES  Signed: Kathlen Mody, MD [facility] 11/12/2022

## 2022-11-12 NOTE — Progress Notes (Signed)
Patient discharged to [facility], per order. VSS. RA. Respirations are even and unlabored. NAD. Patient denies CP or SOB. PIV removed, tip intact. All personal belongings sent with patient. AVS printed and sent with MD ordered prescription to receiving facility. Report called by Joni Reining, RN Multimedia programmer).

## 2023-01-28 ENCOUNTER — Other Ambulatory Visit: Payer: Self-pay

## 2023-01-28 ENCOUNTER — Inpatient Hospital Stay (HOSPITAL_COMMUNITY)
Admission: EM | Admit: 2023-01-28 | Discharge: 2023-01-30 | DRG: 394 | Disposition: A | Discharge status: Skilled Nursing Facility | Payer: 59 | Source: Skilled Nursing Facility | Attending: Family Medicine | Admitting: Family Medicine

## 2023-01-28 ENCOUNTER — Encounter (HOSPITAL_COMMUNITY): Payer: Self-pay | Admitting: Internal Medicine

## 2023-01-28 ENCOUNTER — Emergency Department (HOSPITAL_COMMUNITY): Payer: 59

## 2023-01-28 DIAGNOSIS — G8929 Other chronic pain: Secondary | ICD-10-CM | POA: Diagnosis present

## 2023-01-28 DIAGNOSIS — F17210 Nicotine dependence, cigarettes, uncomplicated: Secondary | ICD-10-CM | POA: Diagnosis present

## 2023-01-28 DIAGNOSIS — I700 Atherosclerosis of aorta: Secondary | ICD-10-CM | POA: Diagnosis present

## 2023-01-28 DIAGNOSIS — J449 Chronic obstructive pulmonary disease, unspecified: Secondary | ICD-10-CM | POA: Diagnosis present

## 2023-01-28 DIAGNOSIS — M545 Low back pain, unspecified: Secondary | ICD-10-CM | POA: Diagnosis present

## 2023-01-28 DIAGNOSIS — Z9981 Dependence on supplemental oxygen: Secondary | ICD-10-CM

## 2023-01-28 DIAGNOSIS — K6289 Other specified diseases of anus and rectum: Secondary | ICD-10-CM | POA: Diagnosis present

## 2023-01-28 DIAGNOSIS — Z79890 Hormone replacement therapy (postmenopausal): Secondary | ICD-10-CM

## 2023-01-28 DIAGNOSIS — I739 Peripheral vascular disease, unspecified: Secondary | ICD-10-CM | POA: Diagnosis present

## 2023-01-28 DIAGNOSIS — Z79899 Other long term (current) drug therapy: Secondary | ICD-10-CM

## 2023-01-28 DIAGNOSIS — E039 Hypothyroidism, unspecified: Secondary | ICD-10-CM | POA: Diagnosis present

## 2023-01-28 DIAGNOSIS — J9611 Chronic respiratory failure with hypoxia: Secondary | ICD-10-CM | POA: Diagnosis present

## 2023-01-28 DIAGNOSIS — F0390 Unspecified dementia without behavioral disturbance: Secondary | ICD-10-CM | POA: Diagnosis present

## 2023-01-28 DIAGNOSIS — G2581 Restless legs syndrome: Secondary | ICD-10-CM | POA: Diagnosis present

## 2023-01-28 DIAGNOSIS — K219 Gastro-esophageal reflux disease without esophagitis: Secondary | ICD-10-CM | POA: Diagnosis present

## 2023-01-28 DIAGNOSIS — Z7982 Long term (current) use of aspirin: Secondary | ICD-10-CM

## 2023-01-28 DIAGNOSIS — R1319 Other dysphagia: Secondary | ICD-10-CM | POA: Diagnosis present

## 2023-01-28 DIAGNOSIS — E871 Hypo-osmolality and hyponatremia: Secondary | ICD-10-CM | POA: Diagnosis present

## 2023-01-28 DIAGNOSIS — D500 Iron deficiency anemia secondary to blood loss (chronic): Secondary | ICD-10-CM | POA: Diagnosis present

## 2023-01-28 DIAGNOSIS — R413 Other amnesia: Secondary | ICD-10-CM | POA: Diagnosis present

## 2023-01-28 DIAGNOSIS — K224 Dyskinesia of esophagus: Secondary | ICD-10-CM | POA: Diagnosis present

## 2023-01-28 DIAGNOSIS — Z8673 Personal history of transient ischemic attack (TIA), and cerebral infarction without residual deficits: Secondary | ICD-10-CM

## 2023-01-28 DIAGNOSIS — K623 Rectal prolapse: Secondary | ICD-10-CM | POA: Diagnosis not present

## 2023-01-28 DIAGNOSIS — I10 Essential (primary) hypertension: Secondary | ICD-10-CM | POA: Diagnosis present

## 2023-01-28 DIAGNOSIS — G894 Chronic pain syndrome: Secondary | ICD-10-CM | POA: Diagnosis present

## 2023-01-28 DIAGNOSIS — R1314 Dysphagia, pharyngoesophageal phase: Secondary | ICD-10-CM | POA: Diagnosis present

## 2023-01-28 DIAGNOSIS — Z880 Allergy status to penicillin: Secondary | ICD-10-CM

## 2023-01-28 DIAGNOSIS — Z7902 Long term (current) use of antithrombotics/antiplatelets: Secondary | ICD-10-CM

## 2023-01-28 LAB — CBC WITH DIFFERENTIAL/PLATELET
Abs Immature Granulocytes: 0.09 10*3/uL — ABNORMAL HIGH (ref 0.00–0.07)
Basophils Absolute: 0 10*3/uL (ref 0.0–0.1)
Basophils Relative: 0 %
Eosinophils Absolute: 0.5 10*3/uL (ref 0.0–0.5)
Eosinophils Relative: 5 %
HCT: 34.1 % — ABNORMAL LOW (ref 36.0–46.0)
Hemoglobin: 10.6 g/dL — ABNORMAL LOW (ref 12.0–15.0)
Immature Granulocytes: 1 %
Lymphocytes Relative: 14 %
Lymphs Abs: 1.3 10*3/uL (ref 0.7–4.0)
MCH: 26.8 pg (ref 26.0–34.0)
MCHC: 31.1 g/dL (ref 30.0–36.0)
MCV: 86.3 fL (ref 80.0–100.0)
Monocytes Absolute: 1.3 10*3/uL — ABNORMAL HIGH (ref 0.1–1.0)
Monocytes Relative: 13 %
Neutro Abs: 6.4 10*3/uL (ref 1.7–7.7)
Neutrophils Relative %: 67 %
Platelets: 410 10*3/uL — ABNORMAL HIGH (ref 150–400)
RBC: 3.95 MIL/uL (ref 3.87–5.11)
RDW: 17.5 % — ABNORMAL HIGH (ref 11.5–15.5)
WBC: 9.6 10*3/uL (ref 4.0–10.5)
nRBC: 0 % (ref 0.0–0.2)

## 2023-01-28 LAB — COMPREHENSIVE METABOLIC PANEL
ALT: 15 U/L (ref 0–44)
AST: 18 U/L (ref 15–41)
Albumin: 3.3 g/dL — ABNORMAL LOW (ref 3.5–5.0)
Alkaline Phosphatase: 87 U/L (ref 38–126)
Anion gap: 12 (ref 5–15)
BUN: 19 mg/dL (ref 8–23)
CO2: 26 mmol/L (ref 22–32)
Calcium: 8.8 mg/dL — ABNORMAL LOW (ref 8.9–10.3)
Chloride: 90 mmol/L — ABNORMAL LOW (ref 98–111)
Creatinine, Ser: 0.9 mg/dL (ref 0.44–1.00)
GFR, Estimated: >60 mL/min (ref >60)
Glucose, Bld: 93 mg/dL (ref 70–99)
Potassium: 4.6 mmol/L (ref 3.5–5.1)
Sodium: 128 mmol/L — ABNORMAL LOW (ref 135–145)
Total Bilirubin: 0.2 mg/dL — ABNORMAL LOW (ref 0.3–1.2)
Total Protein: 7.2 g/dL (ref 6.5–8.1)

## 2023-01-28 LAB — LIPASE, BLOOD: Lipase: 30 U/L (ref 11–51)

## 2023-01-28 LAB — URINALYSIS, ROUTINE W REFLEX MICROSCOPIC
Bilirubin Urine: NEGATIVE
Glucose, UA: NEGATIVE mg/dL
Hgb urine dipstick: NEGATIVE
Ketones, ur: NEGATIVE mg/dL
Leukocytes,Ua: NEGATIVE
Nitrite: NEGATIVE
Protein, ur: NEGATIVE mg/dL
Specific Gravity, Urine: 1.005 (ref 1.005–1.030)
pH: 6 (ref 5.0–8.0)

## 2023-01-28 LAB — MRSA NEXT GEN BY PCR, NASAL: MRSA by PCR Next Gen: NOT DETECTED

## 2023-01-28 MED ORDER — OXYCODONE HCL 5 MG PO TABS
10.0000 mg | ORAL_TABLET | Freq: Four times a day (QID) | ORAL | Status: DC | PRN
  Administered 2023-01-28 – 2023-01-30 (×3): 10 mg via ORAL
  Filled 2023-01-28 (×3): qty 2

## 2023-01-28 MED ORDER — ASPIRIN 81 MG PO TBEC
81.0000 mg | DELAYED_RELEASE_TABLET | Freq: Every day | ORAL | Status: DC
  Administered 2023-01-30: 81 mg via ORAL
  Filled 2023-01-28: qty 1

## 2023-01-28 MED ORDER — ATORVASTATIN CALCIUM 40 MG PO TABS
40.0000 mg | ORAL_TABLET | Freq: Every day | ORAL | Status: DC
  Administered 2023-01-29 – 2023-01-30 (×2): 40 mg via ORAL
  Filled 2023-01-28 (×2): qty 1

## 2023-01-28 MED ORDER — GABAPENTIN 400 MG PO CAPS
800.0000 mg | ORAL_CAPSULE | Freq: Three times a day (TID) | ORAL | Status: DC
  Administered 2023-01-29 – 2023-01-30 (×4): 800 mg via ORAL
  Filled 2023-01-28 (×5): qty 2

## 2023-01-28 MED ORDER — MORPHINE SULFATE (PF) 2 MG/ML IV SOLN
2.0000 mg | Freq: Once | INTRAVENOUS | Status: AC
  Administered 2023-01-28: 2 mg via INTRAVENOUS
  Filled 2023-01-28: qty 1

## 2023-01-28 MED ORDER — IOHEXOL 300 MG/ML  SOLN
100.0000 mL | Freq: Once | INTRAMUSCULAR | Status: AC | PRN
  Administered 2023-01-28: 100 mL via INTRAVENOUS

## 2023-01-28 MED ORDER — ALBUTEROL SULFATE (2.5 MG/3ML) 0.083% IN NEBU: 2.5000 mg | INHALATION_SOLUTION | Freq: Four times a day (QID) | RESPIRATORY_TRACT | Status: DC | PRN

## 2023-01-28 MED ORDER — FLUTICASONE FUROATE-VILANTEROL 200-25 MCG/ACT IN AEPB
1.0000 | INHALATION_SPRAY | Freq: Every day | RESPIRATORY_TRACT | Status: DC
  Administered 2023-01-29 – 2023-01-30 (×2): 1 via RESPIRATORY_TRACT
  Filled 2023-01-28: qty 28

## 2023-01-28 MED ORDER — LEVOTHYROXINE SODIUM 75 MCG PO TABS
75.0000 ug | ORAL_TABLET | Freq: Every day | ORAL | Status: DC
  Administered 2023-01-30: 75 ug via ORAL
  Filled 2023-01-28: qty 1

## 2023-01-28 MED ORDER — ACETAMINOPHEN 325 MG PO TABS
650.0000 mg | ORAL_TABLET | Freq: Once | ORAL | Status: AC
  Administered 2023-01-28: 650 mg via ORAL
  Filled 2023-01-28: qty 2

## 2023-01-28 MED ORDER — TICAGRELOR 90 MG PO TABS: 90.0000 mg | ORAL_TABLET | Freq: Two times a day (BID) | ORAL | Status: DC

## 2023-01-28 MED ORDER — LACTATED RINGERS IV BOLUS
500.0000 mL | Freq: Once | INTRAVENOUS | Status: AC
  Administered 2023-01-28: 500 mL via INTRAVENOUS

## 2023-01-28 MED ORDER — ACETAMINOPHEN 650 MG RE SUPP: 650.0000 mg | Freq: Four times a day (QID) | RECTAL | Status: DC | PRN

## 2023-01-28 MED ORDER — ACETAMINOPHEN 325 MG PO TABS
650.0000 mg | ORAL_TABLET | Freq: Four times a day (QID) | ORAL | Status: DC | PRN
  Administered 2023-01-30: 650 mg via ORAL
  Filled 2023-01-28: qty 2

## 2023-01-28 MED ORDER — OXYCODONE HCL 5 MG PO TABS
5.0000 mg | ORAL_TABLET | Freq: Once | ORAL | Status: AC
  Administered 2023-01-28: 5 mg via ORAL
  Filled 2023-01-28: qty 1

## 2023-01-28 MED ORDER — MEMANTINE HCL 10 MG PO TABS
10.0000 mg | ORAL_TABLET | Freq: Two times a day (BID) | ORAL | Status: DC
  Administered 2023-01-29 – 2023-01-30 (×2): 10 mg via ORAL
  Filled 2023-01-28: qty 1
  Filled 2023-01-28: qty 2
  Filled 2023-01-28: qty 1

## 2023-01-28 MED ORDER — ALBUTEROL SULFATE HFA 108 (90 BASE) MCG/ACT IN AERS: 1.0000 | INHALATION_SPRAY | Freq: Four times a day (QID) | RESPIRATORY_TRACT | Status: DC | PRN

## 2023-01-28 MED ORDER — PRAMIPEXOLE DIHYDROCHLORIDE 0.25 MG PO TABS
0.2500 mg | ORAL_TABLET | Freq: Three times a day (TID) | ORAL | Status: DC
  Administered 2023-01-29 – 2023-01-30 (×4): 0.25 mg via ORAL
  Filled 2023-01-28 (×7): qty 1

## 2023-01-28 MED ORDER — PANTOPRAZOLE SODIUM 40 MG PO TBEC
40.0000 mg | DELAYED_RELEASE_TABLET | Freq: Two times a day (BID) | ORAL | Status: DC
  Administered 2023-01-29 – 2023-01-30 (×2): 40 mg via ORAL
  Filled 2023-01-28 (×2): qty 1

## 2023-01-28 MED ORDER — SODIUM CHLORIDE 0.9 % IV SOLN: INTRAVENOUS | Status: AC

## 2023-01-28 MED ORDER — ONDANSETRON HCL 4 MG PO TABS: 4.0000 mg | ORAL_TABLET | Freq: Four times a day (QID) | ORAL | Status: DC | PRN

## 2023-01-28 MED ORDER — ONDANSETRON HCL 4 MG/2ML IJ SOLN: 4.0000 mg | Freq: Four times a day (QID) | INTRAMUSCULAR | Status: DC | PRN

## 2023-01-28 MED ORDER — FENTANYL CITRATE PF 50 MCG/ML IJ SOSY
50.0000 ug | PREFILLED_SYRINGE | Freq: Once | INTRAMUSCULAR | Status: AC
  Administered 2023-01-28: 50 ug via INTRAVENOUS
  Filled 2023-01-28: qty 1

## 2023-01-28 NOTE — ED Provider Notes (Signed)
4:51 PM Care assumed for Dr. Earlene Plater.  At time of transfer of care, patient is awaiting for results of CT scan.  Reportedly on exam, patient did not have rectal prolapse but was have some abdominal discomfort.    6:03 PM CT scan returned showing suspected proctitis with inflammation in the rectum.  I went to assess the patient and she is still having it and pain in her abdomen and rectal area.  She reports that her prolapse is still reduced but over the last 3 months it is primarily been out the entire time other than several episodes where it is gone back in as it is now.  She reports the pain is drove her blood pressure up and she is still having the discomfort.  Will give dose of pain medicine.  We had a shared decision-making conversation about to discharge back to her facility versus admission for this continued rectal and abdominal pain with his proctitis and she would prefer to be admitted.  Will call for admission for further management.   Spoke to [facility] with Dr. Ewing Schlein who agrees with admission to medicine and they will see in consultation in the morning with the day team.  Does not appear patient has had a flexible sigmoidoscopy since this new prolapse and pain troubles began.  Medicine will admit.   Clinical Impression: 1. Rectal pain   2. Proctitis     Disposition: Admit  This note was prepared with assistance of Dragon voice recognition software. Occasional wrong-word or sound-a-like substitutions may have occurred due to the inherent limitations of voice recognition software.     Eliana Lueth, Canary Brim, MD 01/28/23 2234

## 2023-01-28 NOTE — ED Provider Notes (Signed)
[place] [facility] AT [facility] Provider Note   CSN: [id number] Arrival date & time: 01/28/23  1138     History  Chief Complaint  Patient presents with   Hypotension    Amy Mckay is a 88 y.o. female.  HPI 88 year old female history of dementia, peripheral artery disease, COPD, hypertension, GERD, chronic rectal prolapse presenting for rectal pain and hypotension.  Patient presents from a nursing facility.  Per EMS report nurse noted she was hypotensive to the systolic of 90's but asymptomatic.  They sent her here for evaluation.  Patient also notes she has had chronic rectal pain and has a chronic rectal prolapse.  She is having bowel also has a hard time telling when she has abdominal.  She states she did not feel well earlier and had worsening rectal pain and worsened lower abdominal pain.  No chest pain, shortness of breath.  No nausea or vomiting.  No fevers or chills or shortness of breath.  She has been eating and drinking.  No vomiting or diarrhea.  She is otherwise been at her baseline health.  Unsure of any medication changes.     Home Medications Prior to Admission medications   Medication Sig Start Date End Date Taking? Authorizing Provider  albuterol (VENTOLIN HFA) 108 (90 Base) MCG/ACT inhaler Inhale 1-2 puffs into the lungs every 6 (six) hours as needed for wheezing or shortness of breath.    [provider]  aspirin EC 81 MG tablet Take 1 tablet (81 mg total) by mouth daily. Hold for 1 week. May resume 10/26/21 if remains stable with worsening anemia/GI bleeds 10/18/21   Alessandra Bevels, MD  atorvastatin (LIPITOR) 40 MG tablet Take 1 tablet (40 mg total) by mouth daily. 11/06/22   Almon Hercules, MD  BREO ELLIPTA 200-25 MCG/ACT AEPB Inhale 1 puff into the lungs daily. 11/03/22   [provider]  Calcium Carb-Cholecalciferol (CALCIUM + VITAMIN D3 PO) Take 1 tablet by mouth daily.    [provider]  gabapentin  (NEURONTIN) 800 MG tablet Take 800 mg by mouth 3 (three) times daily. 04/25/16   [provider]  hydrocortisone (ANUSOL-HC) 25 MG suppository Place 1 suppository (25 mg total) rectally 2 (two) times daily. Patient not taking: Reported on 11/03/2022 10/18/21   Alessandra Bevels, MD  levothyroxine (SYNTHROID, LEVOTHROID) 75 MCG tablet Take 75 mcg by mouth daily before breakfast. 02/05/16   [provider]  memantine (NAMENDA) 10 MG tablet Take 10 mg by mouth 2 (two) times daily.    [provider]  Multiple Vitamin (MULTIVITAMIN WITH MINERALS) TABS tablet Take 1 tablet by mouth daily with breakfast.    [provider]  pantoprazole (PROTONIX) 40 MG tablet Take 1 tablet (40 mg total) by mouth 2 (two) times daily. 11/12/22 11/12/23  Kathlen Mody, MD  polyethylene glycol (MIRALAX / GLYCOLAX) 17 g packet Take 17 g by mouth 2 (two) times daily as needed for mild constipation. 11/06/22   Almon Hercules, MD  pramipexole (MIRAPEX) 0.25 MG tablet Take 0.25 mg by mouth 3 (three) times daily. 01/02/21   [provider]  senna-docusate (SENOKOT-S) 8.6-50 MG tablet Take 1 tablet by mouth 2 (two) times daily as needed for moderate constipation. 11/06/22   Almon Hercules, MD  ticagrelor (BRILINTA) 90 MG TABS tablet Take 1 tablet (90 mg total) by mouth 2 (two) times daily. 11/06/22   Almon Hercules, MD  vitamin C (ASCORBIC ACID) 500 MG tablet Take 500  mg by mouth daily.    [provider]      Allergies    Penicillins and Penicillin g    Review of Systems   Review of Systems Review of systems completed and notable as per HPI.  ROS otherwise negative.   Physical Exam Updated Vital Signs BP (!) 156/86 (BP Location: Left Arm)   Pulse 80   Temp 98.1 F (36.7 C) (Oral)   Resp 10   Ht 5\' 1"  (1.549 m)   Wt 55 kg   SpO2 97%   BMI 22.91 kg/m  Physical Exam Vitals and nursing note reviewed.  Constitutional:      General: She is not in acute distress.     Appearance: She is well-developed.  HENT:     Head: Normocephalic and atraumatic.     Mouth/Throat:     Mouth: Mucous membranes are moist.     Pharynx: Oropharynx is clear.  Eyes:     Extraocular Movements: Extraocular movements intact.     Conjunctiva/sclera: Conjunctivae normal.     Pupils: Pupils are equal, round, and reactive to light.  Cardiovascular:     Rate and Rhythm: Normal rate and regular rhythm.     Heart sounds: No murmur heard. Pulmonary:     Effort: Pulmonary effort is normal. No respiratory distress.     Breath sounds: Normal breath sounds.  Abdominal:     Palpations: Abdomen is soft.     Tenderness: There is abdominal tenderness. There is no guarding or rebound.     Comments: Mild lower abdominal tenderness.  Genitourinary:    Comments: Rectum is not prolapsed.  Rectal exam mildly tender, no stool ball. Musculoskeletal:        General: No swelling.     Cervical back: Neck supple.  Skin:    General: Skin is warm and dry.     Capillary Refill: Capillary refill takes less than 2 seconds.  Neurological:     General: No focal deficit present.     Mental Status: She is alert and oriented to person, place, and time. Mental status is at baseline.     Cranial Nerves: No cranial nerve deficit.     Sensory: No sensory deficit.     Motor: No weakness.  Psychiatric:        Mood and Affect: Mood normal.     ED Results / Procedures / Treatments   Labs (all labs ordered are listed, but only abnormal results are displayed) Labs Reviewed  CBC WITH DIFFERENTIAL/PLATELET - Abnormal; Notable for the following components:      Result Value   Hemoglobin 10.6 (*)    HCT 34.1 (*)    RDW 17.5 (*)    Platelets 410 (*)    Monocytes Absolute 1.3 (*)    Abs Immature Granulocytes 0.09 (*)    All other components within normal limits  COMPREHENSIVE METABOLIC PANEL - Abnormal; Notable for the following components:   Sodium 128 (*)    Chloride 90 (*)    Calcium 8.8 (*)     Albumin 3.3 (*)    Total Bilirubin 0.2 (*)    All other components within normal limits  URINALYSIS, ROUTINE W REFLEX MICROSCOPIC - Abnormal; Notable for the following components:   Color, Urine STRAW (*)    All other components within normal limits  LIPASE, BLOOD    EKG EKG Interpretation Date/Time:  Thursday January 28 2023 14:05:19 EDT Ventricular Rate:  89 PR Interval:  186 QRS Duration:  90 QT Interval:  357 QTC Calculation: 435 R Axis:   78  Text Interpretation: Sinus rhythm Right atrial enlargement Probable anteroseptal infarct, old Confirmed by Fulton Reek [phone]) on 01/28/2023 3:03:07 PM  Radiology CT ABDOMEN PELVIS W CONTRAST  Result Date: 01/28/2023 CLINICAL DATA:  Lower abdominal pain.  Hypotension and rectal pain. EXAM: CT ABDOMEN AND PELVIS WITH CONTRAST TECHNIQUE: Multidetector CT imaging of the abdomen and pelvis was performed using the standard protocol following bolus administration of intravenous contrast. RADIATION DOSE REDUCTION: This exam was performed according to the departmental dose-optimization program which includes automated exposure control, adjustment of the mA and/or kV according to patient size and/or use of iterative reconstruction technique. CONTRAST:  OMNIPAQUE IOHEXOL 300 MG/ML  SOLN COMPARISON:  None Available. FINDINGS: Lower chest: 6 mm subpleural nodule in the periphery of the right lower lobe, image 10/4. unchanged compared with 01/19/2021. This is compatible with a benign nodule require no further follow-up. Subsegmental atelectasis versus scarring is identified within both lung bases, right greater than left. No pleural effusion identified. Hepatobiliary: No suspicious liver abnormality. Mild gallbladder wall thickening measures up to 4 mm, image 37/2. Similar to 5/20 1/24. No gallstones identified. Unchanged increased caliber of the common bile duct which measures up to 9 mm, image 40/5. This is similar in appearance when compared with MRI  from 10/22/2015. Cystic lesion within the uncinate process of the pancreas is stable measuring 9 mm when compared with the MR from 2017. Similar appearance of increased caliber of the main duct at the level of the head and neck of pancreas which measures up to 4 mm, image 36/2. Gallbladder wall enhancement and mild thickening measures Pancreas: Unremarkable. No pancreatic ductal dilatation or surrounding inflammatory changes. Spleen: Normal in size without focal abnormality. Adrenals/Urinary Tract: Normal adrenal glands. Bilateral renal cortical scarring. No nephrolithiasis, hydronephrosis, or suspicious mass. Moderate distension of the urinary bladder without focal abnormality Stomach/Bowel: Stomach appears normal. No pathologic dilatation of the large or small bowel loops. Sigmoid diverticulosis without signs of acute diverticulitis. Wall thickening and mucosal enhancement involving the rectum is identified. Again seen are signs of pelvic floor laxity with there is been interval reduction of previous rectal prolapse noted on the exam from 11/03/2022. Vascular/Lymphatic: Aortic atherosclerosis. No aneurysm. No sign of abdominopelvic adenopathy. Reproductive: Status post hysterectomy. No adnexal masses. Other: No free fluid or fluid collections. No signs of pneumoperitoneum Musculoskeletal: Curvature of the lumbar spine is convex towards the left. Multi level degenerative disc disease identified. No acute or suspicious osseous abnormality. IMPRESSION: 1. Wall thickening and mucosal enhancement involving the rectum is identified. Findings are suggestive of proctitis. 2. Again seen are signs of pelvic floor laxity with interval reduction of previous rectal prolapse noted on the exam from 11/03/2022. 3. Sigmoid diverticulosis without signs of acute diverticulitis. 4. Stable appearance of increased caliber of the common bile duct and main pancreatic duct when compared with MRI from 10/22/2015. 5. Stable cystic lesion  within the uncinate process of the pancreas measuring 9 mm when compared with the MR from 2017. 6. 6 mm subpleural nodule in the periphery of the right lower lobe. Unchanged from 01/19/2021 compatible with a benign nodule. 7.  Aortic Atherosclerosis (ICD10-I70.0). Electronically Signed   By: Signa Kell M.D.   On: 01/28/2023 17:01    Procedures Procedures    Medications Ordered in ED Medications  lactated ringers bolus 500 mL (0 mLs Intravenous Stopped 01/28/23 1400)  acetaminophen (TYLENOL) tablet 650 mg (650 mg Oral Given 01/28/23  1250)  oxyCODONE (Oxy IR/ROXICODONE) immediate release tablet 5 mg (5 mg Oral Given 01/28/23 1358)  iohexol (OMNIPAQUE) 300 MG/ML solution 100 mL (100 mLs Intravenous Contrast Given 01/28/23 1434)  morphine (PF) 2 MG/ML injection 2 mg (2 mg Intravenous Given 01/28/23 1517)    ED Course/ Medical Decision Making/ A&P Clinical Course as of 01/28/23 1719  Thu Jan 28, 2023  1630 On reassessment he still has some chronic rectal pain.  She is on home 10 to 15 mg of oxycodone.  She has received oxycodone and morphine here with some improvement.  She otherwise feels well.  Multiple times to contact her facility but was unable to reach them.  CT scan is pending.  Workup is notable for sodium 128, which does appear new I do not think is causing her symptoms.  She has been normotensive here, no leukocytosis, no fever or signs of infection. [JD]    Clinical Course User Index [JD] Laurence Spates, MD                                 Medical Decision Making Amount and/or Complexity of Data Reviewed Labs: ordered. Radiology: ordered.  Risk OTC drugs. Prescription drug management.   Medical Decision Making:   Taren Lylianah Sikkenga is a 88 y.o. female who presented to the ED today with hypotension and rectal pain.  Vital signs were notable for normotension.  On exam she is well-appearing, she feels better although still has some rectal pain and lower abdominal pain.  On  exam she has mild lower abdominal tenderness but no signs of peritonitis.  She has no rectal prolapse currently.  She is having bowel movements and no vomiting, lower concern for obstruction.  However given her history and worsening abdominal pain today we will check CT scan.  Also obtain lab workup given reported low blood pressure although per chart review she was recent admitted for stroke and had a similar episode of hypotension which resolved.  Her blood pressure normally runs in the 90s or low 100s it appears that she is not far from her baseline.  No fever or signs of sepsis.   Patient placed on continuous vitals and telemetry monitoring while in ED which was reviewed periodically.  Reviewed and confirmed nursing documentation for past medical history, family history, social history.  Initial Study Results:   Laboratory  All laboratory results reviewed.  Labs notable for Na 128, anemia, UA without infection.  EKG EKG was reviewed independently. Rate, rhythm, axis, intervals all examined and without medically relevant abnormality. ST segments without concerns for elevations.    Reassessment and Plan:   On reassessment she still has some chronic rectal pain.  She remains normotensive, no hypotension here.  She is on home 10 to 15 mg of oxycodone.  She has received oxycodone and morphine here with significant improvement.  She otherwise feels well.  I attempted multiple times to contact her facility but was unable to reach them.  CT scan is pending.  Workup is notable for sodium 128, which does appear new but I do not think is causing her symptoms.  I did notify her of this finding including need for follow-up.  She has been normotensive here, no leukocytosis, no fever or signs of infection.   CT scan is pending, handoff was given at 5 PM to Dr. Rush Landmark with plan to follow-up imaging and reassess.   Patient's presentation is most  consistent with acute complicated illness / injury requiring  diagnostic workup.           Final Clinical Impression(s) / ED Diagnoses Final diagnoses:  Rectal pain    Rx / DC Orders ED Discharge Orders     None         Laurence Spates, MD 01/28/23 1720

## 2023-01-28 NOTE — ED Notes (Signed)
Pt. Used bed pan, vol was 

## 2023-01-28 NOTE — H&P (Signed)
History and Physical    Amy Mckay WUJ:[id number] DOB: [birth year] DOA: 01/28/2023  PCP: Annita Brod, MD  Patient coming from: [facility]  I have personally briefly reviewed patient's old medical records in [facility]  Chief Complaint: Rectal pain  HPI: Amy Mckay is a 88 y.o. female with medical history significant for COPD, chronic hypoxic respiratory failure on 3 L O2 via [place], PAD s/p SMA stenting, chronic hyponatremia, CVA, chronic rectal prolapse, anemia due to chronic blood loss, hypothyroidism, GERD, chronic dysphagia/esophageal dysmotility, chronic pain syndrome who presented to the ED for evaluation of rectal pain.  Patient has had issues with chronic rectal prolapse.  She says the prolapse reduced this morning however over the last 3 months it has primarily been out.  She has been having persistent rectal pain.  She states that she is having regular formed bowel movements but sees occasional mixed light or dark red blood in the stool.  She has been seen by [facility] for her rectal prolapse as well as esophageal dysmotility.  She has an appointment with surgery Dr. Michaell Cowing scheduled for 02/22/2023.  She has been having issues with alternating low and high blood pressures.  Reportedly her blood pressure was low at her facility which also prompted transfer to ED.  Patient reports occasional lightheadedness/dizziness without falls or loss of consciousness.  She says she normally ambulates with a walker or transports in wheelchair.  ED Course  Labs/Imaging on admission: I have personally reviewed following labs and imaging studies.  Initial vitals showed BP 109/84, pulse 76, RR 16, temp 98.8 F, SpO2 98% on room air.  Labs showed sodium 128, potassium 4.6, bicarb 26, BUN 19, creatinine 0.90, serum glucose 93, WBC 9.6, hemoglobin 10.6, platelets 410,000, lipase 30, UA negative for UTI.  CT abdomen/pelvis with contrast showed wall thickening and mucosal enhancement  involving the rectum suggestive of proctitis.  Again seen are signs of pelvic floor laxity with interval reduction of previous rectal prolapse noted on the exam from 11/03/2022.  Sigmoid diverticulosis without acute diverticulitis noted.  Stable appearance of increased caliber of the CBD and main pancreatic duct when compared to MRI 10/22/2015.  Stable cystic lesion within the uncinate process of pancreas measuring 9 mm when compared to MRI 2017.  6 mm subpleural nodule in the periphery of the RLL unchanged from 01/19/2021 and compatible with a benign nodule.  Patient was given 500 cc LR, IV morphine 2 mg, Oxy IR 5 mg, IV fentanyl 50 mcg.  ED physician discussed with on-call [facility] Dr. Ewing Schlein who recommended medical admission and their team will see in consultation in the morning with consideration of flexible sigmoidoscopy while in hospital.  The hospitalist service was consulted to admit for further evaluation and management.  Review of Systems: All systems reviewed and are negative except as documented in history of present illness above.   Past Medical History:  Diagnosis Date   Acid reflux    COPD (chronic obstructive pulmonary disease) (HCC)    Hypertension     Past Surgical History:  Procedure Laterality Date   BIOPSY  10/17/2021   Procedure: BIOPSY;  Surgeon: Charlott Rakes, MD;  Location: [facility];  Service: Gastroenterology;;   COLONOSCOPY N/A 10/17/2021   Procedure: COLONOSCOPY;  Surgeon: Charlott Rakes, MD;  Location: [facility];  Service: Gastroenterology;  Laterality: N/A;   COLONOSCOPY WITH PROPOFOL N/A 10/18/2021   Procedure: COLONOSCOPY WITH PROPOFOL;  Surgeon: Kathi Der, MD;  Location: [facility];  Service: Gastroenterology;  Laterality: N/A;   ESOPHAGOGASTRODUODENOSCOPY N/A 10/17/2021   Procedure: ESOPHAGOGASTRODUODENOSCOPY (EGD);  Surgeon: Charlott Rakes, MD;  Location: [facility];  Service: Gastroenterology;  Laterality: N/A;   POLYPECTOMY  10/18/2021    Procedure: POLYPECTOMY;  Surgeon: Kathi Der, MD;  Location: [facility];  Service: Gastroenterology;;    Social History:  reports that she has been smoking cigarettes. She has never used smokeless tobacco. She reports that she does not drink alcohol and does not use drugs.  Allergies  Allergen Reactions   Penicillins Anaphylaxis, Swelling and Other (See Comments)   Penicillin G Hives    No family history on file.   Prior to Admission medications   Medication Sig Start Date End Date Taking? Authorizing Provider  albuterol (VENTOLIN HFA) 108 (90 Base) MCG/ACT inhaler Inhale 1-2 puffs into the lungs every 6 (six) hours as needed for wheezing or shortness of breath.    [provider]  aspirin EC 81 MG tablet Take 1 tablet (81 mg total) by mouth daily. Hold for 1 week. May resume 10/26/21 if remains stable with worsening anemia/GI bleeds 10/18/21   Alessandra Bevels, MD  atorvastatin (LIPITOR) 40 MG tablet Take 1 tablet (40 mg total) by mouth daily. 11/06/22   Almon Hercules, MD  BREO ELLIPTA 200-25 MCG/ACT AEPB Inhale 1 puff into the lungs daily. 11/03/22   [provider]  Calcium Carb-Cholecalciferol (CALCIUM + VITAMIN D3 PO) Take 1 tablet by mouth daily.    [provider]  gabapentin (NEURONTIN) 800 MG tablet Take 800 mg by mouth 3 (three) times daily. 04/25/16   [provider]  hydrocortisone (ANUSOL-HC) 25 MG suppository Place 1 suppository (25 mg total) rectally 2 (two) times daily. Patient not taking: Reported on 11/03/2022 10/18/21   Alessandra Bevels, MD  levothyroxine (SYNTHROID, LEVOTHROID) 75 MCG tablet Take 75 mcg by mouth daily before breakfast. 02/05/16   [provider]  memantine (NAMENDA) 10 MG tablet Take 10 mg by mouth 2 (two) times daily.    [provider]  Multiple Vitamin (MULTIVITAMIN WITH MINERALS) TABS tablet Take 1 tablet by mouth daily with breakfast.    [provider]  pantoprazole (PROTONIX) 40 MG  tablet Take 1 tablet (40 mg total) by mouth 2 (two) times daily. 11/12/22 11/12/23  Kathlen Mody, MD  polyethylene glycol (MIRALAX / GLYCOLAX) 17 g packet Take 17 g by mouth 2 (two) times daily as needed for mild constipation. 11/06/22   Almon Hercules, MD  pramipexole (MIRAPEX) 0.25 MG tablet Take 0.25 mg by mouth 3 (three) times daily. 01/02/21   [provider]  senna-docusate (SENOKOT-S) 8.6-50 MG tablet Take 1 tablet by mouth 2 (two) times daily as needed for moderate constipation. 11/06/22   Almon Hercules, MD  ticagrelor (BRILINTA) 90 MG TABS tablet Take 1 tablet (90 mg total) by mouth 2 (two) times daily. 11/06/22   Almon Hercules, MD  vitamin C (ASCORBIC ACID) 500 MG tablet Take 500 mg by mouth daily.    [provider]    Physical Exam: Vitals:   01/28/23 1548 01/28/23 1630 01/28/23 1927 01/28/23 1928  BP:  (!) 156/86  (!) 177/95  Pulse:  80  90  Resp:  10  17  Temp: 98.1 F (36.7 C)   98.2 F (36.8 C)  TempSrc: Oral   Oral  SpO2:  97% 97%   Weight:      Height:       Constitutional: Elderly woman resting in bed, NAD, calm, comfortable  Eyes: EOMI, lids and conjunctivae normal ENMT: Mucous membranes are moist. Posterior pharynx clear of any exudate or lesions.Normal dentition.  Neck: normal, supple, no masses. Respiratory: clear to auscultation bilaterally, no wheezing, no crackles. Normal respiratory effort while on 3 L O2 via [place]. No accessory muscle use.  Cardiovascular: Regular rate and rhythm, no murmurs / rubs / gallops. No extremity edema. 2+ pedal pulses. Abdomen: no tenderness, no masses palpated.  Musculoskeletal: no clubbing / cyanosis. No joint deformity upper and lower extremities. Good ROM, no contractures. Normal muscle tone.  Skin: no rashes, lesions, ulcers. No induration Neurologic: Sensation intact. Strength 5/5 in all 4.  Psychiatric: Normal judgment and insight. Alert and oriented x 3. Normal mood.   EKG: Personally reviewed. Sinus rhythm, rate  89, RAE, no acute ischemic changes.  Similar to prior.  Assessment/Plan Principal Problem:   Proctitis Active Problems:   Iron deficiency anemia due to chronic blood loss   Esophageal dysphagia   Chronic lower back pain   COPD (chronic obstructive pulmonary disease) (HCC)   Chronic respiratory failure with hypoxia (HCC)   Peripheral arterial disease (HCC)   Hypothyroidism   Memory impairment   Chronic hyponatremia   History of CVA (cerebrovascular accident)   Amy Mckay is a 88 y.o. female with medical history significant for COPD, chronic hypoxic respiratory failure on 3 L O2 via [place], PAD s/p SMA stenting, chronic hyponatremia, CVA, chronic rectal prolapse, anemia due to chronic blood loss, hypothyroidism, GERD, chronic dysphagia/esophageal dysmotility, chronic pain syndrome who is admitted with proctitis in setting of chronic rectal prolapse.  Assessment and Plan: Chronic rectal prolapse with proctitis: Patient with persistent rectal pain.  CT A/P shows changes concerning for proctitis and interval reduction of previous rectal prolapse. -[facility] to follow -Possible flexible sigmoidoscopy during admission  Acute on chronic hyponatremia: Sodium 128 on admission.  Asymptomatic.  Started on IV NS @ 100 mL/hour and repeat labs in AM.  COPD Chronic respiratory failure with hypoxia: Stable on home 3 L O2 via [place].  Continue Breo Ellipta and albuterol as needed.  History of CVA: Admitted for left basal ganglia infarct in May 2014.  Continue aspirin, Brilinta, atorvastatin.  PAD s/p SMA stenting: On aspirin, Brilinta, atorvastatin as above.  Dysphagia/esophageal dysmotility: Follows with [facility].  Will be n.p.o. after midnight.  Hypothyroidism: Continue Synthroid.  Chronic pain syndrome: Continue home oxycodone and gabapentin.  Anemia due to chronic blood loss: Hemoglobin stable at 10.6.  Monitor while on DAPT.  Restless leg syndrome: Continue pramipexole.  Memory  impairment: Continue Namenda.   DVT prophylaxis: SCDs Start: 01/28/23 2018 Code Status: Full code, discussed with patient on admission.  Previously she was DNR last year however confirms this admission she would like full scope of care at least initially but no prolonged life support beyond 48-72 hours. Family Communication: Discussed with patient, she has discussed with family Disposition Plan: From SNF and likely discharge to SNF pending clinical progress Consults called: [facility] consulted by EDP, their team will see in the morning Severity of Illness: The appropriate patient status for this patient is OBSERVATION. Observation status is judged to be reasonable and necessary in order to provide the required intensity of service to ensure the patient's safety. The patient's presenting symptoms, physical exam findings, and initial radiographic and laboratory data in the context of their medical condition is felt to place them at decreased risk for further clinical deterioration. Furthermore, it is anticipated that the patient will be medically stable for  discharge from the hospital within 2 midnights of admission.   Darreld Mclean MD [facility]  If 7PM-7AM, please contact night-coverage [email or web address]  01/28/2023, 8:32 PM

## 2023-01-28 NOTE — ED Notes (Signed)
Spoke OTP with pt daughter. Pt daughter advised BP normally fluctuates between high and low for years. Pt. Also has GI surgical consult on 09/09 for prolapse issues

## 2023-01-28 NOTE — ED Notes (Signed)
Patient transported to CT 

## 2023-01-28 NOTE — ED Notes (Signed)
ED TO INPATIENT HANDOFF REPORT  Name/Age/Gender Amy Mckay 88 y.o. female  Code Status Code Status History     Date Active Date Inactive Code Status Order ID Comments User Context   11/03/2022 1706 11/12/2022 1636 Full Code [id number]  Sherlyn Lick, MD ED   10/13/2021 2016 10/19/2021 0409 DNR [id number]  Charlsie Quest, MD ED   01/29/2021 1158 02/02/2021 2105 Full Code [id number]  Lorin Glass, MD ED    Questions for Most Recent Historical Code Status (Order [id number])     Question Answer   By: Consent: discussion documented in EHR            Home/SNF/Other Nursing Home  Chief Complaint Proctitis [K62.89]  Level of Care/Admitting Diagnosis ED Disposition     ED Disposition  Admit   Condition  --   Comment  Hospital Area: [facility] [[id number]]  Level of Care: Med-Surg [16]  May place patient in observation at [facility] or [facility] if equivalent level of care is available:: No  Covid Evaluation: Asymptomatic - no recent exposure (last 10 days) testing not required  Diagnosis: Proctitis [272536]  Admitting Physician: Charlsie Quest [[id number]]  Attending Physician: Charlsie Quest [phone]          Medical History Past Medical History:  Diagnosis Date   Acid reflux    COPD (chronic obstructive pulmonary disease) (HCC)    Hypertension     Allergies Allergies  Allergen Reactions   Penicillins Anaphylaxis, Swelling and Other (See Comments)   Penicillin G Hives    IV Location/Drains/Wounds Patient Lines/Drains/Airways Status     Active Line/Drains/Airways     Name Placement date Placement time Site Days   Peripheral IV 01/28/23 22 G Left;Posterior Hand 01/28/23  0200  Hand  less than 1   Peripheral IV 01/28/23 20 G Anterior;Right Forearm 01/28/23  1320  Forearm  less than 1            Labs/Imaging Results for orders placed or performed during the hospital encounter of 01/28/23 (from the past 48 hour(s))   Urinalysis, Routine w reflex microscopic -Urine, Clean Catch     Status: Abnormal   Collection Time: 01/28/23 12:08 PM  Result Value Ref Range   Color, Urine STRAW (A) YELLOW   APPearance CLEAR CLEAR   Specific Gravity, Urine 1.005 1.005 - 1.030   pH 6.0 5.0 - 8.0   Glucose, UA NEGATIVE NEGATIVE mg/dL   Hgb urine dipstick NEGATIVE NEGATIVE   Bilirubin Urine NEGATIVE NEGATIVE   Ketones, ur NEGATIVE NEGATIVE mg/dL   Protein, ur NEGATIVE NEGATIVE mg/dL   Nitrite NEGATIVE NEGATIVE   Leukocytes,Ua NEGATIVE NEGATIVE    Comment: Performed at [facility], [address] [place], [place] [address]  CBC with Differential     Status: Abnormal   Collection Time: 01/28/23  1:09 PM  Result Value Ref Range   WBC 9.6 4.0 - 10.5 K/uL   RBC 3.95 3.87 - 5.11 MIL/uL   Hemoglobin 10.6 (L) 12.0 - 15.0 g/dL   HCT 33.2 (L) 95.1 - 88.4 %   MCV 86.3 80.0 - 100.0 fL   MCH 26.8 26.0 - 34.0 pg   MCHC 31.1 30.0 - 36.0 g/dL   RDW 16.6 (H) 06.3 - 01.6 %   Platelets 410 (H) 150 - 400 K/uL   nRBC 0.0 0.0 - 0.2 %   Neutrophils Relative % 67 %   Neutro Abs 6.4 1.7 - 7.7 K/uL  Lymphocytes Relative 14 %   Lymphs Abs 1.3 0.7 - 4.0 K/uL   Monocytes Relative 13 %   Monocytes Absolute 1.3 (H) 0.1 - 1.0 K/uL   Eosinophils Relative 5 %   Eosinophils Absolute 0.5 0.0 - 0.5 K/uL   Basophils Relative 0 %   Basophils Absolute 0.0 0.0 - 0.1 K/uL   Immature Granulocytes 1 %   Abs Immature Granulocytes 0.09 (H) 0.00 - 0.07 K/uL    Comment: Performed at [facility], [address] [place], [place] [address]  Comprehensive metabolic panel     Status: Abnormal   Collection Time: 01/28/23  1:09 PM  Result Value Ref Range   Sodium 128 (L) 135 - 145 mmol/L   Potassium 4.6 3.5 - 5.1 mmol/L   Chloride 90 (L) 98 - 111 mmol/L   CO2 26 22 - 32 mmol/L   Glucose, Bld 93 70 - 99 mg/dL    Comment: Glucose reference range applies only to samples taken after fasting for at least 8 hours.    BUN 19 8 - 23 mg/dL   Creatinine, Ser 6.04 0.44 - 1.00 mg/dL   Calcium 8.8 (L) 8.9 - 10.3 mg/dL   Total Protein 7.2 6.5 - 8.1 g/dL   Albumin 3.3 (L) 3.5 - 5.0 g/dL   AST 18 15 - 41 U/L   ALT 15 0 - 44 U/L   Alkaline Phosphatase 87 38 - 126 U/L   Total Bilirubin 0.2 (L) 0.3 - 1.2 mg/dL   GFR, Estimated >54 >09 mL/min    Comment: (NOTE) Calculated using the CKD-EPI Creatinine Equation (2021)    Anion gap 12 5 - 15    Comment: Performed at [facility], [address], [place], [place] [address]  Lipase, blood     Status: None   Collection Time: 01/28/23  1:09 PM  Result Value Ref Range   Lipase 30 11 - 51 U/L    Comment: Performed at [facility], [address], [place], [place] [address]   CT ABDOMEN PELVIS W CONTRAST  Result Date: 01/28/2023 CLINICAL DATA:  Lower abdominal pain.  Hypotension and rectal pain. EXAM: CT ABDOMEN AND PELVIS WITH CONTRAST TECHNIQUE: Multidetector CT imaging of the abdomen and pelvis was performed using the standard protocol following bolus administration of intravenous contrast. RADIATION DOSE REDUCTION: This exam was performed according to the departmental dose-optimization program which includes automated exposure control, adjustment of the mA and/or kV according to patient size and/or use of iterative reconstruction technique. CONTRAST:  OMNIPAQUE IOHEXOL 300 MG/ML  SOLN COMPARISON:  None Available. FINDINGS: Lower chest: 6 mm subpleural nodule in the periphery of the right lower lobe, image 10/4. unchanged compared with 01/19/2021. This is compatible with a benign nodule require no further follow-up. Subsegmental atelectasis versus scarring is identified within both lung bases, right greater than left. No pleural effusion identified. Hepatobiliary: No suspicious liver abnormality. Mild gallbladder wall thickening measures up to 4 mm, image 37/2. Similar to 5/20 1/24. No gallstones identified. Unchanged increased  caliber of the common bile duct which measures up to 9 mm, image 40/5. This is similar in appearance when compared with MRI from 10/22/2015. Cystic lesion within the uncinate process of the pancreas is stable measuring 9 mm when compared with the MR from 2017. Similar appearance of increased caliber of the main duct at the level of the head and neck of pancreas which measures up to 4 mm, image 36/2. Gallbladder wall enhancement and  mild thickening measures Pancreas: Unremarkable. No pancreatic ductal dilatation or surrounding inflammatory changes. Spleen: Normal in size without focal abnormality. Adrenals/Urinary Tract: Normal adrenal glands. Bilateral renal cortical scarring. No nephrolithiasis, hydronephrosis, or suspicious mass. Moderate distension of the urinary bladder without focal abnormality Stomach/Bowel: Stomach appears normal. No pathologic dilatation of the large or small bowel loops. Sigmoid diverticulosis without signs of acute diverticulitis. Wall thickening and mucosal enhancement involving the rectum is identified. Again seen are signs of pelvic floor laxity with there is been interval reduction of previous rectal prolapse noted on the exam from 11/03/2022. Vascular/Lymphatic: Aortic atherosclerosis. No aneurysm. No sign of abdominopelvic adenopathy. Reproductive: Status post hysterectomy. No adnexal masses. Other: No free fluid or fluid collections. No signs of pneumoperitoneum Musculoskeletal: Curvature of the lumbar spine is convex towards the left. Multi level degenerative disc disease identified. No acute or suspicious osseous abnormality. IMPRESSION: 1. Wall thickening and mucosal enhancement involving the rectum is identified. Findings are suggestive of proctitis. 2. Again seen are signs of pelvic floor laxity with interval reduction of previous rectal prolapse noted on the exam from 11/03/2022. 3. Sigmoid diverticulosis without signs of acute diverticulitis. 4. Stable appearance of increased  caliber of the common bile duct and main pancreatic duct when compared with MRI from 10/22/2015. 5. Stable cystic lesion within the uncinate process of the pancreas measuring 9 mm when compared with the MR from 2017. 6. 6 mm subpleural nodule in the periphery of the right lower lobe. Unchanged from 01/19/2021 compatible with a benign nodule. 7.  Aortic Atherosclerosis (ICD10-I70.0). Electronically Signed   By: Signa Kell M.D.   On: 01/28/2023 17:01    Pending Labs Unresulted Labs (From admission, onward)    None       Vitals/Pain Today's Vitals   01/28/23 1630 01/28/23 1925 01/28/23 1927 01/28/23 1928  BP: (!) 156/86   (!) 177/95  Pulse: 80   90  Resp: 10   17  Temp:    98.2 F (36.8 C)  TempSrc:    Oral  SpO2: 97%  97%   Weight:      Height:      PainSc:  5       Isolation Precautions No active isolations  Medications Medications  lactated ringers bolus 500 mL (0 mLs Intravenous Stopped 01/28/23 1400)  acetaminophen (TYLENOL) tablet 650 mg (650 mg Oral Given 01/28/23 1250)  oxyCODONE (Oxy IR/ROXICODONE) immediate release tablet 5 mg (5 mg Oral Given 01/28/23 1358)  iohexol (OMNIPAQUE) 300 MG/ML solution 100 mL (100 mLs Intravenous Contrast Given 01/28/23 1434)  morphine (PF) 2 MG/ML injection 2 mg (2 mg Intravenous Given 01/28/23 1517)  fentaNYL (SUBLIMAZE) injection 50 mcg (50 mcg Intravenous Given 01/28/23 1847)    Mobility walks with device

## 2023-01-28 NOTE — ED Triage Notes (Addendum)
Pt BIB EMS from [facility] with hypotension and rectal pain pt states her bp normally runs low,hx rectal prolapse and dementia.

## 2023-01-28 NOTE — Hospital Course (Addendum)
Amy Mckay is a 88 y.o. female with a history of COPD, chronic respiratory failure on oxygen, mesenteric ischemia status post SMA stenting, chronic hyponatremia, CVA, chronic rectal prolapse, iron deficiency anemia secondary to chronic blood loss, hypothyroidism, GERD, chronic dysphagia/esophageal dysmotility, chronic pain syndrome.  Patient presented secondary to rectal pain in setting of known chronic rectal prolapse.  CT imaging confirmed evidence of proctitis.  Patient made n.p.o. and [facility] consulted for evaluation/management.

## 2023-01-29 ENCOUNTER — Encounter (HOSPITAL_COMMUNITY)
Admission: EM | Disposition: A | Discharge status: Skilled Nursing Facility | Payer: Self-pay | Source: Skilled Nursing Facility | Attending: Family Medicine

## 2023-01-29 ENCOUNTER — Encounter (HOSPITAL_COMMUNITY): Payer: Self-pay | Admitting: Internal Medicine

## 2023-01-29 ENCOUNTER — Observation Stay (HOSPITAL_COMMUNITY): Payer: 59 | Admitting: Anesthesiology

## 2023-01-29 DIAGNOSIS — K623 Rectal prolapse: Secondary | ICD-10-CM | POA: Diagnosis present

## 2023-01-29 DIAGNOSIS — I700 Atherosclerosis of aorta: Secondary | ICD-10-CM | POA: Diagnosis present

## 2023-01-29 DIAGNOSIS — Z9981 Dependence on supplemental oxygen: Secondary | ICD-10-CM | POA: Diagnosis not present

## 2023-01-29 DIAGNOSIS — D500 Iron deficiency anemia secondary to blood loss (chronic): Secondary | ICD-10-CM | POA: Diagnosis present

## 2023-01-29 DIAGNOSIS — Z7982 Long term (current) use of aspirin: Secondary | ICD-10-CM | POA: Diagnosis not present

## 2023-01-29 DIAGNOSIS — F0390 Unspecified dementia without behavioral disturbance: Secondary | ICD-10-CM | POA: Diagnosis present

## 2023-01-29 DIAGNOSIS — J449 Chronic obstructive pulmonary disease, unspecified: Secondary | ICD-10-CM | POA: Diagnosis not present

## 2023-01-29 DIAGNOSIS — K6289 Other specified diseases of anus and rectum: Secondary | ICD-10-CM | POA: Diagnosis present

## 2023-01-29 DIAGNOSIS — R1314 Dysphagia, pharyngoesophageal phase: Secondary | ICD-10-CM | POA: Diagnosis present

## 2023-01-29 DIAGNOSIS — K224 Dyskinesia of esophagus: Secondary | ICD-10-CM | POA: Diagnosis present

## 2023-01-29 DIAGNOSIS — I10 Essential (primary) hypertension: Secondary | ICD-10-CM | POA: Diagnosis not present

## 2023-01-29 DIAGNOSIS — I739 Peripheral vascular disease, unspecified: Secondary | ICD-10-CM | POA: Diagnosis present

## 2023-01-29 DIAGNOSIS — Z880 Allergy status to penicillin: Secondary | ICD-10-CM | POA: Diagnosis not present

## 2023-01-29 DIAGNOSIS — Z79899 Other long term (current) drug therapy: Secondary | ICD-10-CM | POA: Diagnosis not present

## 2023-01-29 DIAGNOSIS — E039 Hypothyroidism, unspecified: Secondary | ICD-10-CM | POA: Diagnosis present

## 2023-01-29 DIAGNOSIS — Z79890 Hormone replacement therapy (postmenopausal): Secondary | ICD-10-CM | POA: Diagnosis not present

## 2023-01-29 DIAGNOSIS — F17210 Nicotine dependence, cigarettes, uncomplicated: Secondary | ICD-10-CM

## 2023-01-29 DIAGNOSIS — G2581 Restless legs syndrome: Secondary | ICD-10-CM | POA: Diagnosis present

## 2023-01-29 DIAGNOSIS — E871 Hypo-osmolality and hyponatremia: Secondary | ICD-10-CM | POA: Diagnosis present

## 2023-01-29 DIAGNOSIS — Z7902 Long term (current) use of antithrombotics/antiplatelets: Secondary | ICD-10-CM | POA: Diagnosis not present

## 2023-01-29 DIAGNOSIS — R413 Other amnesia: Secondary | ICD-10-CM | POA: Diagnosis present

## 2023-01-29 DIAGNOSIS — J9611 Chronic respiratory failure with hypoxia: Secondary | ICD-10-CM | POA: Diagnosis present

## 2023-01-29 DIAGNOSIS — Z8673 Personal history of transient ischemic attack (TIA), and cerebral infarction without residual deficits: Secondary | ICD-10-CM | POA: Diagnosis not present

## 2023-01-29 DIAGNOSIS — G894 Chronic pain syndrome: Secondary | ICD-10-CM | POA: Diagnosis present

## 2023-01-29 HISTORY — PX: BIOPSY: SHX5522

## 2023-01-29 HISTORY — PX: FLEXIBLE SIGMOIDOSCOPY: SHX5431

## 2023-01-29 LAB — CBC
HCT: 36.3 % (ref 36.0–46.0)
Hemoglobin: 11.2 g/dL — ABNORMAL LOW (ref 12.0–15.0)
MCH: 26.5 pg (ref 26.0–34.0)
MCHC: 30.9 g/dL (ref 30.0–36.0)
MCV: 85.8 fL (ref 80.0–100.0)
Platelets: 428 10*3/uL — ABNORMAL HIGH (ref 150–400)
RBC: 4.23 MIL/uL (ref 3.87–5.11)
RDW: 17.4 % — ABNORMAL HIGH (ref 11.5–15.5)
WBC: 8.5 10*3/uL (ref 4.0–10.5)
nRBC: 0 % (ref 0.0–0.2)

## 2023-01-29 LAB — BASIC METABOLIC PANEL
Anion gap: 12 (ref 5–15)
BUN: 12 mg/dL (ref 8–23)
CO2: 28 mmol/L (ref 22–32)
Calcium: 9 mg/dL (ref 8.9–10.3)
Chloride: 89 mmol/L — ABNORMAL LOW (ref 98–111)
Creatinine, Ser: 0.55 mg/dL (ref 0.44–1.00)
GFR, Estimated: >60 mL/min (ref >60)
Glucose, Bld: 92 mg/dL (ref 70–99)
Potassium: 4 mmol/L (ref 3.5–5.1)
Sodium: 129 mmol/L — ABNORMAL LOW (ref 135–145)

## 2023-01-29 SURGERY — SIGMOIDOSCOPY, FLEXIBLE
Anesthesia: Monitor Anesthesia Care

## 2023-01-29 MED ORDER — PROPOFOL 500 MG/50ML IV EMUL
INTRAVENOUS | Status: DC | PRN
  Administered 2023-01-29: 75 ug/kg/min via INTRAVENOUS

## 2023-01-29 MED ORDER — LACTATED RINGERS IV SOLN: INTRAVENOUS | Status: DC

## 2023-01-29 MED ORDER — LACTATED RINGERS IV SOLN: INTRAVENOUS | Status: DC | PRN

## 2023-01-29 MED ORDER — LIDOCAINE HCL (CARDIAC) PF 100 MG/5ML IV SOSY
PREFILLED_SYRINGE | INTRAVENOUS | Status: DC | PRN
  Administered 2023-01-29: 50 mg via INTRATRACHEAL

## 2023-01-29 MED ORDER — SODIUM CHLORIDE 0.9 % IV SOLN: INTRAVENOUS | Status: DC

## 2023-01-29 MED ORDER — MORPHINE SULFATE (PF) 2 MG/ML IV SOLN
2.0000 mg | INTRAVENOUS | Status: DC | PRN
  Administered 2023-01-29 – 2023-01-30 (×4): 2 mg via INTRAVENOUS
  Filled 2023-01-29 (×4): qty 1

## 2023-01-29 MED ORDER — MORPHINE SULFATE (PF) 2 MG/ML IV SOLN
2.0000 mg | INTRAVENOUS | Status: DC | PRN
  Administered 2023-01-29: 2 mg via INTRAVENOUS
  Filled 2023-01-29: qty 1

## 2023-01-29 MED ORDER — MORPHINE SULFATE (PF) 2 MG/ML IV SOLN
2.0000 mg | Freq: Once | INTRAVENOUS | Status: AC
  Administered 2023-01-29: 2 mg via INTRAVENOUS
  Filled 2023-01-29: qty 1

## 2023-01-29 NOTE — Progress Notes (Signed)
Mobility Specialist - Progress Note   01/29/23 0900  Mobility  Activity Transferred to/from [facility]  Level of Assistance Standby assist, set-up cues, supervision of patient - no hands on  Assistive Device BSC  Distance Ambulated (ft) 3 ft  Range of Motion/Exercises Active  Activity Response Tolerated well  $Mobility charge 1 Mobility  Mobility Specialist Start Time (ACUTE ONLY) [id number]  Mobility Specialist Stop Time (ACUTE ONLY) 0911  Mobility Specialist Time Calculation (min) (ACUTE ONLY) 6 min   Pt received in bed requesting to go to [facility]. Attempted to convince pt to ambulate further, pt claimed she did not need help walking, confused as to why I was trying to walk with her, attempted more convincing, educating on benefits of ambulating during hospital stay, did not work.  Left on BSC, notified NT.   Marilynne Halsted Mobility Specialist

## 2023-01-29 NOTE — [place] FL2 (Signed)
[place] MEDICAID [facility] LEVEL OF CARE FORM     IDENTIFICATION  Patient Name: Amy Mckay Birthdate: 05-06-1935 Sex: female Admission Date (Current Location): 01/28/2023  [facility] and [place] Number:  Producer, television/film/video and Address:  [facility],  [address] 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   History of CVA (cerebrovascular accident) 11/03/2022   Heme positive stool 10/17/2021   Esophageal dysphagia 10/14/2021   Acute toxic encephalopathy 10/14/2021   Orthostatic hypotension 10/14/2021   Near syncope 10/13/2021   Iron deficiency anemia due to chronic blood loss 10/13/2021   Chronic respiratory failure with hypoxia (HCC) 10/13/2021   Chronic lower back pain 10/13/2021   Tobacco use disorder 10/13/2021   Pulmonary nodules/lesions, multiple 10/13/2021   Acute on chronic respiratory failure with hypoxia (HCC) 02/02/2021   COVID-19 virus infection 02/01/2021   AKI (acute kidney injury) (HCC) 02/01/2021   Peripheral arterial disease (HCC) 02/01/2021   Benign essential HTN 02/01/2021   COPD (chronic obstructive pulmonary disease) (HCC) 02/01/2021   Cigarette smoker 02/01/2021   Hypothyroidism 02/01/2021   Memory impairment 02/01/2021   Restless leg 02/01/2021   Chronic hyponatremia 02/01/2021   Colitis 01/29/2021    Orientation RESPIRATION BLADDER Height & Weight     Self, Time, Situation, Place  O2 Continent Weight: 121 lb 4.1 oz (55 kg) Height:  5\' 1"  (154.9 cm)  BEHAVIORAL SYMPTOMS/MOOD NEUROLOGICAL BOWEL  NUTRITION STATUS      Continent Diet (See discharge summary)  AMBULATORY STATUS COMMUNICATION OF NEEDS Skin   Supervision Verbally Normal                       Personal Care Assistance Level of Assistance  Bathing, Feeding, Dressing Bathing Assistance: Limited assistance Feeding assistance: Independent Dressing Assistance: Limited assistance     Functional Limitations Info  Sight, Hearing, Speech Sight Info: Adequate Hearing Info: Impaired Speech Info: Adequate    SPECIAL CARE FACTORS FREQUENCY  PT (By licensed PT), OT (By licensed OT)     PT Frequency: 5x/wk OT Frequency: 5x/wk            Contractures Contractures Info: Not present    Additional Factors Info  Code Status, Allergies Code Status Info: FULL Allergies Info: Penicillins, Penicillin G           Current Medications (01/29/2023):  This is the current hospital active medication list Current Facility-Administered Medications  Medication Dose Route Frequency Provider Last Rate Last Admin   0.9 %  sodium chloride infusion   Intravenous Continuous Narda Bonds, MD 100 mL/hr at 01/29/23 0817 New Bag at 01/29/23 0817   0.9 %  sodium chloride infusion   Intravenous Continuous Charlott Rakes, MD 20 mL/hr at 01/29/23 1447 Rate Change at 01/29/23 1447   [MAR Hold] acetaminophen (TYLENOL) tablet 650 mg  650 mg Oral Q6H PRN Charlsie Quest, MD       Or   Mitzi Hansen Hold] acetaminophen (TYLENOL) suppository 650 mg  650 mg Rectal Q6H PRN Charlsie Quest, MD       [  MAR Hold] albuterol (PROVENTIL) (2.5 MG/3ML) 0.083% nebulizer solution 2.5 mg  2.5 mg Nebulization Q6H PRN Charlsie Quest, MD       [MAR Hold] aspirin EC tablet 81 mg  81 mg Oral Daily Charlsie Quest, MD       [MAR Hold] atorvastatin (LIPITOR) tablet 40 mg  40 mg Oral Daily Charlsie Quest, MD       [MAR Hold] fluticasone furoate-vilanterol (BREO ELLIPTA) 200-25 MCG/ACT 1 puff  1 puff Inhalation Daily Charlsie Quest, MD   1 puff at 01/29/23 0738   [MAR  Hold] gabapentin (NEURONTIN) capsule 800 mg  800 mg Oral TID Charlsie Quest, MD       [facility] Hold] levothyroxine (SYNTHROID) tablet 75 mcg  75 mcg Oral Q0600 Charlsie Quest, MD       [MAR Hold] memantine [facility]) tablet 10 mg  10 mg Oral BID Charlsie Quest, MD       [facility] Hold] morphine (PF) 2 MG/ML injection 2 mg  2 mg Intravenous Q2H PRN Narda Bonds, MD   2 mg at 01/29/23 1113   [MAR Hold] ondansetron (ZOFRAN) tablet 4 mg  4 mg Oral Q6H PRN Charlsie Quest, MD       Or   Mitzi Hansen Hold] ondansetron (ZOFRAN) injection 4 mg  4 mg Intravenous Q6H PRN Charlsie Quest, MD       [MAR Hold] oxyCODONE (Oxy IR/ROXICODONE) immediate release tablet 10 mg  10 mg Oral Q6H PRN Charlsie Quest, MD   10 mg at 01/28/23 2212   [MAR Hold] pantoprazole (PROTONIX) EC tablet 40 mg  40 mg Oral BID AC Patel, Floreen Comber, MD       [MAR Hold] pramipexole (MIRAPEX) tablet 0.25 mg  0.25 mg Oral TID Charlsie Quest, MD         Discharge Medications: Please see discharge summary for a list of discharge medications.  Relevant Imaging Results:  Relevant Lab Results:   Additional Information SSN: [id number]  Otelia Santee, LCSW

## 2023-01-29 NOTE — Consult Note (Signed)
Referring Provider: Dr. Caleb Popp Primary Care Physician:  Annita Brod, MD Primary Gastroenterologist:  Dr. Dulce Sellar  Reason for Consultation:  Proctitis; Abnormal CT   HPI: Amy Mckay is a 88 y.o. female with multiple medical problems and known history of rectal prolapse that is being evaluated by surgery in early September seen for a consult due to CT showing proctitis. Patient reports rectal bleeding at her SNF but she is not able to tell me the details of the bleeding. Denies diarrhea. Colonoscopy X 2 in May 2023 and rectal biopsies were negative for active inflammation. Small tubular adenoma removed from proximal colon. Spoke to daughter, [name] by phone who states that the prolapse has been evaluated by 2 GI docs and she has an appt with [facility] in September.  Past Medical History:  Diagnosis Date   Acid reflux    COPD (chronic obstructive pulmonary disease) (HCC)    Hypertension     Past Surgical History:  Procedure Laterality Date   BIOPSY  10/17/2021   Procedure: BIOPSY;  Surgeon: Charlott Rakes, MD;  Location: [facility];  Service: Gastroenterology;;   COLONOSCOPY N/A 10/17/2021   Procedure: COLONOSCOPY;  Surgeon: Charlott Rakes, MD;  Location: [facility];  Service: Gastroenterology;  Laterality: N/A;   COLONOSCOPY WITH PROPOFOL N/A 10/18/2021   Procedure: COLONOSCOPY WITH PROPOFOL;  Surgeon: Kathi Der, MD;  Location: [facility];  Service: Gastroenterology;  Laterality: N/A;   ESOPHAGOGASTRODUODENOSCOPY N/A 10/17/2021   Procedure: ESOPHAGOGASTRODUODENOSCOPY (EGD);  Surgeon: Charlott Rakes, MD;  Location: [facility];  Service: Gastroenterology;  Laterality: N/A;   POLYPECTOMY  10/18/2021   Procedure: POLYPECTOMY;  Surgeon: Kathi Der, MD;  Location: [facility];  Service: Gastroenterology;;    Prior to Admission medications   Medication Sig Start Date End Date Taking? Authorizing Provider  albuterol (VENTOLIN HFA) 108 (90 Base) MCG/ACT inhaler Inhale  1-2 puffs into the lungs every 6 (six) hours as needed for wheezing or shortness of breath.    [provider]  aspirin EC 81 MG tablet Take 1 tablet (81 mg total) by mouth daily. Hold for 1 week. May resume 10/26/21 if remains stable with worsening anemia/GI bleeds 10/18/21   Alessandra Bevels, MD  atorvastatin (LIPITOR) 40 MG tablet Take 1 tablet (40 mg total) by mouth daily. 11/06/22   Almon Hercules, MD  BREO ELLIPTA 200-25 MCG/ACT AEPB Inhale 1 puff into the lungs daily. 11/03/22   [provider]  Calcium Carb-Cholecalciferol (CALCIUM + VITAMIN D3 PO) Take 1 tablet by mouth daily.    [provider]  gabapentin (NEURONTIN) 800 MG tablet Take 800 mg by mouth 3 (three) times daily. 04/25/16   [provider]  hydrocortisone (ANUSOL-HC) 25 MG suppository Place 1 suppository (25 mg total) rectally 2 (two) times daily. Patient not taking: Reported on 11/03/2022 10/18/21   Alessandra Bevels, MD  levothyroxine (SYNTHROID, LEVOTHROID) 75 MCG tablet Take 75 mcg by mouth daily before breakfast. 02/05/16   [provider]  memantine (NAMENDA) 10 MG tablet Take 10 mg by mouth 2 (two) times daily.    [provider]  Multiple Vitamin (MULTIVITAMIN WITH MINERALS) TABS tablet Take 1 tablet by mouth daily with breakfast.    [provider]  pantoprazole (PROTONIX) 40 MG tablet Take 1 tablet (40 mg total) by mouth 2 (two) times daily. 11/12/22 11/12/23  Kathlen Mody, MD  polyethylene glycol (MIRALAX / GLYCOLAX) 17 g packet Take 17 g by mouth 2 (two) times daily as needed for mild constipation. 11/06/22   Alanda Slim,  Boyce Medici, MD  pramipexole (MIRAPEX) 0.25 MG tablet Take 0.25 mg by mouth 3 (three) times daily. 01/02/21   [provider]  senna-docusate (SENOKOT-S) 8.6-50 MG tablet Take 1 tablet by mouth 2 (two) times daily as needed for moderate constipation. 11/06/22   Almon Hercules, MD  ticagrelor (BRILINTA) 90 MG TABS tablet Take 1 tablet (90 mg total) by  mouth 2 (two) times daily. 11/06/22   Almon Hercules, MD  vitamin C (ASCORBIC ACID) 500 MG tablet Take 500 mg by mouth daily.    [provider]    Scheduled Meds:  aspirin EC  81 mg Oral Daily   atorvastatin  40 mg Oral Daily   fluticasone furoate-vilanterol  1 puff Inhalation Daily   gabapentin  800 mg Oral TID   levothyroxine  75 mcg Oral Q0600   memantine  10 mg Oral BID   pantoprazole  40 mg Oral BID AC   pramipexole  0.25 mg Oral TID   ticagrelor  90 mg Oral BID   Continuous Infusions:  sodium chloride 100 mL/hr at 01/29/23 0817   PRN Meds:.acetaminophen **OR** acetaminophen, albuterol, morphine injection, ondansetron **OR** ondansetron (ZOFRAN) IV, oxyCODONE  Allergies as of 01/28/2023 - Review Complete 01/28/2023  Allergen Reaction Noted   Penicillins Anaphylaxis, Swelling, and Other (See Comments) 05/14/2016   Penicillin g Hives 11/03/2022    No family history on file.  Social History   Socioeconomic History   Marital status: Single    Spouse name: Not on file   Number of children: Not on file   Years of education: Not on file   Highest education level: Not on file  Occupational History   Not on file  Tobacco Use   Smoking status: Every Day    Current packs/day: 1.50    Types: Cigarettes   Smokeless tobacco: Never  Vaping Use   Vaping status: Never Used  Substance and Sexual Activity   Alcohol use: No   Drug use: No   Sexual activity: Not on file  Other Topics Concern   Not on file  Social History Narrative   Not on file   Social Determinants of Health   Financial Resource Strain: Not on file  Food Insecurity: No Food Insecurity (01/28/2023)   Hunger Vital Sign    Worried About Running Out of Food in the Last Year: Never true    Ran Out of Food in the Last Year: Never true  Transportation Needs: No Transportation Needs (01/28/2023)   PRAPARE - Administrator, Civil Service (Medical): No    Lack of Transportation (Non-Medical):  No  Physical Activity: Not on file  Stress: Not on file  Social Connections: Not on file  Intimate Partner Violence: Not At Risk (01/28/2023)   Humiliation, Afraid, Rape, and Kick questionnaire    Fear of Current or Ex-Partner: No    Emotionally Abused: No    Physically Abused: No    Sexually Abused: No    Review of Systems: All negative except as stated above in HPI.  Physical Exam: Vital signs: Vitals:   01/29/23 0738 01/29/23 0835  BP:  (!) 140/95  Pulse:  98  Resp:  16  Temp:  98.5 F (36.9 C)  SpO2: 98% 96%   Last BM Date : 01/25/23 General:   chronically ill-appearing, elderly, thin, no acute distress  Head: normocephalic, atraumatic Eyes: anicteric sclera ENT: oropharynx clear Neck: supple, nontender Lungs:  Clear throughout to auscultation.   No wheezes,  crackles, or rhonchi. No acute distress. Heart:  Regular rate and rhythm; no murmurs, clicks, rubs,  or gallops. Abdomen: soft, nontender, nondistended, +BS  Rectal:  Deferred Ext: no edema  GI:  Lab Results: Recent Labs    01/28/23 1309 01/29/23 0509  WBC 9.6 8.5  HGB 10.6* 11.2*  HCT 34.1* 36.3  PLT 410* 428*   BMET Recent Labs    01/28/23 1309 01/29/23 0509  NA 128* 129*  K 4.6 4.0  CL 90* 89*  CO2 26 28  GLUCOSE 93 92  BUN 19 12  CREATININE 0.90 0.55  CALCIUM 8.8* 9.0   LFT Recent Labs    01/28/23 1309  PROT 7.2  ALBUMIN 3.3*  AST 18  ALT 15  ALKPHOS 87  BILITOT 0.2*   PT/INR No results for input(s): "LABPROT", "INR" in the last 72 hours.   Studies/Results: CT ABDOMEN PELVIS W CONTRAST  Result Date: 01/28/2023 CLINICAL DATA:  Lower abdominal pain.  Hypotension and rectal pain. EXAM: CT ABDOMEN AND PELVIS WITH CONTRAST TECHNIQUE: Multidetector CT imaging of the abdomen and pelvis was performed using the standard protocol following bolus administration of intravenous contrast. RADIATION DOSE REDUCTION: This exam was performed according to the departmental dose-optimization  program which includes automated exposure control, adjustment of the mA and/or kV according to patient size and/or use of iterative reconstruction technique. CONTRAST:  OMNIPAQUE IOHEXOL 300 MG/ML  SOLN COMPARISON:  None Available. FINDINGS: Lower chest: 6 mm subpleural nodule in the periphery of the right lower lobe, image 10/4. unchanged compared with 01/19/2021. This is compatible with a benign nodule require no further follow-up. Subsegmental atelectasis versus scarring is identified within both lung bases, right greater than left. No pleural effusion identified. Hepatobiliary: No suspicious liver abnormality. Mild gallbladder wall thickening measures up to 4 mm, image 37/2. Similar to 5/20 1/24. No gallstones identified. Unchanged increased caliber of the common bile duct which measures up to 9 mm, image 40/5. This is similar in appearance when compared with MRI from 10/22/2015. Cystic lesion within the uncinate process of the pancreas is stable measuring 9 mm when compared with the MR from 2017. Similar appearance of increased caliber of the main duct at the level of the head and neck of pancreas which measures up to 4 mm, image 36/2. Gallbladder wall enhancement and mild thickening measures Pancreas: Unremarkable. No pancreatic ductal dilatation or surrounding inflammatory changes. Spleen: Normal in size without focal abnormality. Adrenals/Urinary Tract: Normal adrenal glands. Bilateral renal cortical scarring. No nephrolithiasis, hydronephrosis, or suspicious mass. Moderate distension of the urinary bladder without focal abnormality Stomach/Bowel: Stomach appears normal. No pathologic dilatation of the large or small bowel loops. Sigmoid diverticulosis without signs of acute diverticulitis. Wall thickening and mucosal enhancement involving the rectum is identified. Again seen are signs of pelvic floor laxity with there is been interval reduction of previous rectal prolapse noted on the exam from  11/03/2022. Vascular/Lymphatic: Aortic atherosclerosis. No aneurysm. No sign of abdominopelvic adenopathy. Reproductive: Status post hysterectomy. No adnexal masses. Other: No free fluid or fluid collections. No signs of pneumoperitoneum Musculoskeletal: Curvature of the lumbar spine is convex towards the left. Multi level degenerative disc disease identified. No acute or suspicious osseous abnormality. IMPRESSION: 1. Wall thickening and mucosal enhancement involving the rectum is identified. Findings are suggestive of proctitis. 2. Again seen are signs of pelvic floor laxity with interval reduction of previous rectal prolapse noted on the exam from 11/03/2022. 3. Sigmoid diverticulosis without signs of acute diverticulitis. 4. Stable appearance of  increased caliber of the common bile duct and main pancreatic duct when compared with MRI from 10/22/2015. 5. Stable cystic lesion within the uncinate process of the pancreas measuring 9 mm when compared with the MR from 2017. 6. 6 mm subpleural nodule in the periphery of the right lower lobe. Unchanged from 01/19/2021 compatible with a benign nodule. 7.  Aortic Atherosclerosis (ICD10-I70.0). Electronically Signed   By: Signa Kell M.D.   On: 01/28/2023 17:01    Impression/Plan: Rectal prolapse with rectal inflammation on CT scan that is likely reactive changes from the rectal prolapse. Since has been over a year since last colonoscopy, will do a flex sig to further evaluate the inflammation noted on CT scan. NPO for flex sig to be done today.    LOS: 0 days   Shirley Friar  01/29/2023, 10:42 AM  Questions please call [phone]

## 2023-01-29 NOTE — Anesthesia Preprocedure Evaluation (Addendum)
Anesthesia Evaluation  Patient identified by MRN, date of birth, ID band Patient awake    Reviewed: Allergy & Precautions, NPO status , Patient's Chart, lab work & pertinent test results  Airway Mallampati: II  TM Distance: >3 FB Neck ROM: Full    Dental  (+) Edentulous Upper, Chipped, Dental Advisory Given,    Pulmonary COPD (3L O2),  COPD inhaler and oxygen dependent, Current Smoker and Patient abstained from smoking.   Pulmonary exam normal breath sounds clear to auscultation       Cardiovascular hypertension, + Peripheral Vascular Disease  Normal cardiovascular exam Rhythm:Regular Rate:Normal  TTE 2024  1. Left ventricular ejection fraction, by estimation, is 60 to 65%. The  left ventricle has normal function. The left ventricle has no regional  wall motion abnormalities. Left ventricular diastolic parameters are  consistent with Grade I diastolic  dysfunction (impaired relaxation).   2. Right ventricular systolic function is normal. The right ventricular  size is normal.   3. The mitral valve is normal in structure. No evidence of mitral valve  regurgitation. No evidence of mitral stenosis. Moderate mitral annular  calcification.   4. The aortic valve is normal in structure. Aortic valve regurgitation is  not visualized. No aortic stenosis is present.   5. The inferior vena cava is normal in size with greater than 50%  respiratory variability, suggesting right atrial pressure of 3 mmHg.     Neuro/Psych CVA (CVA 10/2022 residual lower extremity numbness), Residual Symptoms  negative psych ROS   GI/Hepatic Neg liver ROS,GERD  ,,  Endo/Other  Hypothyroidism  Lab Results      Component                Value               Date                      NA                       129 (L)             01/29/2023                CL                       89 (L)              01/29/2023                K                        4.0                  01/29/2023                CO2                      28                  01/29/2023                BUN                      12                  01/29/2023  CREATININE               0.55                01/29/2023                GFRNONAA                 >60                 01/29/2023                CALCIUM                  9.0                 01/29/2023                PHOS                     4.0                 11/08/2022                ALBUMIN                  3.3 (L)             01/28/2023                GLUCOSE                  92                  01/29/2023             Renal/GU negative Renal ROS  negative genitourinary   Musculoskeletal negative musculoskeletal ROS (+)    Abdominal   Peds  Hematology negative hematology ROS (+)   Anesthesia Other Findings 88 y.o. female with a history of COPD, chronic respiratory failure on oxygen, mesenteric ischemia status post SMA stenting, chronic hyponatremia, CVA, chronic rectal prolapse, iron deficiency anemia secondary to chronic blood loss, hypothyroidism, GERD, chronic dysphagia/esophageal dysmotility, chronic pain syndrome.  Patient presented secondary to rectal pain in setting of known chronic rectal prolapse.  CT imaging confirmed evidence of proctitis  Reproductive/Obstetrics                             Anesthesia Physical Anesthesia Plan  ASA: 3  Anesthesia Plan: MAC   Post-op Pain Management:    Induction: Intravenous  PONV Risk Score and Plan: Propofol infusion and Treatment may vary due to age or medical condition  Airway Management Planned: Natural Airway  Additional Equipment:   Intra-op Plan:   Post-operative Plan:   Informed Consent: I have reviewed the patients History and Physical, chart, labs and discussed the procedure including the risks, benefits and alternatives for the proposed anesthesia with the patient or authorized representative who has indicated his/her  understanding and acceptance.     Dental advisory given  Plan Discussed with: CRNA  Anesthesia Plan Comments:        Anesthesia Quick Evaluation

## 2023-01-29 NOTE — Progress Notes (Signed)
PROGRESS NOTE    Amy Mckay  HYQ:[id number] DOB: 28-Oct-1934 DOA: 01/28/2023 PCP: Annita Brod, MD   Brief Narrative: Amy Mckay is a 88 y.o. female with a history of COPD, chronic respiratory failure on oxygen, mesenteric ischemia status post SMA stenting, chronic hyponatremia, CVA, chronic rectal prolapse, iron deficiency anemia secondary to chronic blood loss, hypothyroidism, GERD, chronic dysphagia/esophageal dysmotility, chronic pain syndrome.  Patient presented secondary to rectal pain in setting of known chronic rectal prolapse.  CT imaging confirmed evidence of proctitis.  Patient made n.p.o. and [facility] consulted for evaluation/management.   Assessment and Plan:  Proctitis Insetting of chronic rectal prolapse.  CT imaging obtained on admission and was significant for wall thickening and mucosal enhancement involving the rectum. GI consulted with plan for flexible sigmoidoscopy. -Continue analgesics  Chronic hyponatremia Unlikely acute but difficult to ascertain.  Baseline sodium is fluctuates around 133 - 135.  Sodium of 128 on admission.  Asymptomatic.  Patient started on IV fluids. -Continue IV fluids  COPD Stable.  No evidence of exacerbation at this time. -Continue Breo Ellipta and albuterol  Chronic respiratory failure with hypoxia Patient is on 3 L/min of oxygen via nasal cannula as an outpatient.  Currently stable.  History of CVA Patient is managed on aspirin and Brilinta (secondary to history of SMA stent) in addition to Lipitor as an outpatient. -Continue aspirin and Lipitor  History of mesenteric ischemia status post SMA stent -Continue aspirin and Brilinta  Dysphagia Esophageal dysmotility Chronic issue.  Patient follows with [facility] as an outpatient.  Hypothyroidism -Continue Synthroid  Chronic pain syndrome -Continue oxycodone and gabapentin once able to take p.o. -Morphine as needed while n.p.o.  Iron deficiency anemia  secondary to chronic blood loss Hemoglobin stable.  Restless leg syndrome -Continue pramipexole  PAD S/p bilateral groin stents per patient.  Memory impairment -Continue Namenda  Aortic atherosclerosis Noted on CT scan.  DVT prophylaxis: SCDs Code Status:   Code Status: Full Code Family Communication: Daughter on telephone (25 minutes) Disposition Plan: Discharge pending GI recommendations/management   Consultants:  [facility]  Procedures:  None  Antimicrobials: None   Subjective: Patient reports pain of rectum, although symptoms have now improved with morphine.  Objective: BP (!) 139/107 (BP Location: Left Arm)   Pulse (!) 104   Temp 99 F (37.2 C) (Oral)   Resp 18   Ht 5\' 1"  (1.549 m)   Wt 55 kg   SpO2 98%   BMI 22.91 kg/m   Examination:  General exam: Appears calm and comfortable Respiratory system: Clear to auscultation. Respiratory effort normal. Cardiovascular system: S1 & S2 heard, RRR. Gastrointestinal system: Abdomen is nondistended, soft and nontender. Normal bowel sounds heard. Central nervous system: Alert and oriented.   Data Reviewed: I have personally reviewed following labs and imaging studies  CBC Lab Results  Component Value Date   WBC 8.5 01/29/2023   RBC 4.23 01/29/2023   HGB 11.2 (L) 01/29/2023   HCT 36.3 01/29/2023   MCV 85.8 01/29/2023   MCH 26.5 01/29/2023   PLT 428 (H) 01/29/2023   MCHC 30.9 01/29/2023   RDW 17.4 (H) 01/29/2023   LYMPHSABS 1.3 01/28/2023   MONOABS 1.3 (H) 01/28/2023   EOSABS 0.5 01/28/2023   BASOSABS 0.0 01/28/2023     Last metabolic panel Lab Results  Component Value Date   NA 129 (L) 01/29/2023   K 4.0 01/29/2023   CL 89 (L) 01/29/2023   CO2 28 01/29/2023   BUN 12  01/29/2023   CREATININE 0.55 01/29/2023   GLUCOSE 92 01/29/2023   GFRNONAA >60 01/29/2023   GFRAA >60 06/14/2016   CALCIUM 9.0 01/29/2023   PHOS 4.0 11/08/2022   PROT 7.2 01/28/2023   ALBUMIN 3.3 (L) 01/28/2023    BILITOT 0.2 (L) 01/28/2023   ALKPHOS 87 01/28/2023   AST 18 01/28/2023   ALT 15 01/28/2023   ANIONGAP 12 01/29/2023    GFR: Estimated Creatinine Clearance: 38.1 mL/min (by C-G formula based on SCr of 0.55 mg/dL).  Recent Results (from the past 240 hour(s))  MRSA Next Gen by PCR, Nasal     Status: None   Collection Time: 01/28/23 10:13 PM   Specimen: Nasal Mucosa; Nasal Swab  Result Value Ref Range Status   MRSA by PCR Next Gen NOT DETECTED NOT DETECTED Final    Comment: (NOTE) The GeneXpert MRSA Assay (FDA approved for NASAL specimens only), is one component of a comprehensive MRSA colonization surveillance program. It is not intended to diagnose MRSA infection nor to guide or monitor treatment for MRSA infections. Test performance is not FDA approved in patients less than 17 years old. Performed at [facility], [address], [place], [place] [address]       Radiology Studies: CT ABDOMEN PELVIS W CONTRAST  Result Date: 01/28/2023 CLINICAL DATA:  Lower abdominal pain.  Hypotension and rectal pain. EXAM: CT ABDOMEN AND PELVIS WITH CONTRAST TECHNIQUE: Multidetector CT imaging of the abdomen and pelvis was performed using the standard protocol following bolus administration of intravenous contrast. RADIATION DOSE REDUCTION: This exam was performed according to the departmental dose-optimization program which includes automated exposure control, adjustment of the mA and/or kV according to patient size and/or use of iterative reconstruction technique. CONTRAST:  OMNIPAQUE IOHEXOL 300 MG/ML  SOLN COMPARISON:  None Available. FINDINGS: Lower chest: 6 mm subpleural nodule in the periphery of the right lower lobe, image 10/4. unchanged compared with 01/19/2021. This is compatible with a benign nodule require no further follow-up. Subsegmental atelectasis versus scarring is identified within both lung bases, right greater than left. No pleural effusion identified.  Hepatobiliary: No suspicious liver abnormality. Mild gallbladder wall thickening measures up to 4 mm, image 37/2. Similar to 5/20 1/24. No gallstones identified. Unchanged increased caliber of the common bile duct which measures up to 9 mm, image 40/5. This is similar in appearance when compared with MRI from 10/22/2015. Cystic lesion within the uncinate process of the pancreas is stable measuring 9 mm when compared with the MR from 2017. Similar appearance of increased caliber of the main duct at the level of the head and neck of pancreas which measures up to 4 mm, image 36/2. Gallbladder wall enhancement and mild thickening measures Pancreas: Unremarkable. No pancreatic ductal dilatation or surrounding inflammatory changes. Spleen: Normal in size without focal abnormality. Adrenals/Urinary Tract: Normal adrenal glands. Bilateral renal cortical scarring. No nephrolithiasis, hydronephrosis, or suspicious mass. Moderate distension of the urinary bladder without focal abnormality Stomach/Bowel: Stomach appears normal. No pathologic dilatation of the large or small bowel loops. Sigmoid diverticulosis without signs of acute diverticulitis. Wall thickening and mucosal enhancement involving the rectum is identified. Again seen are signs of pelvic floor laxity with there is been interval reduction of previous rectal prolapse noted on the exam from 11/03/2022. Vascular/Lymphatic: Aortic atherosclerosis. No aneurysm. No sign of abdominopelvic adenopathy. Reproductive: Status post hysterectomy. No adnexal masses. Other: No free fluid or fluid collections. No signs of pneumoperitoneum Musculoskeletal: Curvature of the lumbar spine is convex towards  the left. Multi level degenerative disc disease identified. No acute or suspicious osseous abnormality. IMPRESSION: 1. Wall thickening and mucosal enhancement involving the rectum is identified. Findings are suggestive of proctitis. 2. Again seen are signs of pelvic floor laxity  with interval reduction of previous rectal prolapse noted on the exam from 11/03/2022. 3. Sigmoid diverticulosis without signs of acute diverticulitis. 4. Stable appearance of increased caliber of the common bile duct and main pancreatic duct when compared with MRI from 10/22/2015. 5. Stable cystic lesion within the uncinate process of the pancreas measuring 9 mm when compared with the MR from 2017. 6. 6 mm subpleural nodule in the periphery of the right lower lobe. Unchanged from 01/19/2021 compatible with a benign nodule. 7.  Aortic Atherosclerosis (ICD10-I70.0). Electronically Signed   By: Signa Kell M.D.   On: 01/28/2023 17:01      LOS: 0 days    Jacquelin Hawking, MD [facility] 01/29/2023, 8:09 AM   If 7PM-7AM, please contact night-coverage [email or web address]

## 2023-01-29 NOTE — Brief Op Note (Signed)
01/29/2023  4:14 PM  PATIENT:  Amy Mckay  88 y.o. female  PRE-OPERATIVE DIAGNOSIS:  Proctitis; Abnormal imaging  POST-OPERATIVE DIAGNOSIS:  Rectal prolapse; Rectal bx evaluate for inflammation  PROCEDURE:  Procedure(s): FLEXIBLE SIGMOIDOSCOPY (N/A) BIOPSY  SURGEON:  Surgeons and Role:    * Charlott Rakes, MD - Primary  See procedure report for complete details/recs. Reducible rectal prolapse and that is the tissue that is inflamed. Biopsies of rectum taken and will f/u on biopsies as outpt otherwise f/u with [place] prn. Updated daughter, [name], by phone. [facility] will sign off.

## 2023-01-29 NOTE — H&P (View-Only) (Signed)
Referring Provider: Dr. Caleb Popp Primary Care Physician:  Annita Brod, MD Primary Gastroenterologist:  Dr. Dulce Sellar  Reason for Consultation:  Proctitis; Abnormal CT   HPI: Amy Mckay is a 88 y.o. female with multiple medical problems and known history of rectal prolapse that is being evaluated by surgery in early September seen for a consult due to CT showing proctitis. Patient reports rectal bleeding at her [facility] but she is not able to tell me the details of the bleeding. Denies diarrhea. Colonoscopy X 2 in May 2023 and rectal biopsies were negative for active inflammation. Small tubular adenoma removed from proximal colon. Spoke to daughter, [name] by phone who states that the prolapse has been evaluated by 2 GI docs and she has an appt with [facility] in September.  Past Medical History:  Diagnosis Date   Acid reflux    COPD (chronic obstructive pulmonary disease) (HCC)    Hypertension     Past Surgical History:  Procedure Laterality Date   BIOPSY  10/17/2021   Procedure: BIOPSY;  Surgeon: Charlott Rakes, MD;  Location: [facility];  Service: Gastroenterology;;   COLONOSCOPY N/A 10/17/2021   Procedure: COLONOSCOPY;  Surgeon: Charlott Rakes, MD;  Location: [facility];  Service: Gastroenterology;  Laterality: N/A;   COLONOSCOPY WITH PROPOFOL N/A 10/18/2021   Procedure: COLONOSCOPY WITH PROPOFOL;  Surgeon: Kathi Der, MD;  Location: [facility];  Service: Gastroenterology;  Laterality: N/A;   ESOPHAGOGASTRODUODENOSCOPY N/A 10/17/2021   Procedure: ESOPHAGOGASTRODUODENOSCOPY (EGD);  Surgeon: Charlott Rakes, MD;  Location: [facility];  Service: Gastroenterology;  Laterality: N/A;   POLYPECTOMY  10/18/2021   Procedure: POLYPECTOMY;  Surgeon: Kathi Der, MD;  Location: [facility];  Service: Gastroenterology;;    Prior to Admission medications   Medication Sig Start Date End Date Taking? Authorizing Provider  albuterol (VENTOLIN HFA) 108 (90 Base) MCG/ACT inhaler Inhale  1-2 puffs into the lungs every 6 (six) hours as needed for wheezing or shortness of breath.    [provider]  aspirin EC 81 MG tablet Take 1 tablet (81 mg total) by mouth daily. Hold for 1 week. May resume 10/26/21 if remains stable with worsening anemia/GI bleeds 10/18/21   Alessandra Bevels, MD  atorvastatin (LIPITOR) 40 MG tablet Take 1 tablet (40 mg total) by mouth daily. 11/06/22   Almon Hercules, MD  BREO ELLIPTA 200-25 MCG/ACT AEPB Inhale 1 puff into the lungs daily. 11/03/22   [provider]  Calcium Carb-Cholecalciferol (CALCIUM + VITAMIN D3 PO) Take 1 tablet by mouth daily.    [provider]  gabapentin (NEURONTIN) 800 MG tablet Take 800 mg by mouth 3 (three) times daily. 04/25/16   [provider]  hydrocortisone (ANUSOL-HC) 25 MG suppository Place 1 suppository (25 mg total) rectally 2 (two) times daily. Patient not taking: Reported on 11/03/2022 10/18/21   Alessandra Bevels, MD  levothyroxine (SYNTHROID, LEVOTHROID) 75 MCG tablet Take 75 mcg by mouth daily before breakfast. 02/05/16   [provider]  memantine (NAMENDA) 10 MG tablet Take 10 mg by mouth 2 (two) times daily.    [provider]  Multiple Vitamin (MULTIVITAMIN WITH MINERALS) TABS tablet Take 1 tablet by mouth daily with breakfast.    [provider]  pantoprazole (PROTONIX) 40 MG tablet Take 1 tablet (40 mg total) by mouth 2 (two) times daily. 11/12/22 11/12/23  Kathlen Mody, MD  polyethylene glycol (MIRALAX / GLYCOLAX) 17 g packet Take 17 g by mouth 2 (two) times daily as needed for mild constipation. 11/06/22   Alanda Slim,  Boyce Medici, MD  pramipexole (MIRAPEX) 0.25 MG tablet Take 0.25 mg by mouth 3 (three) times daily. 01/02/21   [provider]  senna-docusate (SENOKOT-S) 8.6-50 MG tablet Take 1 tablet by mouth 2 (two) times daily as needed for moderate constipation. 11/06/22   Almon Hercules, MD  ticagrelor (BRILINTA) 90 MG TABS tablet Take 1 tablet (90 mg total) by  mouth 2 (two) times daily. 11/06/22   Almon Hercules, MD  vitamin C (ASCORBIC ACID) 500 MG tablet Take 500 mg by mouth daily.    [provider]    Scheduled Meds:  aspirin EC  81 mg Oral Daily   atorvastatin  40 mg Oral Daily   fluticasone furoate-vilanterol  1 puff Inhalation Daily   gabapentin  800 mg Oral TID   levothyroxine  75 mcg Oral Q0600   memantine  10 mg Oral BID   pantoprazole  40 mg Oral BID AC   pramipexole  0.25 mg Oral TID   ticagrelor  90 mg Oral BID   Continuous Infusions:  sodium chloride 100 mL/hr at 01/29/23 0817   PRN Meds:.acetaminophen **OR** acetaminophen, albuterol, morphine injection, ondansetron **OR** ondansetron (ZOFRAN) IV, oxyCODONE  Allergies as of 01/28/2023 - Review Complete 01/28/2023  Allergen Reaction Noted   Penicillins Anaphylaxis, Swelling, and Other (See Comments) 05/14/2016   Penicillin g Hives 11/03/2022    No family history on file.  Social History   Socioeconomic History   Marital status: Single    Spouse name: Not on file   Number of children: Not on file   Years of education: Not on file   Highest education level: Not on file  Occupational History   Not on file  Tobacco Use   Smoking status: Every Day    Current packs/day: 1.50    Types: Cigarettes   Smokeless tobacco: Never  Vaping Use   Vaping status: Never Used  Substance and Sexual Activity   Alcohol use: No   Drug use: No   Sexual activity: Not on file  Other Topics Concern   Not on file  Social History Narrative   Not on file   Social Determinants of Health   Financial Resource Strain: Not on file  Food Insecurity: No Food Insecurity (01/28/2023)   Hunger Vital Sign    Worried About Running Out of Food in the Last Year: Never true    Ran Out of Food in the Last Year: Never true  Transportation Needs: No Transportation Needs (01/28/2023)   PRAPARE - Administrator, Civil Service (Medical): No    Lack of Transportation (Non-Medical):  No  Physical Activity: Not on file  Stress: Not on file  Social Connections: Not on file  Intimate Partner Violence: Not At Risk (01/28/2023)   Humiliation, Afraid, Rape, and Kick questionnaire    Fear of Current or Ex-Partner: No    Emotionally Abused: No    Physically Abused: No    Sexually Abused: No    Review of Systems: All negative except as stated above in HPI.  Physical Exam: Vital signs: Vitals:   01/29/23 0738 01/29/23 0835  BP:  (!) 140/95  Pulse:  98  Resp:  16  Temp:  98.5 F (36.9 C)  SpO2: 98% 96%   Last BM Date : 01/25/23 General:   chronically ill-appearing, elderly, thin, no acute distress  Head: normocephalic, atraumatic Eyes: anicteric sclera ENT: oropharynx clear Neck: supple, nontender Lungs:  Clear throughout to auscultation.   No wheezes,  crackles, or rhonchi. No acute distress. Heart:  Regular rate and rhythm; no murmurs, clicks, rubs,  or gallops. Abdomen: soft, nontender, nondistended, +BS  Rectal:  Deferred Ext: no edema  GI:  Lab Results: Recent Labs    01/28/23 1309 01/29/23 0509  WBC 9.6 8.5  HGB 10.6* 11.2*  HCT 34.1* 36.3  PLT 410* 428*   BMET Recent Labs    01/28/23 1309 01/29/23 0509  NA 128* 129*  K 4.6 4.0  CL 90* 89*  CO2 26 28  GLUCOSE 93 92  BUN 19 12  CREATININE 0.90 0.55  CALCIUM 8.8* 9.0   LFT Recent Labs    01/28/23 1309  PROT 7.2  ALBUMIN 3.3*  AST 18  ALT 15  ALKPHOS 87  BILITOT 0.2*   PT/INR No results for input(s): "LABPROT", "INR" in the last 72 hours.   Studies/Results: CT ABDOMEN PELVIS W CONTRAST  Result Date: 01/28/2023 CLINICAL DATA:  Lower abdominal pain.  Hypotension and rectal pain. EXAM: CT ABDOMEN AND PELVIS WITH CONTRAST TECHNIQUE: Multidetector CT imaging of the abdomen and pelvis was performed using the standard protocol following bolus administration of intravenous contrast. RADIATION DOSE REDUCTION: This exam was performed according to the departmental dose-optimization  program which includes automated exposure control, adjustment of the mA and/or kV according to patient size and/or use of iterative reconstruction technique. CONTRAST:  OMNIPAQUE IOHEXOL 300 MG/ML  SOLN COMPARISON:  None Available. FINDINGS: Lower chest: 6 mm subpleural nodule in the periphery of the right lower lobe, image 10/4. unchanged compared with 01/19/2021. This is compatible with a benign nodule require no further follow-up. Subsegmental atelectasis versus scarring is identified within both lung bases, right greater than left. No pleural effusion identified. Hepatobiliary: No suspicious liver abnormality. Mild gallbladder wall thickening measures up to 4 mm, image 37/2. Similar to 5/20 1/24. No gallstones identified. Unchanged increased caliber of the common bile duct which measures up to 9 mm, image 40/5. This is similar in appearance when compared with MRI from 10/22/2015. Cystic lesion within the uncinate process of the pancreas is stable measuring 9 mm when compared with the MR from 2017. Similar appearance of increased caliber of the main duct at the level of the head and neck of pancreas which measures up to 4 mm, image 36/2. Gallbladder wall enhancement and mild thickening measures Pancreas: Unremarkable. No pancreatic ductal dilatation or surrounding inflammatory changes. Spleen: Normal in size without focal abnormality. Adrenals/Urinary Tract: Normal adrenal glands. Bilateral renal cortical scarring. No nephrolithiasis, hydronephrosis, or suspicious mass. Moderate distension of the urinary bladder without focal abnormality Stomach/Bowel: Stomach appears normal. No pathologic dilatation of the large or small bowel loops. Sigmoid diverticulosis without signs of acute diverticulitis. Wall thickening and mucosal enhancement involving the rectum is identified. Again seen are signs of pelvic floor laxity with there is been interval reduction of previous rectal prolapse noted on the exam from  11/03/2022. Vascular/Lymphatic: Aortic atherosclerosis. No aneurysm. No sign of abdominopelvic adenopathy. Reproductive: Status post hysterectomy. No adnexal masses. Other: No free fluid or fluid collections. No signs of pneumoperitoneum Musculoskeletal: Curvature of the lumbar spine is convex towards the left. Multi level degenerative disc disease identified. No acute or suspicious osseous abnormality. IMPRESSION: 1. Wall thickening and mucosal enhancement involving the rectum is identified. Findings are suggestive of proctitis. 2. Again seen are signs of pelvic floor laxity with interval reduction of previous rectal prolapse noted on the exam from 11/03/2022. 3. Sigmoid diverticulosis without signs of acute diverticulitis. 4. Stable appearance of  increased caliber of the common bile duct and main pancreatic duct when compared with MRI from 10/22/2015. 5. Stable cystic lesion within the uncinate process of the pancreas measuring 9 mm when compared with the MR from 2017. 6. 6 mm subpleural nodule in the periphery of the right lower lobe. Unchanged from 01/19/2021 compatible with a benign nodule. 7.  Aortic Atherosclerosis (ICD10-I70.0). Electronically Signed   By: Signa Kell M.D.   On: 01/28/2023 17:01    Impression/Plan: Rectal prolapse with rectal inflammation on CT scan that is likely reactive changes from the rectal prolapse. Since has been over a year since last colonoscopy, will do a flex sig to further evaluate the inflammation noted on CT scan. NPO for flex sig to be done today.    LOS: 0 days   Shirley Friar  01/29/2023, 10:42 AM  Questions please call [phone]

## 2023-01-29 NOTE — Interval H&P Note (Signed)
History and Physical Interval Note:  01/29/2023 2:54 PM  Amy Mckay  has presented today for surgery, with the diagnosis of Proctitis; Abnormal imaging.  The various methods of treatment have been discussed with the patient and family. After consideration of risks, benefits and other options for treatment, the patient has consented to  Procedure(s): FLEXIBLE SIGMOIDOSCOPY (N/A) as a surgical intervention.  The patient's history has been reviewed, patient examined, no change in status, stable for surgery.  I have reviewed the patient's chart and labs.  Questions were answered to the patient's satisfaction.     Shirley Friar

## 2023-01-29 NOTE — Op Note (Signed)
[facility] Patient Name: Amy Mckay Procedure Date: 01/29/2023 MRN: [id number] Attending MD: Shirley Friar , MD, [id number] Date of Birth: February 25, 1935 CSN: [id number] Age: 88 Admit Type: Inpatient Procedure:                Flexible Sigmoidoscopy Indications:              Abnormal CT of the GI tract, Rectal pain Providers:                Shirley Friar, MD, Fransisca Connors, Salley Scarlet, Technician, Deri Fuelling, CRNA Referring MD:             hospital team Medicines:                Propofol per Anesthesia, Monitored Anesthesia Care Complications:            No immediate complications. Estimated Blood Loss:     Estimated blood loss was minimal. Procedure:                Pre-Anesthesia Assessment:                           - Prior to the procedure, a History and Physical                            was performed, and patient medications and                            allergies were reviewed. The patient's tolerance of                            previous anesthesia was also reviewed. The risks                            and benefits of the procedure and the sedation                            options and risks were discussed with the patient.                            All questions were answered, and informed consent                            was obtained. Prior Anticoagulants: The patient has                            taken Brilinta (ticagrelor), last dose was more                            than 4 weeks prior to procedure. ASA Grade                            Assessment: III - A patient with severe systemic  disease. After reviewing the risks and benefits,                            the patient was deemed in satisfactory condition to                            undergo the procedure.                           After obtaining informed consent, the scope was                            passed under  direct vision. The PCF-HQ190L                            ([id number] Olympus colonoscope was introduced                            through the anus and advanced to the the sigmoid                            colon. The flexible sigmoidoscopy was accomplished                            without difficulty. The patient tolerated the                            procedure well. The quality of the bowel                            preparation was an unprepped procedure. Scope In: 3:39:42 PM Scope Out: 3:46:29 PM Total Procedure Duration: 0 hours 6 minutes 47 seconds  Findings:      The perianal exam findings include large rectal prolapse (reducible).      A segmental area of moderately congested and erythematous mucosa was       found in the rectum. Biopsies were taken with a cold forceps for       histology. Estimated blood loss was minimal.      Solid stool was found in the sigmoid colon, interfering with       visualization. Impression:               - Large rectal prolapse (reducible) found on                            perianal exam.                           - Congested and erythematous mucosa in the rectum.                            Biopsied.                           - Stool in the sigmoid colon.                           -  Rectal erythema likely reactive from rectal                            prolapse. Moderate Sedation:      N/A - MAC procedure Recommendation:           - Await pathology results. Procedure Code(s):        --- Professional ---                           [phone], Sigmoidoscopy, flexible; with biopsy, single                            or multiple Diagnosis Code(s):        --- Professional ---                           R93.3, Abnormal findings on diagnostic imaging of                            other parts of digestive tract                           K62.89, Other specified diseases of anus and rectum CPT copyright 2022 American Medical Association. All rights  reserved. The codes documented in this report are preliminary and upon coder review Mckay  be revised to meet current compliance requirements. Shirley Friar, MD 01/29/2023 4:04:58 PM This report has been signed electronically. Number of Addenda: 0

## 2023-01-29 NOTE — Anesthesia Postprocedure Evaluation (Signed)
Anesthesia Post Note  Patient: [name]  Procedure(s) Performed: FLEXIBLE SIGMOIDOSCOPY BIOPSY     Patient location during evaluation: Endoscopy Anesthesia Type: MAC Level of consciousness: awake and alert Pain management: pain level controlled Vital Signs Assessment: post-procedure vital signs reviewed and stable Respiratory status: spontaneous breathing, nonlabored ventilation, respiratory function stable and patient connected to nasal cannula oxygen Cardiovascular status: blood pressure returned to baseline and stable Postop Assessment: no apparent nausea or vomiting Anesthetic complications: no  No notable events documented.  Last Vitals:  Vitals:   01/29/23 1615 01/29/23 1647  BP: (!) 187/100 (!) 151/101  Pulse: 97 96  Resp: 13 16  Temp:  36.7 C  SpO2: 97%     Last Pain:  Vitals:   01/29/23 1647  TempSrc: Oral  PainSc:                  Amy Mckay L Amy Mckay

## 2023-01-29 NOTE — Transfer of Care (Signed)
Immediate Anesthesia Transfer of Care Note  Patient: Amy Mckay  Procedure(s) Performed: FLEXIBLE SIGMOIDOSCOPY BIOPSY  Patient Location: PACU  Anesthesia Type:MAC  Level of Consciousness: awake and alert   Airway & Oxygen Therapy: Patient Spontanous Breathing, Patient connected to nasal cannula oxygen, and Patient connected to face mask oxygen  Post-op Assessment: Report given to RN and Post -op Vital signs reviewed and stable  Post vital signs: Reviewed and stable  Last Vitals:  Vitals Value Taken Time  BP 135/85 01/29/23 1555  Temp 36.6 C 01/29/23 1555  Pulse 93 01/29/23 1600  Resp 14 01/29/23 1600  SpO2 99 % 01/29/23 1600  Vitals shown include unfiled device data.  Last Pain:  Vitals:   01/29/23 1555  TempSrc: Temporal  PainSc: 6       Patients Stated Pain Goal: 2 (01/29/23 0817)  Complications: No notable events documented.

## 2023-01-29 NOTE — Plan of Care (Signed)

## 2023-01-29 NOTE — TOC Initial Note (Addendum)
Transition of Care [facility]) - Initial/Assessment Note    Patient Details  Name: Amy Mckay MRN: [id number] Date of Birth: [birth year]  [facility]) CM/SW Contact:    Otelia Santee, LCSW Phone Number: 01/29/2023, 2:45 PM  Clinical Narrative:                 Pt coming from [facility] where she is a LTC resident. Pt was at [facility] under her [facility] however, since Medicare coverage ran out was transitioned to LTC bed under Medicaid.  Pt shares she has a RW and wheelchair at the facility she uses for ambulating and shares she is typically independent with ADL's.  Pt plans to return to [facility] at discharge. Have contacted [facility] to confirm plan and currently waiting on response.   Expected Discharge Plan: Skilled Nursing Facility Barriers to Discharge: No Barriers Identified   Patient Goals and CMS Choice Patient states their goals for this hospitalization and ongoing recovery are:: To return to [facility].gov Compare Post Acute Care list provided to:: Patient Choice offered to / list presented to : Patient [place] ownership interest in [facility].provided to:: Patient    Expected Discharge Plan and Services In-house Referral: NA Discharge Planning Services: NA Post Acute Care Choice: Resumption of Svcs/PTA Provider, Nursing Home, Skilled Nursing Facility Living arrangements for the past 2 months: Skilled Nursing Facility                 DME Arranged: N/A DME Agency: NA                  Prior Living Arrangements/Services Living arrangements for the past 2 months: Skilled Nursing Facility Lives with:: Facility Resident Patient language and need for interpreter reviewed:: Yes Do you feel safe going back to the place where you live?: Yes      Need for Family Participation in Patient Care: No (Comment) Care giver support system in place?: Yes (comment) Current home services: DME (wheelchair and RW) Criminal  Activity/Legal Involvement Pertinent to Current Situation/Hospitalization: No - Comment as needed  Activities of Daily Living Home Assistive Devices/Equipment: Environmental consultant (specify type), Wheelchair ADL Screening (condition at time of admission) Patient's cognitive ability adequate to safely complete daily activities?: Yes Is the patient deaf or have difficulty hearing?: Yes Does the patient have difficulty seeing, even when wearing glasses/contacts?: No Does the patient have difficulty concentrating, remembering, or making decisions?: No Patient able to express need for assistance with ADLs?: Yes Does the patient have difficulty dressing or bathing?: Yes Independently performs ADLs?: No Communication: Independent Dressing (OT): Needs assistance Is this a change from baseline?: Pre-admission baseline Grooming: Needs assistance Is this a change from baseline?: Pre-admission baseline Feeding: Independent Bathing: Needs assistance Is this a change from baseline?: Pre-admission baseline Toileting: Needs assistance Is this a change from baseline?: Pre-admission baseline In/Out Bed: Needs assistance Is this a change from baseline?: Pre-admission baseline Walks in Home: Needs assistance Is this a change from baseline?: Pre-admission baseline Does the patient have difficulty walking or climbing stairs?: No Weakness of Legs: None Weakness of Arms/Hands: None  Permission Sought/Granted Permission sought to share information with : Facility Medical sales representative, Family Supports Permission granted to share information with : Yes, Verbal Permission Granted  Share Information with NAME: Curlene Labrum  Permission granted to share info w AGENCY: [facility]  Permission granted to share info w Relationship: Daughter  Permission granted to share info w Contact Information: [phone]  Emotional Assessment Appearance:: Appears stated age Attitude/Demeanor/Rapport: Engaged Affect (typically  observed): Accepting, Pleasant Orientation: : Oriented to Self, Oriented to Place, Oriented to  Time, Oriented to Situation Alcohol / Substance Use: Not Applicable Psych Involvement: No (comment)  Admission diagnosis:  Rectal pain [K62.89] Proctitis [K62.89] Patient Active Problem List   Diagnosis Date Noted   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/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   History of CVA (cerebrovascular accident) 11/03/2022   Heme positive stool 10/17/2021   Esophageal dysphagia 10/14/2021   Acute toxic encephalopathy 10/14/2021   Orthostatic hypotension 10/14/2021   Near syncope 10/13/2021   Iron deficiency anemia due to chronic blood loss 10/13/2021   Chronic respiratory failure with hypoxia (HCC) 10/13/2021   Chronic lower back pain 10/13/2021   Tobacco use disorder 10/13/2021   Pulmonary nodules/lesions, multiple 10/13/2021   Acute on chronic respiratory failure with hypoxia (HCC) 02/02/2021   COVID-19 virus infection 02/01/2021   AKI (acute kidney injury) (HCC) 02/01/2021   Peripheral arterial disease (HCC) 02/01/2021   Benign essential HTN 02/01/2021   COPD (chronic obstructive pulmonary disease) (HCC) 02/01/2021   Cigarette smoker 02/01/2021   Hypothyroidism 02/01/2021   Memory impairment 02/01/2021   Restless leg 02/01/2021   Chronic hyponatremia 02/01/2021   Colitis 01/29/2021   PCP:  Annita Brod, MD Pharmacy:   [facility] - [place], [place] - [address] [place] [address] Phone: [phone] Fax: [phone]     Social Determinants of Health (SDOH) Social History: SDOH Screenings   Food Insecurity: No Food Insecurity (01/28/2023)  Housing: Low Risk  (01/28/2023)  Transportation Needs: No Transportation Needs (01/28/2023)  Utilities: Not At Risk (01/28/2023)  Tobacco Use: High Risk (11/03/2022)   SDOH Interventions:     Readmission Risk Interventions     No data to display

## 2023-01-30 DIAGNOSIS — K6289 Other specified diseases of anus and rectum: Secondary | ICD-10-CM | POA: Diagnosis not present

## 2023-01-30 LAB — BASIC METABOLIC PANEL
Anion gap: 12 (ref 5–15)
BUN: 11 mg/dL (ref 8–23)
CO2: 27 mmol/L (ref 22–32)
Calcium: 8.7 mg/dL — ABNORMAL LOW (ref 8.9–10.3)
Chloride: 95 mmol/L — ABNORMAL LOW (ref 98–111)
Creatinine, Ser: 0.67 mg/dL (ref 0.44–1.00)
GFR, Estimated: >60 mL/min (ref >60)
Glucose, Bld: 90 mg/dL (ref 70–99)
Potassium: 3.5 mmol/L (ref 3.5–5.1)
Sodium: 134 mmol/L — ABNORMAL LOW (ref 135–145)

## 2023-01-30 MED ORDER — OXYCODONE HCL 5 MG PO TABS: 10.0000 mg | ORAL_TABLET | Freq: Four times a day (QID) | ORAL | Status: DC | PRN

## 2023-01-30 MED ORDER — OXYCODONE HCL 15 MG PO TABS: 15.0000 mg | ORAL_TABLET | Freq: Every day | ORAL | 0 refills | Status: DC | PRN

## 2023-01-30 MED ORDER — PRAMIPEXOLE DIHYDROCHLORIDE 0.25 MG PO TABS: 0.2500 mg | ORAL_TABLET | Freq: Two times a day (BID) | ORAL | Status: DC

## 2023-01-30 MED ORDER — OXYCODONE HCL 10 MG PO TABS: 10.0000 mg | ORAL_TABLET | Freq: Four times a day (QID) | ORAL | 0 refills | Status: DC | PRN

## 2023-01-30 MED ORDER — OXYCODONE HCL 5 MG PO TABS
15.0000 mg | ORAL_TABLET | Freq: Every day | ORAL | Status: DC | PRN
  Administered 2023-01-30: 15 mg via ORAL
  Filled 2023-01-30: qty 3

## 2023-01-30 MED ORDER — GABAPENTIN 600 MG PO TABS: 300.0000 mg | ORAL_TABLET | Freq: Three times a day (TID) | ORAL | Status: DC

## 2023-01-30 NOTE — Discharge Summary (Signed)
Physician Discharge Summary   Patient: Amy Mckay MRN: [id number] DOB: [birth year]  Admit date:     01/28/2023  Discharge date: 01/30/23  Discharge Physician: Jacquelin Hawking, MD   PCP: Annita Brod, MD   Recommendations at discharge:  PCP follow-up General surgery follow-up for evaluation/management of rectal prolapse Rectal biopsy  Discharge Diagnoses: Principal Problem:   Proctitis Active Problems:   Iron deficiency anemia due to chronic blood loss   Esophageal dysphagia   Chronic lower back pain   COPD (chronic obstructive pulmonary disease) (HCC)   Chronic respiratory failure with hypoxia (HCC)   Peripheral arterial disease (HCC)   Hypothyroidism   Memory impairment   Chronic hyponatremia   History of CVA (cerebrovascular accident)  Resolved Problems:   * No resolved hospital problems. *  Hospital Course: Amy Mckay is a 88 y.o. female with a history of COPD, chronic respiratory failure on oxygen, mesenteric ischemia status post SMA stenting, chronic hyponatremia, CVA, chronic rectal prolapse, iron deficiency anemia secondary to chronic blood loss, hypothyroidism, GERD, chronic dysphagia/esophageal dysmotility, chronic pain syndrome.  Patient presented secondary to rectal pain in setting of known chronic rectal prolapse.  CT imaging confirmed evidence of proctitis.  Patient made n.p.o. and [facility] consulted for evaluation/management.  Flexible sigmoidoscopy performed on 8/16 and was significant for reducible rectal prolapse with inflammatory changes consistent with a reactivity from rectal prolapse; biopsy obtained.  Patient return to home pain regimen with control of rectal pain.  Assessment and Plan:  Proctitis Insetting of chronic rectal prolapse.  CT imaging obtained on admission and was significant for wall thickening and mucosal enhancement involving the rectum. GI consulted and performed a flexible sigmoidoscopy which confirms likely reactive  inflammation from rectal prolapse; biopsy obtained.   Chronic hyponatremia Unlikely acute but difficult to ascertain.  Baseline sodium is fluctuates around 133 - 135.  Sodium of 128 on admission.  Asymptomatic.  Patient started on IV fluids. Sodium improved to 134 prior to discharge.   COPD Stable.  No evidence of exacerbation at this time. Continue Breo Ellipta and albuterol.   Chronic respiratory failure with hypoxia Patient is on 3 L/min of oxygen via nasal cannula as an outpatient.  Currently stable.   History of CVA Patient is managed on aspirin and Brilinta (secondary to history of SMA stent) in addition to Lipitor as an outpatient. Continue aspirin, Brilinta and Lipitor per neurology recommendations from May 2024.   History of mesenteric ischemia status post SMA stent Continue aspirin and Brilinta   Dysphagia Esophageal dysmotility Chronic issue.  Patient follows with [facility] as an outpatient.   Hypothyroidism Continue Synthroid   Chronic pain syndrome Continue oxycodone and gabapentin. Gabapentin renally dosed.   Iron deficiency anemia secondary to chronic blood loss Hemoglobin stable.   Restless leg syndrome Continue pramipexole, renally dosed.   PAD S/p bilateral groin stents per patient.   Memory impairment Continue Namenda   Aortic atherosclerosis Noted on CT scan. Continue Lipitor   Consultants:  [facility]   Procedures:  8/16: Flexible sigmoidoscopy   Antimicrobials: None  Disposition: Skilled nursing facility Diet recommendation: Cardiac diet   DISCHARGE MEDICATION: Allergies as of 01/30/2023       Reactions   Penicillins Anaphylaxis, Swelling, Other (See Comments)   Penicillin G Hives        Medication List     TAKE these medications    acetaminophen 500 MG tablet Commonly known as: TYLENOL Take 500 mg by mouth in the morning and at  bedtime.   albuterol 108 (90 Base) MCG/ACT inhaler Commonly known as: VENTOLIN  HFA Inhale 1-2 puffs into the lungs every 6 (six) hours as needed for wheezing or shortness of breath.   ascorbic acid 500 MG tablet Commonly known as: VITAMIN C Take 500 mg by mouth daily.   aspirin EC 81 MG tablet Take 1 tablet (81 mg total) by mouth daily. Hold for 1 week. May resume 10/26/21 if remains stable with worsening anemia/GI bleeds What changed: additional instructions   atorvastatin 40 MG tablet Commonly known as: LIPITOR Take 1 tablet (40 mg total) by mouth daily.   Breo Ellipta 200-25 MCG/ACT Aepb Generic drug: fluticasone furoate-vilanterol Inhale 1 puff into the lungs daily.   CALCIUM + VITAMIN D3 PO Take 1 tablet by mouth daily.   ferrous gluconate 324 MG tablet Commonly known as: FERGON Take 324 mg by mouth daily with breakfast.   fluticasone 50 MCG/ACT nasal spray Commonly known as: FLONASE Place 1 spray into both nostrils daily.   gabapentin 600 MG tablet Commonly known as: NEURONTIN Take 0.5 tablets (300 mg total) by mouth 3 (three) times daily. What changed:  medication strength how much to take   HM Lidocaine Patch 4 % Generic drug: lidocaine Place 1 patch onto the skin daily.   hydrocortisone 25 MG suppository Commonly known as: ANUSOL-HC Place 1 suppository (25 mg total) rectally 2 (two) times daily.   Ipratropium-Albuterol 20-100 MCG/ACT Aers respimat Commonly known as: COMBIVENT Inhale 1 puff into the lungs 2 (two) times daily.   levothyroxine 75 MCG tablet Commonly known as: SYNTHROID Take 75 mcg by mouth daily before breakfast.   memantine 10 MG tablet Commonly known as: NAMENDA Take 10 mg by mouth 2 (two) times daily.   multivitamin with minerals Tabs tablet Take 1 tablet by mouth daily with breakfast.   oxyCODONE 15 MG immediate release tablet Commonly known as: ROXICODONE Take 1 tablet (15 mg total) by mouth daily as needed (Breakthrough pain). What changed: reasons to take this   Oxycodone HCl 10 MG Tabs Take 1 tablet  (10 mg total) by mouth 4 (four) times daily as needed (Severe pain). What changed: reasons to take this   pantoprazole 40 MG tablet Commonly known as: Protonix Take 1 tablet (40 mg total) by mouth 2 (two) times daily.   polyethylene glycol 17 g packet Commonly known as: MIRALAX / GLYCOLAX Take 17 g by mouth 2 (two) times daily as needed for mild constipation.   pramipexole 0.25 MG tablet Commonly known as: MIRAPEX Take 1 tablet (0.25 mg total) by mouth 2 (two) times daily. What changed: when to take this   PSYLLIUM HUSK PO Take 0.4 g by mouth in the morning and at bedtime.   senna-docusate 8.6-50 MG tablet Commonly known as: Senokot-S Take 1 tablet by mouth 2 (two) times daily as needed for moderate constipation.   ticagrelor 90 MG Tabs tablet Commonly known as: BRILINTA Take 1 tablet (90 mg total) by mouth 2 (two) times daily.   tiZANidine 4 MG tablet Commonly known as: ZANAFLEX Take 4 mg by mouth every 6 (six) hours as needed for muscle spasms.        Discharge Exam: BP (!) 141/101 (BP Location: Right Arm)   Pulse 95   Temp 98 F (36.7 C) (Oral)   Resp 14   Ht 5\' 1"  (1.549 m)   Wt 55 kg   SpO2 93%   BMI 22.91 kg/m   General exam: Appears calm and comfortable  Respiratory system: Clear to auscultation. Respiratory effort normal. Central nervous system: Alert   Condition at discharge: stable  The results of significant diagnostics from this hospitalization (including imaging, microbiology, ancillary and laboratory) are listed below for reference.   Imaging Studies: CT ABDOMEN PELVIS W CONTRAST  Result Date: 01/28/2023 CLINICAL DATA:  Lower abdominal pain.  Hypotension and rectal pain. EXAM: CT ABDOMEN AND PELVIS WITH CONTRAST TECHNIQUE: Multidetector CT imaging of the abdomen and pelvis was performed using the standard protocol following bolus administration of intravenous contrast. RADIATION DOSE REDUCTION: This exam was performed according to the  departmental dose-optimization program which includes automated exposure control, adjustment of the mA and/or kV according to patient size and/or use of iterative reconstruction technique. CONTRAST:  OMNIPAQUE IOHEXOL 300 MG/ML  SOLN COMPARISON:  None Available. FINDINGS: Lower chest: 6 mm subpleural nodule in the periphery of the right lower lobe, image 10/4. unchanged compared with 01/19/2021. This is compatible with a benign nodule require no further follow-up. Subsegmental atelectasis versus scarring is identified within both lung bases, right greater than left. No pleural effusion identified. Hepatobiliary: No suspicious liver abnormality. Mild gallbladder wall thickening measures up to 4 mm, image 37/2. Similar to 5/20 1/24. No gallstones identified. Unchanged increased caliber of the common bile duct which measures up to 9 mm, image 40/5. This is similar in appearance when compared with MRI from 10/22/2015. Cystic lesion within the uncinate process of the pancreas is stable measuring 9 mm when compared with the MR from 2017. Similar appearance of increased caliber of the main duct at the level of the head and neck of pancreas which measures up to 4 mm, image 36/2. Gallbladder wall enhancement and mild thickening measures Pancreas: Unremarkable. No pancreatic ductal dilatation or surrounding inflammatory changes. Spleen: Normal in size without focal abnormality. Adrenals/Urinary Tract: Normal adrenal glands. Bilateral renal cortical scarring. No nephrolithiasis, hydronephrosis, or suspicious mass. Moderate distension of the urinary bladder without focal abnormality Stomach/Bowel: Stomach appears normal. No pathologic dilatation of the large or small bowel loops. Sigmoid diverticulosis without signs of acute diverticulitis. Wall thickening and mucosal enhancement involving the rectum is identified. Again seen are signs of pelvic floor laxity with there is been interval reduction of previous rectal  prolapse noted on the exam from 11/03/2022. Vascular/Lymphatic: Aortic atherosclerosis. No aneurysm. No sign of abdominopelvic adenopathy. Reproductive: Status post hysterectomy. No adnexal masses. Other: No free fluid or fluid collections. No signs of pneumoperitoneum Musculoskeletal: Curvature of the lumbar spine is convex towards the left. Multi level degenerative disc disease identified. No acute or suspicious osseous abnormality. IMPRESSION: 1. Wall thickening and mucosal enhancement involving the rectum is identified. Findings are suggestive of proctitis. 2. Again seen are signs of pelvic floor laxity with interval reduction of previous rectal prolapse noted on the exam from 11/03/2022. 3. Sigmoid diverticulosis without signs of acute diverticulitis. 4. Stable appearance of increased caliber of the common bile duct and main pancreatic duct when compared with MRI from 10/22/2015. 5. Stable cystic lesion within the uncinate process of the pancreas measuring 9 mm when compared with the MR from 2017. 6. 6 mm subpleural nodule in the periphery of the right lower lobe. Unchanged from 01/19/2021 compatible with a benign nodule. 7.  Aortic Atherosclerosis (ICD10-I70.0). Electronically Signed   By: Signa Kell M.D.   On: 01/28/2023 17:01    Microbiology: Results for orders placed or performed during the hospital encounter of 01/28/23  MRSA Next Gen by PCR, Nasal     Status: None  Collection Time: 01/28/23 10:13 PM   Specimen: Nasal Mucosa; Nasal Swab  Result Value Ref Range Status   MRSA by PCR Next Gen NOT DETECTED NOT DETECTED Final    Comment: (NOTE) The GeneXpert MRSA Assay (FDA approved for NASAL specimens only), is one component of a comprehensive MRSA colonization surveillance program. It is not intended to diagnose MRSA infection nor to guide or monitor treatment for MRSA infections. Test performance is not FDA approved in patients less than 20 years old. Performed at [facility], [address] [place], [place] [address]     Labs: CBC: Recent Labs  Lab 01/28/23 1309 01/29/23 0509  WBC 9.6 8.5  NEUTROABS 6.4  --   HGB 10.6* 11.2*  HCT 34.1* 36.3  MCV 86.3 85.8  PLT 410* 428*   Basic Metabolic Panel: Recent Labs  Lab 01/28/23 1309 01/29/23 0509 01/30/23 0626  NA 128* 129* 134*  K 4.6 4.0 3.5  CL 90* 89* 95*  CO2 26 28 27   GLUCOSE 93 92 90  BUN 19 12 11   CREATININE 0.90 0.55 0.67  CALCIUM 8.8* 9.0 8.7*   Liver Function Tests: Recent Labs  Lab 01/28/23 1309  AST 18  ALT 15  ALKPHOS 87  BILITOT 0.2*  PROT 7.2  ALBUMIN 3.3*    Discharge time spent: 35 minutes.  Signed: Jacquelin Hawking, MD [facility] 01/30/2023

## 2023-01-30 NOTE — Plan of Care (Signed)
  Problem: Education: Goal: Knowledge of General Education information will improve Description: Including pain rating scale, medication(s)/side effects and non-pharmacologic comfort measures Outcome: Not Progressing   Problem: Health Behavior/Discharge Planning: Goal: Ability to manage health-related needs will improve Outcome: Not Progressing   Problem: Clinical Measurements: Goal: Ability to maintain clinical measurements within normal limits will improve Outcome: Not Progressing Goal: Will remain free from infection Outcome: Not Progressing Goal: Diagnostic test results will improve Outcome: Not Progressing Goal: Respiratory complications will improve Outcome: Not Progressing Goal: Cardiovascular complication will be avoided Outcome: Not Progressing   Problem: Activity: Goal: Risk for activity intolerance will decrease Outcome: Not Progressing   Problem: Nutrition: Goal: Adequate nutrition will be maintained Outcome: Not Progressing   Problem: Coping: Goal: Level of anxiety will decrease Outcome: Not Progressing   Problem: Pain Managment: Goal: General experience of comfort will improve Outcome: Not Progressing   Problem: Safety: Goal: Ability to remain free from injury will improve Outcome: Not Progressing

## 2023-01-30 NOTE — TOC Transition Note (Signed)
Transition of Care [facility]) - CM/SW Discharge Note   Patient Details  Name: Tika Broadley MRN: [id number] Date of Birth: [birth year]  Transition of Care [facility]) CM/SW Contact:  Georgie Chard, LCSW Phone Number: 01/30/2023, 2:11 PM   Clinical Narrative:    Patient can return to [facility]. Patients room number is 304 a report number is [phone]. This CSW has called [facility] to arrange transport. AT this time nurse is aware. There are no further TOC needs at this time.    Final next level of care: Skilled Nursing Facility Barriers to Discharge: No Barriers Identified   Patient Goals and CMS Choice CMS Medicare.gov Compare Post Acute Care list provided to:: Patient Choice offered to / list presented to : Patient  Discharge Placement                         Discharge Plan and Services Additional resources added to the After Visit Summary for   In-house Referral: NA Discharge Planning Services: NA Post Acute Care Choice: Resumption of Svcs/PTA Provider, Nursing Home, Skilled Nursing Facility          DME Arranged: N/A DME Agency: NA                  Social Determinants of Health (SDOH) Interventions SDOH Screenings   Food Insecurity: No Food Insecurity (01/28/2023)  Housing: Low Risk  (01/28/2023)  Transportation Needs: No Transportation Needs (01/28/2023)  Utilities: Not At Risk (01/28/2023)  Tobacco Use: High Risk (01/29/2023)     Readmission Risk Interventions     No data to display

## 2023-01-30 NOTE — Plan of Care (Signed)
  Problem: Education: Goal: Knowledge of General Education information will improve Description: Including pain rating scale, medication(s)/side effects and non-pharmacologic comfort measures 01/30/2023 1548 by Julio Alm, RN Outcome: Adequate for Discharge 01/30/2023 1528 by Julio Alm, RN Outcome: Progressing   Problem: Health Behavior/Discharge Planning: Goal: Ability to manage health-related needs will improve 01/30/2023 1548 by Julio Alm, RN Outcome: Adequate for Discharge 01/30/2023 1528 by Julio Alm, RN Outcome: Progressing   Problem: Clinical Measurements: Goal: Ability to maintain clinical measurements within normal limits will improve 01/30/2023 1548 by Julio Alm, RN Outcome: Adequate for Discharge 01/30/2023 1528 by Julio Alm, RN Outcome: Progressing Goal: Will remain free from infection 01/30/2023 1548 by Julio Alm, RN Outcome: Adequate for Discharge 01/30/2023 1528 by Julio Alm, RN Outcome: Progressing Goal: Diagnostic test results will improve Outcome: Adequate for Discharge Goal: Respiratory complications will improve Outcome: Adequate for Discharge Goal: Cardiovascular complication will be avoided Outcome: Adequate for Discharge   Problem: Activity: Goal: Risk for activity intolerance will decrease 01/30/2023 1548 by Julio Alm, RN Outcome: Adequate for Discharge 01/30/2023 1528 by Julio Alm, RN Outcome: Progressing   Problem: Nutrition: Goal: Adequate nutrition will be maintained 01/30/2023 1548 by Julio Alm, RN Outcome: Adequate for Discharge 01/30/2023 1528 by Julio Alm, RN Outcome: Progressing   Problem: Coping: Goal: Level of anxiety will decrease 01/30/2023 1548 by Julio Alm, RN Outcome: Adequate for Discharge 01/30/2023 1528 by Julio Alm, RN Outcome: Progressing   Problem: Elimination: Goal: Will not experience complications related to bowel  motility 01/30/2023 1548 by Julio Alm, RN Outcome: Adequate for Discharge 01/30/2023 1528 by Julio Alm, RN Outcome: Progressing Goal: Will not experience complications related to urinary retention 01/30/2023 1548 by Julio Alm, RN Outcome: Adequate for Discharge 01/30/2023 1528 by Julio Alm, RN Outcome: Progressing   Problem: Pain Managment: Goal: General experience of comfort will improve 01/30/2023 1548 by Julio Alm, RN Outcome: Adequate for Discharge 01/30/2023 1528 by Julio Alm, RN Outcome: Progressing   Problem: Safety: Goal: Ability to remain free from injury will improve 01/30/2023 1548 by Julio Alm, RN Outcome: Adequate for Discharge 01/30/2023 1528 by Julio Alm, RN Outcome: Progressing   Problem: Skin Integrity: Goal: Risk for impaired skin integrity will decrease 01/30/2023 1548 by Julio Alm, RN Outcome: Adequate for Discharge 01/30/2023 1528 by Julio Alm, RN Outcome: Progressing

## 2023-01-30 NOTE — Discharge Instructions (Signed)
Amy Mckay,

## 2023-01-30 NOTE — Progress Notes (Signed)
Report called to [facility] and patients daughter, [name] notified that patient left with [facility] at 1610. All of the patients belongings were given to [facility] including: clothes, shoes, and hearing aids. Patients dentures were with her as well. Discharge packet given to [facility].

## 2023-01-30 NOTE — Plan of Care (Signed)

## 2023-02-01 ENCOUNTER — Encounter (HOSPITAL_COMMUNITY): Payer: Self-pay | Admitting: Gastroenterology

## 2023-02-03 ENCOUNTER — Inpatient Hospital Stay: Payer: 59 | Admitting: Neurology

## 2023-02-03 LAB — SURGICAL PATHOLOGY

## 2023-04-15 ENCOUNTER — Other Ambulatory Visit: Payer: Self-pay | Admitting: Surgery

## 2023-04-15 DIAGNOSIS — N6314 Unspecified lump in the right breast, lower inner quadrant: Secondary | ICD-10-CM

## 2023-04-26 ENCOUNTER — Encounter: Payer: Self-pay | Admitting: Neurology

## 2023-04-26 ENCOUNTER — Ambulatory Visit (INDEPENDENT_AMBULATORY_CARE_PROVIDER_SITE_OTHER): Payer: 59 | Admitting: Neurology

## 2023-04-26 VITALS — BP 139/86 | HR 92 | Ht 63.0 in | Wt 132.0 lb

## 2023-04-26 DIAGNOSIS — G309 Alzheimer's disease, unspecified: Secondary | ICD-10-CM

## 2023-04-26 DIAGNOSIS — R29898 Other symptoms and signs involving the musculoskeletal system: Secondary | ICD-10-CM | POA: Diagnosis not present

## 2023-04-26 DIAGNOSIS — F0150 Vascular dementia without behavioral disturbance: Secondary | ICD-10-CM | POA: Diagnosis not present

## 2023-04-26 DIAGNOSIS — I6381 Other cerebral infarction due to occlusion or stenosis of small artery: Secondary | ICD-10-CM

## 2023-04-26 DIAGNOSIS — F0280 Dementia in other diseases classified elsewhere without behavioral disturbance: Secondary | ICD-10-CM

## 2023-04-26 NOTE — Patient Instructions (Signed)
I had a long d/w patient about her recent lacunar stroke, right leg weakness and mild dementia, risk for recurrent stroke/TIAs, personally independently reviewed imaging studies and stroke evaluation results and answered questions.Continue aspirin 81 mg daily and Brilinta (ticagrelor) 90 mg bid  for secondary stroke prevention and maintain strict control of hypertension with blood pressure goal below 130/90, diabetes with hemoglobin A1c goal below 6.5% and lipids with LDL cholesterol goal below 70 mg/dL. I also advised the patient to eat a healthy diet with plenty of whole grains, cereals, fruits and vegetables, exercise regularly and maintain ideal body weight .continue ongoing physical and occupational therapy.  Continue Namenda for her memory loss and dementia which appears stable.  She was advised to continue to ambulate using a wheelchair and we discussed fall safety precautions.  Followup in the future with me in future only as needed and no scheduled appointment was made. Fall Prevention in Hospitals, Adult Staying in the hospital puts you at risk of falling. Falls can cause serious injuries, but they can be prevented. Make sure you know what puts you at risk for falling and what you and your health care team can do to prevent falls. If you or a loved one falls in the hospital, tell the hospital staff about it. What can increase my risk of falls? Factors that increase your risk of falling in the hospital include: Being in an unfamiliar environment, especially when using the bathroom at night. Having surgery or being on bed rest. Taking many medicines or certain types of medicines, such as sleeping pills. Some medicines can cause confusion, trouble with balance, dizziness, or low blood pressure. Having tubes in place, such as IVs or catheters. Other risk factors for falls while in the hospital include: Having trouble with hearing or vision. Having depression. Needing to use the toilet  frequently. Having fallen during the past 3 months. What actions can I take to prevent falls? If you or a loved one has to stay in the hospital: Ask about which fall prevention strategies will be in place. Do not get up by yourself if you have been asked to call for help when getting up. Asking for help to get up is for your safety, and the staff is there to help you. Wear non-skid shoes or non-skid slippers. Get up slowly, and sit at the side of the bed for a few minutes before standing up. Keep items you need close to you, such as the call button or a phone, so that you do not need to reach for them. Wear eyeglasses or hearing aids as told by your health care provider. Have someone stay in the hospital with you or your loved one. Ask if sleeping pills or other medicines that can cause confusion or dizziness are necessary if they are prescribed to you or a loved one. What does the hospital staff do to help prevent falls?     Hospitals have systems in place to prevent falls and accidents, which may include: Discussing your fall risk and making a personalized fall prevention plan. Checking in regularly to see if you need help. Some hospitals use video monitoring that allows a staff member to come to you if you need help. Placing an armband on your wrist or a sign near your room to alert other staff of your needs. Using an alarm on your hospital bed. This is an alarm that goes off if you get out of bed and forget to call for help. Keeping the bed  in a low and locked position. Keeping the area around the bed and bathroom well-lit and not cluttered. Having a staff person stay with you (one-on-one observation), even when you are using the bathroom. This is for your safety. Using safety equipment, such as: A belt around your waist. Walkers, crutches, and other devices for support. Safety beds, such as low beds, or cushions on the floor next to the bed. What other actions can I take to prevent  falls? Check in regularly with your provider or pharmacist to review all medicines that you take. Make sure that you have a regular exercise program to stay physically fit. This will help you maintain your balance. Talk with a physical therapist if recommended by your provider. A physical therapist can help you learn to do exercises to improve movement and strength. If you are over 22 years old: Ask your provider if you need a calcium or vitamin D supplement. Have your eyes and hearing checked every year. Have your feet checked every year. This information is not intended to replace advice given to you by your health care provider. Make sure you discuss any questions you have with your health care provider. Document Revised: 02/02/2022 Document Reviewed: 02/02/2022 Elsevier Patient Education  2024 [employer].

## 2023-04-26 NOTE — Progress Notes (Signed)
[facility] [address] [place]. [place] [address] [phone]       OFFICE FOLLOW-UP NOTE  Amy Mckay Date of Birth:  1934-09-06 Medical Record Number:  [id number]   HPI: Amy Mckay is a 88 year old pleasant African-American lady seen today for initial office follow-up visit following hospital admission for stroke in May 2024.  History is obtained from her and review of electronic medical records and I personally reviewed pertinent available imaging films in PACS. She has past medical history of COPD on home oxygen, hypertension, peripheral arterial disease, gastroesophageal reflux disease.  She presented on 11/03/2022 with a 2-day history of right-sided weakness.  She was outside the window for thrombolysis.  MRI scan showed a small left basal ganglia lacunar infarct.  CT angiogram showed 45 to 50% left ICA proximal stenosis.  2D echo showed ejection fraction of 60 to 65%.  LDL cholesterol was elevated 134 mg percent and hemoglobin A1c 6.3.  Patient was started on dual antiplatelet therapy aspirin and Plavix for 3 weeks and then aspirin alone.  She was seen by physical Occupational Therapy recommended home therapies.  Patient does have baseline cognitive impairment and mild dementia and is on Namenda.  She states her right-sided weakness is improving since discharge but she still has some mild right leg weakness.  She is able to walk without wheelchair but she has to push it.  Prior to that she was using a walker and a cane.  She is getting home physical Occupational Therapy.  She states she is continues to have memory and cognitive difficulties but these appear unchanged from prior to her stroke.  She has been taking Namenda for several years and feels memory impairment is stable.  She has had no recurrent stroke or TIA symptoms. ROS:   14 system review of systems is positive for weakness, difficulty walking, shortness of breath, memory loss all other systems  negative  PMH:  Past Medical History:  Diagnosis Date   Acid reflux    COPD (chronic obstructive pulmonary disease) (HCC)    Hypertension     Social History:  Social History   Socioeconomic History   Marital status: Single    Spouse name: Not on file   Number of children: Not on file   Years of education: Not on file   Highest education level: Not on file  Occupational History   Not on file  Tobacco Use   Smoking status: Every Day    Current packs/day: 1.50    Types: Cigarettes   Smokeless tobacco: Never  Vaping Use   Vaping status: Never Used  Substance and Sexual Activity   Alcohol use: No   Drug use: No   Sexual activity: Not on file  Other Topics Concern   Not on file  Social History Narrative   Not on file   Social Determinants of Health   Financial Resource Strain: Not on file  Food Insecurity: No Food Insecurity (01/28/2023)   Hunger Vital Sign    Worried About Running Out of Food in the Last Year: Never true    Ran Out of Food in the Last Year: Never true  Transportation Needs: No Transportation Needs (01/28/2023)   PRAPARE - Administrator, Civil Service (Medical): No    Lack of Transportation (Non-Medical): No  Physical Activity: Not on file  Stress: Not on file  Social Connections: Not on file  Intimate Partner Violence: Not At Risk (01/28/2023)   Humiliation, Afraid, Rape,  and Kick questionnaire    Fear of Current or Ex-Partner: No    Emotionally Abused: No    Physically Abused: No    Sexually Abused: No    Medications:   Current Outpatient Medications on File Prior to Visit  Medication Sig Dispense Refill   acetaminophen (TYLENOL) 500 MG tablet Take 500 mg by mouth in the morning and at bedtime.     aspirin EC 81 MG tablet Take 1 tablet (81 mg total) by mouth daily. Hold for 1 week. May resume 10/26/21 if remains stable with worsening anemia/GI bleeds (Patient taking differently: Take 81 mg by mouth daily.) 30 tablet 11    atorvastatin (LIPITOR) 40 MG tablet Take 1 tablet (40 mg total) by mouth daily.     BREO ELLIPTA 200-25 MCG/ACT AEPB Inhale 1 puff into the lungs daily.     Calcium Carb-Cholecalciferol (CALCIUM + VITAMIN D3 PO) Take 1 tablet by mouth daily.     ferrous gluconate (FERGON) 324 MG tablet Take 324 mg by mouth daily with breakfast.     fluticasone (FLONASE) 50 MCG/ACT nasal spray Place 1 spray into both nostrils daily.     gabapentin (NEURONTIN) 600 MG tablet Take 0.5 tablets (300 mg total) by mouth 3 (three) times daily.     hydrocortisone (ANUSOL-HC) 25 MG suppository Place 1 suppository (25 mg total) rectally 2 (two) times daily. 12 suppository 0   Ipratropium-Albuterol (COMBIVENT) 20-100 MCG/ACT AERS respimat Inhale 1 puff into the lungs 2 (two) times daily.     levothyroxine (SYNTHROID, LEVOTHROID) 75 MCG tablet Take 75 mcg by mouth daily before breakfast.     lidocaine (HM LIDOCAINE PATCH) 4 % Place 1 patch onto the skin daily.     memantine (NAMENDA) 10 MG tablet Take 10 mg by mouth 2 (two) times daily.     Multiple Vitamin (MULTIVITAMIN WITH MINERALS) TABS tablet Take 1 tablet by mouth daily with breakfast.     oxyCODONE (ROXICODONE) 15 MG immediate release tablet Take 1 tablet (15 mg total) by mouth daily as needed (Breakthrough pain). 3 tablet 0   Oxycodone HCl 10 MG TABS Take 1 tablet (10 mg total) by mouth 4 (four) times daily as needed (Severe pain). 12 tablet 0   pantoprazole (PROTONIX) 40 MG tablet Take 1 tablet (40 mg total) by mouth 2 (two) times daily. 60 tablet 11   polyethylene glycol (MIRALAX / GLYCOLAX) 17 g packet Take 17 g by mouth 2 (two) times daily as needed for mild constipation. 14 each 0   pramipexole (MIRAPEX) 0.25 MG tablet Take 1 tablet (0.25 mg total) by mouth 2 (two) times daily.     senna-docusate (SENOKOT-S) 8.6-50 MG tablet Take 1 tablet by mouth 2 (two) times daily as needed for moderate constipation.     ticagrelor (BRILINTA) 90 MG TABS tablet Take 1 tablet (90  mg total) by mouth 2 (two) times daily. 60 tablet    tiZANidine (ZANAFLEX) 4 MG tablet Take 4 mg by mouth every 6 (six) hours as needed for muscle spasms.     vitamin C (ASCORBIC ACID) 500 MG tablet Take 500 mg by mouth daily.     albuterol (VENTOLIN HFA) 108 (90 Base) MCG/ACT inhaler Inhale 1-2 puffs into the lungs every 6 (six) hours as needed for wheezing or shortness of breath. (Patient not taking: Reported on 01/29/2023)     PSYLLIUM HUSK PO Take 0.4 g by mouth in the morning and at bedtime. (Patient not taking: Reported on 04/26/2023)  No current facility-administered medications on file prior to visit.    Allergies:   Allergies  Allergen Reactions   Penicillins Anaphylaxis, Swelling and Other (See Comments)   Penicillin G Hives    Physical Exam General: Frail elderly African-American lady.  Seated, in no evident distress.  She is on home oxygen Head: head normocephalic and atraumatic.  Neck: supple with no carotid or supraclavicular bruits Cardiovascular: regular rate and rhythm, no murmurs Musculoskeletal: no deformity Skin:  no rash/petichiae Vascular:  Normal pulses all extremities Vitals:   04/26/23 0918  BP: 139/86  Pulse: 92   Neurologic Exam Mental Status: Awake and fully alert. Oriented to place and time. Recent and remote memory diminished attention span, concentration and fund of knowledge diminished mood and affect appropriate.  Cranial Nerves: Fundoscopic exam reveals sharp disc margins. Pupils equal, briskly reactive to light. Extraocular movements full without nystagmus. Visual fields full to confrontation. Hearing intact. Facial sensation intact. Face, tongue, palate moves normally and symmetrically.  Motor: Normal bulk and tone. Normal strength in all tested extremity muscles.  Except diminished fine finger movements on the right.  Orbits left or right upper extremity.  Mild weakness of right hip flexors 4/5 and right ankle dorsiflexors 3/5. Sensory.:   Diminished touch ,pinprick .position and vibratory sensation.  Right leg from the knee down and left leg from ankle down Coordination: Rapid alternating movements normal in all extremities. Finger-to-nose and heel-to-shin performed accurately bilaterally. Gait and Station: A deferred as patient is in a wheelchair not able to walk well. Reflexes: 1+ and symmetric. Toes downgoing.   NIHSS  2 Modified Rankin  3     04/26/2023    9:37 AM  Montreal Cognitive Assessment   Visuospatial/ Executive (0/5) 4  Naming (0/3) 3  Attention: Read list of digits (0/2) 2  Attention: Read list of letters (0/1) 1  Attention: Serial 7 subtraction starting at 100 (0/3) 1  Language: Repeat phrase (0/2) 2  Language : Fluency (0/1) 0  Abstraction (0/2) 2  Delayed Recall (0/5) 5  Orientation (0/6) 6  Total 26    ASSESSMENT: 88 year old African-American lady with left basal ganglia infarct due to small vessel disease in May 2024 with residual right leg weakness.  She also has memory difficulties due to mild dementia.  Vascular risk factors of hyperlipidemia and peripheral arterial disease status post stenting.     PLAN:I had a long d/w patient about her recent lacunar stroke, right leg weakness and mild dementia, risk for recurrent stroke/TIAs, personally independently reviewed imaging studies and stroke evaluation results and answered questions.Continue aspirin 81 mg daily and Brilinta (ticagrelor) 90 mg bid  for her peripheral arterial disease and secondary stroke prevention and maintain strict control of hypertension with blood pressure goal below 130/90, diabetes with hemoglobin A1c goal below 6.5% and lipids with LDL cholesterol goal below 70 mg/dL. I also advised the patient to eat a healthy diet with plenty of whole grains, cereals, fruits and vegetables, exercise regularly and maintain ideal body weight .continue ongoing physical and occupational therapy.  Continue Namenda for her memory loss and dementia  which appears stable.  She was advised to continue to ambulate using a wheelchair and we discussed fall safety precautions.  Followup in the future with me in future only as needed and no scheduled appointment was made. Greater than 50% of time during this 40 minute prolonged visit was spent on counseling,explanation of diagnosis, planning of further management, discussion with patient and family and coordination of care  Delia Heady, MD Note: This document was prepared with digital dictation and possible smart phrase technology. Any transcriptional errors that result from this process are unintentional

## 2023-06-12 ENCOUNTER — Inpatient Hospital Stay (HOSPITAL_COMMUNITY)
Admission: EM | Admit: 2023-06-12 | Discharge: 2023-06-17 | DRG: 280 | Disposition: A | Discharge status: Skilled Nursing Facility | Payer: 59 | Attending: Internal Medicine | Admitting: Internal Medicine

## 2023-06-12 ENCOUNTER — Encounter (HOSPITAL_COMMUNITY): Payer: Self-pay | Admitting: Emergency Medicine

## 2023-06-12 ENCOUNTER — Other Ambulatory Visit: Payer: Self-pay

## 2023-06-12 ENCOUNTER — Emergency Department (HOSPITAL_COMMUNITY): Payer: 59

## 2023-06-12 DIAGNOSIS — I959 Hypotension, unspecified: Secondary | ICD-10-CM

## 2023-06-12 DIAGNOSIS — E876 Hypokalemia: Secondary | ICD-10-CM | POA: Diagnosis present

## 2023-06-12 DIAGNOSIS — J9621 Acute and chronic respiratory failure with hypoxia: Secondary | ICD-10-CM | POA: Diagnosis present

## 2023-06-12 DIAGNOSIS — G894 Chronic pain syndrome: Secondary | ICD-10-CM | POA: Diagnosis present

## 2023-06-12 DIAGNOSIS — K623 Rectal prolapse: Secondary | ICD-10-CM | POA: Diagnosis present

## 2023-06-12 DIAGNOSIS — J439 Emphysema, unspecified: Secondary | ICD-10-CM | POA: Diagnosis present

## 2023-06-12 DIAGNOSIS — I110 Hypertensive heart disease with heart failure: Secondary | ICD-10-CM | POA: Diagnosis not present

## 2023-06-12 DIAGNOSIS — D62 Acute posthemorrhagic anemia: Secondary | ICD-10-CM | POA: Diagnosis present

## 2023-06-12 DIAGNOSIS — R4189 Other symptoms and signs involving cognitive functions and awareness: Secondary | ICD-10-CM | POA: Diagnosis present

## 2023-06-12 DIAGNOSIS — Z9981 Dependence on supplemental oxygen: Secondary | ICD-10-CM

## 2023-06-12 DIAGNOSIS — I5023 Acute on chronic systolic (congestive) heart failure: Secondary | ICD-10-CM | POA: Diagnosis present

## 2023-06-12 DIAGNOSIS — Z7982 Long term (current) use of aspirin: Secondary | ICD-10-CM

## 2023-06-12 DIAGNOSIS — Z8673 Personal history of transient ischemic attack (TIA), and cerebral infarction without residual deficits: Secondary | ICD-10-CM

## 2023-06-12 DIAGNOSIS — J189 Pneumonia, unspecified organism: Secondary | ICD-10-CM | POA: Diagnosis present

## 2023-06-12 DIAGNOSIS — Z7951 Long term (current) use of inhaled steroids: Secondary | ICD-10-CM

## 2023-06-12 DIAGNOSIS — J9601 Acute respiratory failure with hypoxia: Principal | ICD-10-CM

## 2023-06-12 DIAGNOSIS — D649 Anemia, unspecified: Secondary | ICD-10-CM

## 2023-06-12 DIAGNOSIS — Y831 Surgical operation with implant of artificial internal device as the cause of abnormal reaction of the patient, or of later complication, without mention of misadventure at the time of the procedure: Secondary | ICD-10-CM | POA: Diagnosis present

## 2023-06-12 DIAGNOSIS — Z79890 Hormone replacement therapy (postmenopausal): Secondary | ICD-10-CM

## 2023-06-12 DIAGNOSIS — D638 Anemia in other chronic diseases classified elsewhere: Secondary | ICD-10-CM | POA: Diagnosis present

## 2023-06-12 DIAGNOSIS — F03911 Unspecified dementia, unspecified severity, with agitation: Secondary | ICD-10-CM | POA: Diagnosis present

## 2023-06-12 DIAGNOSIS — R739 Hyperglycemia, unspecified: Secondary | ICD-10-CM | POA: Diagnosis present

## 2023-06-12 DIAGNOSIS — Z880 Allergy status to penicillin: Secondary | ICD-10-CM

## 2023-06-12 DIAGNOSIS — F17210 Nicotine dependence, cigarettes, uncomplicated: Secondary | ICD-10-CM | POA: Diagnosis present

## 2023-06-12 DIAGNOSIS — M549 Dorsalgia, unspecified: Secondary | ICD-10-CM | POA: Diagnosis present

## 2023-06-12 DIAGNOSIS — Z66 Do not resuscitate: Secondary | ICD-10-CM | POA: Diagnosis present

## 2023-06-12 DIAGNOSIS — R571 Hypovolemic shock: Secondary | ICD-10-CM | POA: Diagnosis present

## 2023-06-12 DIAGNOSIS — I21A1 Myocardial infarction type 2: Secondary | ICD-10-CM | POA: Diagnosis present

## 2023-06-12 DIAGNOSIS — T82856A Stenosis of peripheral vascular stent, initial encounter: Secondary | ICD-10-CM | POA: Diagnosis present

## 2023-06-12 DIAGNOSIS — E871 Hypo-osmolality and hyponatremia: Secondary | ICD-10-CM | POA: Diagnosis present

## 2023-06-12 DIAGNOSIS — R911 Solitary pulmonary nodule: Secondary | ICD-10-CM | POA: Diagnosis present

## 2023-06-12 DIAGNOSIS — F32A Depression, unspecified: Secondary | ICD-10-CM | POA: Diagnosis present

## 2023-06-12 DIAGNOSIS — J9600 Acute respiratory failure, unspecified whether with hypoxia or hypercapnia: Secondary | ICD-10-CM | POA: Diagnosis not present

## 2023-06-12 DIAGNOSIS — Z1152 Encounter for screening for COVID-19: Secondary | ICD-10-CM

## 2023-06-12 DIAGNOSIS — Z79899 Other long term (current) drug therapy: Secondary | ICD-10-CM

## 2023-06-12 DIAGNOSIS — G2581 Restless legs syndrome: Secondary | ICD-10-CM | POA: Diagnosis present

## 2023-06-12 DIAGNOSIS — I214 Non-ST elevation (NSTEMI) myocardial infarction: Secondary | ICD-10-CM | POA: Diagnosis present

## 2023-06-12 DIAGNOSIS — I700 Atherosclerosis of aorta: Secondary | ICD-10-CM | POA: Diagnosis present

## 2023-06-12 DIAGNOSIS — E039 Hypothyroidism, unspecified: Secondary | ICD-10-CM | POA: Diagnosis present

## 2023-06-12 DIAGNOSIS — J9622 Acute and chronic respiratory failure with hypercapnia: Secondary | ICD-10-CM | POA: Diagnosis present

## 2023-06-12 DIAGNOSIS — K219 Gastro-esophageal reflux disease without esophagitis: Secondary | ICD-10-CM | POA: Diagnosis present

## 2023-06-12 DIAGNOSIS — E878 Other disorders of electrolyte and fluid balance, not elsewhere classified: Secondary | ICD-10-CM | POA: Diagnosis present

## 2023-06-12 DIAGNOSIS — Z7902 Long term (current) use of antithrombotics/antiplatelets: Secondary | ICD-10-CM

## 2023-06-12 LAB — I-STAT VENOUS BLOOD GAS, ED
Acid-Base Excess: 3 mmol/L — ABNORMAL HIGH (ref 0.0–2.0)
Bicarbonate: 30 mmol/L — ABNORMAL HIGH (ref 20.0–28.0)
Calcium, Ion: 1.06 mmol/L — ABNORMAL LOW (ref 1.15–1.40)
HCT: 23 % — ABNORMAL LOW (ref 36.0–46.0)
Hemoglobin: 7.8 g/dL — ABNORMAL LOW (ref 12.0–15.0)
O2 Saturation: 57 %
Potassium: 3.8 mmol/L (ref 3.5–5.1)
Sodium: 134 mmol/L — ABNORMAL LOW (ref 135–145)
TCO2: 32 mmol/L (ref 22–32)
pCO2, Ven: 56.6 mm[Hg] (ref 44–60)
pH, Ven: 7.332 (ref 7.25–7.43)
pO2, Ven: 33 mm[Hg] (ref 32–45)

## 2023-06-12 LAB — PROTIME-INR
INR: 1 (ref 0.8–1.2)
Prothrombin Time: 13 s (ref 11.4–15.2)

## 2023-06-12 MED ORDER — FENTANYL CITRATE PF 50 MCG/ML IJ SOSY
50.0000 ug | PREFILLED_SYRINGE | Freq: Once | INTRAMUSCULAR | Status: AC
  Administered 2023-06-12: 50 ug via INTRAVENOUS
  Filled 2023-06-12: qty 1

## 2023-06-12 MED ORDER — IPRATROPIUM-ALBUTEROL 0.5-2.5 (3) MG/3ML IN SOLN
3.0000 mL | Freq: Once | RESPIRATORY_TRACT | Status: AC
  Administered 2023-06-12: 3 mL via RESPIRATORY_TRACT
  Filled 2023-06-12: qty 3

## 2023-06-12 NOTE — Progress Notes (Signed)
   06/12/23 2338  BiPAP/CPAP/SIPAP  $ Non-Invasive Ventilator  Non-Invasive Vent Set Up  $ Face Mask Medium Yes  BiPAP/CPAP/SIPAP Pt Type Adult  BiPAP/CPAP/SIPAP SERVO  Mask Type Full face mask  Mask Size Medium  Respiratory Rate 40 breaths/min  IPAP 12 cmH20  EPAP 6 cmH2O  PEEP 6 cmH20  FiO2 (%) 40 %  Minute Ventilation 8.9  Leak 55  Peak Inspiratory Pressure (PIP) 18  Tidal Volume (Vt) 466  Patient Home Equipment No  Auto Titrate No  Press High Alarm 30 cmH2O  BiPAP/CPAP /SiPAP Vitals  Temp 97.9 F (36.6 C)  Pulse Rate (!) 125  Resp 15  SpO2 100 %  MEWS Score/Color  MEWS Score 2  MEWS Score Color Yellow

## 2023-06-12 NOTE — ED Provider Notes (Signed)
[facility]-EMERGENCY DEPT [facility] Emergency Department Provider Note MRN:  [id number]  Arrival date & time: 06/12/23     Chief Complaint   Respiratory Distress   History of Present Illness   Amy Mckay is a 88 y.o. year-old female with a history of hypertension, COPD presenting to the ED with chief complaint of respiratory distress.  Sudden onset shortness of breath waking her from sleep, in distress with EMS, hypoxic, poor air movement.  Received Solu-Medrol, magnesium, EpiPen, breathing treatments.  Complaining of rectal pain, has a rectal prolapse.  Review of Systems  A thorough review of systems was obtained and all systems are negative except as noted in the HPI and PMH.   Patient's Health History    Past Medical History:  Diagnosis Date   Acid reflux    COPD (chronic obstructive pulmonary disease) (HCC)    Hypertension     Past Surgical History:  Procedure Laterality Date   BIOPSY  10/17/2021   Procedure: BIOPSY;  Surgeon: Charlott Rakes, MD;  Location: [facility];  Service: Gastroenterology;;   BIOPSY  01/29/2023   Procedure: BIOPSY;  Surgeon: Charlott Rakes, MD;  Location: [facility];  Service: Gastroenterology;;   COLONOSCOPY N/A 10/17/2021   Procedure: COLONOSCOPY;  Surgeon: Charlott Rakes, MD;  [facility]: [facility];  Service: Gastroenterology;  Laterality: N/A;   COLONOSCOPY WITH PROPOFOL N/A 10/18/2021   Procedure: COLONOSCOPY WITH PROPOFOL;  Surgeon: Kathi Der, MD;  [facility]: [facility];  Service: Gastroenterology;  Laterality: N/A;   ESOPHAGOGASTRODUODENOSCOPY N/A 10/17/2021   Procedure: ESOPHAGOGASTRODUODENOSCOPY (EGD);  Surgeon: Charlott Rakes, MD;  Location: [facility];  Service: Gastroenterology;  Laterality: N/A;   FLEXIBLE SIGMOIDOSCOPY N/A 01/29/2023   Procedure: FLEXIBLE SIGMOIDOSCOPY;  Surgeon: Charlott Rakes, MD;  [facility];  Service: Gastroenterology;  Laterality: N/A;   POLYPECTOMY  10/18/2021   Procedure:  POLYPECTOMY;  Surgeon: Kathi Der, MD;  Location: [facility];  Service: Gastroenterology;;    No family history on file.  Social History   Socioeconomic History   Marital status: Single    Spouse name: Not on file   Number of children: Not on file   Years of education: Not on file   Highest education level: Not on file  Occupational History   Not on file  Tobacco Use   Smoking status: Every Day    Current packs/day: 1.50    Types: Cigarettes   Smokeless tobacco: Never  Vaping Use   Vaping status: Never Used  Substance and Sexual Activity   Alcohol use: No   Drug use: No   Sexual activity: Not on file  Other Topics Concern   Not on file  Social History Narrative   Not on file   Social Drivers of Health   Financial Resource Strain: Not on file  Food Insecurity: No Food Insecurity (01/28/2023)   Hunger Vital Sign    Worried About Running Out of Food in the Last Year: Never true    Ran Out of Food in the Last Year: Never true  Transportation Needs: No Transportation Needs (01/28/2023)   PRAPARE - Administrator, Civil Service (Medical): No    Lack of Transportation (Non-Medical): No  Physical Activity: Not on file  Stress: Not on file  Social Connections: Not on file  Intimate Partner Violence: Not At Risk (01/28/2023)   Humiliation, Afraid, Rape, and Kick questionnaire    Fear of Current or Ex-Partner: No    Emotionally Abused: No    Physically Abused: No  Sexually Abused: No     Physical Exam  There were no vitals filed for this visit.  CONSTITUTIONAL: Ill-appearing, in severe respiratory distress NEURO/PSYCH:  Alert and oriented x 3, no focal deficits EYES:  eyes equal and reactive ENT/NECK:  no LAD, no JVD CARDIO: Tachycardic rate, well-perfused, normal S1 and S2 PULM:  CTAB no wheezing or rhonchi, increased work of breathing, tachypneic GI/GU:  non-distended, non-tender MSK/SPINE:  No gross deformities, no edema SKIN:  no rash,  atraumatic   *Additional and/or pertinent findings included in MDM below  Diagnostic and Interventional Summary    EKG Interpretation Date/Time:    Ventricular Rate:    PR Interval:    QRS Duration:    QT Interval:    QTC Calculation:   R Axis:      Text Interpretation:        *** Labs Reviewed  CBC  COMPREHENSIVE METABOLIC PANEL  BRAIN NATRIURETIC PEPTIDE  PROTIME-INR  TROPONIN I (HIGH SENSITIVITY)    DG Chest Port 1 View    (Results Pending)  CT Angio Chest Pulmonary Embolism (PE) W or WO Contrast    (Results Pending)    Medications  ipratropium-albuterol (DUONEB) 0.5-2.5 (3) MG/3ML nebulizer solution 3 mL (has no administration in time range)  ipratropium-albuterol (DUONEB) 0.5-2.5 (3) MG/3ML nebulizer solution 3 mL (has no administration in time range)  fentaNYL (SUBLIMAZE) injection 50 mcg (has no administration in time range)     Procedures  /  Critical Care .Critical Care  Performed by: Sabas Sous, MD Authorized by: Sabas Sous, MD   Critical care provider statement:    Critical care time (minutes):  45   Critical care was necessary to treat or prevent imminent or life-threatening deterioration of the following conditions:  Respiratory failure   Critical care was time spent personally by me on the following activities:  Development of treatment plan with patient or surrogate, discussions with consultants, evaluation of patient's response to treatment, examination of patient, ordering and review of laboratory studies, ordering and review of radiographic studies, ordering and performing treatments and interventions, pulse oximetry, re-evaluation of patient's condition and review of old charts   ED Course and Medical Decision Making  Initial Impression and Ddx Differential diagnosis includes COPD exacerbation, PE is a concern given the acute onset of patient's symptoms and her lungs being generally without wheezing.  Seems air hungry.  Will need PE  CTA.  Past medical/surgical history that increases complexity of ED encounter:  ***  Interpretation of Diagnostics I personally reviewed the {BEROINTERP:26835} and my interpretation is as follows:  ***  ***  Patient Reassessment and Ultimate Disposition/Management     ***  Patient management required discussion with the following services or consulting groups:  {BEROCONSULT:26841}  Complexity of Problems Addressed {BEROCOPA:26833}  Additional Data Reviewed and Analyzed Further history obtained from: {BERODATA:26834}  Additional Factors Impacting ED Encounter Risk {BERORISK:26838}  Elmer Sow. Pilar Plate, MD [facility] Emergency Medicine [facility] [email or web address]  Final Clinical Impressions(s) / ED Diagnoses     ICD-10-CM   1. Acute respiratory failure with hypoxia (HCC)  J96.01       ED Discharge Orders     None        Discharge Instructions Discussed with and Provided to Patient:   Discharge Instructions   None

## 2023-06-12 NOTE — ED Triage Notes (Signed)
Per EMS, pt from [facility], woke up very SOB (sudden onset).  Initially O2 was 80% on 4L home O2, was able to speak in complete sentences.  Pt was moving very little O2.  Cm of bottom pain.  Gave epi pen, solumedrol, 1 duo neb and one regular neg, mag 2g.  Placed on Cpap.    20  RA AC

## 2023-06-13 ENCOUNTER — Emergency Department (HOSPITAL_COMMUNITY): Payer: 59

## 2023-06-13 ENCOUNTER — Inpatient Hospital Stay (HOSPITAL_COMMUNITY): Payer: 59

## 2023-06-13 DIAGNOSIS — T82858A Stenosis of vascular prosthetic devices, implants and grafts, initial encounter: Secondary | ICD-10-CM | POA: Diagnosis not present

## 2023-06-13 DIAGNOSIS — F32A Depression, unspecified: Secondary | ICD-10-CM | POA: Diagnosis present

## 2023-06-13 DIAGNOSIS — Z66 Do not resuscitate: Secondary | ICD-10-CM | POA: Diagnosis present

## 2023-06-13 DIAGNOSIS — R4189 Other symptoms and signs involving cognitive functions and awareness: Secondary | ICD-10-CM | POA: Diagnosis present

## 2023-06-13 DIAGNOSIS — J9600 Acute respiratory failure, unspecified whether with hypoxia or hypercapnia: Secondary | ICD-10-CM | POA: Diagnosis present

## 2023-06-13 DIAGNOSIS — J189 Pneumonia, unspecified organism: Secondary | ICD-10-CM | POA: Diagnosis present

## 2023-06-13 DIAGNOSIS — G2581 Restless legs syndrome: Secondary | ICD-10-CM | POA: Diagnosis present

## 2023-06-13 DIAGNOSIS — I110 Hypertensive heart disease with heart failure: Secondary | ICD-10-CM | POA: Diagnosis present

## 2023-06-13 DIAGNOSIS — E039 Hypothyroidism, unspecified: Secondary | ICD-10-CM | POA: Diagnosis present

## 2023-06-13 DIAGNOSIS — I214 Non-ST elevation (NSTEMI) myocardial infarction: Secondary | ICD-10-CM | POA: Diagnosis present

## 2023-06-13 DIAGNOSIS — R571 Hypovolemic shock: Secondary | ICD-10-CM | POA: Diagnosis present

## 2023-06-13 DIAGNOSIS — J439 Emphysema, unspecified: Secondary | ICD-10-CM | POA: Diagnosis present

## 2023-06-13 DIAGNOSIS — J9601 Acute respiratory failure with hypoxia: Secondary | ICD-10-CM

## 2023-06-13 DIAGNOSIS — Y831 Surgical operation with implant of artificial internal device as the cause of abnormal reaction of the patient, or of later complication, without mention of misadventure at the time of the procedure: Secondary | ICD-10-CM | POA: Diagnosis present

## 2023-06-13 DIAGNOSIS — I21A1 Myocardial infarction type 2: Secondary | ICD-10-CM | POA: Diagnosis present

## 2023-06-13 DIAGNOSIS — I959 Hypotension, unspecified: Secondary | ICD-10-CM

## 2023-06-13 DIAGNOSIS — R1084 Generalized abdominal pain: Secondary | ICD-10-CM | POA: Diagnosis not present

## 2023-06-13 DIAGNOSIS — I5023 Acute on chronic systolic (congestive) heart failure: Secondary | ICD-10-CM | POA: Diagnosis present

## 2023-06-13 DIAGNOSIS — E871 Hypo-osmolality and hyponatremia: Secondary | ICD-10-CM | POA: Diagnosis present

## 2023-06-13 DIAGNOSIS — J9621 Acute and chronic respiratory failure with hypoxia: Secondary | ICD-10-CM | POA: Diagnosis present

## 2023-06-13 DIAGNOSIS — R911 Solitary pulmonary nodule: Secondary | ICD-10-CM | POA: Diagnosis present

## 2023-06-13 DIAGNOSIS — D638 Anemia in other chronic diseases classified elsewhere: Secondary | ICD-10-CM | POA: Diagnosis present

## 2023-06-13 DIAGNOSIS — K551 Chronic vascular disorders of intestine: Secondary | ICD-10-CM | POA: Diagnosis not present

## 2023-06-13 DIAGNOSIS — I700 Atherosclerosis of aorta: Secondary | ICD-10-CM | POA: Diagnosis present

## 2023-06-13 DIAGNOSIS — K623 Rectal prolapse: Secondary | ICD-10-CM | POA: Diagnosis present

## 2023-06-13 DIAGNOSIS — T82856A Stenosis of peripheral vascular stent, initial encounter: Secondary | ICD-10-CM | POA: Diagnosis present

## 2023-06-13 DIAGNOSIS — F03911 Unspecified dementia, unspecified severity, with agitation: Secondary | ICD-10-CM | POA: Diagnosis present

## 2023-06-13 DIAGNOSIS — D62 Acute posthemorrhagic anemia: Secondary | ICD-10-CM | POA: Diagnosis present

## 2023-06-13 DIAGNOSIS — J9622 Acute and chronic respiratory failure with hypercapnia: Secondary | ICD-10-CM | POA: Diagnosis present

## 2023-06-13 DIAGNOSIS — Z1152 Encounter for screening for COVID-19: Secondary | ICD-10-CM | POA: Diagnosis not present

## 2023-06-13 DIAGNOSIS — M549 Dorsalgia, unspecified: Secondary | ICD-10-CM | POA: Diagnosis present

## 2023-06-13 DIAGNOSIS — G894 Chronic pain syndrome: Secondary | ICD-10-CM | POA: Diagnosis present

## 2023-06-13 LAB — COMPREHENSIVE METABOLIC PANEL
ALT: 21 U/L (ref 0–44)
AST: 29 U/L (ref 15–41)
Albumin: 3.3 g/dL — ABNORMAL LOW (ref 3.5–5.0)
Alkaline Phosphatase: 72 U/L (ref 38–126)
Anion gap: 15 (ref 5–15)
BUN: 18 mg/dL (ref 8–23)
CO2: 26 mmol/L (ref 22–32)
Calcium: 8.8 mg/dL — ABNORMAL LOW (ref 8.9–10.3)
Chloride: 93 mmol/L — ABNORMAL LOW (ref 98–111)
Creatinine, Ser: 0.92 mg/dL (ref 0.44–1.00)
GFR, Estimated: >60 mL/min (ref >60)
Glucose, Bld: 274 mg/dL — ABNORMAL HIGH (ref 70–99)
Potassium: 3.9 mmol/L (ref 3.5–5.1)
Sodium: 134 mmol/L — ABNORMAL LOW (ref 135–145)
Total Bilirubin: 0.4 mg/dL (ref <1.2)
Total Protein: 7 g/dL (ref 6.5–8.1)

## 2023-06-13 LAB — BASIC METABOLIC PANEL
Anion gap: 12 (ref 5–15)
BUN: 18 mg/dL (ref 8–23)
CO2: 27 mmol/L (ref 22–32)
Calcium: 9.8 mg/dL (ref 8.9–10.3)
Chloride: 96 mmol/L — ABNORMAL LOW (ref 98–111)
Creatinine, Ser: 0.84 mg/dL (ref 0.44–1.00)
GFR, Estimated: >60 mL/min (ref >60)
Glucose, Bld: 121 mg/dL — ABNORMAL HIGH (ref 70–99)
Potassium: 4.4 mmol/L (ref 3.5–5.1)
Sodium: 135 mmol/L (ref 135–145)

## 2023-06-13 LAB — PHOSPHORUS: Phosphorus: 3.7 mg/dL (ref 2.5–4.6)

## 2023-06-13 LAB — CBC
HCT: 22.3 % — ABNORMAL LOW (ref 36.0–46.0)
HCT: 23.2 % — ABNORMAL LOW (ref 36.0–46.0)
Hemoglobin: 6.3 g/dL — CL (ref 12.0–15.0)
Hemoglobin: 7.2 g/dL — ABNORMAL LOW (ref 12.0–15.0)
MCH: 24.7 pg — ABNORMAL LOW (ref 26.0–34.0)
MCH: 26.8 pg (ref 26.0–34.0)
MCHC: 28.3 g/dL — ABNORMAL LOW (ref 30.0–36.0)
MCHC: 31 g/dL (ref 30.0–36.0)
MCV: 86.2 fL (ref 80.0–100.0)
MCV: 87.5 fL (ref 80.0–100.0)
Platelets: 358 10*3/uL (ref 150–400)
Platelets: 507 10*3/uL — ABNORMAL HIGH (ref 150–400)
RBC: 2.55 MIL/uL — ABNORMAL LOW (ref 3.87–5.11)
RBC: 2.69 MIL/uL — ABNORMAL LOW (ref 3.87–5.11)
RDW: 15.9 % — ABNORMAL HIGH (ref 11.5–15.5)
RDW: 16.3 % — ABNORMAL HIGH (ref 11.5–15.5)
WBC: 18.1 10*3/uL — ABNORMAL HIGH (ref 4.0–10.5)
WBC: 19.8 10*3/uL — ABNORMAL HIGH (ref 4.0–10.5)
nRBC: 0.4 % — ABNORMAL HIGH (ref 0.0–0.2)
nRBC: 0.6 % — ABNORMAL HIGH (ref 0.0–0.2)

## 2023-06-13 LAB — HEMOGLOBIN AND HEMATOCRIT, BLOOD
HCT: 24 % — ABNORMAL LOW (ref 36.0–46.0)
HCT: 26.8 % — ABNORMAL LOW (ref 36.0–46.0)
Hemoglobin: 7.5 g/dL — ABNORMAL LOW (ref 12.0–15.0)
Hemoglobin: 8.7 g/dL — ABNORMAL LOW (ref 12.0–15.0)

## 2023-06-13 LAB — TROPONIN I (HIGH SENSITIVITY)
Troponin I (High Sensitivity): 130 ng/L (ref <18)
Troponin I (High Sensitivity): 166 ng/L (ref <18)
Troponin I (High Sensitivity): 242 ng/L (ref <18)
Troponin I (High Sensitivity): 229 ng/L (ref <18)

## 2023-06-13 LAB — T4, FREE: Free T4: 1.21 ng/dL — ABNORMAL HIGH (ref 0.61–1.12)

## 2023-06-13 LAB — PREPARE RBC (CROSSMATCH)

## 2023-06-13 LAB — CBG MONITORING, ED
Glucose-Capillary: 143 mg/dL — ABNORMAL HIGH (ref 70–99)
Glucose-Capillary: 125 mg/dL — ABNORMAL HIGH (ref 70–99)
Glucose-Capillary: 113 mg/dL — ABNORMAL HIGH (ref 70–99)
Glucose-Capillary: 111 mg/dL — ABNORMAL HIGH (ref 70–99)

## 2023-06-13 LAB — IRON AND TIBC
Iron: 96 ug/dL (ref 28–170)
Saturation Ratios: 18 % (ref 10.4–31.8)
TIBC: 528 ug/dL — ABNORMAL HIGH (ref 250–450)
UIBC: 432 ug/dL

## 2023-06-13 LAB — LIPID PANEL
Cholesterol: 104 mg/dL (ref 0–200)
HDL: 65 mg/dL (ref >40)
LDL Cholesterol: 36 mg/dL (ref 0–99)
Total CHOL/HDL Ratio: 1.6 {ratio}
Triglycerides: 15 mg/dL (ref <150)
VLDL: 3 mg/dL (ref 0–40)

## 2023-06-13 LAB — LACTIC ACID, PLASMA
Lactic Acid, Venous: 1.9 mmol/L (ref 0.5–1.9)
Lactic Acid, Venous: 2.1 mmol/L (ref 0.5–1.9)

## 2023-06-13 LAB — STREP PNEUMONIAE URINARY ANTIGEN: Strep Pneumo Urinary Antigen: NEGATIVE

## 2023-06-13 LAB — TSH: TSH: 0.545 u[IU]/mL (ref 0.350–4.500)

## 2023-06-13 LAB — RESP PANEL BY RT-PCR (RSV, FLU A&B, COVID)  RVPGX2
Influenza A by PCR: NEGATIVE
Influenza B by PCR: NEGATIVE
Resp Syncytial Virus by PCR: NEGATIVE
SARS Coronavirus 2 by RT PCR: NEGATIVE

## 2023-06-13 LAB — MAGNESIUM: Magnesium: 2.3 mg/dL (ref 1.7–2.4)

## 2023-06-13 LAB — PROCALCITONIN: Procalcitonin: 0.2 ng/mL

## 2023-06-13 LAB — GLUCOSE, CAPILLARY: Glucose-Capillary: 195 mg/dL — ABNORMAL HIGH (ref 70–99)

## 2023-06-13 LAB — FERRITIN: Ferritin: 5 ng/mL — ABNORMAL LOW (ref 11–307)

## 2023-06-13 LAB — BRAIN NATRIURETIC PEPTIDE: B Natriuretic Peptide: 502.9 pg/mL — ABNORMAL HIGH (ref 0.0–100.0)

## 2023-06-13 MED ORDER — INSULIN ASPART 100 UNIT/ML IJ SOLN
0.0000 [IU] | INTRAMUSCULAR | Status: DC
  Administered 2023-06-13: 1 [IU] via SUBCUTANEOUS

## 2023-06-13 MED ORDER — PANTOPRAZOLE SODIUM 40 MG IV SOLR: 40.0000 mg | Freq: Two times a day (BID) | INTRAVENOUS | Status: DC

## 2023-06-13 MED ORDER — IPRATROPIUM BROMIDE 0.02 % IN SOLN
0.5000 mg | Freq: Four times a day (QID) | RESPIRATORY_TRACT | Status: DC
  Administered 2023-06-13 – 2023-06-14 (×3): 0.5 mg via RESPIRATORY_TRACT
  Filled 2023-06-13 (×4): qty 2.5

## 2023-06-13 MED ORDER — IOHEXOL 350 MG/ML SOLN
50.0000 mL | Freq: Once | INTRAVENOUS | Status: AC | PRN
  Administered 2023-06-13: 50 mL via INTRAVENOUS

## 2023-06-13 MED ORDER — MORPHINE SULFATE (PF) 2 MG/ML IV SOLN
2.0000 mg | INTRAVENOUS | Status: DC | PRN
  Administered 2023-06-13 – 2023-06-14 (×5): 2 mg via INTRAVENOUS
  Filled 2023-06-13 (×5): qty 1

## 2023-06-13 MED ORDER — LEVOFLOXACIN IN D5W 750 MG/150ML IV SOLN
750.0000 mg | Freq: Once | INTRAVENOUS | Status: AC
  Administered 2023-06-13: 750 mg via INTRAVENOUS
  Filled 2023-06-13: qty 150

## 2023-06-13 MED ORDER — SODIUM CHLORIDE 0.9% IV SOLUTION: Freq: Once | INTRAVENOUS | Status: AC

## 2023-06-13 MED ORDER — SODIUM CHLORIDE 0.9 % IV SOLN
500.0000 mg | Freq: Every day | INTRAVENOUS | Status: AC
  Administered 2023-06-13 – 2023-06-15 (×3): 500 mg via INTRAVENOUS
  Filled 2023-06-13 (×3): qty 5

## 2023-06-13 MED ORDER — SODIUM CHLORIDE 0.9% IV SOLUTION: Freq: Once | INTRAVENOUS | Status: DC

## 2023-06-13 MED ORDER — FLUTICASONE FUROATE-VILANTEROL 200-25 MCG/ACT IN AEPB
1.0000 | INHALATION_SPRAY | Freq: Every day | RESPIRATORY_TRACT | Status: DC
  Administered 2023-06-16 – 2023-06-17 (×2): 1 via RESPIRATORY_TRACT
  Filled 2023-06-13 (×2): qty 28

## 2023-06-13 MED ORDER — MORPHINE SULFATE (PF) 2 MG/ML IV SOLN
1.0000 mg | INTRAVENOUS | Status: DC | PRN
  Administered 2023-06-13 (×2): 1 mg via INTRAVENOUS
  Filled 2023-06-13 (×2): qty 1

## 2023-06-13 MED ORDER — DOCUSATE SODIUM 100 MG PO CAPS
100.0000 mg | ORAL_CAPSULE | Freq: Two times a day (BID) | ORAL | Status: DC | PRN
Start: 2023-06-13 — End: 2023-06-17

## 2023-06-13 MED ORDER — PRAMIPEXOLE DIHYDROCHLORIDE 0.25 MG PO TABS
0.2500 mg | ORAL_TABLET | Freq: Two times a day (BID) | ORAL | Status: DC
  Administered 2023-06-13 – 2023-06-17 (×9): 0.25 mg via ORAL
  Filled 2023-06-13 (×12): qty 1

## 2023-06-13 MED ORDER — PANTOPRAZOLE SODIUM 40 MG IV SOLR
40.0000 mg | Freq: Two times a day (BID) | INTRAVENOUS | Status: DC
  Administered 2023-06-13 – 2023-06-16 (×6): 40 mg via INTRAVENOUS
  Filled 2023-06-13 (×6): qty 10

## 2023-06-13 MED ORDER — PANTOPRAZOLE SODIUM 40 MG IV SOLR
40.0000 mg | INTRAVENOUS | Status: AC
  Administered 2023-06-13 (×2): 40 mg via INTRAVENOUS
  Filled 2023-06-13: qty 10

## 2023-06-13 MED ORDER — NOREPINEPHRINE 4 MG/250ML-% IV SOLN: 0.0000 ug/min | INTRAVENOUS | Status: DC

## 2023-06-13 MED ORDER — CALCIUM GLUCONATE-NACL 2-0.675 GM/100ML-% IV SOLN
2.0000 g | Freq: Once | INTRAVENOUS | Status: AC
  Administered 2023-06-13: 2000 mg via INTRAVENOUS
  Filled 2023-06-13: qty 100

## 2023-06-13 MED ORDER — LEVALBUTEROL HCL 0.63 MG/3ML IN NEBU: 0.6300 mg | INHALATION_SOLUTION | RESPIRATORY_TRACT | Status: DC | PRN

## 2023-06-13 MED ORDER — LEVOTHYROXINE SODIUM 75 MCG PO TABS
75.0000 ug | ORAL_TABLET | Freq: Every day | ORAL | Status: DC
  Administered 2023-06-13 – 2023-06-17 (×5): 75 ug via ORAL
  Filled 2023-06-13 (×6): qty 1

## 2023-06-13 MED ORDER — MIDODRINE HCL 5 MG PO TABS
5.0000 mg | ORAL_TABLET | Freq: Three times a day (TID) | ORAL | Status: DC
  Administered 2023-06-13 – 2023-06-14 (×4): 5 mg via ORAL
  Filled 2023-06-13 (×4): qty 1

## 2023-06-13 MED ORDER — POLYETHYLENE GLYCOL 3350 17 G PO PACK: 17.0000 g | PACK | Freq: Every day | ORAL | Status: DC | PRN

## 2023-06-13 MED ORDER — LEVALBUTEROL HCL 0.63 MG/3ML IN NEBU
0.6300 mg | INHALATION_SOLUTION | Freq: Four times a day (QID) | RESPIRATORY_TRACT | Status: DC | PRN
  Administered 2023-06-13 (×2): 0.63 mg via RESPIRATORY_TRACT
  Filled 2023-06-13 (×2): qty 3

## 2023-06-13 MED ORDER — SODIUM CHLORIDE 0.9 % IV SOLN: 500.0000 mg | Freq: Every day | INTRAVENOUS | Status: DC

## 2023-06-13 MED ORDER — ATORVASTATIN CALCIUM 80 MG PO TABS
80.0000 mg | ORAL_TABLET | Freq: Every day | ORAL | Status: DC
  Administered 2023-06-13 – 2023-06-17 (×5): 80 mg via ORAL
  Filled 2023-06-13 (×3): qty 1
  Filled 2023-06-13: qty 2
  Filled 2023-06-13 (×2): qty 1

## 2023-06-13 MED ORDER — HYDROMORPHONE HCL 1 MG/ML IJ SOLN
0.5000 mg | INTRAMUSCULAR | Status: DC | PRN
  Administered 2023-06-13 – 2023-06-17 (×7): 0.5 mg via INTRAVENOUS
  Filled 2023-06-13 (×4): qty 0.5
  Filled 2023-06-13: qty 1
  Filled 2023-06-13 (×3): qty 0.5

## 2023-06-13 MED ORDER — IOHEXOL 350 MG/ML SOLN
75.0000 mL | Freq: Once | INTRAVENOUS | Status: AC | PRN
  Administered 2023-06-13: 75 mL via INTRAVENOUS

## 2023-06-13 NOTE — ED Notes (Signed)
About 20 min ago the pt began crying out that her ear hurt after a coughing fit.  She said she thought she had maybe poked her ear inside with the end of a mouth swab.  I did not notice any obvious injury at first but  Cardiology noted blood coming from her ear on their assessment.   Peter with [facility] notified.

## 2023-06-13 NOTE — ED Notes (Signed)
CCM bedside - informed pt is now DNR

## 2023-06-13 NOTE — ED Notes (Signed)
Spoke with daughter with patient update

## 2023-06-13 NOTE — Progress Notes (Signed)
BP better Lactate reassuring  Trop I trending down.  Seen by both GI and cards Awaiting results of echo [facility] for progressive

## 2023-06-13 NOTE — Consult Note (Addendum)
Cardiology Consultation   Patient ID: Antwan Rucci MRN: [id number]; DOB: 25-Jul-1934  Admit date: 06/12/2023 Date of Consult: 06/13/2023  PCP:  Annita Brod, MD   [facility] Providers Cardiologist:  None   {  Patient Profile:   Shye Lavigne is a 88 y.o. female with a hx of COPD chronically on 3L, mesenteric ischemia status post SMA stenting, CVA, chronic rectal prolapse, hypothyroidism, hypochronic hyponatremia, chronic dysphagia/esophageal motility, chronic pain syndrome who is being seen 06/13/2023 for the evaluation of elevated troponins at the request of critical care.  History of Present Illness:   Ms. Purviance patient has no prior cardiac history.  She has no known diabetes.  Reports smoking off and on for the last 40 years and has COPD.  No drugs or alcohol.  Currently patient being evaluated for acute blood loss anemia and acute on chronic respiratory failure with concerns of infectious etiology.  Patient today woke up with acute onset of shortness of breath and severe rectal pain.  Had noted some blood when wiping.  EMS transported her to the ED where she was placed on BiPAP, noted to have had a hemoglobin of 6.3 which was transfused with 1 unit of blood.  Initially required Levophed due to hypotension and now transition to midodrine 5 mg 3 times daily.  Chest x-ray was concerning for groundglass opacities that may represent pulmonary edema versus infection, started on antibiotics.  Troponins [id number], cardiology asked to see.   Patient is extremely agitated right now and reports significant pain likely related to her rectal prolapse.  Reports severe pain from the waist down and some chronic back pain.  She also has this sponge lollipop thing that she had itched her ear with but now has blood running down her ear and obvious trauma with blood pooling in the eardrum.  Critical care notified.  She is somewhat of a poor historian and that she has tangential  responses however does not clearly report any chest pain, palpitations, shortness of breath, orthopnea.  She reports some peripheral edema but not on a diuretic.  She reports after using the bathroom and sitting down that she has this sensation that her stomach is going into her rib cage and makes it difficult for her to breathe.  She ambulates minimally with a walker and often just goes from the bathroom to her bedroom at her facility but does not note any significant DOE or decrease in exercise intolerance from this.  CTA negative for PE.  Has nodular opacities in the left upper lobe measuring 9 mm that might be infectious or inflammatory.  Moderate emphysema.  Some aortic atherosclerosis.  BNP in the 500s.  TSH normal, T41.2.  Hemoglobin now 7.2 after transfusion.  Tachycardic in the 110s, BP has now stabilized in the 120s.   Past Medical History:  Diagnosis Date   Acid reflux    COPD (chronic obstructive pulmonary disease) (HCC)    Hypertension     Past Surgical History:  Procedure Laterality Date   BIOPSY  10/17/2021   Procedure: BIOPSY;  Surgeon: Charlott Rakes, MD;  [facility]: [facility];  Service: Gastroenterology;;   BIOPSY  01/29/2023   Procedure: BIOPSY;  Surgeon: Charlott Rakes, MD;  Location: [facility];  Service: Gastroenterology;;   COLONOSCOPY N/A 10/17/2021   Procedure: COLONOSCOPY;  Surgeon: Charlott Rakes, MD;  Location: [facility];  Service: Gastroenterology;  Laterality: N/A;   COLONOSCOPY WITH PROPOFOL N/A 10/18/2021   Procedure: COLONOSCOPY WITH PROPOFOL;  Surgeon: Kathi Der,  MD;  Location: [facility];  Service: Gastroenterology;  Laterality: N/A;   ESOPHAGOGASTRODUODENOSCOPY N/A 10/17/2021   Procedure: ESOPHAGOGASTRODUODENOSCOPY (EGD);  Surgeon: Charlott Rakes, MD;  Location: [facility];  Service: Gastroenterology;  Laterality: N/A;   FLEXIBLE SIGMOIDOSCOPY N/A 01/29/2023   Procedure: FLEXIBLE SIGMOIDOSCOPY;  Surgeon: Charlott Rakes, MD;   Location: [facility];  Service: Gastroenterology;  Laterality: N/A;   POLYPECTOMY  10/18/2021   Procedure: POLYPECTOMY;  Surgeon: Kathi Der, MD;  Location: [facility];  Service: Gastroenterology;;    Inpatient Medications: Scheduled Meds:  sodium chloride   Intravenous Once   sodium chloride   Intravenous Once   atorvastatin  80 mg Oral Daily   fluticasone furoate-vilanterol  1 puff Inhalation Daily   insulin aspart  0-9 Units Subcutaneous Q4H   ipratropium  0.5 mg Nebulization QID   levothyroxine  75 mcg Oral QAC breakfast   midodrine  5 mg Oral TID WC   pantoprazole (PROTONIX) IV  40 mg Intravenous Q12H   pramipexole  0.25 mg Oral BID   Continuous Infusions:  azithromycin     norepinephrine (LEVOPHED) Adult infusion Stopped (06/13/23 0419)   PRN Meds: docusate sodium, levalbuterol, morphine injection, polyethylene glycol  Allergies:    Allergies  Allergen Reactions   Penicillins Anaphylaxis, Swelling and Other (See Comments)   Penicillin G Hives    Social History:   Social History   Socioeconomic History   Marital status: Single    Spouse name: Not on file   Number of children: Not on file   Years of education: Not on file   Highest education level: Not on file  Occupational History   Not on file  Tobacco Use   Smoking status: Every Day    Current packs/day: 1.50    Types: Cigarettes   Smokeless tobacco: Never  Vaping Use   Vaping status: Never Used  Substance and Sexual Activity   Alcohol use: No   Drug use: No   Sexual activity: Not on file  Other Topics Concern   Not on file  Social History Narrative   Not on file   Social Drivers of Health   Financial Resource Strain: Not on file  Food Insecurity: No Food Insecurity (01/28/2023)   Hunger Vital Sign    Worried About Running Out of Food in the Last Year: Never true    Ran Out of Food in the Last Year: Never true  Transportation Needs: No Transportation Needs (01/28/2023)   PRAPARE -  Administrator, Civil Service (Medical): No    Lack of Transportation (Non-Medical): No  Physical Activity: Not on file  Stress: Not on file  Social Connections: Not on file  Intimate Partner Violence: Not At Risk (01/28/2023)   Humiliation, Afraid, Rape, and Kick questionnaire    Fear of Current or Ex-Partner: No    Emotionally Abused: No    Physically Abused: No    Sexually Abused: No    Family History:  History reviewed. No pertinent family history.   ROS:  Please see the history of present illness.  All other ROS reviewed and negative.     Physical Exam/Data:   Vitals:   06/13/23 0700 06/13/23 0730 06/13/23 0800 06/13/23 0815  BP: 102/79 112/81 (!) 110/90 127/80  Pulse: (!) 109 (!) 108 65 (!) 110  Resp: 19 17 12 19   Temp:      TempSrc:      SpO2: 98% 98% 98% 99%  Weight:      Height:  Intake/Output Summary (Last 24 hours) at 06/13/2023 1028 Last data filed at 06/13/2023 0533 Gross per 24 hour  Intake 50 ml  Output --  Net 50 ml      06/12/2023   11:40 PM 04/26/2023    9:18 AM 01/28/2023   11:45 AM  Last 3 Weights  Weight (lbs) 132 lb 0.9 oz 132 lb 121 lb 4.1 oz  Weight (kg) 59.9 kg 59.875 kg 55 kg     Body mass index is 23.39 kg/m.  General: Diffuse pain worse rectally HEENT: normal Neck: + JVD Vascular: No carotid bruits; Distal pulses 2+ bilaterally Cardiac:  normal S1, S2; RRR; no murmur.  Tachycardic Lungs: Diminished with wheezes Abd: soft, nontender, no hepatomegaly  Ext: no edema Musculoskeletal:  No deformities, BUE and BLE strength normal and equal Skin: warm and dry  Neuro:  CNs 2-12 intact, no focal abnormalities noted Psych:  Normal affect   EKG:  The EKG was personally reviewed and demonstrates: Sinus tachycardia heart rate 116.  Some ST downsloping/depression.  Laterally Telemetry:  Telemetry was personally reviewed and demonstrates: Sinus tachycardia heart rates between 100 1020  Relevant CV Studies: Echocardiogram  pending  Laboratory Data:  High Sensitivity Troponin:   Recent Labs  Lab 06/12/23 2333 06/13/23 0232 06/13/23 0555  TROPONINIHS 130* 166* 242*     Chemistry Recent Labs  Lab 06/12/23 2333 06/12/23 2353 06/13/23 0555  NA 134* 134* 135  K 3.9 3.8 4.4  CL 93*  --  96*  CO2 26  --  27  GLUCOSE 274*  --  121*  BUN 18  --  18  CREATININE 0.92  --  0.84  CALCIUM 8.8*  --  9.8  MG  --   --  2.3  GFRNONAA >60  --  >60  ANIONGAP 15  --  12    Recent Labs  Lab 06/12/23 2333  PROT 7.0  ALBUMIN 3.3*  AST 29  ALT 21  ALKPHOS 72  BILITOT 0.4   Lipids  Recent Labs  Lab 06/13/23 0555  CHOL 104  TRIG 15  HDL 65  LDLCALC 36  CHOLHDL 1.6    Hematology Recent Labs  Lab 06/12/23 2333 06/12/23 2353 06/13/23 0555  WBC 19.8*  --  18.1*  RBC 2.55*  --  2.69*  HGB 6.3* 7.8* 7.2*  HCT 22.3* 23.0* 23.2*  MCV 87.5  --  86.2  MCH 24.7*  --  26.8  MCHC 28.3*  --  31.0  RDW 16.3*  --  15.9*  PLT 507*  --  358   Thyroid  Recent Labs  Lab 06/13/23 0555  TSH 0.545  FREET4 1.21*    BNP Recent Labs  Lab 06/12/23 2333  BNP 502.9*    DDimer No results for input(s): "DDIMER" in the last 168 hours.   Radiology/Studies:  CT Angio Chest Pulmonary Embolism (PE) W or WO Contrast Result Date: 06/13/2023 CLINICAL DATA:  Pulmonary embolism (PE) suspected, high prob Respiratory distress. EXAM: CT ANGIOGRAPHY CHEST WITH CONTRAST TECHNIQUE: Multidetector CT imaging of the chest was performed using the standard protocol during bolus administration of intravenous contrast. Multiplanar CT image reconstructions and MIPs were obtained to evaluate the vascular anatomy. RADIATION DOSE REDUCTION: This exam was performed according to the departmental dose-optimization program which includes automated exposure control, adjustment of the mA and/or kV according to patient size and/or use of iterative reconstruction technique. CONTRAST:  50mL OMNIPAQUE IOHEXOL 350 MG/ML SOLN COMPARISON:   Radiograph yesterday.  Chest CT 10/13/2021  FINDINGS: Cardiovascular: Heart is normal in size. There are no filling defects within the pulmonary arteries to suggest pulmonary embolus. Advanced calcified noncalcified atheromatous plaque throughout the thoracic aorta. This includes irregular plaque throughout the descending aorta. No aneurysm or acute aortic findings. 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 There are coronary artery calcifications. No pericardial effusion. Mediastinum/Nodes: Enlarged 14 mm right hilar lymph node no enlarged mediastinal nodes. No thyroid nodule. Small hiatal hernia. Lungs/Pleura: Moderate emphysema. Moderate bronchial thickening. Areas of interstitial and septal thickening primarily involving the lung bases and anterior lungs. Minimal consolidation dependently in the lower lobes. 3 mm right upper lobe nodule series 6, image 53, unchanged. Perifissural 7 mm right lower lobe nodule series 6, image 95, unchanged. One of the right lower lobe nodules on prior has resolved, 1 of the right lower lobe nodules on prior is obscured on the current exam. There are in new ill-defined nodular opacities in the left upper lobe series 6, image 32 measuring 9 mm medially and 6 mm laterally. Ill-defined left upper lobe opacity series 6, image 51, new. Punctate benign granuloma in the left upper lobe. No significant pleural effusion. Upper Abdomen: No acute upper abdominal findings. Musculoskeletal: Mild scoliosis and thoracic spondylosis. There are no acute or suspicious osseous abnormalities. Review of the MIP images confirms the above findings. IMPRESSION: 1. No pulmonary embolus. 2. Areas of interstitial and septal thickening primarily involving the lung bases and anterior lungs, suspicious for pulmonary edema. Atypical infection could have a similar appearance. 3. Minimal consolidation/atelectasis dependently in the lower lobes. 4. New ill-defined nodular opacities in the left  upper lobe measuring up to 9 mm, likely infectious or inflammatory. Recommend CT follow-up in 3 months. 5. Many of the right lung pulmonary nodules on prior exam are unchanged. At least 1 of the previous right lung nodules has resolved. No specific follow-up of these nodules is needed. 6. Enlarged right hilar lymph node, likely reactive. 7. Moderate emphysema. Aortic Atherosclerosis (ICD10-I70.0) and Emphysema (ICD10-J43.9). Electronically Signed   By: Narda Rutherford M.D.   On: 06/13/2023 02:45   DG Chest Port 1 View Result Date: 06/12/2023 CLINICAL DATA:  Short of breath EXAM: PORTABLE CHEST 1 VIEW COMPARISON:  10/13/2021 FINDINGS: Single frontal view of the chest demonstrates a stable cardiac silhouette. Diffuse interstitial opacities are seen throughout the lungs, with patchy bibasilar ground-glass airspace disease. No effusion or pneumothorax. No acute bony abnormalities. IMPRESSION: 1. Diffuse interstitial prominence with patchy bibasilar ground-glass airspace disease, which may reflect pulmonary edema or atypical infection such as viral pneumonitis. Electronically Signed   By: Sharlet Salina M.D.   On: 06/12/2023 23:55     Assessment and Plan:   Elevated troponins This is in the setting of acute blood loss anemia with known rectal prolapse now presenting with acute on chronic respiratory failure with findings concerning for infectious etiology.  Also noted to be hypotensive requiring Levophed now on midodrine.  EKG shows subtle lateral ST sloping/depression that might be rate related.  She does not exhibit clear ischemic symptoms concerning for ACS.  Troponins [phone].  Suspect this is more related to demand ischemia.  Nonetheless would not be catheterization candidate with the above.  Can consider other ischemic evaluation once she is more stable, will discuss with MD. Will follow-up on echocardiogram, has JVD with mild edema peripherally.  Agree with holding diuretics for now.  Will titrate  GDMT accordingly if reduced EF. Continue to hold blood thinners  Acute blood loss anemia  History of mesenteric ischemia status post SMA stent Status post 1 unit PRBC, hemoglobin now 7.2.  Some concern for ischemic colitis.  GI to see.  Acute on chronic respiratory failure, possible infection Hypotension requiring midodrine History of CVA Ear trauma Per critical care.   Risk Assessment/Risk Scores:    New York Heart Association (NYHA) Functional Class NYHA Class II For questions or updates, please contact [facility] Please consult [email or web address] for contact info under  Signed, Abagail Kitchens, PA-C  06/13/2023 10:28 AM   Patient seen and examined, note reviewed with the signed Advanced Practice Provider. I personally reviewed laboratory data, imaging studies and relevant notes. I independently examined the patient and formulated the important aspects of the plan. I have personally discussed the plan with the patient and/or family. Comments or changes to the note/plan are indicated below.  This 88 year old female with chronic COPD on 3 L nasal cannula at home, status post SMA stent due to mesenteric ischemia, CVA, chronic rectal prolapse hypothyroid presented anemic with a hemoglobin of 6.3 and elevated troponin [id number] .   For elevated troponin cardiology has been asked to evaluate the patient.  Clinically she does not have any anginal symptoms at this point.  Her troponins I suspect is type II demand ischemia in the setting of her severe anemia. We will not be pursuing any ischemic evaluation at this time.  Agree with the echocardiogram. Her blood pressure has improved transient hypotension requiring Levophed now on midodrine I support this.  Will benefit from diuretics given suspicion of high-output failure in the setting of her anemia.  But once blood pressure is stable we can initiate IV diuretics. She will benefit from GI evaluation.  Thomasene Ripple DO, MS  [facility] Attending Cardiologist [facility]  [address] [place], [place] [address] [phone] Website: [email or web address]

## 2023-06-13 NOTE — Progress Notes (Signed)
Pt transported to CT and back on BIPAP without complications

## 2023-06-13 NOTE — Progress Notes (Signed)
RT found pt off BiPAP. Pt appears comfortable and resting. No distress noted at this time.

## 2023-06-13 NOTE — Progress Notes (Signed)
Pt. Lactic Acid 2.1. On call for [facility] paged to make aware.

## 2023-06-13 NOTE — ED Notes (Signed)
Provider aware of critical trop

## 2023-06-13 NOTE — ED Notes (Signed)
Back from CT, RN to get blood for administration.

## 2023-06-13 NOTE — ED Notes (Signed)
Patient still tearful and in pain after morphine. Informed provider. See new orders.

## 2023-06-13 NOTE — ED Notes (Signed)
Pt agrees to blood transfusion, signed consent w/ RN.

## 2023-06-13 NOTE — ED Notes (Signed)
Provider made aware of critical hgb.

## 2023-06-13 NOTE — ED Notes (Signed)
ED TO INPATIENT HANDOFF REPORT  ED Nurse Name and Phone #: Josh  S Name/Age/Gender Amy Mckay 88 y.o. female Room/Bed: TRABC/TRABC  Code Status   Code Status: Limited: Do not attempt resuscitation (DNR) -DNR-LIMITED -Do Not Intubate/DNI   Home/SNF/Other SNF Patient oriented to: self, place, time, and situation Is this baseline? Yes   Triage Complete: Triage complete  Chief Complaint NSTEMI (non-ST elevated myocardial infarction) [facility]) [I21.4]  Triage Note Per EMS, pt from [facility], woke up very SOB (sudden onset).  Initially O2 was 80% on 4L home O2, was able to speak in complete sentences.  Pt was moving very little O2.  Cm of bottom pain.  Gave epi pen, solumedrol, 1 duo neb and one regular neg, mag 2g.  Placed on Cpap.    20  RA AC   Allergies Allergies  Allergen Reactions   Penicillins Anaphylaxis, Swelling and Other (See Comments)   Penicillin G Hives    Level of Care/Admitting Diagnosis ED Disposition     ED Disposition  Admit   Condition  --   Comment  Hospital Area: [facility] [[id number]]  Level of Care: ICU [6]  May admit patient to [facility] or [facility] if equivalent level of care is available:: No  Covid Evaluation: Asymptomatic - no recent exposure (last 10 days) testing not required  Diagnosis: NSTEMI (non-ST elevated myocardial infarction) [facility]) [[id number]]  Admitting Physician: Patrici Ranks [[id number]]  Attending Physician: Patrici Ranks [[id number]]  Certification:: I certify this patient will need inpatient services for at least 2 midnights  Expected Medical Readiness: 06/16/2023          B Medical/Surgery History Past Medical History:  Diagnosis Date   Acid reflux    COPD (chronic obstructive pulmonary disease) (HCC)    Hypertension    Past Surgical History:  Procedure Laterality Date   BIOPSY  10/17/2021   Procedure: BIOPSY;  Surgeon: Charlott Rakes, MD;  Location: [facility];   Service: Gastroenterology;;   BIOPSY  01/29/2023   Procedure: BIOPSY;  Surgeon: Charlott Rakes, MD;  Location: [facility];  Service: Gastroenterology;;   COLONOSCOPY N/A 10/17/2021   Procedure: COLONOSCOPY;  Surgeon: Charlott Rakes, MD;  Location: [facility];  Service: Gastroenterology;  Laterality: N/A;   COLONOSCOPY WITH PROPOFOL N/A 10/18/2021   Procedure: COLONOSCOPY WITH PROPOFOL;  Surgeon: Kathi Der, MD;  Location: [facility];  Service: Gastroenterology;  Laterality: N/A;   ESOPHAGOGASTRODUODENOSCOPY N/A 10/17/2021   Procedure: ESOPHAGOGASTRODUODENOSCOPY (EGD);  Surgeon: Charlott Rakes, MD;  Location: [facility];  Service: Gastroenterology;  Laterality: N/A;   FLEXIBLE SIGMOIDOSCOPY N/A 01/29/2023   Procedure: FLEXIBLE SIGMOIDOSCOPY;  Surgeon: Charlott Rakes, MD;  Location: [facility];  Service: Gastroenterology;  Laterality: N/A;   POLYPECTOMY  10/18/2021   Procedure: POLYPECTOMY;  Surgeon: Kathi Der, MD;  Location: [facility];  Service: Gastroenterology;;     A IV Location/Drains/Wounds Patient Lines/Drains/Airways Status     Active Line/Drains/Airways     Name Placement date Placement time Site Days   Peripheral IV 06/12/23 Anterior;Proximal;Right Antecubital 06/12/23  2337  Antecubital  1   Peripheral IV 06/13/23 20 G Anterior;Left Forearm 06/13/23  0120  Forearm  less than 1            Intake/Output Last 24 hours  Intake/Output Summary (Last 24 hours) at 06/13/2023 1050 Last data filed at 06/13/2023 0533 Gross per 24 hour  Intake 50 ml  Output --  Net 50 ml    Labs/Imaging Results for  orders placed or performed during the hospital encounter of 06/12/23 (from the past 48 hours)  CBC     Status: Abnormal   Collection Time: 06/12/23 11:33 PM  Result Value Ref Range   WBC 19.8 (H) 4.0 - 10.5 K/uL   RBC 2.55 (L) 3.87 - 5.11 MIL/uL   Hemoglobin 6.3 (LL) 12.0 - 15.0 g/dL    Comment: REPEATED TO VERIFY THIS CRITICAL RESULT HAS  VERIFIED AND BEEN CALLED TO J.GLOSTER RN BY GLENDA GANADEN ON 12 29 2024 AT 0006, AND HAS BEEN READ BACK.     HCT 22.3 (L) 36.0 - 46.0 %   MCV 87.5 80.0 - 100.0 fL   MCH 24.7 (L) 26.0 - 34.0 pg   MCHC 28.3 (L) 30.0 - 36.0 g/dL   RDW 16.1 (H) 09.6 - 04.5 %   Platelets 507 (H) 150 - 400 K/uL   nRBC 0.6 (H) 0.0 - 0.2 %    Comment: Performed at [facility], [address], [place], [place] [address]  Comprehensive metabolic panel     Status: Abnormal   Collection Time: 06/12/23 11:33 PM  Result Value Ref Range   Sodium 134 (L) 135 - 145 mmol/L   Potassium 3.9 3.5 - 5.1 mmol/L   Chloride 93 (L) 98 - 111 mmol/L   CO2 26 22 - 32 mmol/L   Glucose, Bld 274 (H) 70 - 99 mg/dL    Comment: Glucose reference range applies only to samples taken after fasting for at least 8 hours.   BUN 18 8 - 23 mg/dL   Creatinine, Ser 1.91 0.44 - 1.00 mg/dL   Calcium 8.8 (L) 8.9 - 10.3 mg/dL   Total Protein 7.0 6.5 - 8.1 g/dL   Albumin 3.3 (L) 3.5 - 5.0 g/dL   AST 29 15 - 41 U/L   ALT 21 0 - 44 U/L   Alkaline Phosphatase 72 38 - 126 U/L   Total Bilirubin 0.4 <1.2 mg/dL   GFR, Estimated >47 >82 mL/min    Comment: (NOTE) Calculated using the CKD-EPI Creatinine Equation (2021)    Anion gap 15 5 - 15    Comment: Performed at [facility], [address], [place], [place] [address]  Troponin I (High Sensitivity)     Status: Abnormal   Collection Time: 06/12/23 11:33 PM  Result Value Ref Range   Troponin I (High Sensitivity) 130 (HH) <18 ng/L    Comment: CRITICAL RESULT CALLED TO, READ BACK BY AND VERIFIED WITH J.GLOSTER RN [phone] [id number] M. ALAMANO (NOTE) Elevated high sensitivity troponin I (hsTnI) values and significant  changes across serial measurements may suggest ACS but many other  chronic and acute conditions are known to elevate hsTnI results.  Refer to the Links section for chest pain algorithms and additional  guidance. Performed at [facility], [address], [place],  [place] [address]   Brain natriuretic peptide     Status: Abnormal   Collection Time: 06/12/23 11:33 PM  Result Value Ref Range   B Natriuretic Peptide 502.9 (H) 0.0 - 100.0 pg/mL    Comment: Performed at [facility], [address], [place], [place] [address]  Protime-INR     Status: None   Collection Time: 06/12/23 11:33 PM  Result Value Ref Range   Prothrombin Time 13.0 11.4 - 15.2 seconds   INR 1.0 0.8 - 1.2    Comment: (NOTE) INR goal varies based on device and disease states. Performed at [facility], [address], [place], [place] [address]   I-Stat venous blood gas, ED     Status: Abnormal   Collection Time: 06/12/23 11:53 PM  Result Value Ref Range   pH, Ven 7.332 7.25 - 7.43   pCO2, Ven 56.6 44 - 60 mmHg   pO2, Ven 33 32 - 45 mmHg   Bicarbonate 30.0 (H) 20.0 - 28.0 mmol/L   TCO2 32 22 - 32 mmol/L   O2 Saturation 57 %   Acid-Base Excess 3.0 (H) 0.0 - 2.0 mmol/L   Sodium 134 (L) 135 - 145 mmol/L   Potassium 3.8 3.5 - 5.1 mmol/L   Calcium, Ion 1.06 (L) 1.15 - 1.40 mmol/L   HCT 23.0 (L) 36.0 - 46.0 %   Hemoglobin 7.8 (L) 12.0 - 15.0 g/dL   Sample type VENOUS    Comment NOTIFIED PHYSICIAN   Prepare RBC (crossmatch)     Status: None   Collection Time: 06/13/23 12:20 AM  Result Value Ref Range   Order Confirmation      ORDER PROCESSED BY BLOOD BANK Performed at [facility], [address], [place], [place] [address]   Type and screen [facility]     Status: None (Preliminary result)   Collection Time: 06/13/23 12:40 AM  Result Value Ref Range   ABO/RH(D) O NEG    Antibody Screen NEG    Sample Expiration      06/16/2023,2359 Performed at [facility], [address] [place], [place] [address]    Unit Number [id number]    Blood Component Type RED CELLS,LR    Unit division 00    Status of Unit ISSUED    Transfusion Status OK TO TRANSFUSE    Crossmatch Result Compatible    Unit Number [id number]    Blood Component  Type RED CELLS,LR    Unit division 00    Status of Unit ALLOCATED    Transfusion Status OK TO TRANSFUSE    Crossmatch Result Compatible   Troponin I (High Sensitivity)     Status: Abnormal   Collection Time: 06/13/23  2:32 AM  Result Value Ref Range   Troponin I (High Sensitivity) 166 (HH) <18 ng/L    Comment: CRITICAL VALUE NOTED. VALUE IS CONSISTENT WITH PREVIOUSLY REPORTED/CALLED VALUE (NOTE) Elevated high sensitivity troponin I (hsTnI) values and significant  changes across serial measurements may suggest ACS but many other  chronic and acute conditions are known to elevate hsTnI results.  Refer to the "Links" section for chest pain algorithms and additional  guidance. Performed at [facility], [address] [place], [place] [address]   Resp panel by RT-PCR (RSV, Flu A&B, Covid) Anterior Nasal Swab     Status: None   Collection Time: 06/13/23  3:44 AM   Specimen: Anterior Nasal Swab  Result Value Ref Range   SARS Coronavirus 2 by RT PCR NEGATIVE NEGATIVE   Influenza A by PCR NEGATIVE NEGATIVE   Influenza B by PCR NEGATIVE NEGATIVE    Comment: (NOTE) The Xpert Xpress SARS-CoV-2/FLU/RSV plus assay is intended as an aid in the diagnosis of influenza from Nasopharyngeal swab specimens and should not be used as a sole basis for treatment. Nasal washings and aspirates are unacceptable for Xpert Xpress SARS-CoV-2/FLU/RSV testing.  Fact Sheet for Patients: BloggerCourse.com  Fact Sheet for Healthcare Providers: SeriousBroker.it  This test is not yet approved or cleared by the [place] FDA and has been authorized for detection and/or diagnosis  of SARS-CoV-2 by FDA under an Emergency Use Authorization (EUA). This EUA will remain in effect (meaning this test can be used) for the duration of the COVID-19 declaration under Section 564(b)(1) of the Act, 21 U.S.C. section 360bbb-3(b)(1), unless the authorization is  terminated or revoked.     Resp Syncytial Virus by PCR NEGATIVE NEGATIVE    Comment: (NOTE) Fact Sheet for Patients: BloggerCourse.com  Fact Sheet for Healthcare Providers: SeriousBroker.it  This test is not yet approved or cleared by the [place] FDA and has been authorized for detection and/or diagnosis of SARS-CoV-2 by FDA under an Emergency Use Authorization (EUA). This EUA will remain in effect (meaning this test can be used) for the duration of the COVID-19 declaration under Section 564(b)(1) of the Act, 21 U.S.C. section 360bbb-3(b)(1), unless the authorization is terminated or revoked.  Performed at  Hospital Lab, [address], [place], [place] [address]   CBG monitoring, ED     Status: Abnormal   Collection Time: 06/13/23  5:01 AM  Result Value Ref Range   Glucose-Capillary 143 (H) 70 - 99 mg/dL    Comment: Glucose reference range applies only to samples taken after fasting for at least 8 hours.  Magnesium     Status: None   Collection Time: 06/13/23  5:55 AM  Result Value Ref Range   Magnesium 2.3 1.7 - 2.4 mg/dL    Comment: Performed at [facility], [address] [place], [place] [address]  Phosphorus     Status: None   Collection Time: 06/13/23  5:55 AM  Result Value Ref Range   Phosphorus 3.7 2.5 - 4.6 mg/dL    Comment: Performed at [facility], [address], [place], [place] [address]  Iron and TIBC     Status: Abnormal   Collection Time: 06/13/23  5:55 AM  Result Value Ref Range   Iron 96 28 - 170 ug/dL   TIBC 253 (H) 664 - 403 ug/dL   Saturation Ratios 18 10.4 - 31.8 %   UIBC 432 ug/dL    Comment: Performed at [facility], [address], [place], [place] [address]  Ferritin     Status: Abnormal   Collection Time: 06/13/23  5:55 AM  Result Value Ref Range   Ferritin 5 (L) 11 - 307 ng/mL    Comment: Performed at [facility], [address] [place], [place]  [address]  Procalcitonin     Status: None   Collection Time: 06/13/23  5:55 AM  Result Value Ref Range   Procalcitonin 0.20 ng/mL    Comment:        Interpretation: PCT (Procalcitonin) <= 0.5 ng/mL: Systemic infection (sepsis) is not likely. Local bacterial infection is possible. (NOTE)       Sepsis PCT Algorithm           Lower Respiratory Tract                                      Infection PCT Algorithm    ----------------------------     ----------------------------         PCT < 0.25 ng/mL                PCT < 0.10 ng/mL          Strongly encourage  Strongly discourage   discontinuation of antibiotics    initiation of antibiotics    ----------------------------     -----------------------------       PCT 0.25 - 0.50 ng/mL            PCT 0.10 - 0.25 ng/mL               OR       >80% decrease in PCT            Discourage initiation of                                            antibiotics      Encourage discontinuation           of antibiotics    ----------------------------     -----------------------------         PCT >= 0.50 ng/mL              PCT 0.26 - 0.50 ng/mL               AND        <80% decrease in PCT             Encourage initiation of                                             antibiotics       Encourage continuation           of antibiotics    ----------------------------     -----------------------------        PCT >= 0.50 ng/mL                  PCT > 0.50 ng/mL               AND         increase in PCT                  Strongly encourage                                      initiation of antibiotics    Strongly encourage escalation           of antibiotics                                     -----------------------------                                           PCT <= 0.25 ng/mL                                                 OR                                        >  80% decrease in PCT                                      Discontinue / Do  not initiate                                             antibiotics  Performed at [facility], [address], [place], [place] [address]   Lipid panel     Status: None   Collection Time: 06/13/23  5:55 AM  Result Value Ref Range   Cholesterol 104 0 - 200 mg/dL   Triglycerides 15 <732 mg/dL   HDL 65 >20 mg/dL   Total CHOL/HDL Ratio 1.6 RATIO   VLDL 3 0 - 40 mg/dL   LDL Cholesterol 36 0 - 99 mg/dL    Comment:        Total Cholesterol/HDL:CHD Risk Coronary Heart Disease Risk Table                     Men   Women  1/2 Average Risk   3.4   3.3  Average Risk       5.0   4.4  2 X Average Risk   9.6   7.1  3 X Average Risk  23.4   11.0        Use the calculated Patient Ratio above and the CHD Risk Table to determine the patient's CHD Risk.        ATP III CLASSIFICATION (LDL):  <100     mg/dL   Optimal  254-270  mg/dL   Near or Above                    Optimal  130-159  mg/dL   Borderline  623-762  mg/dL   High  >831     mg/dL   Very High Performed at [facility], [address], [place], [place] [address]   TSH     Status: None   Collection Time: 06/13/23  5:55 AM  Result Value Ref Range   TSH 0.545 0.350 - 4.500 uIU/mL    Comment: Performed by a 3rd Generation assay with a functional sensitivity of <=0.01 uIU/mL. Performed at [facility], [address] [place], [place] [address]   T4, free     Status: Abnormal   Collection Time: 06/13/23  5:55 AM  Result Value Ref Range   Free T4 1.21 (H) 0.61 - 1.12 ng/dL    Comment: (NOTE) Biotin ingestion may interfere with free T4 tests. If the results are inconsistent with the TSH level, previous test results, or the clinical presentation, then consider biotin interference. If needed, order repeat testing after stopping biotin. Performed at [facility], [address], [place], [place] [address]   CBC     Status: Abnormal   Collection Time: 06/13/23  5:55 AM  Result Value Ref Range   WBC 18.1  (H) 4.0 - 10.5 K/uL   RBC 2.69 (L) 3.87 - 5.11 MIL/uL   Hemoglobin 7.2 (L) 12.0 - 15.0 g/dL   HCT 94.8 (L) 54.6 - 27.0 %   MCV 86.2 80.0 - 100.0 fL   MCH 26.8 26.0 - 34.0 pg   MCHC 31.0 30.0 -  36.0 g/dL   RDW 16.1 (H) 09.6 - 04.5 %   Platelets 358 150 - 400 K/uL   nRBC 0.4 (H) 0.0 - 0.2 %    Comment: Performed at [facility], [address], [place], [place] [address]  Basic metabolic panel     Status: Abnormal   Collection Time: 06/13/23  5:55 AM  Result Value Ref Range   Sodium 135 135 - 145 mmol/L   Potassium 4.4 3.5 - 5.1 mmol/L   Chloride 96 (L) 98 - 111 mmol/L   CO2 27 22 - 32 mmol/L   Glucose, Bld 121 (H) 70 - 99 mg/dL    Comment: Glucose reference range applies only to samples taken after fasting for at least 8 hours.   BUN 18 8 - 23 mg/dL   Creatinine, Ser 1.91 0.44 - 1.00 mg/dL   Calcium 9.8 8.9 - 47.8 mg/dL   GFR, Estimated >29 >56 mL/min    Comment: (NOTE) Calculated using the CKD-EPI Creatinine Equation (2021)    Anion gap 12 5 - 15    Comment: Performed at [facility], [address] [place], [place] [address]  Troponin I (High Sensitivity)     Status: Abnormal   Collection Time: 06/13/23  5:55 AM  Result Value Ref Range   Troponin I (High Sensitivity) 242 (HH) <18 ng/L    Comment: CRITICAL VALUE NOTED. VALUE IS CONSISTENT WITH PREVIOUSLY REPORTED/CALLED VALUE (NOTE) Elevated high sensitivity troponin I (hsTnI) values and significant  changes across serial measurements may suggest ACS but many other  chronic and acute conditions are known to elevate hsTnI results.  Refer to the "Links" section for chest pain algorithms and additional  guidance. Performed at [facility], [address] [place], [place] [address]   Prepare RBC (crossmatch)     Status: None   Collection Time: 06/13/23  9:30 AM  Result Value Ref Range   Order Confirmation      ORDER PROCESSED BY BLOOD BANK Performed at [facility], [address],  [place], [place] [address]    CT Angio Chest Pulmonary Embolism (PE) W or WO Contrast Result Date: 06/13/2023 CLINICAL DATA:  Pulmonary embolism (PE) suspected, high prob Respiratory distress. EXAM: CT ANGIOGRAPHY CHEST WITH CONTRAST TECHNIQUE: Multidetector CT imaging of the chest was performed using the standard protocol during bolus administration of intravenous contrast. Multiplanar CT image reconstructions and MIPs were obtained to evaluate the vascular anatomy. RADIATION DOSE REDUCTION: This exam was performed according to the departmental dose-optimization program which includes automated exposure control, adjustment of the mA and/or kV according to patient size and/or use of iterative reconstruction technique. CONTRAST:  50mL OMNIPAQUE IOHEXOL 350 MG/ML SOLN COMPARISON:  Radiograph yesterday.  Chest CT 10/13/2021 FINDINGS: Cardiovascular: Heart is normal in size. There are no filling defects within the pulmonary arteries to suggest pulmonary embolus. Advanced calcified noncalcified atheromatous plaque throughout the thoracic aorta. This includes irregular plaque throughout the descending aorta. No aneurysm or acute aortic findings. 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 There are coronary artery calcifications. No pericardial effusion. Mediastinum/Nodes: Enlarged 14 mm right hilar lymph node no enlarged mediastinal nodes. No thyroid nodule. Small hiatal hernia. Lungs/Pleura: Moderate emphysema. Moderate bronchial thickening. Areas of interstitial and septal thickening primarily involving the lung bases and anterior lungs. Minimal consolidation dependently in the lower lobes. 3 mm right upper lobe nodule series 6, image 53, unchanged. Perifissural 7 mm right lower lobe nodule series 6,  image 95, unchanged. One of the right lower lobe nodules on prior has resolved, 1 of the right lower lobe nodules on prior is obscured on the current exam. There are in new ill-defined nodular opacities  in the left upper lobe series 6, image 32 measuring 9 mm medially and 6 mm laterally. Ill-defined left upper lobe opacity series 6, image 51, new. Punctate benign granuloma in the left upper lobe. No significant pleural effusion. Upper Abdomen: No acute upper abdominal findings. Musculoskeletal: Mild scoliosis and thoracic spondylosis. There are no acute or suspicious osseous abnormalities. Review of the MIP images confirms the above findings. IMPRESSION: 1. No pulmonary embolus. 2. Areas of interstitial and septal thickening primarily involving the lung bases and anterior lungs, suspicious for pulmonary edema. Atypical infection could have a similar appearance. 3. Minimal consolidation/atelectasis dependently in the lower lobes. 4. New ill-defined nodular opacities in the left upper lobe measuring up to 9 mm, likely infectious or inflammatory. Recommend CT follow-up in 3 months. 5. Many of the right lung pulmonary nodules on prior exam are unchanged. At least 1 of the previous right lung nodules has resolved. No specific follow-up of these nodules is needed. 6. Enlarged right hilar lymph node, likely reactive. 7. Moderate emphysema. Aortic Atherosclerosis (ICD10-I70.0) and Emphysema (ICD10-J43.9). Electronically Signed   By: Narda Rutherford M.D.   On: 06/13/2023 02:45   DG Chest Port 1 View Result Date: 06/12/2023 CLINICAL DATA:  Short of breath EXAM: PORTABLE CHEST 1 VIEW COMPARISON:  10/13/2021 FINDINGS: Single frontal view of the chest demonstrates a stable cardiac silhouette. Diffuse interstitial opacities are seen throughout the lungs, with patchy bibasilar ground-glass airspace disease. No effusion or pneumothorax. No acute bony abnormalities. IMPRESSION: 1. Diffuse interstitial prominence with patchy bibasilar ground-glass airspace disease, which may reflect pulmonary edema or atypical infection such as viral pneumonitis. Electronically Signed   By: Sharlet Salina M.D.   On: 06/12/2023 23:55     Pending Labs Unresulted Labs (From admission, onward)     Start     Ordered   06/13/23 0600  Hemoglobin and hematocrit, blood  Now then every 6 hours,   R (with STAT occurrences)      06/13/23 0444   06/13/23 0443  Hemoglobin A1c  Once,   URGENT       Comments: To assess prior glycemic control    06/13/23 0442   06/13/23 0438  Respiratory (~20 pathogens) panel by PCR  (Respiratory panel by PCR (~20 pathogens, ~24 hr TAT)  w precautions)  Once,   URGENT        06/13/23 0437   06/13/23 0438  MRSA Next Gen by PCR, Nasal  Once,   URGENT        06/13/23 0437   06/13/23 0351  Legionella Pneumophila Serogp 1 Ur Ag  Once,   URGENT        06/13/23 0350   06/13/23 0351  Strep pneumoniae urinary antigen  Once,   URGENT        06/13/23 0350   06/13/23 0351  Culture, Respiratory w Gram Stain  Once,   URGENT        06/13/23 0350            Vitals/Pain Today's Vitals   06/13/23 0700 06/13/23 0730 06/13/23 0800 06/13/23 0815  BP: 102/79 112/81 (!) 110/90 127/80  Pulse: (!) 109 (!) 108 65 (!) 110  Resp: 19 17 12 19   Temp:      TempSrc:  SpO2: 98% 98% 98% 99%  Weight:      Height:        Isolation Precautions Droplet precaution  Medications Medications  0.9 %  sodium chloride infusion (Manually program via Guardrails IV Fluids) ( Intravenous Not Given 06/13/23 0724)  norepinephrine (LEVOPHED) 4mg  in (0.016 mg/mL) premix infusion (0 mcg/min Intravenous Hold 06/13/23 0419)  fluticasone furoate-vilanterol (BREO ELLIPTA) 200-25 MCG/ACT 1 puff (1 puff Inhalation Not Given 06/13/23 0852)  insulin aspart (novoLOG) injection 0-9 Units (1 Units Subcutaneous Given 06/13/23 0521)  midodrine (PROAMATINE) tablet 5 mg (5 mg Oral Given 06/13/23 0517)  azithromycin (ZITHROMAX) 500 mg in sodium chloride 0.9 % 250 mL IVPB (has no administration in time range)  levalbuterol (XOPENEX) nebulizer solution 0.63 mg (has no administration in time range)  ipratropium (ATROVENT) nebulizer  solution 0.5 mg (0.5 mg Nebulization Not Given 06/13/23 0854)  docusate sodium (COLACE) capsule 100 mg (has no administration in time range)  polyethylene glycol (MIRALAX / GLYCOLAX) packet 17 g (has no administration in time range)  pantoprazole (PROTONIX) injection 40 mg (40 mg Intravenous Given 06/13/23 0514)    Followed by  pantoprazole (PROTONIX) injection 40 mg (has no administration in time range)  atorvastatin (LIPITOR) tablet 80 mg (has no administration in time range)  levothyroxine (SYNTHROID) tablet 75 mcg (has no administration in time range)  pramipexole (MIRAPEX) tablet 0.25 mg (has no administration in time range)  0.9 %  sodium chloride infusion (Manually program via Guardrails IV Fluids) (has no administration in time range)  morphine (PF) 2 MG/ML injection 1 mg (has no administration in time range)  ipratropium-albuterol (DUONEB) 0.5-2.5 (3) MG/3ML nebulizer solution 3 mL (3 mLs Nebulization Given 06/12/23 2339)  ipratropium-albuterol (DUONEB) 0.5-2.5 (3) MG/3ML nebulizer solution 3 mL (3 mLs Nebulization Given 06/12/23 2339)  fentaNYL (SUBLIMAZE) injection 50 mcg (50 mcg Intravenous Given 06/12/23 2345)  levofloxacin (LEVAQUIN) IVPB 750 mg (0 mg Intravenous Stopped 06/13/23 0318)  iohexol (OMNIPAQUE) 350 MG/ML injection 50 mL (50 mLs Intravenous Contrast Given 06/13/23 0218)  calcium gluconate 2 g/ 100 mL sodium chloride IVPB (0 mg Intravenous Stopped 06/13/23 9562)    Mobility Not sure of mobility at facility but she has not ambulated for [place].      Focused Assessments Pulmonary Assessment Handoff:  Lung sounds: L Breath Sounds: Diminished R Breath Sounds: Diminished O2 Device: Nasal Cannula O2 Flow Rate (L/min): 3 L/min    R Recommendations: See Admitting Provider Note  Report given to:   Additional Notes:

## 2023-06-13 NOTE — ED Notes (Signed)
Pt states has been feeling a little more tight with a little bit of wheeze and SOB since her CT so we decided to do a tx.

## 2023-06-13 NOTE — Progress Notes (Signed)
06/13/2023 Poked her ear with something and had some bleeding into external canal that has since stopped.  However she cannot hear well due to retained clot in the canal (examined).  Will pass on to [facility] that may warrant ENT eval in AM if still an issue.  Myrla Halsted MD PCCM

## 2023-06-13 NOTE — Progress Notes (Addendum)
NAME:  Amy Mckay, MRN:  [id number], DOB:  10-08-1934, LOS: 0 ADMISSION DATE:  06/12/2023, CONSULTATION DATE:  12/29 REFERRING MD:  Dr. Pilar Plate, CHIEF COMPLAINT:  Acute respiratory failure   History of Present Illness:  Patient is a 88 year old female with pertinent PMH COPD on 3L [place] at home, mesenteric ischemia s/p SMA stenting, chronic hyponatremia, CVA, chronic rectal prolapse, hypothyroidism, chronic dysphagia/esophageal dysmotility, chronic pain syndrome presents to [facility] ED on 12/29 with respiratory failure.  Patient last admitted on 01/2023 w/ proctitis. On 12/29 patient woke up from sleep having acute onset SOB. Patient also states having rectal pain and noted to have some blood when wiping over the past month. EMS called and patient noted to be hypoxic and had poor air movement.  Initially concerned for copd exacerbation and patient given IV steroids, mag, EpiPen, breathing treatments.  Transported to [facility] ED.  On arrival patient still in respiratory distress and placed on BiPAP.  BP initially stable. Afebrile and WBC 19.8.  CXR with diffuse bibasilar groundglass airspace disease which may reflect pulmonary edema versus atypical infection.  Patient given Levaquin.  CTA chest with no PE; areas of interstitial/septal thickening suspicious for pulmonary edema versus atypical infection; new nodular opacity in LUL measuring up to 9 mm likely infectious for inflammatory recommending CT follow-up in 3 months; many right lung pulmonary nodules unchanged.  VBG 7.33, 56, 33, 30.  BNP 502.  Troponin 130 then 166. While in ED patient became hypotensive. Hgb 6.3 and transfused 1 unit PRBCs. PCCM consulted for icu admission.  Pertinent  Medical History  COPD on home O2 mesenteric ischemia s/p SMA stenting chronic hyponatremia CVA chronic rectal prolapse Hypothyroidism chronic dysphagia/esophageal dysmotility  chronic pain syndrome  Significant Hospital Events: Including procedures, antibiotic start  and stop dates in addition to other pertinent events   12/29 admitted to [facility] with respiratory failure on BiPAP; also hypotensive on Levophed weaned off after blood   Interim History / Subjective:  Still having rectal discomfort superimposed on chronic back pain  Objective   Blood pressure 91/64, pulse (!) 111, temperature 99 F (37.2 C), temperature source Oral, resp. rate (!) 22, height 5\' 3"  (1.6 m), weight 59.9 kg, SpO2 98%.    FiO2 (%):  [40 %] 40 % PEEP:  [6 cmH20] 6 cmH20   Intake/Output Summary (Last 24 hours) at 06/13/2023 0727 Last data filed at 06/13/2023 0533 Gross per 24 hour  Intake 50 ml  Output --  Net 50 ml   Filed Weights   06/12/23 2340  Weight: 59.9 kg    Examination: General this is a chronically ill appearing 88 year old female. Not in acute distress but does report on-going subacute abd and rectal pain HENT NCAT no JVD  Pulm some wheezing. No accessory use  Card rrr Abd soft  Ext no sig edema warm strong pulses  Neuro awake and oriented no focal def    Resolved Hospital Problem list     Assessment & Plan:  Acute on chronic respiratory failure with hypoxia due to bilateral interstitial infiltrates w/ bibasilar airspace disease (treating as CAP w/ element of edema, favoring edema) Possible COPD exacerbation? Bnp 503, COVID and Flu neg,  Plan: Wean O2 for sats > 90% BIPAP PRN cont home breo; prn xopenex given tachycardia F/u pct, rvp, urine legionella/strep still (P) Day 1 azith  Am cxr, bnp and PCT on 12/30 Daily assessment for diuretics (hold for now)  ABLA w/ BRBPR  superimposed on Chronic rectal prolapse  and h/o mesenteric ischemia status post SMA stent follows w/ [facility] outpt Hgb dropped from BP 10.6 to 11.2 to 6.3 on admit now s/p 1 unit PRBC w/ approp hgb bump. I would also be concerned about ischemic colitis given hx  Plan: Cont NPO status  Bid PPI (got bolus in ER) hold rectal hydrocortisone for now Will ask GI to see  Holding her  Brilinta for now as well as ASA  Hypotension: presumed in setting of ABLA. BP remains boarderline in 90s but MAP w/in goal  Plan: F/u echo  Will transfuse second unit (w/ active concern for cardiac ischemia)  F/u lactate and cbc post-xfusion   NSTEMI w/ ST depression in lateral leads Trop 130-->166 Plan: Cont tele  Not on AC or asa given concern for acute bleeding  Resume statin when taking pos  Cards eval  Echo ordered   Hyperglycemia Plan: Ssi goal 140-180   Chronic hyponatremia Plan: Cont to monitor. She is close to baseline   History of CVA Plan: hold asa and brilinta for now in setting of ABLA resume statin when able to take PO  Hypothyroidism Plan: synthroid when able to take po   Chronic pain syndrome Plan: hold home oxy and gabapentin for now   Restless leg syndrome Plan: resume pramipexole when able to take po    Memory impairment Plan: resume namenda when able to take po Delirium precautions  LUL nodular opacity  -measuring up to 9 mm likely infectious for inflammatory  Plan: will need CT follow-up in 3 months oupt   DNR  Plan Will cont to respect wishes   Best Practice (right click and "Reselect all SmartList Selections" daily)   Diet/type: NPO DVT prophylaxis SCD Pressure ulcer(s): N/A GI prophylaxis: PPI Lines: N/A Foley:  N/A Code Status:  DNR Last date of multidisciplinary goals of care discussion [12/29 patient spoke w/ Dr. Lonzo Candy and me and states she would want to be DNR/DNI; attempted to call daughter and granddaughter over phone but no answer]  Critical care time 32 min

## 2023-06-13 NOTE — ED Notes (Signed)
ED TO INPATIENT HANDOFF REPORT  ED Nurse Name and Phone #: Alycia Rossetti [phone]  S Name/Age/Gender Amy Mckay 88 y.o. female Room/Bed: 007C/007C  Code Status   Code Status: Limited: Do not attempt resuscitation (DNR) -DNR-LIMITED -Do Not Intubate/DNI   Home/SNF/Other Home Patient oriented to: self, place, time, and situation Is this baseline? Yes   Triage Complete: Triage complete  Chief Complaint NSTEMI (non-ST elevated myocardial infarction) (HCC) [I21.4] Respiratory failure, acute (HCC) [J96.00]  Triage Note Per EMS, pt from [facility], woke up very SOB (sudden onset).  Initially O2 was 80% on 4L home O2, was able to speak in complete sentences.  Pt was moving very little O2.  Cm of bottom pain.  Gave epi pen, solumedrol, 1 duo neb and one regular neg, mag 2g.  Placed on Cpap.    20  RA AC   Allergies Allergies  Allergen Reactions   Penicillins Anaphylaxis, Swelling and Other (See Comments)   Penicillin G Hives    Level of Care/Admitting Diagnosis ED Disposition     ED Disposition  Admit   Condition  --   Comment  H[facility] Area: [facility] [[id number]]  Level of Care: Progressive [102]  Admit to Progressive based on following criteria: CARDIOVASCULAR & THORACIC of moderate stability with acute coronary syndrome symptoms/low risk myocardial infarction/hypertensive urgency/arrhythmias/heart failure potentially compromising stability and stable post cardiovascular intervention patients.  May admit patient to [facility] or [facility] if equivalent level of care is available:: No  Covid Evaluation: Confirmed COVID Negative  Diagnosis: Respiratory failure, acute [facility]) [[id number]]  Admitting Physician: Lorin Glass [[id number]]  Attending Physician: KEILEY, DILLASHAW [[id number]]  Certification:: I certify this patient will need inpatient services for at least 2 midnights          B Medical/Surgery History Past Medical History:   Diagnosis Date   Acid reflux    COPD (chronic obstructive pulmonary disease) (HCC)    Hypertension    Past Surgical History:  Procedure Laterality Date   BIOPSY  10/17/2021   Procedure: BIOPSY;  Surgeon: Charlott Rakes, MD;  Location: [facility];  Service: Gastroenterology;;   BIOPSY  01/29/2023   Procedure: BIOPSY;  Surgeon: Charlott Rakes, MD;  Location: [facility];  Service: Gastroenterology;;   COLONOSCOPY N/A 10/17/2021   Procedure: COLONOSCOPY;  Surgeon: Charlott Rakes, MD;  Location: [facility];  Service: Gastroenterology;  Laterality: N/A;   COLONOSCOPY WITH PROPOFOL N/A 10/18/2021   Procedure: COLONOSCOPY WITH PROPOFOL;  Surgeon: Kathi Der, MD;  Location: [facility];  Service: Gastroenterology;  Laterality: N/A;   ESOPHAGOGASTRODUODENOSCOPY N/A 10/17/2021   Procedure: ESOPHAGOGASTRODUODENOSCOPY (EGD);  Surgeon: Charlott Rakes, MD;  Location: [facility];  Service: Gastroenterology;  Laterality: N/A;   FLEXIBLE SIGMOIDOSCOPY N/A 01/29/2023   Procedure: FLEXIBLE SIGMOIDOSCOPY;  Surgeon: Charlott Rakes, MD;  Location: [facility];  Service: Gastroenterology;  Laterality: N/A;   POLYPECTOMY  10/18/2021   Procedure: POLYPECTOMY;  Surgeon: Kathi Der, MD;  Location: [facility];  Service: Gastroenterology;;     A IV Location/Drains/Wounds Patient Lines/Drains/Airways Status     Active Line/Drains/Airways     Name Placement date Placement time Site Days   Peripheral IV 06/12/23 Anterior;Proximal;Right Antecubital 06/12/23  2337  Antecubital  1   Peripheral IV 06/13/23 20 G Anterior;Left Forearm 06/13/23  0120  Forearm  less than 1            Intake/Output Last 24 hours  Intake/Output Summary (Last 24 hours) at 06/13/2023 2026 Last data filed  at 06/13/2023 1700 Gross per 24 hour  Intake 356 ml  Output --  Net 356 ml    Labs/Imaging Results for orders placed or performed during the hospital encounter of 06/12/23 (from the past 48 hours)   CBC     Status: Abnormal   Collection Time: 06/12/23 11:33 PM  Result Value Ref Range   WBC 19.8 (H) 4.0 - 10.5 K/uL   RBC 2.55 (L) 3.87 - 5.11 MIL/uL   Hemoglobin 6.3 (LL) 12.0 - 15.0 g/dL    Comment: REPEATED TO VERIFY THIS CRITICAL RESULT HAS VERIFIED AND BEEN CALLED TO J.GLOSTER RN BY GLENDA GANADEN ON 12 29 2024 AT 0006, AND HAS BEEN READ BACK.     HCT 22.3 (L) 36.0 - 46.0 %   MCV 87.5 80.0 - 100.0 fL   MCH 24.7 (L) 26.0 - 34.0 pg   MCHC 28.3 (L) 30.0 - 36.0 g/dL   RDW 09.8 (H) 11.9 - 14.7 %   Platelets 507 (H) 150 - 400 K/uL   nRBC 0.6 (H) 0.0 - 0.2 %    Comment: Performed at [facility], [address], [place], [place] [address]  Comprehensive metabolic panel     Status: Abnormal   Collection Time: 06/12/23 11:33 PM  Result Value Ref Range   Sodium 134 (L) 135 - 145 mmol/L   Potassium 3.9 3.5 - 5.1 mmol/L   Chloride 93 (L) 98 - 111 mmol/L   CO2 26 22 - 32 mmol/L   Glucose, Bld 274 (H) 70 - 99 mg/dL    Comment: Glucose reference range applies only to samples taken after fasting for at least 8 hours.   BUN 18 8 - 23 mg/dL   Creatinine, Ser 2.13 0.44 - 1.00 mg/dL   Calcium 8.8 (L) 8.9 - 10.3 mg/dL   Total Protein 7.0 6.5 - 8.1 g/dL   Albumin 3.3 (L) 3.5 - 5.0 g/dL   AST 29 15 - 41 U/L   ALT 21 0 - 44 U/L   Alkaline Phosphatase 72 38 - 126 U/L   Total Bilirubin 0.4 <1.2 mg/dL   GFR, Estimated >08 >65 mL/min    Comment: (NOTE) Calculated using the CKD-EPI Creatinine Equation (2021)    Anion gap 15 5 - 15    Comment: Performed at [facility], [address], [place], [place] [address]  Troponin I (High Sensitivity)     Status: Abnormal   Collection Time: 06/12/23 11:33 PM  Result Value Ref Range   Troponin I (High Sensitivity) 130 (HH) <18 ng/L    Comment: CRITICAL RESULT CALLED TO, READ BACK BY AND VERIFIED WITH J.GLOSTER RN [phone] M. ALAMANO (NOTE) Elevated high sensitivity troponin I (hsTnI) values and significant  changes across serial  measurements may suggest ACS but many other  chronic and acute conditions are known to elevate hsTnI results.  Refer to the Links section for chest pain algorithms and additional  guidance. Performed at [facility], [address] [place], [place] [address]   Brain natriuretic peptide     Status: Abnormal   Collection Time: 06/12/23 11:33 PM  Result Value Ref Range   B Natriuretic Peptide 502.9 (H) 0.0 - 100.0 pg/mL    Comment: Performed at [facility], [address] [place], [place] [address]  Protime-INR     Status: None   Collection Time: 06/12/23 11:33 PM  Result Value Ref Range   Prothrombin Time 13.0 11.4 - 15.2 seconds  INR 1.0 0.8 - 1.2    Comment: (NOTE) INR goal varies based on device and disease states. Performed at [facility], [address] [place], [place] [address]   I-Stat venous blood gas, ED     Status: Abnormal   Collection Time: 06/12/23 11:53 PM  Result Value Ref Range   pH, Ven 7.332 7.25 - 7.43   pCO2, Ven 56.6 44 - 60 mmHg   pO2, Ven 33 32 - 45 mmHg   Bicarbonate 30.0 (H) 20.0 - 28.0 mmol/L   TCO2 32 22 - 32 mmol/L   O2 Saturation 57 %   Acid-Base Excess 3.0 (H) 0.0 - 2.0 mmol/L   Sodium 134 (L) 135 - 145 mmol/L   Potassium 3.8 3.5 - 5.1 mmol/L   Calcium, Ion 1.06 (L) 1.15 - 1.40 mmol/L   HCT 23.0 (L) 36.0 - 46.0 %   Hemoglobin 7.8 (L) 12.0 - 15.0 g/dL   Sample type VENOUS    Comment NOTIFIED PHYSICIAN   Prepare RBC (crossmatch)     Status: None   Collection Time: 06/13/23 12:20 AM  Result Value Ref Range   Order Confirmation      ORDER PROCESSED BY BLOOD BANK Performed at [facility], [address] [place], [place] [address]   Type and screen [facility]     Status: None (Preliminary result)   Collection Time: 06/13/23 12:40 AM  Result Value Ref Range   ABO/RH(D) O NEG    Antibody Screen NEG    Sample Expiration 06/16/2023,2359    Unit Number [id number]    Blood Component Type RED  CELLS,LR    Unit division 00    Status of Unit ISSUED    Transfusion Status OK TO TRANSFUSE    Crossmatch Result Compatible    Unit Number [id number]    Blood Component Type RED CELLS,LR    Unit division 00    Status of Unit ISSUED    Transfusion Status OK TO TRANSFUSE    Crossmatch Result      Compatible Performed at [facility], [address], [place], [place] [address]   Troponin I (High Sensitivity)     Status: Abnormal   Collection Time: 06/13/23  2:32 AM  Result Value Ref Range   Troponin I (High Sensitivity) 166 (HH) <18 ng/L    Comment: CRITICAL VALUE NOTED. VALUE IS CONSISTENT WITH PREVIOUSLY REPORTED/CALLED VALUE (NOTE) Elevated high sensitivity troponin I (hsTnI) values and significant  changes across serial measurements may suggest ACS but many other  chronic and acute conditions are known to elevate hsTnI results.  Refer to the "Links" section for chest pain algorithms and additional  guidance. Performed at [facility], [address], [place], [place] [address]   Resp panel by RT-PCR (RSV, Flu A&B, Covid) Anterior Nasal Swab     Status: None   Collection Time: 06/13/23  3:44 AM   Specimen: Anterior Nasal Swab  Result Value Ref Range   SARS Coronavirus 2 by RT PCR NEGATIVE NEGATIVE   Influenza A by PCR NEGATIVE NEGATIVE   Influenza B by PCR NEGATIVE NEGATIVE    Comment: (NOTE) The Xpert Xpress SARS-CoV-2/FLU/RSV plus assay is intended as an aid in the diagnosis of influenza from Nasopharyngeal swab specimens and should not be used as a sole basis for treatment. Nasal washings and aspirates are unacceptable for Xpert Xpress SARS-CoV-2/FLU/RSV testing.  Fact Sheet for Patients: BloggerCourse.com  Fact Sheet for Healthcare Providers:  SeriousBroker.it  This test is not yet approved or cleared by the [place] and has been authorized for detection and/or diagnosis of SARS-CoV-2 by FDA  under an Emergency Use Authorization (EUA). This EUA will remain in effect (meaning this test can be used) for the duration of the COVID-19 declaration under Section 564(b)(1) of the Act, 21 U.S.C. section 360bbb-3(b)(1), unless the authorization is terminated or revoked.     Resp Syncytial Virus by PCR NEGATIVE NEGATIVE    Comment: (NOTE) Fact Sheet for Patients: BloggerCourse.com  Fact Sheet for Healthcare Providers: SeriousBroker.it  This test is not yet approved or cleared by the [place] FDA and has been authorized for detection and/or diagnosis of SARS-CoV-2 by FDA under an Emergency Use Authorization (EUA). This EUA will remain in effect (meaning this test can be used) for the duration of the COVID-19 declaration under Section 564(b)(1) of the Act, 21 U.S.C. section 360bbb-3(b)(1), unless the authorization is terminated or revoked.  Performed at [facility], [address] [place], [place] [address]   CBG monitoring, ED     Status: Abnormal   Collection Time: 06/13/23  5:01 AM  Result Value Ref Range   Glucose-Capillary 143 (H) 70 - 99 mg/dL    Comment: Glucose reference range applies only to samples taken after fasting for at least 8 hours.  Magnesium     Status: None   Collection Time: 06/13/23  5:55 AM  Result Value Ref Range   Magnesium 2.3 1.7 - 2.4 mg/dL    Comment: Performed at [facility], [address], [place], [place] [address]  Phosphorus     Status: None   Collection Time: 06/13/23  5:55 AM  Result Value Ref Range   Phosphorus 3.7 2.5 - 4.6 mg/dL    Comment: Performed at [facility], [address], [place], [place] [address]  Iron and TIBC     Status: Abnormal   Collection Time: 06/13/23  5:55 AM  Result Value Ref Range   Iron 96 28 - 170 ug/dL   TIBC 914 (H) 782 - 956 ug/dL   Saturation Ratios 18 10.4 - 31.8 %   UIBC 432 ug/dL    Comment: Performed at [facility]  Lab, [address], [place], [place] [address]  Ferritin     Status: Abnormal   Collection Time: 06/13/23  5:55 AM  Result Value Ref Range   Ferritin 5 (L) 11 - 307 ng/mL    Comment: Performed at [facility], [address] [place], [place] [address]  Procalcitonin     Status: None   Collection Time: 06/13/23  5:55 AM  Result Value Ref Range   Procalcitonin 0.20 ng/mL    Comment:        Interpretation: PCT (Procalcitonin) <= 0.5 ng/mL: Systemic infection (sepsis) is not likely. Local bacterial infection is possible. (NOTE)       Sepsis PCT Algorithm           Lower Respiratory Tract                                      Infection PCT Algorithm    ----------------------------     ----------------------------         PCT < 0.25 ng/mL                PCT < 0.10 ng/mL  Strongly encourage             Strongly discourage   discontinuation of antibiotics    initiation of antibiotics    ----------------------------     -----------------------------       PCT 0.25 - 0.50 ng/mL            PCT 0.10 - 0.25 ng/mL               OR       >80% decrease in PCT            Discourage initiation of                                            antibiotics      Encourage discontinuation           of antibiotics    ----------------------------     -----------------------------         PCT >= 0.50 ng/mL              PCT 0.26 - 0.50 ng/mL               AND        <80% decrease in PCT             Encourage initiation of                                             antibiotics       Encourage continuation           of antibiotics    ----------------------------     -----------------------------        PCT >= 0.50 ng/mL                  PCT > 0.50 ng/mL               AND         increase in PCT                  Strongly encourage                                      initiation of antibiotics    Strongly encourage escalation           of antibiotics                                      -----------------------------                                           PCT <= 0.25 ng/mL                                                 OR                                        >  80% decrease in PCT                                      Discontinue / Do not initiate                                             antibiotics  Performed at [facility], [address] [place], [place] [address]   Lipid panel     Status: None   Collection Time: 06/13/23  5:55 AM  Result Value Ref Range   Cholesterol 104 0 - 200 mg/dL   Triglycerides 15 <347 mg/dL   HDL 65 >42 mg/dL   Total CHOL/HDL Ratio 1.6 RATIO   VLDL 3 0 - 40 mg/dL   LDL Cholesterol 36 0 - 99 mg/dL    Comment:        Total Cholesterol/HDL:CHD Risk Coronary Heart Disease Risk Table                     Men   Women  1/2 Average Risk   3.4   3.3  Average Risk       5.0   4.4  2 X Average Risk   9.6   7.1  3 X Average Risk  23.4   11.0        Use the calculated Patient Ratio above and the CHD Risk Table to determine the patient's CHD Risk.        ATP III CLASSIFICATION (LDL):  <100     mg/dL   Optimal  595-638  mg/dL   Near or Above                    Optimal  130-159  mg/dL   Borderline  756-433  mg/dL   High  >295     mg/dL   Very High Performed at [facility], [address] [place], [place] [address]   TSH     Status: None   Collection Time: 06/13/23  5:55 AM  Result Value Ref Range   TSH 0.545 0.350 - 4.500 uIU/mL    Comment: Performed by a 3rd Generation assay with a functional sensitivity of <=0.01 uIU/mL. Performed at [facility], [address], [place], [place] [address]   T4, free     Status: Abnormal   Collection Time: 06/13/23  5:55 AM  Result Value Ref Range   Free T4 1.21 (H) 0.61 - 1.12 ng/dL    Comment: (NOTE) Biotin ingestion may interfere with free T4 tests. If the results are inconsistent with the TSH level, previous test results, or the clinical presentation, then consider  biotin interference. If needed, order repeat testing after stopping biotin. Performed at [facility], [address] [place], [place] [address]   CBC     Status: Abnormal   Collection Time: 06/13/23  5:55 AM  Result Value Ref Range   WBC 18.1 (H) 4.0 - 10.5 K/uL   RBC 2.69 (L) 3.87 - 5.11 MIL/uL   Hemoglobin 7.2 (L) 12.0 - 15.0 g/dL   HCT 09.3 (L) 23.5 - 57.3 %   MCV 86.2 80.0 - 100.0 fL   MCH 26.8 26.0 - 34.0 pg   MCHC 31.0 30.0 - 36.0  g/dL   RDW 96.0 (H) 45.4 - 09.8 %   Platelets 358 150 - 400 K/uL   nRBC 0.4 (H) 0.0 - 0.2 %    Comment: Performed at [facility], [address] [place], [place] [address]  Basic metabolic panel     Status: Abnormal   Collection Time: 06/13/23  5:55 AM  Result Value Ref Range   Sodium 135 135 - 145 mmol/L   Potassium 4.4 3.5 - 5.1 mmol/L   Chloride 96 (L) 98 - 111 mmol/L   CO2 27 22 - 32 mmol/L   Glucose, Bld 121 (H) 70 - 99 mg/dL    Comment: Glucose reference range applies only to samples taken after fasting for at least 8 hours.   BUN 18 8 - 23 mg/dL   Creatinine, Ser 7.82 0.44 - 1.00 mg/dL   Calcium 9.8 8.9 - 95.6 mg/dL   GFR, Estimated >21 >30 mL/min    Comment: (NOTE) Calculated using the CKD-EPI Creatinine Equation (2021)    Anion gap 12 5 - 15    Comment: Performed at [facility], [address] [place], [place] [address]  Troponin I (High Sensitivity)     Status: Abnormal   Collection Time: 06/13/23  5:55 AM  Result Value Ref Range   Troponin I (High Sensitivity) 242 (HH) <18 ng/L    Comment: CRITICAL VALUE NOTED. VALUE IS CONSISTENT WITH PREVIOUSLY REPORTED/CALLED VALUE (NOTE) Elevated high sensitivity troponin I (hsTnI) values and significant  changes across serial measurements may suggest ACS but many other  chronic and acute conditions are known to elevate hsTnI results.  Refer to the "Links" section for chest pain algorithms and additional  guidance. Performed at [facility], [address] [place], [place] [address]   Prepare RBC (crossmatch)     Status: None   Collection Time: 06/13/23  9:30 AM  Result Value Ref Range   Order Confirmation      ORDER PROCESSED BY BLOOD BANK Performed at [facility], [address], [place], [place] [address]   CBG monitoring, ED     Status: Abnormal   Collection Time: 06/13/23 11:38 AM  Result Value Ref Range   Glucose-Capillary 125 (H) 70 - 99 mg/dL    Comment: Glucose reference range applies only to samples taken after fasting for at least 8 hours.  Strep pneumoniae urinary antigen     Status: None   Collection Time: 06/13/23 11:43 AM  Result Value Ref Range   Strep Pneumo Urinary Antigen NEGATIVE NEGATIVE    Comment:        Infection due to S. pneumoniae cannot be absolutely ruled out since the antigen present may be below the detection limit of the test. Performed at [facility], [address], [place], [place] [address]   Hemoglobin and hematocrit, blood     Status: Abnormal   Collection Time: 06/13/23 11:43 AM  Result Value Ref Range   Hemoglobin 7.5 (L) 12.0 - 15.0 g/dL   HCT 01.0 (L) 27.2 - 53.6 %    Comment: Performed at [facility], [address], [place], [place] [address]  Lactic acid, plasma     Status: None   Collection Time: 06/13/23 11:43 AM  Result Value Ref Range   Lactic Acid, Venous 1.9 0.5 - 1.9 mmol/L    Comment: Performed at [facility], [address], [place], [place] [address]  Troponin I (High Sensitivity)  Status: Abnormal   Collection Time: 06/13/23 11:43 AM  Result Value Ref Range   Troponin I (High Sensitivity) 229 (HH) <18 ng/L    Comment: CRITICAL VALUE NOTED. VALUE IS CONSISTENT WITH PREVIOUSLY REPORTED/CALLED VALUE (NOTE) Elevated high sensitivity troponin I (hsTnI) values and significant  changes across serial measurements may suggest ACS but many other  chronic and acute conditions are known to elevate hsTnI results.  Refer to the "Links" section for  chest pain algorithms and additional  guidance. Performed at [facility], [address], [place], [place] [address]   CBG monitoring, ED     Status: Abnormal   Collection Time: 06/13/23  4:38 PM  Result Value Ref Range   Glucose-Capillary 113 (H) 70 - 99 mg/dL    Comment: Glucose reference range applies only to samples taken after fasting for at least 8 hours.  CBG monitoring, ED     Status: Abnormal   Collection Time: 06/13/23  7:46 PM  Result Value Ref Range   Glucose-Capillary 111 (H) 70 - 99 mg/dL    Comment: Glucose reference range applies only to samples taken after fasting for at least 8 hours.   CT ABDOMEN PELVIS W CONTRAST Result Date: 06/13/2023 CLINICAL DATA:  Acute onset shortness of breath and rectal pain and blood red blood per rectum EXAM: CT ABDOMEN AND PELVIS WITH CONTRAST TECHNIQUE: Multidetector CT imaging of the abdomen and pelvis was performed using the standard protocol following bolus administration of intravenous contrast. RADIATION DOSE REDUCTION: This exam was performed according to the departmental dose-optimization program which includes automated exposure control, adjustment of the mA and/or kV according to patient size and/or use of iterative reconstruction technique. CONTRAST:  75mL OMNIPAQUE IOHEXOL 350 MG/ML SOLN COMPARISON:  CT abdomen and pelvis dated 01/28/2023, earlier same day CTA chest FINDINGS: Lower chest: Diffuse interlobular septal thickening. Scattered subpleural ground-glass densities. Increased basilar right lower lobe consolidation. Similar minimal left basilar atelectasis/consolidation. Trace left pleural effusion. Partially imaged heart size is normal. Coronary calcifications. Hepatobiliary: No focal hepatic lesions. No intra or extrahepatic biliary ductal dilation. Normal gallbladder. Pancreas: Unchanged hypodensity in the pancreatic uncinate process measuring up to 1.1 cm (3:36) when measured similarly. No specific follow-up imaging  recommended. Spleen: Normal in size without focal abnormality. Adrenals/Urinary Tract: No adrenal nodules. No suspicious renal mass, calculi or hydronephrosis. Excreted contrast material within the urinary bladder. Stomach/Bowel: Small hiatal hernia. Normal appearance of the stomach. Increased mural thickening and mucosal hyperenhancement of the prolapsed rectum. No abnormal bowel dilation. Colonic diverticulosis without acute diverticulitis. Appendix is not discretely seen. Vascular/Lymphatic: Aortic atherosclerosis. Celiac artery stent is patent. 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 The narrow caliber of the stent precludes evaluation of the SMA origin. No enlarged abdominal or pelvic lymph nodes. Reproductive: No adnexal masses. Other: No free fluid, fluid collection, or free air. Musculoskeletal: No acute or abnormal lytic or blastic osseous lesions. Multilevel degenerative changes of the partially imaged thoracic and lumbar spine. Increased perianal soft tissue stranding. Small fat-containing paraumbilical hernia. IMPRESSION: 1. Increased mural thickening and mucosal hyperenhancement of the prolapsed rectum with increased perianal soft tissue stranding, consistent with proctitis. 2. Increased basilar right lower lobe consolidation, suspicious for atelectasis. 3. Diffuse interlobular septal thickening and scattered subpleural ground-glass densities, which may represent pulmonary edema. 4. Trace left pleural effusion. 5. Celiac artery stent is patent. 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 6. Aortic Atherosclerosis (ICD10-I70.0). Coronary artery calcifications. Assessment for potential risk  factor modification, dietary therapy or pharmacologic therapy may be warranted, if clinically indicated. Electronically Signed   By: Agustin Cree M.D.   On: 06/13/2023 13:36   CT Angio Chest Pulmonary Embolism (PE) W or WO Contrast Result Date: 06/13/2023 CLINICAL  DATA:  Pulmonary embolism (PE) suspected, high prob Respiratory distress. EXAM: CT ANGIOGRAPHY CHEST WITH CONTRAST TECHNIQUE: Multidetector CT imaging of the chest was performed using the standard protocol during bolus administration of intravenous contrast. Multiplanar CT image reconstructions and MIPs were obtained to evaluate the vascular anatomy. RADIATION DOSE REDUCTION: This exam was performed according to the departmental dose-optimization program which includes automated exposure control, adjustment of the mA and/or kV according to patient size and/or use of iterative reconstruction technique. CONTRAST:  50mL OMNIPAQUE IOHEXOL 350 MG/ML SOLN COMPARISON:  Radiograph yesterday.  Chest CT 10/13/2021 FINDINGS: Cardiovascular: Heart is normal in size. There are no filling defects within the pulmonary arteries to suggest pulmonary embolus. Advanced calcified noncalcified atheromatous plaque throughout the thoracic aorta. This includes irregular plaque throughout the descending aorta. No aneurysm or acute aortic findings. 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 There are coronary artery calcifications. No pericardial effusion. Mediastinum/Nodes: Enlarged 14 mm right hilar lymph node no enlarged mediastinal nodes. No thyroid nodule. Small hiatal hernia. Lungs/Pleura: Moderate emphysema. Moderate bronchial thickening. Areas of interstitial and septal thickening primarily involving the lung bases and anterior lungs. Minimal consolidation dependently in the lower lobes. 3 mm right upper lobe nodule series 6, image 53, unchanged. Perifissural 7 mm right lower lobe nodule series 6, image 95, unchanged. One of the right lower lobe nodules on prior has resolved, 1 of the right lower lobe nodules on prior is obscured on the current exam. There are in new ill-defined nodular opacities in the left upper lobe series 6, image 32 measuring 9 mm medially and 6 mm laterally. Ill-defined left upper lobe  opacity series 6, image 51, new. Punctate benign granuloma in the left upper lobe. No significant pleural effusion. Upper Abdomen: No acute upper abdominal findings. Musculoskeletal: Mild scoliosis and thoracic spondylosis. There are no acute or suspicious osseous abnormalities. Review of the MIP images confirms the above findings. IMPRESSION: 1. No pulmonary embolus. 2. Areas of interstitial and septal thickening primarily involving the lung bases and anterior lungs, suspicious for pulmonary edema. Atypical infection could have a similar appearance. 3. Minimal consolidation/atelectasis dependently in the lower lobes. 4. New ill-defined nodular opacities in the left upper lobe measuring up to 9 mm, likely infectious or inflammatory. Recommend CT follow-up in 3 months. 5. Many of the right lung pulmonary nodules on prior exam are unchanged. At least 1 of the previous right lung nodules has resolved. No specific follow-up of these nodules is needed. 6. Enlarged right hilar lymph node, likely reactive. 7. Moderate emphysema. Aortic Atherosclerosis (ICD10-I70.0) and Emphysema (ICD10-J43.9). Electronically Signed   By: Narda Rutherford M.D.   On: 06/13/2023 02:45   DG Chest Port 1 View Result Date: 06/12/2023 CLINICAL DATA:  Short of breath EXAM: PORTABLE CHEST 1 VIEW COMPARISON:  10/13/2021 FINDINGS: Single frontal view of the chest demonstrates a stable cardiac silhouette. Diffuse interstitial opacities are seen throughout the lungs, with patchy bibasilar ground-glass airspace disease. No effusion or pneumothorax. No acute bony abnormalities. IMPRESSION: 1. Diffuse interstitial prominence with patchy bibasilar ground-glass airspace disease, which may reflect pulmonary edema or atypical infection such as viral pneumonitis. Electronically Signed   By: Sharlet Salina M.D.   On: 06/12/2023 23:55    Pending  Labs [employer] (From admission, onward)     Start     Ordered   06/13/23 1111  Lactic acid, plasma   (Lactic Acid)  STAT Now then every 3 hours,   R (with STAT occurrences)      06/13/23 1110   06/13/23 0600  Hemoglobin and hematocrit, blood  Now then every 6 hours,   R (with STAT occurrences)      06/13/23 0444   06/13/23 0443  Hemoglobin A1c  Once,   URGENT       Comments: To assess prior glycemic control    06/13/23 0442   06/13/23 0351  Legionella Pneumophila Serogp 1 Ur Ag  Once,   URGENT        06/13/23 0350   06/13/23 0351  Culture, Respiratory w Gram Stain  Once,   URGENT        06/13/23 0350            Vitals/Pain Today's Vitals   06/13/23 1930 06/13/23 1936 06/13/23 2015 06/13/23 2019  BP: (!) 149/95  (!) 140/98   Pulse: (!) 113  (!) 113   Resp: (!) 23  18   Temp:      TempSrc:      SpO2: 98%  99%   Weight:      Height:      PainSc:  8   10-Worst pain ever    Isolation Precautions Droplet precaution  Medications Medications  0.9 %  sodium chloride infusion (Manually program via Guardrails IV Fluids) ( Intravenous Not Given 06/13/23 0724)  norepinephrine (LEVOPHED) 4mg  in (0.016 mg/mL) premix infusion (0 mcg/min Intravenous Hold 06/13/23 0419)  fluticasone furoate-vilanterol (BREO ELLIPTA) 200-25 MCG/ACT 1 puff (1 puff Inhalation Not Given 06/13/23 0852)  insulin aspart (novoLOG) injection 0-9 Units ( Subcutaneous Not Given 06/13/23 1947)  midodrine (PROAMATINE) tablet 5 mg (5 mg Oral Given 06/13/23 1756)  azithromycin (ZITHROMAX) 500 mg in sodium chloride 0.9 % 250 mL IVPB (has no administration in time range)  levalbuterol (XOPENEX) nebulizer solution 0.63 mg (0.63 mg Nebulization Given 06/13/23 1430)  ipratropium (ATROVENT) nebulizer solution 0.5 mg (0.5 mg Nebulization Given 06/13/23 1942)  docusate sodium (COLACE) capsule 100 mg (has no administration in time range)  polyethylene glycol (MIRALAX / GLYCOLAX) packet 17 g (has no administration in time range)  pantoprazole (PROTONIX) injection 40 mg (40 mg Intravenous Given 06/13/23 0514)    Followed  by  pantoprazole (PROTONIX) injection 40 mg (40 mg Intravenous Given 06/13/23 1604)  atorvastatin (LIPITOR) tablet 80 mg (80 mg Oral Given 06/13/23 1114)  levothyroxine (SYNTHROID) tablet 75 mcg (75 mcg Oral Given 06/13/23 1115)  pramipexole (MIRAPEX) tablet 0.25 mg (0.25 mg Oral Given 06/13/23 1115)  morphine (PF) 2 MG/ML injection 2 mg (2 mg Intravenous Given 06/13/23 1942)  HYDROmorphone (DILAUDID) injection 0.5 mg (has no administration in time range)  ipratropium-albuterol (DUONEB) 0.5-2.5 (3) MG/3ML nebulizer solution 3 mL (3 mLs Nebulization Given 06/12/23 2339)  ipratropium-albuterol (DUONEB) 0.5-2.5 (3) MG/3ML nebulizer solution 3 mL (3 mLs Nebulization Given 06/12/23 2339)  fentaNYL (SUBLIMAZE) injection 50 mcg (50 mcg Intravenous Given 06/12/23 2345)  levofloxacin (LEVAQUIN) IVPB 750 mg (0 mg Intravenous Stopped 06/13/23 0318)  iohexol (OMNIPAQUE) 350 MG/ML injection 50 mL (50 mLs Intravenous Contrast Given 06/13/23 0218)  calcium gluconate 2 g/ 100 mL sodium chloride IVPB (0 mg Intravenous Stopped 06/13/23 0702)  0.9 %  sodium chloride infusion (Manually program via Guardrails IV Fluids) (0 mLs Intravenous Stopped 06/13/23 1941)  iohexol (OMNIPAQUE)  350 MG/ML injection 75 mL (75 mLs Intravenous Contrast Given 06/13/23 1301)    Mobility walks with device     Focused Assessments    R Recommendations: See Admitting Provider Note  Report given to:   Additional Notes: Friendly lady, but has chronic pain, biggest challenge is pain controlled. I got 0.5 of dilaudid from the hospitalist.

## 2023-06-13 NOTE — ED Notes (Signed)
Daughter Rayburn Ma [phone] would like an update on the patient

## 2023-06-13 NOTE — H&P (Addendum)
NAME:  Torsha Gills, MRN:  [id number], DOB:  [birth year], LOS: 0 ADMISSION DATE:  06/12/2023, CONSULTATION DATE:  12/29 REFERRING MD:  Dr. Pilar Plate, CHIEF COMPLAINT:  Acute respiratory failure   History of Present Illness:  Patient is a 88 year old female with pertinent PMH COPD on 3L [place] at home, mesenteric ischemia s/p SMA stenting, chronic hyponatremia, CVA, chronic rectal prolapse, hypothyroidism, chronic dysphagia/esophageal dysmotility, chronic pain syndrome presents to [facility] ED on 12/29 with respiratory failure.  Patient last admitted on 01/2023 w/ proctitis. On 12/29 patient woke up from sleep having acute onset SOB. Patient also states having rectal pain and noted to have some blood when wiping over the past month. EMS called and patient noted to be hypoxic and had poor air movement.  Initially concerned for copd exacerbation and patient given IV steroids, mag, EpiPen, breathing treatments.  Transported to [facility] ED.  On arrival patient still in respiratory distress and placed on BiPAP.  BP initially stable. Afebrile and WBC 19.8.  CXR with diffuse bibasilar groundglass airspace disease which may reflect pulmonary edema versus atypical infection.  Patient given Levaquin.  CTA chest with no PE; areas of interstitial/septal thickening suspicious for pulmonary edema versus atypical infection; new nodular opacity in LUL measuring up to 9 mm likely infectious for inflammatory recommending CT follow-up in 3 months; many right lung pulmonary nodules unchanged.  VBG 7.33, 56, 33, 30.  BNP 502.  Troponin 130 then 166. While in ED patient became hypotensive. Hgb 6.3 and transfused 1 unit PRBCs. PCCM consulted for icu admission.  Pertinent  Medical History  COPD on home O2 mesenteric ischemia s/p SMA stenting chronic hyponatremia CVA chronic rectal prolapse Hypothyroidism chronic dysphagia/esophageal dysmotility  chronic pain syndrome  Significant Hospital Events: Including procedures, antibiotic start  and stop dates in addition to other pertinent events   12/29 admitted to [facility] with respiratory failure on BiPAP; also hypotensive on Levophed.  Interim History / Subjective:  Patient on 3L [place] and no resp distress AOx3  Objective   Blood pressure (!) 84/58, pulse (!) 111, temperature 97.9 F (36.6 C), temperature source Axillary, resp. rate 13, height 5\' 3"  (1.6 m), weight 59.9 kg, SpO2 98%.    FiO2 (%):  [40 %] 40 % PEEP:  [6 cmH20] 6 cmH20  No intake or output data in the 24 hours ending 06/13/23 0340 Filed Weights   06/12/23 2340  Weight: 59.9 kg    Examination: General:  NAD HEENT: MM pink/moist; [place] in place Neuro: Aox3; MAE; sedated; CV: s1s2, tachy 110s, no m/r/g PULM:  dim clear BS bilaterally; [place] 3L sats 98% GI: soft, bsx4 active  Extremities: warm/dry, no edema  Skin: no rashes or lesions    Resolved Hospital Problem list     Assessment & Plan:  Acute on chronic respiratory failure with hypoxia Possible COPD exacerbation? Pulmonary edema vs. infiltrate -also could be from ABLA Plan: -weaned off bipap to 3L [place]; sat goal >92% -bipap as needed -breath sounds improved; will likely hold on resuming iv steroids -cont home breo; prn xopenex given tachycardia -will start on azithromycin empirically -check pct, rvp, urine legionella/strep -will hold on diuresis for now w/ hypotension  ABLA Chronic rectal prolapse: follows w/ [facility] outpt Plan: -transfused 1 unit; check H/H post transfusion -consider CT abd/pelvis -PPI bolus then bid -will need [facility] consult in am -NPO for now -hold rectal hydrocortisone for now  Hypotension: presumed in setting of ABLA Plan: -transfuse as above; trend cbc -map goal >65;  consider starting levo if remains hypotensive -start on midodrine -check echo -if wbc/fever curve trending up consider cultures and broaden abx -check pct  NSTEMI Plan: -trend trop -check echo -consider cards consult in  am  Hyperglycemia Plan: -ssi and cbg monitoring -a1c  Chronic hyponatremia Plan: -trend bmp  History of CVA Plan: -hold asa and brilinta for now in setting of ABLA -resume statin when able to take PO   History of mesenteric ischemia status post SMA stent Plan: -hold asa and brilinta in setting of ABLA   Hypothyroidism Plan: -resume synthroid when able to take po   Chronic pain syndrome Plan: -hold home oxy and gabapentin for now   Restless leg syndrome Plan: -resume pramipexole when able to take po    Memory impairment Plan: -resume namenda when able to take po  LUL nodular opacity  -measuring up to 9 mm likely infectious for inflammatory  Plan: -will need CT follow-up in 3 months oupt    Best Practice (right click and "Reselect all SmartList Selections" daily)   Diet/type: NPO DVT prophylaxis SCD Pressure ulcer(s): N/A GI prophylaxis: PPI Lines: N/A Foley:  N/A Code Status:  DNR Last date of multidisciplinary goals of care discussion [12/29 patient spoke w/ Dr. Lonzo Candy and me and states she would want to be DNR/DNI; attempted to call daughter and granddaughter over phone but no answer]  Labs   CBC: Recent Labs  Lab 06/12/23 2333 06/12/23 2353  WBC 19.8*  --   HGB 6.3* 7.8*  HCT 22.3* 23.0*  MCV 87.5  --   PLT 507*  --     Basic Metabolic Panel: Recent Labs  Lab 06/12/23 2333 06/12/23 2353  NA 134* 134*  K 3.9 3.8  CL 93*  --   CO2 26  --   GLUCOSE 274*  --   BUN 18  --   CREATININE 0.92  --   CALCIUM 8.8*  --    GFR: Estimated Creatinine Clearance: 36.3 mL/min (by C-G formula based on SCr of 0.92 mg/dL). Recent Labs  Lab 06/12/23 2333  WBC 19.8*    Liver Function Tests: Recent Labs  Lab 06/12/23 2333  AST 29  ALT 21  ALKPHOS 72  BILITOT 0.4  PROT 7.0  ALBUMIN 3.3*   No results for input(s): "LIPASE", "AMYLASE" in the last 168 hours. No results for input(s): "AMMONIA" in the last 168 hours.  ABG    Component  Value Date/Time   HCO3 30.0 (H) 06/12/2023 2353   TCO2 32 06/12/2023 2353   O2SAT 57 06/12/2023 2353     Coagulation Profile: Recent Labs  Lab 06/12/23 2333  INR 1.0    Cardiac Enzymes: No results for input(s): "CKTOTAL", "CKMB", "CKMBINDEX", "TROPONINI" in the last 168 hours.  HbA1C: Hgb A1c MFr Bld  Date/Time Value Ref Range Status  11/04/2022 10:17 AM 6.3 (H) 4.8 - 5.6 % Final    Comment:    (NOTE) Pre diabetes:          5.7%-6.4%  Diabetes:              >6.4%  Glycemic control for   <7.0% adults with diabetes     CBG: No results for input(s): "GLUCAP" in the last 168 hours.  Review of Systems:   Review of Systems  Constitutional:  Negative for fever.  Respiratory:  Positive for shortness of breath.   Cardiovascular:  Positive for chest pain.  Gastrointestinal:  Positive for abdominal pain. Negative for constipation, diarrhea, nausea and  vomiting.     Past Medical History:  She,  has a past medical history of Acid reflux, COPD (chronic obstructive pulmonary disease) (HCC), and Hypertension.   Surgical History:   Past Surgical History:  Procedure Laterality Date   BIOPSY  10/17/2021   Procedure: BIOPSY;  Surgeon: Charlott Rakes, MD;  Location: [facility];  Service: Gastroenterology;;   BIOPSY  01/29/2023   Procedure: BIOPSY;  Surgeon: Charlott Rakes, MD;  Location: [facility];  Service: Gastroenterology;;   COLONOSCOPY N/A 10/17/2021   Procedure: COLONOSCOPY;  Surgeon: Charlott Rakes, MD;  Location: [facility];  Service: Gastroenterology;  Laterality: N/A;   COLONOSCOPY WITH PROPOFOL N/A 10/18/2021   Procedure: COLONOSCOPY WITH PROPOFOL;  Surgeon: Kathi Der, MD;  Location: [facility];  Service: Gastroenterology;  Laterality: N/A;   ESOPHAGOGASTRODUODENOSCOPY N/A 10/17/2021   Procedure: ESOPHAGOGASTRODUODENOSCOPY (EGD);  Surgeon: Charlott Rakes, MD;  Location: [facility];  Service: Gastroenterology;  Laterality: N/A;   FLEXIBLE  SIGMOIDOSCOPY N/A 01/29/2023   Procedure: FLEXIBLE SIGMOIDOSCOPY;  Surgeon: Charlott Rakes, MD;  Location: [facility];  Service: Gastroenterology;  Laterality: N/A;   POLYPECTOMY  10/18/2021   Procedure: POLYPECTOMY;  Surgeon: Kathi Der, MD;  Location: [facility];  Service: Gastroenterology;;     Social History:   reports that she has been smoking cigarettes. She has never used smokeless tobacco. She reports that she does not drink alcohol and does not use drugs.   Family History:  Her family history is not on file.   Allergies Allergies  Allergen Reactions   Penicillins Anaphylaxis, Swelling and Other (See Comments)   Penicillin G Hives     Home Medications  Prior to Admission medications   Medication Sig Start Date End Date Taking? Authorizing Provider  acetaminophen (TYLENOL) 500 MG tablet Take 500 mg by mouth in the morning and at bedtime.    [provider]  albuterol (VENTOLIN HFA) 108 (90 Base) MCG/ACT inhaler Inhale 1-2 puffs into the lungs every 6 (six) hours as needed for wheezing or shortness of breath. Patient not taking: Reported on 01/29/2023    [provider]  aspirin EC 81 MG tablet Take 1 tablet (81 mg total) by mouth daily. Hold for 1 week. May resume 10/26/21 if remains stable with worsening anemia/GI bleeds Patient taking differently: Take 81 mg by mouth daily. 10/18/21   Alessandra Bevels, MD  atorvastatin (LIPITOR) 40 MG tablet Take 1 tablet (40 mg total) by mouth daily. 11/06/22   Almon Hercules, MD  BREO ELLIPTA 200-25 MCG/ACT AEPB Inhale 1 puff into the lungs daily. 11/03/22   [provider]  Calcium Carb-Cholecalciferol (CALCIUM + VITAMIN D3 PO) Take 1 tablet by mouth daily.    [provider]  ferrous gluconate (FERGON) 324 MG tablet Take 324 mg by mouth daily with breakfast.    [provider]  fluticasone (FLONASE) 50 MCG/ACT nasal spray Place 1 spray into both nostrils daily.    [provider]   gabapentin (NEURONTIN) 600 MG tablet Take 0.5 tablets (300 mg total) by mouth 3 (three) times daily. 01/30/23   Narda Bonds, MD  hydrocortisone (ANUSOL-HC) 25 MG suppository Place 1 suppository (25 mg total) rectally 2 (two) times daily. 10/18/21   Alessandra Bevels, MD  Ipratropium-Albuterol (COMBIVENT) 20-100 MCG/ACT AERS respimat Inhale 1 puff into the lungs 2 (two) times daily.    [provider]  levothyroxine (SYNTHROID, LEVOTHROID) 75 MCG tablet Take 75 mcg by mouth daily before breakfast. 02/05/16   [provider]  lidocaine (HM  LIDOCAINE PATCH) 4 % Place 1 patch onto the skin daily.    [provider]  memantine (NAMENDA) 10 MG tablet Take 10 mg by mouth 2 (two) times daily.    [provider]  Multiple Vitamin (MULTIVITAMIN WITH MINERALS) TABS tablet Take 1 tablet by mouth daily with breakfast.    [provider]  oxyCODONE (ROXICODONE) 15 MG immediate release tablet Take 1 tablet (15 mg total) by mouth daily as needed (Breakthrough pain). 01/30/23   Narda Bonds, MD  Oxycodone HCl 10 MG TABS Take 1 tablet (10 mg total) by mouth 4 (four) times daily as needed (Severe pain). 01/30/23   Narda Bonds, MD  pantoprazole (PROTONIX) 40 MG tablet Take 1 tablet (40 mg total) by mouth 2 (two) times daily. 11/12/22 11/12/23  Kathlen Mody, MD  polyethylene glycol (MIRALAX / GLYCOLAX) 17 g packet Take 17 g by mouth 2 (two) times daily as needed for mild constipation. 11/06/22   Almon Hercules, MD  pramipexole (MIRAPEX) 0.25 MG tablet Take 1 tablet (0.25 mg total) by mouth 2 (two) times daily. 01/30/23   Narda Bonds, MD  PSYLLIUM HUSK PO Take 0.4 g by mouth in the morning and at bedtime. Patient not taking: Reported on 04/26/2023    [provider]  senna-docusate (SENOKOT-S) 8.6-50 MG tablet Take 1 tablet by mouth 2 (two) times daily as needed for moderate constipation. 11/06/22   Almon Hercules, MD  ticagrelor (BRILINTA) 90 MG TABS tablet Take 1  tablet (90 mg total) by mouth 2 (two) times daily. 11/06/22   Almon Hercules, MD  tiZANidine (ZANAFLEX) 4 MG tablet Take 4 mg by mouth every 6 (six) hours as needed for muscle spasms.    [provider]  vitamin C (ASCORBIC ACID) 500 MG tablet Take 500 mg by mouth daily.    [provider]     Critical care time: 45 minutes    JD Daryel [facility] 06/13/2023, 3:40 AM  Please see [email or web address] for pager details.  From 7A-7P if no response, please call [phone]. After hours, please call ELink [phone].

## 2023-06-13 NOTE — Progress Notes (Addendum)
eLink Physician-Brief Progress Note Patient Name: Amy Mckay DOB: 14-Sep-1934 MRN: [id number]   Date of Service  06/13/2023  HPI/Events of Note  Pain  eICU Interventions     Continuing generalized pain despite morphine Will add Dilaudid And re eval      Massie Maroon 06/13/2023, 8:20 PM

## 2023-06-13 NOTE — Consult Note (Signed)
Referring Provider: Dr. Lonzo Candy Primary Care Physician:  Annita Brod, MD Primary Gastroenterologist:  Dr. [name]  Reason for Consultation:  Rectal bleeding; Rectal prolapse  HPI: Armani Attallah Karlsson is a 88 y.o. female with chronic rectal prolapse and chronic rectal pain and bleeding that she reports every time she wipes. She does not know if she has bleeding in her stool and her daughter who I talked to by phone is not sure but states that her mother has dementia. She lives in a SNF. Patient denies abdominal pain. Flex sig in August showed a large reducible rectal prolapse with rectal biopsies consistent with inflammation from prolapse. Patient admitted for respiratory failure and medically managed by CCM. Hgb 6.3 (11.2 in 8/24). On Brilinta. Bleeding noted from right ear and she is not sure how that started stating she hit her ear with something but does not know what. Colonoscopy in 5/23 where a small tubular adenoma was removed and showed left-sided diverticulosis and distal rectal inflammation. Daughter reports that patient was seen by surgery for her rectal prolapse but surgery was postponed until clearance by pulmonary and cardiology.  Past Medical History:  Diagnosis Date   Acid reflux    COPD (chronic obstructive pulmonary disease) (HCC)    Hypertension     Past Surgical History:  Procedure Laterality Date   BIOPSY  10/17/2021   Procedure: BIOPSY;  Surgeon: Charlott Rakes, MD;  Location: [facility];  Service: Gastroenterology;;   BIOPSY  01/29/2023   Procedure: BIOPSY;  Surgeon: Charlott Rakes, MD;  Location: [facility];  Service: Gastroenterology;;   COLONOSCOPY N/A 10/17/2021   Procedure: COLONOSCOPY;  Surgeon: Charlott Rakes, MD;  Location: [facility];  Service: Gastroenterology;  Laterality: N/A;   COLONOSCOPY WITH PROPOFOL N/A 10/18/2021   Procedure: COLONOSCOPY WITH PROPOFOL;  Surgeon: Kathi Der, MD;  Location: [facility];  Service: Gastroenterology;   Laterality: N/A;   ESOPHAGOGASTRODUODENOSCOPY N/A 10/17/2021   Procedure: ESOPHAGOGASTRODUODENOSCOPY (EGD);  Surgeon: Charlott Rakes, MD;  Location: [facility];  Service: Gastroenterology;  Laterality: N/A;   FLEXIBLE SIGMOIDOSCOPY N/A 01/29/2023   Procedure: FLEXIBLE SIGMOIDOSCOPY;  Surgeon: Charlott Rakes, MD;  Location: [facility];  Service: Gastroenterology;  Laterality: N/A;   POLYPECTOMY  10/18/2021   Procedure: POLYPECTOMY;  Surgeon: Kathi Der, MD;  Location: [facility];  Service: Gastroenterology;;    Prior to Admission medications   Medication Sig Start Date End Date Taking? Authorizing Provider  acetaminophen (TYLENOL) 500 MG tablet Take 1,000 mg by mouth in the morning, at noon, and at bedtime.   Yes [provider]  albuterol (VENTOLIN HFA) 108 (90 Base) MCG/ACT inhaler Inhale 2 puffs into the lungs every 6 (six) hours as needed for wheezing or shortness of breath.   Yes [provider]  Artificial Saliva (BIOTENE DRY MOUTH MOISTURIZING) SOLN Use as directed 1 Capful in the mouth or throat in the morning and at bedtime. Swish and spit   Yes [provider]  aspirin EC 81 MG tablet Take 81 mg by mouth daily. Swallow whole.   Yes [provider]  atorvastatin (LIPITOR) 40 MG tablet Take 1 tablet (40 mg total) by mouth daily. 11/06/22  Yes Gonfa, Boyce Medici, MD  BREO ELLIPTA 200-25 MCG/ACT AEPB Inhale 1 puff into the lungs daily. 11/03/22  Yes [provider]  Calcium Carb-Cholecalciferol (CALCIUM + VITAMIN D3 PO) Take 1 tablet by mouth daily.   Yes [provider]  DULoxetine (CYMBALTA) 60 MG capsule Take 60 mg by mouth daily. 06/11/23  Yes [provider]  fluticasone (FLONASE) 50 MCG/ACT nasal spray Place 1 spray into both nostrils daily.   Yes [provider]  gabapentin (NEURONTIN) 600 MG tablet Take 0.5 tablets (300 mg total) by mouth 3 (three) times daily. Patient taking differently: Take 600 mg by  mouth 3 (three) times daily. 01/30/23  Yes Narda Bonds, MD  Ipratropium-Albuterol (COMBIVENT) 20-100 MCG/ACT AERS respimat Inhale 1 puff into the lungs 2 (two) times daily.   Yes [provider]  levothyroxine (SYNTHROID, LEVOTHROID) 75 MCG tablet Take 75 mcg by mouth daily before breakfast. 02/05/16  Yes [provider]  lidocaine (HM LIDOCAINE PATCH) 4 % Place 1 patch onto the skin daily.   Yes [provider]  memantine (NAMENDA) 10 MG tablet Take 10 mg by mouth 2 (two) times daily.   Yes [provider]  Multiple Vitamin (MULTIVITAMIN WITH MINERALS) TABS tablet Take 1 tablet by mouth daily with breakfast.   Yes [provider]  oxyCODONE (ROXICODONE) 15 MG immediate release tablet Take 1 tablet (15 mg total) by mouth daily as needed (Breakthrough pain). Patient taking differently: Take 15 mg by mouth daily as needed (Breakthrough pain between 0000-0400). 01/30/23  Yes Narda Bonds, MD  Oxycodone HCl 10 MG TABS Take 1 tablet (10 mg total) by mouth 4 (four) times daily as needed (Severe pain). Patient taking differently: Take 10 mg by mouth 4 (four) times daily. 01/30/23  Yes Narda Bonds, MD  pantoprazole (PROTONIX) 40 MG tablet Take 1 tablet (40 mg total) by mouth 2 (two) times daily. 11/12/22 11/12/23 Yes Kathlen Mody, MD  phenylephrine-shark liver oil-mineral oil-petrolatum (PREPARATION H) 0.25-14-74.9 % rectal ointment Place 1 Application rectally every 12 (twelve) hours.   Yes [provider]  PSYLLIUM HUSK PO Take 0.4 g by mouth in the morning and at bedtime.   Yes [provider]  senna-docusate (SENOKOT-S) 8.6-50 MG tablet Take 1 tablet by mouth 2 (two) times daily as needed for moderate constipation. Patient taking differently: Take 1 tablet by mouth daily. 11/06/22  Yes Almon Hercules, MD  sodium chloride (OCEAN) 0.65 % SOLN nasal spray Place 1 spray into both nostrils in the morning, at noon, and at bedtime.   Yes  [provider]  ticagrelor (BRILINTA) 90 MG TABS tablet Take 1 tablet (90 mg total) by mouth 2 (two) times daily. 11/06/22  Yes Almon Hercules, MD  traZODone (DESYREL) 50 MG tablet Take 50 mg by mouth at bedtime. 05/05/23  Yes [provider]  vitamin C (ASCORBIC ACID) 500 MG tablet Take 500 mg by mouth daily.   Yes [provider]  ferrous gluconate (FERGON) 324 MG tablet Take 324 mg by mouth daily with breakfast. Patient not taking: Reported on 06/13/2023    [provider]  polyethylene glycol (MIRALAX / GLYCOLAX) 17 g packet Take 17 g by mouth 2 (two) times daily as needed for mild constipation. Patient not taking: Reported on 06/13/2023 11/06/22   Almon Hercules, MD  pramipexole (MIRAPEX) 0.25 MG tablet Take 1 tablet (0.25 mg total) by mouth 2 (two) times daily. Patient taking differently: Take 0.25 mg by mouth 3 (three) times daily. 01/30/23   Narda Bonds, MD  tiZANidine (ZANAFLEX) 4 MG tablet Take 4 mg by mouth every 6 (six) hours as needed for muscle spasms. Patient not taking: Reported on 06/13/2023    [provider]    Scheduled Meds:  sodium chloride   Intravenous Once   sodium chloride  Intravenous Once   atorvastatin  80 mg Oral Daily   fluticasone furoate-vilanterol  1 puff Inhalation Daily   insulin aspart  0-9 Units Subcutaneous Q4H   ipratropium  0.5 mg Nebulization QID   levothyroxine  75 mcg Oral QAC breakfast   midodrine  5 mg Oral TID WC   pantoprazole (PROTONIX) IV  40 mg Intravenous Q12H   pramipexole  0.25 mg Oral BID   Continuous Infusions:  azithromycin     norepinephrine (LEVOPHED) Adult infusion Stopped (06/13/23 0419)   PRN Meds:.docusate sodium, levalbuterol, morphine injection, polyethylene glycol  Allergies as of 06/12/2023 - Review Complete 06/12/2023  Allergen Reaction Noted   Penicillins Anaphylaxis, Swelling, and Other (See Comments) 05/14/2016   Penicillin g Hives 11/03/2022    History reviewed.  No pertinent family history.  Social History   Socioeconomic History   Marital status: Single    Spouse name: Not on file   Number of children: Not on file   Years of education: Not on file   Highest education level: Not on file  Occupational History   Not on file  Tobacco Use   Smoking status: Every Day    Current packs/day: 1.50    Types: Cigarettes   Smokeless tobacco: Never  Vaping Use   Vaping status: Never Used  Substance and Sexual Activity   Alcohol use: No   Drug use: No   Sexual activity: Not on file  Other Topics Concern   Not on file  Social History Narrative   Not on file   Social Drivers of Health   Financial Resource Strain: Not on file  Food Insecurity: No Food Insecurity (01/28/2023)   Hunger Vital Sign    Worried About Running Out of Food in the Last Year: Never true    Ran Out of Food in the Last Year: Never true  Transportation Needs: No Transportation Needs (01/28/2023)   PRAPARE - Administrator, Civil Service (Medical): No    Lack of Transportation (Non-Medical): No  Physical Activity: Not on file  Stress: Not on file  Social Connections: Not on file  Intimate Partner Violence: Not At Risk (01/28/2023)   Humiliation, Afraid, Rape, and Kick questionnaire    Fear of Current or Ex-Partner: No    Emotionally Abused: No    Physically Abused: No    Sexually Abused: No    Review of Systems: All negative except as stated above in HPI.  Physical Exam: Vital signs: Vitals:   06/13/23 1100 06/13/23 1120  BP: 107/84 108/88  Pulse: (!) 115 (!) 118  Resp: (!) 21 (!) 24  Temp:  98.2 F (36.8 C)  SpO2: 98% 100%     General:   chronically ill-appearing, lethargic, elderly, well-nourished, no acute distress Head: normocephalic, atraumatic Eyes: anicteric sclera ENT: oropharynx clear, dried blood on right ear Neck: supple, nontender Lungs:  Clear throughout to auscultation.   No wheezes, crackles, or rhonchi. No acute distress. Heart:   Regular rate and rhythm; no murmurs, clicks, rubs,  or gallops. Abdomen: soft, nontender, nondistended, +BS  Rectal:  Large partially reducible rectal prolapse with brown stool noted; tender prolapsed erythematous tissue Ext: no edema  GI:  Lab Results: Recent Labs    06/12/23 2333 06/12/23 2353 06/13/23 0555  WBC 19.8*  --  18.1*  HGB 6.3* 7.8* 7.2*  HCT 22.3* 23.0* 23.2*  PLT 507*  --  358   BMET Recent Labs    06/12/23 2333 06/12/23 2353 06/13/23 0555  NA 134* 134* 135  K 3.9 3.8 4.4  CL 93*  --  96*  CO2 26  --  27  GLUCOSE 274*  --  121*  BUN 18  --  18  CREATININE 0.92  --  0.84  CALCIUM 8.8*  --  9.8   LFT Recent Labs    06/12/23 2333  PROT 7.0  ALBUMIN 3.3*  AST 29  ALT 21  ALKPHOS 72  BILITOT 0.4   PT/INR Recent Labs    06/12/23 2333  LABPROT 13.0  INR 1.0     Studies/Results: CT Angio Chest Pulmonary Embolism (PE) W or WO Contrast Result Date: 06/13/2023 CLINICAL DATA:  Pulmonary embolism (PE) suspected, high prob Respiratory distress. EXAM: CT ANGIOGRAPHY CHEST WITH CONTRAST TECHNIQUE: Multidetector CT imaging of the chest was performed using the standard protocol during bolus administration of intravenous contrast. Multiplanar CT image reconstructions and MIPs were obtained to evaluate the vascular anatomy. RADIATION DOSE REDUCTION: This exam was performed according to the departmental dose-optimization program which includes automated exposure control, adjustment of the mA and/or kV according to patient size and/or use of iterative reconstruction technique. CONTRAST:  50mL OMNIPAQUE IOHEXOL 350 MG/ML SOLN COMPARISON:  Radiograph yesterday.  Chest CT 10/13/2021 FINDINGS: Cardiovascular: Heart is normal in size. There are no filling defects within the pulmonary arteries to suggest pulmonary embolus. Advanced calcified noncalcified atheromatous plaque throughout the thoracic aorta. This includes irregular plaque throughout the descending aorta. No  aneurysm or acute aortic findings. 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 There are coronary artery calcifications. No pericardial effusion. Mediastinum/Nodes: Enlarged 14 mm right hilar lymph node no enlarged mediastinal nodes. No thyroid nodule. Small hiatal hernia. Lungs/Pleura: Moderate emphysema. Moderate bronchial thickening. Areas of interstitial and septal thickening primarily involving the lung bases and anterior lungs. Minimal consolidation dependently in the lower lobes. 3 mm right upper lobe nodule series 6, image 53, unchanged. Perifissural 7 mm right lower lobe nodule series 6, image 95, unchanged. One of the right lower lobe nodules on prior has resolved, 1 of the right lower lobe nodules on prior is obscured on the current exam. There are in new ill-defined nodular opacities in the left upper lobe series 6, image 32 measuring 9 mm medially and 6 mm laterally. Ill-defined left upper lobe opacity series 6, image 51, new. Punctate benign granuloma in the left upper lobe. No significant pleural effusion. Upper Abdomen: No acute upper abdominal findings. Musculoskeletal: Mild scoliosis and thoracic spondylosis. There are no acute or suspicious osseous abnormalities. Review of the MIP images confirms the above findings. IMPRESSION: 1. No pulmonary embolus. 2. Areas of interstitial and septal thickening primarily involving the lung bases and anterior lungs, suspicious for pulmonary edema. Atypical infection could have a similar appearance. 3. Minimal consolidation/atelectasis dependently in the lower lobes. 4. New ill-defined nodular opacities in the left upper lobe measuring up to 9 mm, likely infectious or inflammatory. Recommend CT follow-up in 3 months. 5. Many of the right lung pulmonary nodules on prior exam are unchanged. At least 1 of the previous right lung nodules has resolved. No specific follow-up of these nodules is needed. 6. Enlarged right hilar lymph node, likely  reactive. 7. Moderate emphysema. Aortic Atherosclerosis (ICD10-I70.0) and Emphysema (ICD10-J43.9). Electronically Signed   By: Narda Rutherford M.D.   On: 06/13/2023 02:45   DG Chest Port 1 View Result Date: 06/12/2023 CLINICAL DATA:  Short of breath EXAM: PORTABLE CHEST 1 VIEW COMPARISON:  10/13/2021 FINDINGS: Single frontal view  of the chest demonstrates a stable cardiac silhouette. Diffuse interstitial opacities are seen throughout the lungs, with patchy bibasilar ground-glass airspace disease. No effusion or pneumothorax. No acute bony abnormalities. IMPRESSION: 1. Diffuse interstitial prominence with patchy bibasilar ground-glass airspace disease, which may reflect pulmonary edema or atypical infection such as viral pneumonitis. Electronically Signed   By: Sharlet Salina M.D.   On: 06/12/2023 23:55    Impression/Plan: Chronic rectal prolapse with acute blood loss (Hgb 6.3) admitted for acute on chronic respiratory failure. Await CT abd/pelvis to look for bowel ischemia. Lactic acid pending. Rectal prolapse that is visible does not look ischemic. I do not think a flexible sigmoidoscopy is needed unless concern for severe ischemia on CT and then will also need surgical evaluation. [facility] will f/u.    LOS: 0 days   Shirley Friar  06/13/2023, 11:46 AM  Questions please call [phone]

## 2023-06-14 ENCOUNTER — Inpatient Hospital Stay (HOSPITAL_COMMUNITY): Payer: 59

## 2023-06-14 DIAGNOSIS — I214 Non-ST elevation (NSTEMI) myocardial infarction: Secondary | ICD-10-CM | POA: Diagnosis not present

## 2023-06-14 LAB — BPAM RBC
Blood Product Expiration Date: 202501032359
Blood Product Expiration Date: 202501102359
ISSUE DATE / TIME: 202412290238
ISSUE DATE / TIME: 202412291408
Unit Type and Rh: 9500
Unit Type and Rh: 9500

## 2023-06-14 LAB — CBC
HCT: 26.7 % — ABNORMAL LOW (ref 36.0–46.0)
Hemoglobin: 8.7 g/dL — ABNORMAL LOW (ref 12.0–15.0)
MCH: 27.4 pg (ref 26.0–34.0)
MCHC: 32.6 g/dL (ref 30.0–36.0)
MCV: 84 fL (ref 80.0–100.0)
Platelets: 397 10*3/uL (ref 150–400)
RBC: 3.18 MIL/uL — ABNORMAL LOW (ref 3.87–5.11)
RDW: 16.3 % — ABNORMAL HIGH (ref 11.5–15.5)
WBC: 17.9 10*3/uL — ABNORMAL HIGH (ref 4.0–10.5)
nRBC: 0.7 % — ABNORMAL HIGH (ref 0.0–0.2)

## 2023-06-14 LAB — RESPIRATORY PANEL BY PCR

## 2023-06-14 LAB — TYPE AND SCREEN
ABO/RH(D): O NEG
Antibody Screen: NEGATIVE
Unit division: 0
Unit division: 0

## 2023-06-14 LAB — GLUCOSE, CAPILLARY
Glucose-Capillary: 104 mg/dL — ABNORMAL HIGH (ref 70–99)
Glucose-Capillary: 105 mg/dL — ABNORMAL HIGH (ref 70–99)
Glucose-Capillary: 108 mg/dL — ABNORMAL HIGH (ref 70–99)
Glucose-Capillary: 112 mg/dL — ABNORMAL HIGH (ref 70–99)
Glucose-Capillary: 114 mg/dL — ABNORMAL HIGH (ref 70–99)
Glucose-Capillary: 86 mg/dL (ref 70–99)

## 2023-06-14 LAB — ECHOCARDIOGRAM COMPLETE
Area-P 1/2: 7.99 cm2
Calc EF: 30.9 %
Height: 61.25 in
MV VTI: 3.54 cm2
S' Lateral: 2.7 cm
Single Plane A2C EF: 25.6 %
Single Plane A4C EF: 39.5 %
Weight: 2328.06 [oz_av]

## 2023-06-14 LAB — BASIC METABOLIC PANEL
Anion gap: 12 (ref 5–15)
BUN: 17 mg/dL (ref 8–23)
CO2: 29 mmol/L (ref 22–32)
Calcium: 9.4 mg/dL (ref 8.9–10.3)
Chloride: 94 mmol/L — ABNORMAL LOW (ref 98–111)
Creatinine, Ser: 0.83 mg/dL (ref 0.44–1.00)
GFR, Estimated: >60 mL/min (ref >60)
Glucose, Bld: 101 mg/dL — ABNORMAL HIGH (ref 70–99)
Potassium: 3.8 mmol/L (ref 3.5–5.1)
Sodium: 135 mmol/L (ref 135–145)

## 2023-06-14 LAB — HEMOGLOBIN A1C
Hgb A1c MFr Bld: 5.9 % — ABNORMAL HIGH (ref 4.8–5.6)
Mean Plasma Glucose: 123 mg/dL

## 2023-06-14 LAB — MRSA NEXT GEN BY PCR, NASAL: MRSA by PCR Next Gen: NOT DETECTED

## 2023-06-14 LAB — LACTIC ACID, PLASMA: Lactic Acid, Venous: 1.1 mmol/L (ref 0.5–1.9)

## 2023-06-14 MED ORDER — MORPHINE SULFATE (PF) 2 MG/ML IV SOLN: 1.0000 mg | INTRAVENOUS | Status: DC | PRN

## 2023-06-14 MED ORDER — SUCRALFATE 1 GM/10ML PO SUSP
1.0000 g | Freq: Three times a day (TID) | ORAL | Status: DC
  Administered 2023-06-14 – 2023-06-17 (×10): 1 g via ORAL
  Filled 2023-06-14 (×11): qty 10

## 2023-06-14 MED ORDER — MORPHINE SULFATE (PF) 2 MG/ML IV SOLN
1.0000 mg | INTRAVENOUS | Status: DC | PRN
  Administered 2023-06-14 – 2023-06-17 (×17): 1 mg via INTRAVENOUS
  Filled 2023-06-14 (×17): qty 1

## 2023-06-14 MED ORDER — MEMANTINE HCL 10 MG PO TABS
10.0000 mg | ORAL_TABLET | Freq: Two times a day (BID) | ORAL | Status: DC
  Administered 2023-06-14 – 2023-06-17 (×7): 10 mg via ORAL
  Filled 2023-06-14 (×7): qty 1

## 2023-06-14 MED ORDER — PERFLUTREN LIPID MICROSPHERE
1.0000 mL | INTRAVENOUS | Status: AC | PRN
  Administered 2023-06-14: 2 mL via INTRAVENOUS

## 2023-06-14 MED ORDER — POLYETHYLENE GLYCOL 3350 17 G PO PACK
17.0000 g | PACK | Freq: Every day | ORAL | Status: DC
  Administered 2023-06-14 – 2023-06-15 (×2): 17 g via ORAL
  Filled 2023-06-14 (×5): qty 1

## 2023-06-14 MED ORDER — IPRATROPIUM BROMIDE 0.02 % IN SOLN
0.5000 mg | Freq: Two times a day (BID) | RESPIRATORY_TRACT | Status: DC
  Administered 2023-06-14 – 2023-06-17 (×5): 0.5 mg via RESPIRATORY_TRACT
  Filled 2023-06-14 (×6): qty 2.5

## 2023-06-14 MED ORDER — POTASSIUM CHLORIDE CRYS ER 20 MEQ PO TBCR
20.0000 meq | EXTENDED_RELEASE_TABLET | Freq: Once | ORAL | Status: AC
  Administered 2023-06-14: 20 meq via ORAL
  Filled 2023-06-14: qty 1

## 2023-06-14 MED ORDER — DULOXETINE HCL 60 MG PO CPEP
60.0000 mg | ORAL_CAPSULE | Freq: Every day | ORAL | Status: DC
  Administered 2023-06-14 – 2023-06-16 (×3): 60 mg via ORAL
  Filled 2023-06-14 (×3): qty 1

## 2023-06-14 MED ORDER — OXYCODONE HCL 5 MG PO TABS
5.0000 mg | ORAL_TABLET | ORAL | Status: DC | PRN
  Administered 2023-06-14: 5 mg via ORAL
  Filled 2023-06-14: qty 1

## 2023-06-14 MED ORDER — OXYCODONE HCL 5 MG PO TABS
5.0000 mg | ORAL_TABLET | ORAL | Status: DC | PRN
  Administered 2023-06-17: 10 mg via ORAL
  Filled 2023-06-14: qty 2

## 2023-06-14 MED ORDER — FUROSEMIDE 10 MG/ML IJ SOLN
40.0000 mg | Freq: Once | INTRAMUSCULAR | Status: AC
Start: 2023-06-14 — End: 2023-06-14
  Administered 2023-06-14: 40 mg via INTRAVENOUS
  Filled 2023-06-14: qty 4

## 2023-06-14 MED ORDER — ONDANSETRON HCL 4 MG/2ML IJ SOLN
4.0000 mg | Freq: Four times a day (QID) | INTRAMUSCULAR | Status: DC | PRN
  Administered 2023-06-14 – 2023-06-17 (×2): 4 mg via INTRAVENOUS
  Filled 2023-06-14 (×2): qty 2

## 2023-06-14 NOTE — Progress Notes (Signed)
[facility] Progress Note  SUBJECTIVE:   Interval history: Shiquita Bereniz Tocci was seen and evaluated today at bedside. Grand-daughter, power of attorney, Aundra Millet, present at bedside. History difficult to obtain from patient, grand-daughter at bedside noted a history of dementia. Patient noted that she has been experiencing epigastric abdominal pain over the past week. She has been having regular bowel movements during this time. She has some nausea, no vomiting. She noted having acid reflux as well. She noted having some blood per rectum with wiping. No emesis. Eating can sometimes make her epigastric discomfort worse. Taking a deep breath is associated with her abdominal pain.   Past Medical History:  Diagnosis Date   Acid reflux    COPD (chronic obstructive pulmonary disease) (HCC)    Hypertension    Past Surgical History:  Procedure Laterality Date   BIOPSY  10/17/2021   Procedure: BIOPSY;  Surgeon: Charlott Rakes, MD;  Location: [facility];  Service: Gastroenterology;;   BIOPSY  01/29/2023   Procedure: BIOPSY;  Surgeon: Charlott Rakes, MD;  Location: [facility];  Service: Gastroenterology;;   COLONOSCOPY N/A 10/17/2021   Procedure: COLONOSCOPY;  Surgeon: Charlott Rakes, MD;  Location: [facility];  Service: Gastroenterology;  Laterality: N/A;   COLONOSCOPY WITH PROPOFOL N/A 10/18/2021   Procedure: COLONOSCOPY WITH PROPOFOL;  Surgeon: Kathi Der, MD;  Location: [facility];  Service: Gastroenterology;  Laterality: N/A;   ESOPHAGOGASTRODUODENOSCOPY N/A 10/17/2021   Procedure: ESOPHAGOGASTRODUODENOSCOPY (EGD);  Surgeon: Charlott Rakes, MD;  Location: [facility];  Service: Gastroenterology;  Laterality: N/A;   FLEXIBLE SIGMOIDOSCOPY N/A 01/29/2023   Procedure: FLEXIBLE SIGMOIDOSCOPY;  Surgeon: Charlott Rakes, MD;  Location: [facility];  Service: Gastroenterology;  Laterality: N/A;   POLYPECTOMY  10/18/2021   Procedure: POLYPECTOMY;  Surgeon: Kathi Der,  MD;  Location: [facility];  Service: Gastroenterology;;   Current Facility-Administered Medications  Medication Dose Route Frequency Provider Last Rate Last Admin   0.9 %  sodium chloride infusion (Manually program via Guardrails IV Fluids)   Intravenous Once Simonne Martinet, NP       atorvastatin (LIPITOR) tablet 80 mg  80 mg Oral Daily Simonne Martinet, NP   80 mg at 06/14/23 0907   azithromycin (ZITHROMAX) 500 mg in sodium chloride 0.9 % 250 mL IVPB  500 mg Intravenous QHS Simonne Martinet, NP   Stopped at 06/13/23 2314   docusate sodium (COLACE) capsule 100 mg  100 mg Oral BID PRN Simonne Martinet, NP       fluticasone furoate-vilanterol (BREO ELLIPTA) 200-25 MCG/ACT 1 puff  1 puff Inhalation Daily Simonne Martinet, NP       HYDROmorphone (DILAUDID) injection 0.5 mg  0.5 mg Intravenous Q3H PRN Massie Maroon, MD   0.5 mg at 06/14/23 0908   insulin aspart (novoLOG) injection 0-9 Units  0-9 Units Subcutaneous Q4H Simonne Martinet, NP   1 Units at 06/13/23 0521   ipratropium (ATROVENT) nebulizer solution 0.5 mg  0.5 mg Nebulization QID Simonne Martinet, NP   0.5 mg at 06/13/23 1942   levalbuterol (XOPENEX) nebulizer solution 0.63 mg  0.63 mg Nebulization Q6H PRN Simonne Martinet, NP   0.63 mg at 06/13/23 2154   levothyroxine (SYNTHROID) tablet 75 mcg  75 mcg Oral QAC breakfast Simonne Martinet, NP   75 mcg at 06/14/23 0908   morphine (PF) 2 MG/ML injection 2 mg  2 mg Intravenous Q2H PRN Simonne Martinet, NP   2 mg at 06/13/23 2312   pantoprazole (PROTONIX) injection 40  mg  40 mg Intravenous Q12H Simonne Martinet, NP   40 mg at 06/14/23 0908   polyethylene glycol (MIRALAX / GLYCOLAX) packet 17 g  17 g Oral Daily PRN Simonne Martinet, NP       pramipexole (MIRAPEX) tablet 0.25 mg  0.25 mg Oral BID Simonne Martinet, NP   0.25 mg at 06/14/23 9629   Allergies as of 06/12/2023 - Review Complete 06/12/2023  Allergen Reaction Noted   Penicillins Anaphylaxis, Swelling, and Other (See Comments)  05/14/2016   Penicillin g Hives 11/03/2022   Review of Systems:  Review of Systems  Respiratory:  Positive for shortness of breath.   Cardiovascular:  Negative for chest pain.  Gastrointestinal:  Positive for abdominal pain, heartburn and nausea. Negative for constipation, diarrhea and vomiting.       Blood per rectum    OBJECTIVE:   Temp:  [97.9 F (36.6 C)-98.8 F (37.1 C)] 97.9 F (36.6 C) (12/30 0845) Pulse Rate:  [56-124] 56 (12/30 0845) Resp:  [11-24] 17 (12/30 0845) BP: (107-149)/(71-106) 141/96 (12/30 0845) SpO2:  [97 %-100 %] 98 % (12/30 0845) Weight:  [66 kg] 66 kg (12/30 0441) Last BM Date : 06/13/23 Physical Exam Constitutional:      General: She is not in acute distress.    Appearance: She is not ill-appearing, toxic-appearing or diaphoretic.  Cardiovascular:     Rate and Rhythm: Tachycardia present.  Pulmonary:     Effort: No respiratory distress.     Breath sounds: Normal breath sounds.     Comments: Supplemental oxygen 3 L nasal cannula in place Abdominal:     General: Bowel sounds are normal. There is no distension.     Palpations: Abdomen is soft.     Tenderness: There is no guarding.  Skin:    General: Skin is warm and dry.  Neurological:     Mental Status: She is alert.     Labs: Recent Labs    06/12/23 2333 06/12/23 2353 06/13/23 0555 06/13/23 1143 06/13/23 2035 06/14/23 0753  WBC 19.8*  --  18.1*  --   --  17.9*  HGB 6.3*   < > 7.2* 7.5* 8.7* 8.7*  HCT 22.3*   < > 23.2* 24.0* 26.8* 26.7*  PLT 507*  --  358  --   --  397   < > = values in this interval not displayed.   BMET Recent Labs    06/12/23 2333 06/12/23 2353 06/13/23 0555 06/14/23 0753  NA 134* 134* 135 135  K 3.9 3.8 4.4 3.8  CL 93*  --  96* 94*  CO2 26  --  27 29  GLUCOSE 274*  --  121* 101*  BUN 18  --  18 17  CREATININE 0.92  --  0.84 0.83  CALCIUM 8.8*  --  9.8 9.4   LFT Recent Labs    06/12/23 2333  PROT 7.0  ALBUMIN 3.3*  AST 29  ALT 21  ALKPHOS 72   BILITOT 0.4   PT/INR Recent Labs    06/12/23 2333  LABPROT 13.0  INR 1.0   Diagnostic imaging: ECHOCARDIOGRAM COMPLETE Result Date: 06/14/2023    ECHOCARDIOGRAM REPORT   Patient Name:   ASTACIA RUSCHE Date of Exam: 06/14/2023 Medical Rec #:  [id number]          Height:       61.3 in Accession #:    [id number]         Weight:  145.5 lb Date of Birth:  [birth year]          BSA:          1.655 m Patient Age:    88 years           BP:           122/87 mmHg Patient Gender: F                  HR:           116 bpm. Exam Location:  Inpatient Procedure: 2D Echo, 3D Echo, Cardiac Doppler, Color Doppler and Intracardiac            Opacification Agent Indications:     121-121.4 ST elevation (STEMI) and non-ST elevation (NSTEMI)                  nyocardial infarction  History:         Patient has prior history of Echocardiogram examinations, most                  recent 11/04/2022. Previous Myocardial Infarction, Abnormal ECG,                  COPD and Stroke, Signs/Symptoms:Shortness of Breath, Dyspnea,                  Chest Pain and Altered Mental Status; Risk Factors:Current                  Smoker.  Sonographer:     Sheralyn Boatman RDCS Referring Phys:  [id number] PETER E BABCOCK Diagnosing Phys: Dorthula Nettles IMPRESSIONS  1. Left ventricular ejection fraction, by estimation, is 40%-45%. The left ventricle has moderately decreased function. The left ventricle demonstrates regional wall motion abnormalities (see scoring diagram/findings for description). The left ventricular internal cavity size was moderately dilated. Left ventricular diastolic parameters are consistent with Grade III diastolic dysfunction (restrictive).  2. Right ventricular systolic function is normal. The right ventricular size is normal.  3. Left atrial size was mild to moderately dilated.  4. The mitral valve is grossly normal. Trivial mitral valve regurgitation. No evidence of mitral stenosis. Moderate mitral annular calcification.  5.  Tricuspid valve regurgitation is moderate.  6. The aortic valve is tricuspid. Aortic valve regurgitation is not visualized. No aortic stenosis is present.  7. The inferior vena cava is dilated in size with >50% respiratory variability, suggesting right atrial pressure of 8 mmHg. FINDINGS  Left Ventricle: Left ventricular ejection fraction, by estimation, is 40 to 45%. The left ventricle has mildly decreased function. The left ventricle demonstrates regional wall motion abnormalities. The left ventricular internal cavity size was moderately dilated. There is no left ventricular hypertrophy. Left ventricular diastolic parameters are consistent with Grade III diastolic dysfunction (restrictive). Right Ventricle: The right ventricular size is normal. No increase in right ventricular wall thickness. Right ventricular systolic function is normal. Left Atrium: Left atrial size was mild to moderately dilated. Right Atrium: Right atrial size was normal in size. Pericardium: There is no evidence of pericardial effusion. Mitral Valve: The mitral valve is grossly normal. Moderate mitral annular calcification. Trivial mitral valve regurgitation. No evidence of mitral valve stenosis. MV peak gradient, 15.8 mmHg. The mean mitral valve gradient is 6.5 mmHg. Tricuspid Valve: The tricuspid valve is normal in structure. Tricuspid valve regurgitation is moderate . No evidence of tricuspid stenosis. Aortic Valve: The aortic valve is tricuspid. Aortic valve regurgitation is not visualized. No aortic stenosis is  present. Pulmonic Valve: The pulmonic valve was normal in structure. Pulmonic valve regurgitation is not visualized. No evidence of pulmonic stenosis. Aorta: The aortic root and ascending aorta are structurally normal, with no evidence of dilitation. Venous: The inferior vena cava is dilated in size with greater than 50% respiratory variability, suggesting right atrial pressure of 8 mmHg. IAS/Shunts: No atrial level shunt detected  by color flow Doppler.  LEFT VENTRICLE PLAX 2D LVIDd:         3.65 cm LVIDs:         2.70 cm LV PW:         1.83 cm LV IVS:        0.90 cm LVOT diam:     2.30 cm     3D Volume EF: LV SV:         81          3D EF:        40 % LV SV Index:   49          LV EDV:       118 ml LVOT Area:     4.15 cm    LV ESV:       71 ml                            LV SV:        47 ml  LV Volumes (MOD) LV vol d, MOD A2C: 75.1 ml LV vol d, MOD A4C: 76.0 ml LV vol s, MOD A2C: 55.9 ml LV vol s, MOD A4C: 46.0 ml LV SV MOD A2C:     19.2 ml LV SV MOD A4C:     76.0 ml LV SV MOD BP:      24.3 ml RIGHT VENTRICLE             IVC RV S prime:     10.30 cm/s  IVC diam: 2.10 cm TAPSE (M-mode): 2.0 cm LEFT ATRIUM             Index        RIGHT ATRIUM          Index LA diam:        2.10 cm 1.27 cm/m   RA Area:     9.66 cm LA Vol (A2C):   46.5 ml 28.09 ml/m  RA Volume:   18.40 ml 11.12 ml/m LA Vol (A4C):   36.4 ml 21.99 ml/m LA Biplane Vol: 43.7 ml 26.40 ml/m  AORTIC VALVE LVOT Vmax:   112.00 cm/s LVOT Vmean:  75.733 cm/s LVOT VTI:    0.196 m  AORTA Ao Root diam: 3.00 cm Ao Asc diam:  3.20 cm MITRAL VALVE                TRICUSPID VALVE MV Area (PHT): 7.99 cm     TR Peak grad:   43.0 mmHg MV Area VTI:   3.54 cm     TR Vmax:        328.00 cm/s MV Peak grad:  15.8 mmHg MV Mean grad:  6.5 mmHg     SHUNTS MV Vmax:       1.99 m/s     Systemic VTI:  0.20 m MV Vmean:      114.5 cm/s   Systemic Diam: 2.30 cm MV Decel Time: 95 msec MV E velocity: 192.00 cm/s Aditya Sabharwal Electronically signed by Dorthula Nettles Signature Date/Time: 06/14/2023/9:37:53 AM  Final (Updated)    CT ABDOMEN PELVIS W CONTRAST Result Date: 06/13/2023 CLINICAL DATA:  Acute onset shortness of breath and rectal pain and blood red blood per rectum EXAM: CT ABDOMEN AND PELVIS WITH CONTRAST TECHNIQUE: Multidetector CT imaging of the abdomen and pelvis was performed using the standard protocol following bolus administration of intravenous contrast. RADIATION DOSE REDUCTION:  This exam was performed according to the departmental dose-optimization program which includes automated exposure control, adjustment of the mA and/or kV according to patient size and/or use of iterative reconstruction technique. CONTRAST:  75mL OMNIPAQUE IOHEXOL 350 MG/ML SOLN COMPARISON:  CT abdomen and pelvis dated 01/28/2023, earlier same day CTA chest FINDINGS: Lower chest: Diffuse interlobular septal thickening. Scattered subpleural ground-glass densities. Increased basilar right lower lobe consolidation. Similar minimal left basilar atelectasis/consolidation. Trace left pleural effusion. Partially imaged heart size is normal. Coronary calcifications. Hepatobiliary: No focal hepatic lesions. No intra or extrahepatic biliary ductal dilation. Normal gallbladder. Pancreas: Unchanged hypodensity in the pancreatic uncinate process measuring up to 1.1 cm (3:36) when measured similarly. No specific follow-up imaging recommended. Spleen: Normal in size without focal abnormality. Adrenals/Urinary Tract: No adrenal nodules. No suspicious renal mass, calculi or hydronephrosis. Excreted contrast material within the urinary bladder. Stomach/Bowel: Small hiatal hernia. Normal appearance of the stomach. Increased mural thickening and mucosal hyperenhancement of the prolapsed rectum. No abnormal bowel dilation. Colonic diverticulosis without acute diverticulitis. Appendix is not discretely seen. Vascular/Lymphatic: Aortic atherosclerosis. Celiac artery stent is patent. 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 The narrow caliber of the stent precludes evaluation of the SMA origin. No enlarged abdominal or pelvic lymph nodes. Reproductive: No adnexal masses. Other: No free fluid, fluid collection, or free air. Musculoskeletal: No acute or abnormal lytic or blastic osseous lesions. Multilevel degenerative changes of the partially imaged thoracic and lumbar spine. Increased perianal  soft tissue stranding. Small fat-containing paraumbilical hernia. IMPRESSION: 1. Increased mural thickening and mucosal hyperenhancement of the prolapsed rectum with increased perianal soft tissue stranding, consistent with proctitis. 2. Increased basilar right lower lobe consolidation, suspicious for atelectasis. 3. Diffuse interlobular septal thickening and scattered subpleural ground-glass densities, which may represent pulmonary edema. 4. Trace left pleural effusion. 5. Celiac artery stent is patent. 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 6. Aortic Atherosclerosis (ICD10-I70.0). Coronary artery calcifications. Assessment for potential risk factor modification, dietary therapy or pharmacologic therapy may be warranted, if clinically indicated. Electronically Signed   By: Agustin Cree M.D.   On: 06/13/2023 13:36   CT Angio Chest Pulmonary Embolism (PE) W or WO Contrast Result Date: 06/13/2023 CLINICAL DATA:  Pulmonary embolism (PE) suspected, high prob Respiratory distress. EXAM: CT ANGIOGRAPHY CHEST WITH CONTRAST TECHNIQUE: Multidetector CT imaging of the chest was performed using the standard protocol during bolus administration of intravenous contrast. Multiplanar CT image reconstructions and MIPs were obtained to evaluate the vascular anatomy. RADIATION DOSE REDUCTION: This exam was performed according to the departmental dose-optimization program which includes automated exposure control, adjustment of the mA and/or kV according to patient size and/or use of iterative reconstruction technique. CONTRAST:  50mL OMNIPAQUE IOHEXOL 350 MG/ML SOLN COMPARISON:  Radiograph yesterday.  Chest CT 10/13/2021 FINDINGS: Cardiovascular: Heart is normal in size. There are no filling defects within the pulmonary arteries to suggest pulmonary embolus. Advanced calcified noncalcified atheromatous plaque throughout the thoracic aorta. This includes irregular plaque throughout the descending aorta. No aneurysm or acute  aortic findings. 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 There are  coronary artery calcifications. No pericardial effusion. Mediastinum/Nodes: Enlarged 14 mm right hilar lymph node no enlarged mediastinal nodes. No thyroid nodule. Small hiatal hernia. Lungs/Pleura: Moderate emphysema. Moderate bronchial thickening. Areas of interstitial and septal thickening primarily involving the lung bases and anterior lungs. Minimal consolidation dependently in the lower lobes. 3 mm right upper lobe nodule series 6, image 53, unchanged. Perifissural 7 mm right lower lobe nodule series 6, image 95, unchanged. One of the right lower lobe nodules on prior has resolved, 1 of the right lower lobe nodules on prior is obscured on the current exam. There are in new ill-defined nodular opacities in the left upper lobe series 6, image 32 measuring 9 mm medially and 6 mm laterally. Ill-defined left upper lobe opacity series 6, image 51, new. Punctate benign granuloma in the left upper lobe. No significant pleural effusion. Upper Abdomen: No acute upper abdominal findings. Musculoskeletal: Mild scoliosis and thoracic spondylosis. There are no acute or suspicious osseous abnormalities. Review of the MIP images confirms the above findings. IMPRESSION: 1. No pulmonary embolus. 2. Areas of interstitial and septal thickening primarily involving the lung bases and anterior lungs, suspicious for pulmonary edema. Atypical infection could have a similar appearance. 3. Minimal consolidation/atelectasis dependently in the lower lobes. 4. New ill-defined nodular opacities in the left upper lobe measuring up to 9 mm, likely infectious or inflammatory. Recommend CT follow-up in 3 months. 5. Many of the right lung pulmonary nodules on prior exam are unchanged. At least 1 of the previous right lung nodules has resolved. No specific follow-up of these nodules is needed. 6. Enlarged right hilar lymph node, likely reactive. 7.  Moderate emphysema. Aortic Atherosclerosis (ICD10-I70.0) and Emphysema (ICD10-J43.9). Electronically Signed   By: Narda Rutherford M.D.   On: 06/13/2023 02:45   DG Chest Port 1 View Result Date: 06/12/2023 CLINICAL DATA:  Short of breath EXAM: PORTABLE CHEST 1 VIEW COMPARISON:  10/13/2021 FINDINGS: Single frontal view of the chest demonstrates a stable cardiac silhouette. Diffuse interstitial opacities are seen throughout the lungs, with patchy bibasilar ground-glass airspace disease. No effusion or pneumothorax. No acute bony abnormalities. IMPRESSION: 1. Diffuse interstitial prominence with patchy bibasilar ground-glass airspace disease, which may reflect pulmonary edema or atypical infection such as viral pneumonitis. Electronically Signed   By: Sharlet Salina M.D.   On: 06/12/2023 23:55   IMPRESSION: Epigastric abdominal pain  -CTAP with no signs of ischemia  Rectal prolapse, chronic, stable in size  Lactic acidosis, resolved  COPD, on supplemental oxygen  Hypertension   PLAN: -Bowel regimen while on narcotic medication, change to Miralax daily, continue Colace -Continue IV PPI Q12Hr -Start sucralfate 1 gm PO QID -Ok for diet from GI perspective -If epigastric pain persists and cardiac evaluation is benign, can consider EGD for further evaluation -Supportive care for rectal prolapse -[facility] will follow   LOS: 1 day   Liliane Shi, [facility]

## 2023-06-14 NOTE — TOC Initial Note (Signed)
Transition of Care [facility]) - Inpatient Brief Assessment   Patient Details  Name: Amy Mckay MRN: [id number] Date of Birth: 1934/09/19  Transition of Care [facility]) CM/SW Contact:    Michaela Corner, LCSWA Phone Number: 06/14/2023, 10:31 AM   Clinical Narrative: CSW met pt and granddtr at bedside and explained self/role. CSW proceeded to ask pt if she needed any utility or housing assistance. Granddtr stated pt came from [facility]; pt states before going to [place] she lived with granddtr. Len Blalock states family will look into increasing home care when pt is able to return home.    Transition of Care Asessment: Insurance and Status: Insurance coverage has been reviewed Patient has primary care physician: Yes Home environment has been reviewed: [facility] Prior level of function:: Unable to assess Prior/Current Home Services: No current home services Social Drivers of Health Review: SDOH reviewed no interventions necessary Readmission risk has been reviewed: Yes Transition of care needs: no transition of care needs at this time

## 2023-06-14 NOTE — Progress Notes (Addendum)
Patient Name: Amy Mckay Date of Encounter: 06/14/2023 [facility] Cardiologist: None   Interval Summary  .    Patient lying in bed alert but uncomfortable. She reports her main concerns at this time to be her chronic back pain and abdominal pain which has been unchanged since admission. She denies any chest pain or palpitations.   Vital Signs .    Vitals:   06/13/23 2217 06/14/23 0045 06/14/23 0441 06/14/23 0845  BP: 118/80 124/88 122/87 (!) 141/96  Pulse: (!) 124 (!) 114 (!) 113 (!) 56  Resp: 18 12 17 17   Temp: 98.4 F (36.9 C) 98.2 F (36.8 C) 97.9 F (36.6 C) 97.9 F (36.6 C)  TempSrc: Oral Oral Oral Oral  SpO2: 100% 98% 97% 98%  Weight:   66 kg   Height:        Intake/Output Summary (Last 24 hours) at 06/14/2023 0936 Last data filed at 06/14/2023 0000 Gross per 24 hour  Intake 558.41 ml  Output --  Net 558.41 ml      06/14/2023    4:41 AM 06/13/2023   10:05 PM 06/12/2023   11:40 PM  Last 3 Weights  Weight (lbs) 145 lb 8.1 oz 145 lb 8.1 oz 132 lb 0.9 oz  Weight (kg) 66 kg 66 kg 59.9 kg      Telemetry/ECG    Sinus tachycardia HR 115 - Personally Reviewed  Physical Exam .   GEN: Patient laying in bed uncomfortable complaining of abdominal pain   Neck: No JVD Cardiac: regular rhythm, tachycardic, no murmurs.  Respiratory: Clear to auscultation bilaterally. GI: Soft, nontender, non-distended  MS: No edema Skin: warm and dry  Assessment & Plan .     Elevated troponins 130>166>242>229 Suspected to be secondary due to demand ischemia caused by acute blood loss  Denies any chest pain Patient is not candidate for cardiac catheterization  -- Echocardiogram done this AM, waiting results   Possible high output HF secondary to acute blood loss anemia  Awaiting echo results from this AM  Previous echo (10/2022) showed LVEF 60-65% with no regional wall abnormalities, LV diastolic parameters consistent with Grade I diastolic dysfunction,  normal RV function and size, IVC normal in size suggesting right atrial pressure of 3 mmHg -- Consider starting IV diuretics and titrating GDMT pending echo results   Acute blood loss anemia  History of mesenteric ischemia s/p SMA stent Abdominal pain Post 1 unit PRBC, hemoglobin up to 8.7 (12/30)  Seen by GI -- Pain management per primary team  Acute on chronic respiratory failure Hypotension, improved discontinued midodrine 12/30 History of CVA Ear trauma -- Treatment per primary team  For questions or updates, please contact [facility] Please consult [email or web address] for contact info under      Signed, Olena Leatherwood, PA-C   Patient seen and examined, note reviewed with the signed Advanced Practice Provider. I personally reviewed laboratory data, imaging studies and relevant notes. I independently examined the patient and formulated the important aspects of the plan. I have personally discussed the plan with the patient and/or family. Comments or changes to the note/plan are indicated below.  She is back to her baseline mental status.  Her granddaughter was in her room during my visit. She denies any anginal symptoms.  As noted previously suspect that her elevated troponin is likely in the setting of her anemia/type II demand ischemia.  Will continue medical therapy here with no use of anticoagulant or antiplatelet management.  No plans for ischemic evaluation in terms of heart catheterization at this time.  Will get echocardiogram to assess for any structural changes. I think she would benefit from at least 1 dose of Lasix IV today and then reassess the need for any further giving her echocardiogram.  She does have some bibasilar crackles here. Hemoglobin is improved but still low at 8.7.  She has been seen by GI.  GI is considering flexible sigmoidoscopy is needed unless concern for severe ischemia on CT and then will also need surgical evaluation.  I think this is really  good for the patient to be able to get understanding of her acute blood loss anemia. Her blood pressure is back to her baseline there is no need for her midodrine we will stop that. Will continue to follow with you    Thomasene Ripple DO, MS [facility] Attending Cardiologist [facility]  [address] [place], [place] [address] [phone] Website: [email or web address]

## 2023-06-14 NOTE — Plan of Care (Signed)
  Problem: Clinical Measurements: Goal: Respiratory complications will improve Outcome: Progressing   Problem: Coping: Goal: Level of anxiety will decrease Outcome: Progressing   

## 2023-06-14 NOTE — Progress Notes (Signed)
PROGRESS NOTE    Amy Mckay  FAO:[id number] DOB: [birth year] DOA: 06/12/2023 PCP: Annita Brod, MD    Brief Narrative:   Amy Mckay is a 88 y.o. female with past medical history significant for COPD on 3L [place] at home, mesenteric ischemia s/p SMA stenting, chronic hyponatremia, CVA, chronic rectal prolapse, hypothyroidism, chronic dysphagia/esophageal dysmotility, chronic pain syndrome who presented to [facility] ED on 12/28 from [facility] with shortness of breath.  Patient also states having rectal pain and noted to have some blood when wiping over the past month On EMS arrival, patient was noted to be hypoxic with SpO2 80% on 4L [place]. Patient was treated with epi pen, solumedrol, nebs, and IV mag. Placed on CPAP; and transported to the ED for further evaluation.    Patient last admitted on 01/2023 w/ proctitis.   In the ED, temperature 97.9 F, HR 124, RR 16, BP 111/85, SpO2 100% on BiPAP.  WBC 19.8, hemoglobin 6.3, platelet count 507.  Sodium 134, potassium 3.9, chloride 93, CO2 26, glucose 274, BUN 18, creatinine 0.92.  BNP 502.9.  High sensitivity troponin 130>166>242>229.  Chest x-ray with diffuse interstitial prominence with patchy bibasilar groundglass airspace disease reflective of pulmonary edema versus atypical infection.CT angiogram chest negative for pulm embolism, areas of interstitial/septal thickening lung bases/anterior lung suspicious for pulmonary edema, atypical infection minimal consolidation/atelectasis dependently in the lower lobes, ill-defined nodular opacities left upper lobe measuring up to 9 mm likely infectious or inflammatory, many of the right lung pulmonary nodules on prior exam unchanged, enlarged right hilar lymph node likely reactive, moderate emphysema.  Patient was given Levaquin.  While in the ED patient became hypotensive and transfuse 1 unit PRBCs.  PCCM consulted for ICU admission.  Significant Hospital events: 12/28: Admit to ICU, platelet  failure on BiPAP, hypotensive requiring Levophed 12/29: Weaned off of Levophed gtt; Cardiology and [facility] consulted 12/30: Transferred to [facility]   Assessment & Plan:   Acute on chronic respiratory failure with hypoxia Patient is laying in the ED with progressive shortness of breath, O2 dependent on 4 L at baseline but SpO2 80% on EMS arrival.  Patient is afebrile with elevated WBC count of 19.8.  COVID/influenza/RSV PCR negative.  Respiratory viral panel negative.  Imaging suggestive of pulmonary edema versus atypical infection.  Initially requiring BiPAP which has been weaned off. -- Continue supplemental oxygen maintain SpO2 greater than 88% -- Treatment as below  Acute systolic congestive heart failure exacerbation Patient presenting with progressive shortness of breath, chest x-ray with diffuse interstitial prominence concerning for pulmonary edema.  BNP elevated 502.9.  TTE with LVEF 40-45%, LV mildly decreased function, with regional wall motion normalities, grade 3 diastolic dysfunction, LA mild/moderately dilated, IVC dilated. 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 Lactic acid 1.9>2.1>1.1.  CT abdomen/pelvis with contrast celiac artery stent patent, similar moderate stenosis of proximal SMA, aortic atherosclerosis. -- [facility] following, appreciate assistance -- Hgb 6.3>7.8>7.2>>7.5>8.7>8.7 -- transfused 2u pRBC on 12/29 -- Holding home aspirin and Brilinta -- Atorvastatin 80 mg p.o. daily -- CBC daily  Atypical viral infection/pneumonia COVID/RSV/influenza A/B PCR negative.  Respiratory viral panel negative. -- WBC 19.8>18.1>17.9 -- Strep pneumo urine antigen: Negative -- Legionella antigen: Pending -- Azithromycin 500 mg IV every  24 hours -- CBC daily  NSTEMI, likely type II demand ischemia in the setting of ABLA -- Cardiology following, appreciate assistance -- Troponin 130>166>242>229 -- TTE LVEF 40-45%, LV mildly decreased function, + LV RWMA, grade 3 diastolic dysfunction, LA mild/moderately dilated, IVC dilated. -- Continue monitoring telemetry  Hypovolemic shock 2/2 acute blood loss anemia Hemoglobin dropped to 6.3 on admission with hypotension requiring initiation of vasopressors.  Patient was transfused 2 unit PRBCs; and subsequently vasopressors have been weaned off. -- Hgb 6.3>7.8>7.2>>7.5>8.7>8.7 -- Protonix 40 mg IV q12hj -- Carafate 1 g p.o. before every meal/at bedtime -- CBC daily  COPD Chronic hypoxic respiratory failure on home O2 4L [place] -- Breo Ellipta 1 puff daily -- Atrovent neb twice daily -- Xopenex neb every 6 hours as needed wheezing/shortness of breath -- Continue supplemental oxygen, maintain SpO2 greater than 88%  Hyponatremia, chronic Na 134>>135; at baseline  History of CVA -- Continue to hold home aspirin and Brilinta in setting of anemia as above -- Atorvastatin 80 mg p.o. daily  Chronic pain syndrome -- Cymbalta 60 mg p.o. daily -- Oxycodone 5 mg p.o. every 4 hours as needed moderate pain -- Morphine 1 mg IV every 3 hours as needed severe pain  Restless leg syndrome -- Pramipexole 0.25 mL p.o. twice daily  Hypothyroidism -- Levothyroxine 5 mcg p.o. daily  Cognitive impairment -- Namenda 10 mg p.o. twice daily -- Supportive care  Left upper lobe nodular opacity 9 mm left upper lobe nodular opacity likely infectious versus inflammatory. --Will need CT follow-up 3 months outpatient  Weakness/ability/deconditioning: From [facility] and rehab. -- PT/OT eval: pending -- TOC consulted   DVT prophylaxis: SCDs Start: 06/13/23 0448    Code Status: Limited: Do not attempt resuscitation (DNR) -DNR-LIMITED -Do Not Intubate/DNI  Family Communication: No  family present bedside this morning  Disposition Plan:  Level of care: Progressive Status is: Inpatient Remains inpatient appropriate because: Pending further workup/plans per specialist, GI/cardiology    Consultants:  [facility] gastroenterology Cardiology  Procedures:  TTE  Antimicrobials:  Azithromycin 12/29>> Levaquin 12/28 - 12/28   Subjective: Patient seen examined bedside, resting calmly.  Lying in bed.  No specific complaints this morning.  Underwent echocardiogram.  Seen by GI and cardiology.  Hemoglobin stable, 8.7 today.  No family present at bedside.  Denies headache, no dizziness, no chest pain, palpitations, no shortness of breath more than her typical baseline, no fever/chills, no nausea/vomiting/diarrhea, no focal weakness, no cough/congestion, no fatigue, no paresthesia.  Negative Thomson overnight per nursing.  Objective: Vitals:   06/14/23 0045 06/14/23 0441 06/14/23 0845 06/14/23 1201  BP: 124/88 122/87 (!) 141/96   Pulse: (!) 114 (!) 113 (!) 56 (!) 115  Resp: 12 17 17  (!) 23  Temp: 98.2 F (36.8 C) 97.9 F (36.6 C) 97.9 F (36.6 C)   TempSrc: Oral Oral Oral   SpO2: 98% 97% 98%   Weight:  66 kg    Height:        Intake/Output Summary (Last 24 hours) at 06/14/2023 1329 Last data filed at 06/14/2023 0000 Gross per 24 hour  Intake 558.41 ml  Output --  Net 558.41 ml   Filed Weights   06/12/23 2340 06/13/23 2205 06/14/23 0441  Weight: 59.9 kg 66 kg 66 kg    Examination:  Physical Exam: GEN: NAD, alert and oriented x 3, chronically ill/elderly in appearance HEENT: NCAT, PERRL, EOMI, sclera clear, MMM PULM: CTAB w/o wheezes/crackles, normal respiratory effort, on 4 L of, which is  her baseline GSP O2 97% at rest CV: RRR w/o M/G/R GI: abd soft, NTND, NABS, no R/G/M MSK: no peripheral edema, moves all extremities independently NEURO: No focal neurological deficit PSYCH: normal mood/affect Integumentary: No concerning rashes/lesions/wounds noted on  exposed skin surfaces.    Data Reviewed: I have personally reviewed following labs and imaging studies  CBC: Recent Labs  Lab 06/12/23 2333 06/12/23 2353 06/13/23 0555 06/13/23 1143 06/13/23 2035 06/14/23 0753  WBC 19.8*  --  18.1*  --   --  17.9*  HGB 6.3* 7.8* 7.2* 7.5* 8.7* 8.7*  HCT 22.3* 23.0* 23.2* 24.0* 26.8* 26.7*  MCV 87.5  --  86.2  --   --  84.0  PLT 507*  --  358  --   --  397   Basic Metabolic Panel: Recent Labs  Lab 06/12/23 2333 06/12/23 2353 06/13/23 0555 06/14/23 0753  NA 134* 134* 135 135  K 3.9 3.8 4.4 3.8  CL 93*  --  96* 94*  CO2 26  --  27 29  GLUCOSE 274*  --  121* 101*  BUN 18  --  18 17  CREATININE 0.92  --  0.84 0.83  CALCIUM 8.8*  --  9.8 9.4  MG  --   --  2.3  --   PHOS  --   --  3.7  --    GFR: Estimated Creatinine Clearance: 42.6 mL/min (by C-G formula based on SCr of 0.83 mg/dL). Liver Function Tests: Recent Labs  Lab 06/12/23 2333  AST 29  ALT 21  ALKPHOS 72  BILITOT 0.4  PROT 7.0  ALBUMIN 3.3*   No results for input(s): "LIPASE", "AMYLASE" in the last 168 hours. No results for input(s): "AMMONIA" in the last 168 hours. Coagulation Profile: Recent Labs  Lab 06/12/23 2333  INR 1.0   Cardiac Enzymes: No results for input(s): "CKTOTAL", "CKMB", "CKMBINDEX", "TROPONINI" in the last 168 hours. BNP (last 3 results) No results for input(s): "PROBNP" in the last 8760 hours. HbA1C: Recent Labs    06/13/23 0555  HGBA1C 5.9*   CBG: Recent Labs  Lab 06/13/23 2204 06/14/23 0045 06/14/23 0443 06/14/23 0844 06/14/23 1132  GLUCAP 195* 104* 108* 105* 114*   Lipid Profile: Recent Labs    06/13/23 0555  CHOL 104  HDL 65  LDLCALC 36  TRIG 15  CHOLHDL 1.6   Thyroid Function Tests: Recent Labs    06/13/23 0555  TSH 0.545  FREET4 1.21*   Anemia Panel: Recent Labs    06/13/23 0555  FERRITIN 5*  TIBC 528*  IRON 96   Sepsis Labs: Recent Labs  Lab 06/13/23 0555 06/13/23 1143 06/13/23 2240 06/14/23 0753   PROCALCITON 0.20  --   --   --   LATICACIDVEN  --  1.9 2.1* 1.1    Recent Results (from the past 240 hours)  Resp panel by RT-PCR (RSV, Flu A&B, Covid) Anterior Nasal Swab     Status: None   Collection Time: 06/13/23  3:44 AM   Specimen: Anterior Nasal Swab  Result Value Ref Range Status   SARS Coronavirus 2 by RT PCR NEGATIVE NEGATIVE Final   Influenza A by PCR NEGATIVE NEGATIVE Final   Influenza B by PCR NEGATIVE NEGATIVE Final    Comment: (NOTE) The Xpert Xpress SARS-CoV-2/FLU/RSV plus assay is intended as an aid in the diagnosis of influenza from Nasopharyngeal swab specimens and should not be used as a sole basis for treatment. Nasal washings and aspirates are unacceptable for  Xpert Xpress SARS-CoV-2/FLU/RSV testing.  Fact Sheet for Patients: BloggerCourse.com  Fact Sheet for Healthcare Providers: SeriousBroker.it  This test is not yet approved or cleared by the [place] FDA and has been authorized for detection and/or diagnosis of SARS-CoV-2 by FDA under an Emergency Use Authorization (EUA). This EUA will remain in effect (meaning this test can be used) for the duration of the COVID-19 declaration under Section 564(b)(1) of the Act, 21 U.S.C. section 360bbb-3(b)(1), unless the authorization is terminated or revoked.     Resp Syncytial Virus by PCR NEGATIVE NEGATIVE Final    Comment: (NOTE) Fact Sheet for Patients: BloggerCourse.com  Fact Sheet for Healthcare Providers: SeriousBroker.it  This test is not yet approved or cleared by the [place] FDA and has been authorized for detection and/or diagnosis of SARS-CoV-2 by FDA under an Emergency Use Authorization (EUA). This EUA will remain in effect (meaning this test can be used) for the duration of the COVID-19 declaration under Section 564(b)(1) of the Act, 21 U.S.C. section 360bbb-3(b)(1), unless the  authorization is terminated or revoked.  Performed at [facility], [address], [place], [place] [address]   MRSA Next Gen by PCR, Nasal     Status: None   Collection Time: 06/14/23  1:11 AM   Specimen: Sputum; Nasal Swab  Result Value Ref Range Status   MRSA by PCR Next Gen NOT DETECTED NOT DETECTED Final    Comment: (NOTE) The GeneXpert MRSA Assay (FDA approved for NASAL specimens only), is one component of a comprehensive MRSA colonization surveillance program. It is not intended to diagnose MRSA infection nor to guide or monitor treatment for MRSA infections. Test performance is not FDA approved in patients less than 83 years old. Performed at [facility], [address], [place], [place] [address]   Respiratory (~20 pathogens) panel by PCR     Status: None   Collection Time: 06/14/23  1:27 AM   Specimen: Nasopharyngeal Swab; Respiratory  Result Value Ref Range Status   Adenovirus NOT DETECTED NOT DETECTED Final   Coronavirus 229E NOT DETECTED NOT DETECTED Final    Comment: (NOTE) The Coronavirus on the Respiratory Panel, DOES NOT test for the novel  Coronavirus (2019 nCoV)    Coronavirus HKU1 NOT DETECTED NOT DETECTED Final   Coronavirus NL63 NOT DETECTED NOT DETECTED Final   Coronavirus OC43 NOT DETECTED NOT DETECTED Final   Metapneumovirus NOT DETECTED NOT DETECTED Final   Rhinovirus / Enterovirus NOT DETECTED NOT DETECTED Final   Influenza A NOT DETECTED NOT DETECTED Final   Influenza B NOT DETECTED NOT DETECTED Final   Parainfluenza Virus 1 NOT DETECTED NOT DETECTED Final   Parainfluenza Virus 2 NOT DETECTED NOT DETECTED Final   Parainfluenza Virus 3 NOT DETECTED NOT DETECTED Final   Parainfluenza Virus 4 NOT DETECTED NOT DETECTED Final   Respiratory Syncytial Virus NOT DETECTED NOT DETECTED Final   Bordetella pertussis NOT DETECTED NOT DETECTED Final   Bordetella Parapertussis NOT DETECTED NOT DETECTED Final   Chlamydophila pneumoniae NOT  DETECTED NOT DETECTED Final   Mycoplasma pneumoniae NOT DETECTED NOT DETECTED Final    Comment: Performed at [facility], [address] [place], [place] [address]         Radiology Studies: ECHOCARDIOGRAM COMPLETE Result Date: 06/14/2023    ECHOCARDIOGRAM REPORT   Patient Name:   JARYIA HYAMS Date of Exam: 06/14/2023 Medical Rec #:  [id number]          Height:  61.3 in Accession #:    [id number]         Weight:       145.5 lb Date of Birth:  08/10/1934          BSA:          1.655 m Patient Age:    88 years           BP:           122/87 mmHg Patient Gender: F                  HR:           116 bpm. Exam Location:  Inpatient Procedure: 2D Echo, 3D Echo, Cardiac Doppler, Color Doppler and Intracardiac            Opacification Agent Indications:     121-121.4 ST elevation (STEMI) and non-ST elevation (NSTEMI)                  nyocardial infarction  History:         Patient has prior history of Echocardiogram examinations, most                  recent 11/04/2022. Previous Myocardial Infarction, Abnormal ECG,                  COPD and Stroke, Signs/Symptoms:Shortness of Breath, Dyspnea,                  Chest Pain and Altered Mental Status; Risk Factors:Current                  Smoker.  Sonographer:     Sheralyn Boatman RDCS Referring Phys:  [id number] PET[address] BABCOCK Diagnosing Phys: Dorthula Nettles IMPRESSIONS  1. Left ventricular ejection fraction, by estimation, is 40%-45%. The left ventricle has moderately decreased function. The left ventricle demonstrates regional wall motion abnormalities (see scoring diagram/findings for description). The left ventricular internal cavity size was moderately dilated. Left ventricular diastolic parameters are consistent with Grade III diastolic dysfunction (restrictive).  2. Right ventricular systolic function is normal. The right ventricular size is normal.  3. Left atrial size was mild to moderately dilated.  4. The mitral valve is grossly normal. Trivial  mitral valve regurgitation. No evidence of mitral stenosis. Moderate mitral annular calcification.  5. Tricuspid valve regurgitation is moderate.  6. The aortic valve is tricuspid. Aortic valve regurgitation is not visualized. No aortic stenosis is present.  7. The inferior vena cava is dilated in size with >50% respiratory variability, suggesting right atrial pressure of 8 mmHg. FINDINGS  Left Ventricle: Left ventricular ejection fraction, by estimation, is 40 to 45%. The left ventricle has mildly decreased function. The left ventricle demonstrates regional wall motion abnormalities. The left ventricular internal cavity size was moderately dilated. There is no left ventricular hypertrophy. Left ventricular diastolic parameters are consistent with Grade III diastolic dysfunction (restrictive). Right Ventricle: The right ventricular size is normal. No increase in right ventricular wall thickness. Right ventricular systolic function is normal. Left Atrium: Left atrial size was mild to moderately dilated. Right Atrium: Right atrial size was normal in size. Pericardium: There is no evidence of pericardial effusion. Mitral Valve: The mitral valve is grossly normal. Moderate mitral annular calcification. Trivial mitral valve regurgitation. No evidence of mitral valve stenosis. MV peak gradient, 15.8 mmHg. The mean mitral valve gradient is 6.5 mmHg. Tricuspid Valve: The tricuspid valve is normal in structure. Tricuspid valve regurgitation is moderate .  No evidence of tricuspid stenosis. Aortic Valve: The aortic valve is tricuspid. Aortic valve regurgitation is not visualized. No aortic stenosis is present. Pulmonic Valve: The pulmonic valve was normal in structure. Pulmonic valve regurgitation is not visualized. No evidence of pulmonic stenosis. Aorta: The aortic root and ascending aorta are structurally normal, with no evidence of dilitation. Venous: The inferior vena cava is dilated in size with greater than 50%  respiratory variability, suggesting right atrial pressure of 8 mmHg. IAS/Shunts: No atrial level shunt detected by color flow Doppler.  LEFT VENTRICLE PLAX 2D LVIDd:         3.65 cm LVIDs:         2.70 cm LV PW:         1.83 cm LV IVS:        0.90 cm LVOT diam:     2.30 cm     3D Volume EF: LV SV:         81          3D EF:        40 % LV SV Index:   49          LV EDV:       118 ml LVOT Area:     4.15 cm    LV ESV:       71 ml                            LV SV:        47 ml  LV Volumes (MOD) LV vol d, MOD A2C: 75.1 ml LV vol d, MOD A4C: 76.0 ml LV vol s, MOD A2C: 55.9 ml LV vol s, MOD A4C: 46.0 ml LV SV MOD A2C:     19.2 ml LV SV MOD A4C:     76.0 ml LV SV MOD BP:      24.3 ml RIGHT VENTRICLE             IVC RV S prime:     10.30 cm/s  IVC diam: 2.10 cm TAPSE (M-mode): 2.0 cm LEFT ATRIUM             Index        RIGHT ATRIUM          Index LA diam:        2.10 cm 1.27 cm/m   RA Area:     9.66 cm LA Vol (A2C):   46.5 ml 28.09 ml/m  RA Volume:   18.40 ml 11.12 ml/m LA Vol (A4C):   36.4 ml 21.99 ml/m LA Biplane Vol: 43.7 ml 26.40 ml/m  AORTIC VALVE LVOT Vmax:   112.00 cm/s LVOT Vmean:  75.733 cm/s LVOT VTI:    0.196 m  AORTA Ao Root diam: 3.00 cm Ao Asc diam:  3.20 cm MITRAL VALVE                TRICUSPID VALVE MV Area (PHT): 7.99 cm     TR Peak grad:   43.0 mmHg MV Area VTI:   3.54 cm     TR Vmax:        328.00 cm/s MV Peak grad:  15.8 mmHg MV Mean grad:  6.5 mmHg     SHUNTS MV Vmax:       1.99 m/s     Systemic VTI:  0.20 m MV Vmean:      114.5 cm/s   Systemic Diam: 2.30 cm  MV Decel Time: 95 msec MV E velocity: 192.00 cm/s Aditya Sabharwal Electronically signed by Dorthula Nettles Signature Date/Time: 06/14/2023/9:37:53 AM    Final (Updated)    CT ABDOMEN PELVIS W CONTRAST Result Date: 06/13/2023 CLINICAL DATA:  Acute onset shortness of breath and rectal pain and blood red blood per rectum EXAM: CT ABDOMEN AND PELVIS WITH CONTRAST TECHNIQUE: Multidetector CT imaging of the abdomen and pelvis was performed  using the standard protocol following bolus administration of intravenous contrast. RADIATION DOSE REDUCTION: This exam was performed according to the departmental dose-optimization program which includes automated exposure control, adjustment of the mA and/or kV according to patient size and/or use of iterative reconstruction technique. CONTRAST:  75mL OMNIPAQUE IOHEXOL 350 MG/ML SOLN COMPARISON:  CT abdomen and pelvis dated 01/28/2023, earlier same day CTA chest FINDINGS: Lower chest: Diffuse interlobular septal thickening. Scattered subpleural ground-glass densities. Increased basilar right lower lobe consolidation. Similar minimal left basilar atelectasis/consolidation. Trace left pleural effusion. Partially imaged heart size is normal. Coronary calcifications. Hepatobiliary: No focal hepatic lesions. No intra or extrahepatic biliary ductal dilation. Normal gallbladder. Pancreas: Unchanged hypodensity in the pancreatic uncinate process measuring up to 1.1 cm (3:36) when measured similarly. No specific follow-up imaging recommended. Spleen: Normal in size without focal abnormality. Adrenals/Urinary Tract: No adrenal nodules. No suspicious renal mass, calculi or hydronephrosis. Excreted contrast material within the urinary bladder. Stomach/Bowel: Small hiatal hernia. Normal appearance of the stomach. Increased mural thickening and mucosal hyperenhancement of the prolapsed rectum. No abnormal bowel dilation. Colonic diverticulosis without acute diverticulitis. Appendix is not discretely seen. Vascular/Lymphatic: Aortic atherosclerosis. Celiac artery stent is patent. 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 The narrow caliber of the stent precludes evaluation of the SMA origin. No enlarged abdominal or pelvic lymph nodes. Reproductive: No adnexal masses. Other: No free fluid, fluid collection, or free air. Musculoskeletal: No acute or abnormal lytic or blastic osseous  lesions. Multilevel degenerative changes of the partially imaged thoracic and lumbar spine. Increased perianal soft tissue stranding. Small fat-containing paraumbilical hernia. IMPRESSION: 1. Increased mural thickening and mucosal hyperenhancement of the prolapsed rectum with increased perianal soft tissue stranding, consistent with proctitis. 2. Increased basilar right lower lobe consolidation, suspicious for atelectasis. 3. Diffuse interlobular septal thickening and scattered subpleural ground-glass densities, which may represent pulmonary edema. 4. Trace left pleural effusion. 5. Celiac artery stent is patent. 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 6. Aortic Atherosclerosis (ICD10-I70.0). Coronary artery calcifications. Assessment for potential risk factor modification, dietary therapy or pharmacologic therapy may be warranted, if clinically indicated. Electronically Signed   By: Agustin Cree M.D.   On: 06/13/2023 13:36   CT Angio Chest Pulmonary Embolism (PE) W or WO Contrast Result Date: 06/13/2023 CLINICAL DATA:  Pulmonary embolism (PE) suspected, high prob Respiratory distress. EXAM: CT ANGIOGRAPHY CHEST WITH CONTRAST TECHNIQUE: Multidetector CT imaging of the chest was performed using the standard protocol during bolus administration of intravenous contrast. Multiplanar CT image reconstructions and MIPs were obtained to evaluate the vascular anatomy. RADIATION DOSE REDUCTION: This exam was performed according to the departmental dose-optimization program which includes automated exposure control, adjustment of the mA and/or kV according to patient size and/or use of iterative reconstruction technique. CONTRAST:  50mL OMNIPAQUE IOHEXOL 350 MG/ML SOLN COMPARISON:  Radiograph yesterday.  Chest CT 10/13/2021 FINDINGS: Cardiovascular: Heart is normal in size. There are no filling defects within the pulmonary arteries to suggest pulmonary embolus. Advanced calcified noncalcified atheromatous plaque  throughout the thoracic aorta. This includes  irregular plaque throughout the descending aorta. No aneurysm or acute aortic findings. 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 There are coronary artery calcifications. No pericardial effusion. Mediastinum/Nodes: Enlarged 14 mm right hilar lymph node no enlarged mediastinal nodes. No thyroid nodule. Small hiatal hernia. Lungs/Pleura: Moderate emphysema. Moderate bronchial thickening. Areas of interstitial and septal thickening primarily involving the lung bases and anterior lungs. Minimal consolidation dependently in the lower lobes. 3 mm right upper lobe nodule series 6, image 53, unchanged. Perifissural 7 mm right lower lobe nodule series 6, image 95, unchanged. One of the right lower lobe nodules on prior has resolved, 1 of the right lower lobe nodules on prior is obscured on the current exam. There are in new ill-defined nodular opacities in the left upper lobe series 6, image 32 measuring 9 mm medially and 6 mm laterally. Ill-defined left upper lobe opacity series 6, image 51, new. Punctate benign granuloma in the left upper lobe. No significant pleural effusion. Upper Abdomen: No acute upper abdominal findings. Musculoskeletal: Mild scoliosis and thoracic spondylosis. There are no acute or suspicious osseous abnormalities. Review of the MIP images confirms the above findings. IMPRESSION: 1. No pulmonary embolus. 2. Areas of interstitial and septal thickening primarily involving the lung bases and anterior lungs, suspicious for pulmonary edema. Atypical infection could have a similar appearance. 3. Minimal consolidation/atelectasis dependently in the lower lobes. 4. New ill-defined nodular opacities in the left upper lobe measuring up to 9 mm, likely infectious or inflammatory. Recommend CT follow-up in 3 months. 5. Many of the right lung pulmonary nodules on prior exam are unchanged. At least 1 of the previous right lung nodules has  resolved. No specific follow-up of these nodules is needed. 6. Enlarged right hilar lymph node, likely reactive. 7. Moderate emphysema. Aortic Atherosclerosis (ICD10-I70.0) and Emphysema (ICD10-J43.9). Electronically Signed   By: Narda Rutherford M.D.   On: 06/13/2023 02:45   DG Chest Port 1 View Result Date: 06/12/2023 CLINICAL DATA:  Short of breath EXAM: PORTABLE CHEST 1 VIEW COMPARISON:  10/13/2021 FINDINGS: Single frontal view of the chest demonstrates a stable cardiac silhouette. Diffuse interstitial opacities are seen throughout the lungs, with patchy bibasilar ground-glass airspace disease. No effusion or pneumothorax. No acute bony abnormalities. IMPRESSION: 1. Diffuse interstitial prominence with patchy bibasilar ground-glass airspace disease, which may reflect pulmonary edema or atypical infection such as viral pneumonitis. Electronically Signed   By: Sharlet Salina M.D.   On: 06/12/2023 23:55        Scheduled Meds:  sodium chloride   Intravenous Once   atorvastatin  80 mg Oral Daily   fluticasone furoate-vilanterol  1 puff Inhalation Daily   insulin aspart  0-9 Units Subcutaneous Q4H   ipratropium  0.5 mg Nebulization BID   levothyroxine  75 mcg Oral QAC breakfast   pantoprazole (PROTONIX) IV  40 mg Intravenous Q12H   polyethylene glycol  17 g Oral Daily   pramipexole  0.25 mg Oral BID   sucralfate  1 g Oral TID WC & HS   Continuous Infusions:  azithromycin Stopped (06/13/23 2314)     LOS: 1 day    Time spent: 54 minutes spent on chart review, discussion with nursing staff, consultants, updating family and interview/physical exam; more than 50% of that time was spent in counseling and/or coordination of care.    Alvira Philips [place], DO [facility] Available via Epic secure chat 7am-7pm After these hours, please refer to coverage provider listed on [email or web address] 06/14/2023, 1:29 PM

## 2023-06-14 NOTE — Plan of Care (Signed)
Care plan reviewed.

## 2023-06-15 ENCOUNTER — Inpatient Hospital Stay (HOSPITAL_COMMUNITY): Payer: 59

## 2023-06-15 DIAGNOSIS — I214 Non-ST elevation (NSTEMI) myocardial infarction: Secondary | ICD-10-CM | POA: Diagnosis not present

## 2023-06-15 LAB — BASIC METABOLIC PANEL
Anion gap: 14 (ref 5–15)
BUN: 21 mg/dL (ref 8–23)
CO2: 29 mmol/L (ref 22–32)
Calcium: 9.1 mg/dL (ref 8.9–10.3)
Chloride: 93 mmol/L — ABNORMAL LOW (ref 98–111)
Creatinine, Ser: 0.85 mg/dL (ref 0.44–1.00)
GFR, Estimated: >60 mL/min (ref >60)
Glucose, Bld: 98 mg/dL (ref 70–99)
Potassium: 3.3 mmol/L — ABNORMAL LOW (ref 3.5–5.1)
Sodium: 136 mmol/L (ref 135–145)

## 2023-06-15 LAB — CBC
HCT: 30.5 % — ABNORMAL LOW (ref 36.0–46.0)
Hemoglobin: 9.9 g/dL — ABNORMAL LOW (ref 12.0–15.0)
MCH: 27.5 pg (ref 26.0–34.0)
MCHC: 32.5 g/dL (ref 30.0–36.0)
MCV: 84.7 fL (ref 80.0–100.0)
Platelets: 437 10*3/uL — ABNORMAL HIGH (ref 150–400)
RBC: 3.6 MIL/uL — ABNORMAL LOW (ref 3.87–5.11)
RDW: 17.2 % — ABNORMAL HIGH (ref 11.5–15.5)
WBC: 14.3 10*3/uL — ABNORMAL HIGH (ref 4.0–10.5)
nRBC: 0.3 % — ABNORMAL HIGH (ref 0.0–0.2)

## 2023-06-15 LAB — GLUCOSE, CAPILLARY
Glucose-Capillary: 104 mg/dL — ABNORMAL HIGH (ref 70–99)
Glucose-Capillary: 110 mg/dL — ABNORMAL HIGH (ref 70–99)
Glucose-Capillary: 146 mg/dL — ABNORMAL HIGH (ref 70–99)
Glucose-Capillary: 91 mg/dL (ref 70–99)
Glucose-Capillary: 95 mg/dL (ref 70–99)

## 2023-06-15 LAB — URINALYSIS, ROUTINE W REFLEX MICROSCOPIC
Bilirubin Urine: NEGATIVE
Glucose, UA: NEGATIVE mg/dL
Ketones, ur: 5 mg/dL — AB
Nitrite: NEGATIVE
Protein, ur: 100 mg/dL — AB
Specific Gravity, Urine: 1.016 (ref 1.005–1.030)
pH: 8 (ref 5.0–8.0)

## 2023-06-15 LAB — LEGIONELLA PNEUMOPHILA SEROGP 1 UR AG: L. pneumophila Serogp 1 Ur Ag: NEGATIVE

## 2023-06-15 MED ORDER — IOHEXOL 350 MG/ML SOLN
75.0000 mL | Freq: Once | INTRAVENOUS | Status: AC | PRN
  Administered 2023-06-15: 75 mL via INTRAVENOUS

## 2023-06-15 MED ORDER — INSULIN ASPART 100 UNIT/ML IJ SOLN
0.0000 [IU] | Freq: Three times a day (TID) | INTRAMUSCULAR | Status: DC
  Administered 2023-06-15 – 2023-06-16 (×2): 1 [IU] via SUBCUTANEOUS

## 2023-06-15 MED ORDER — POTASSIUM CHLORIDE CRYS ER 20 MEQ PO TBCR
40.0000 meq | EXTENDED_RELEASE_TABLET | ORAL | Status: AC
  Administered 2023-06-15 (×2): 40 meq via ORAL
  Filled 2023-06-15 (×2): qty 2

## 2023-06-15 NOTE — Progress Notes (Signed)
 [facility] Progress Note  SUBJECTIVE:   Interval history: Amy Mckay was seen and evaluated today at bedside. Resting supine in bed, no family at bedside. Noted that she is having diffuse abdominal pain as well as back pain. She is having dysuria, suprapubic pain. She has to bear down to urinate. She denied chest pain. Having dizziness when she lays flat. Not able to tolerate oral intake. She noted that abdominal pain may be somewhat improved with use of sucralfate . Having rectal discomfort.   Past Medical History:  Diagnosis Date   Acid reflux    COPD (chronic obstructive pulmonary disease) (HCC)    Hypertension    Past Surgical History:  Procedure Laterality Date   BIOPSY  10/17/2021   Procedure: BIOPSY;  Surgeon: Dianna Specking, MD;  Location: [facility];  Service: Gastroenterology;;   BIOPSY  01/29/2023   Procedure: BIOPSY;  Surgeon: Dianna Specking, MD;  Location: [facility];  Service: Gastroenterology;;   COLONOSCOPY N/A 10/17/2021   Procedure: COLONOSCOPY;  Surgeon: Dianna Specking, MD;  Location: [facility];  Service: Gastroenterology;  Laterality: N/A;   COLONOSCOPY WITH PROPOFOL  N/A 10/18/2021   Procedure: COLONOSCOPY WITH PROPOFOL ;  Surgeon: Elicia Claw, MD;  Location: [facility];  Service: Gastroenterology;  Laterality: N/A;   ESOPHAGOGASTRODUODENOSCOPY N/A 10/17/2021   Procedure: ESOPHAGOGASTRODUODENOSCOPY (EGD);  Surgeon: Dianna Specking, MD;  Location: [facility];  Service: Gastroenterology;  Laterality: N/A;   FLEXIBLE SIGMOIDOSCOPY N/A 01/29/2023   Procedure: FLEXIBLE SIGMOIDOSCOPY;  Surgeon: Dianna Specking, MD;  Location: [facility];  Service: Gastroenterology;  Laterality: N/A;   POLYPECTOMY  10/18/2021   Procedure: POLYPECTOMY;  Surgeon: Elicia Claw, MD;  Location: [facility];  Service: Gastroenterology;;   Current Facility-Administered Medications  Medication Dose Route Frequency Provider Last Rate Last Admin   0.9 %   sodium chloride  infusion (Manually program via Guardrails IV Fluids)   Intravenous Once Jenna Maude BRAVO, NP       atorvastatin  (LIPITOR ) tablet 80 mg  80 mg Oral Daily Babcock, Peter E, NP   80 mg at 06/15/23 0914   azithromycin  (ZITHROMAX ) 500 mg in sodium chloride  0.9 % 250 mL IVPB  500 mg Intravenous QHS Babcock, Peter E, NP 250 mL/hr at 06/14/23 2133 500 mg at 06/14/23 2133   docusate sodium  (COLACE) capsule 100 mg  100 mg Oral BID PRN Babcock, Peter E, NP       DULoxetine  (CYMBALTA ) DR capsule 60 mg  60 mg Oral Daily Austria, Eric J, DO   60 mg at 06/14/23 1705   fluticasone  furoate-vilanterol (BREO ELLIPTA ) 200-25 MCG/ACT 1 puff  1 puff Inhalation Daily Jenna Maude BRAVO, NP       HYDROmorphone  (DILAUDID ) injection 0.5 mg  0.5 mg Intravenous Q3H PRN Elmaghraby, Zaki, MD   0.5 mg at 06/14/23 1826   insulin  aspart (novoLOG ) injection 0-9 Units  0-9 Units Subcutaneous TID WC Austria, Eric J, DO       ipratropium (ATROVENT ) nebulizer solution 0.5 mg  0.5 mg Nebulization BID Austria, Eric J, DO   0.5 mg at 06/14/23 2012   levalbuterol  (XOPENEX ) nebulizer solution 0.63 mg  0.63 mg Nebulization Q6H PRN Babcock, Peter E, NP   0.63 mg at 06/13/23 2154   levothyroxine  (SYNTHROID ) tablet 75 mcg  75 mcg Oral QAC breakfast Babcock, Peter E, NP   75 mcg at 06/15/23 9085   memantine  (NAMENDA ) tablet 10 mg  10 mg Oral BID Austria, Eric J, DO   10 mg at 06/15/23 0914   morphine  (PF) 2  MG/ML injection 1 mg  1 mg Intravenous Q3H PRN Austria, Eric J, DO   1 mg at 06/15/23 0031   ondansetron  (ZOFRAN ) injection 4 mg  4 mg Intravenous Q6H PRN Laveda Roosevelt, MD   4 mg at 06/14/23 2053   oxyCODONE  (Oxy IR/ROXICODONE ) immediate release tablet 5-10 mg  5-10 mg Oral Q4H PRN Austria, Eric J, DO       pantoprazole  (PROTONIX ) injection 40 mg  40 mg Intravenous Q12H Babcock, Peter E, NP   40 mg at 06/15/23 0914   polyethylene glycol (MIRALAX  / GLYCOLAX ) packet 17 g  17 g Oral Daily Kriss Estefana DEL, DO   17 g at 06/15/23  9085   potassium chloride  SA (KLOR-CON  M) CR tablet 40 mEq  40 mEq Oral Q3H Austria, Eric J, DO   40 mEq at 06/15/23 9085   pramipexole  (MIRAPEX ) tablet 0.25 mg  0.25 mg Oral BID Babcock, Peter E, NP   0.25 mg at 06/15/23 9083   sucralfate  (CARAFATE ) 1 GM/10ML suspension 1 g  1 g Oral TID WC & HS Kriss Estefana H, DO   1 g at 06/14/23 1705   Allergies as of 06/12/2023 - Review Complete 06/12/2023  Allergen Reaction Noted   Penicillins Anaphylaxis, Swelling, and Other (See Comments) 05/14/2016   Penicillin g Hives 11/03/2022   Review of Systems:  Review of Systems  Respiratory:  Positive for shortness of breath.   Cardiovascular:  Negative for chest pain.  Gastrointestinal:  Positive for abdominal pain.  Neurological:  Positive for dizziness.    OBJECTIVE:   Temp:  [97.9 F (36.6 C)-99.4 F (37.4 C)] 98.4 F (36.9 C) (12/31 0357) Pulse Rate:  [43-117] 112 (12/31 0357) Resp:  [16-23] 18 (12/31 0357) BP: (95-136)/(83-92) 107/86 (12/31 0357) SpO2:  [96 %-100 %] 100 % (12/31 0357) FiO2 (%):  [40 %] 40 % (12/30 1402) Weight:  [60.6 kg] 60.6 kg (12/31 0503) Last BM Date : 06/13/23 Physical Exam Constitutional:      General: She is not in acute distress.    Appearance: She is not ill-appearing, toxic-appearing or diaphoretic.  Cardiovascular:     Rate and Rhythm: Tachycardia present.  Pulmonary:     Effort: No respiratory distress.     Breath sounds: Normal breath sounds.     Comments: Supplemental oxygen nasal cannula Abdominal:     General: Bowel sounds are normal. There is no distension.     Palpations: Abdomen is soft.     Tenderness: There is abdominal tenderness (suprapubic). There is no guarding.  Skin:    General: Skin is warm and dry.  Neurological:     Mental Status: She is alert.     Labs: Recent Labs    06/13/23 0555 06/13/23 1143 06/13/23 2035 06/14/23 0753 06/15/23 0742  WBC 18.1*  --   --  17.9* 14.3*  HGB 7.2*   < > 8.7* 8.7* 9.9*  HCT 23.2*   < >  26.8* 26.7* 30.5*  PLT 358  --   --  397 437*   < > = values in this interval not displayed.   BMET Recent Labs    06/13/23 0555 06/14/23 0753 06/15/23 0307  NA 135 135 136  K 4.4 3.8 3.3*  CL 96* 94* 93*  CO2 27 29 29   GLUCOSE 121* 101* 98  BUN 18 17 21   CREATININE 0.84 0.83 0.85  CALCIUM  9.8 9.4 9.1   LFT Recent Labs    06/12/23 2333  PROT 7.0  ALBUMIN  3.3*  AST 29  ALT 21  ALKPHOS 72  BILITOT 0.4   PT/INR Recent Labs    06/12/23 2333  LABPROT 13.0  INR 1.0   Diagnostic imaging: ECHOCARDIOGRAM COMPLETE Result Date: 06/14/2023    ECHOCARDIOGRAM REPORT   Patient Name:   ROLONDA PONTARELLI Date of Exam: 06/14/2023 Medical Rec #:  [id number]          Height:       61.3 in Accession #:    [id number]         Weight:       145.5 lb Date of Birth:  12/29/1934          BSA:          1.655 m Patient Age:    88 years           BP:           122/87 mmHg Patient Gender: F                  HR:           116 bpm. Exam Location:  Inpatient Procedure: 2D Echo, 3D Echo, Cardiac Doppler, Color Doppler and Intracardiac            Opacification Agent Indications:     121-121.4 ST elevation (STEMI) and non-ST elevation (NSTEMI)                  nyocardial infarction  History:         Patient has prior history of Echocardiogram examinations, most                  recent 11/04/2022. Previous Myocardial Infarction, Abnormal ECG,                  COPD and Stroke, Signs/Symptoms:Shortness of Breath, Dyspnea,                  Chest Pain and Altered Mental Status; Risk Factors:Current                  Smoker.  Sonographer:     Ellouise Mose RDCS Referring Phys:  [id number] PETER E BABCOCK Diagnosing Phys: Ria Commander IMPRESSIONS  1. Left ventricular ejection fraction, by estimation, is 40%-45%. The left ventricle has moderately decreased function. The left ventricle demonstrates regional wall motion abnormalities (see scoring diagram/findings for description). The left ventricular internal cavity size was  moderately dilated. Left ventricular diastolic parameters are consistent with Grade III diastolic dysfunction (restrictive).  2. Right ventricular systolic function is normal. The right ventricular size is normal.  3. Left atrial size was mild to moderately dilated.  4. The mitral valve is grossly normal. Trivial mitral valve regurgitation. No evidence of mitral stenosis. Moderate mitral annular calcification.  5. Tricuspid valve regurgitation is moderate.  6. The aortic valve is tricuspid. Aortic valve regurgitation is not visualized. No aortic stenosis is present.  7. The inferior vena cava is dilated in size with >50% respiratory variability, suggesting right atrial pressure of 8 mmHg. FINDINGS  Left Ventricle: Left ventricular ejection fraction, by estimation, is 40 to 45%. The left ventricle has mildly decreased function. The left ventricle demonstrates regional wall motion abnormalities. The left ventricular internal cavity size was moderately dilated. There is no left ventricular hypertrophy. Left ventricular diastolic parameters are consistent with Grade III diastolic dysfunction (restrictive). Right Ventricle: The right ventricular size is normal. No increase in right ventricular wall thickness. Right ventricular systolic function is  normal. Left Atrium: Left atrial size was mild to moderately dilated. Right Atrium: Right atrial size was normal in size. Pericardium: There is no evidence of pericardial effusion. Mitral Valve: The mitral valve is grossly normal. Moderate mitral annular calcification. Trivial mitral valve regurgitation. No evidence of mitral valve stenosis. MV peak gradient, 15.8 mmHg. The mean mitral valve gradient is 6.5 mmHg. Tricuspid Valve: The tricuspid valve is normal in structure. Tricuspid valve regurgitation is moderate . No evidence of tricuspid stenosis. Aortic Valve: The aortic valve is tricuspid. Aortic valve regurgitation is not visualized. No aortic stenosis is present.  Pulmonic Valve: The pulmonic valve was normal in structure. Pulmonic valve regurgitation is not visualized. No evidence of pulmonic stenosis. Aorta: The aortic root and ascending aorta are structurally normal, with no evidence of dilitation. Venous: The inferior vena cava is dilated in size with greater than 50% respiratory variability, suggesting right atrial pressure of 8 mmHg. IAS/Shunts: No atrial level shunt detected by color flow Doppler.  LEFT VENTRICLE PLAX 2D LVIDd:         3.65 cm LVIDs:         2.70 cm LV PW:         1.83 cm LV IVS:        0.90 cm LVOT diam:     2.30 cm     3D Volume EF: LV SV:         81          3D EF:        40 % LV SV Index:   49          LV EDV:       118 ml LVOT Area:     4.15 cm    LV ESV:       71 ml                            LV SV:        47 ml  LV Volumes (MOD) LV vol d, MOD A2C: 75.1 ml LV vol d, MOD A4C: 76.0 ml LV vol s, MOD A2C: 55.9 ml LV vol s, MOD A4C: 46.0 ml LV SV MOD A2C:     19.2 ml LV SV MOD A4C:     76.0 ml LV SV MOD BP:      24.3 ml RIGHT VENTRICLE             IVC RV S prime:     10.30 cm/s  IVC diam: 2.10 cm TAPSE (M-mode): 2.0 cm LEFT ATRIUM             Index        RIGHT ATRIUM          Index LA diam:        2.10 cm 1.27 cm/m   RA Area:     9.66 cm LA Vol (A2C):   46.5 ml 28.09 ml/m  RA Volume:   18.40 ml 11.12 ml/m LA Vol (A4C):   36.4 ml 21.99 ml/m LA Biplane Vol: 43.7 ml 26.40 ml/m  AORTIC VALVE LVOT Vmax:   112.00 cm/s LVOT Vmean:  75.733 cm/s LVOT VTI:    0.196 m  AORTA Ao Root diam: 3.00 cm Ao Asc diam:  3.20 cm MITRAL VALVE                TRICUSPID VALVE MV Area (PHT): 7.99 cm     TR Peak  grad:   43.0 mmHg MV Area VTI:   3.54 cm     TR Vmax:        328.00 cm/s MV Peak grad:  15.8 mmHg MV Mean grad:  6.5 mmHg     SHUNTS MV Vmax:       1.99 m/s     Systemic VTI:  0.20 m MV Vmean:      114.5 cm/s   Systemic Diam: 2.30 cm MV Decel Time: 95 msec MV E velocity: 192.00 cm/s Aditya Sabharwal Electronically signed by Ria Commander Signature Date/Time:  06/14/2023/9:37:53 AM    Final (Updated)    CT ABDOMEN PELVIS W CONTRAST Result Date: 06/13/2023 CLINICAL DATA:  Acute onset shortness of breath and rectal pain and blood red blood per rectum EXAM: CT ABDOMEN AND PELVIS WITH CONTRAST TECHNIQUE: Multidetector CT imaging of the abdomen and pelvis was performed using the standard protocol following bolus administration of intravenous contrast. RADIATION DOSE REDUCTION: This exam was performed according to the departmental dose-optimization program which includes automated exposure control, adjustment of the mA and/or kV according to patient size and/or use of iterative reconstruction technique. CONTRAST:  75mL OMNIPAQUE  IOHEXOL  350 MG/ML SOLN COMPARISON:  CT abdomen and pelvis dated 01/28/2023, earlier same day CTA chest FINDINGS: Lower chest: Diffuse interlobular septal thickening. Scattered subpleural ground-glass densities. Increased basilar right lower lobe consolidation. Similar minimal left basilar atelectasis/consolidation. Trace left pleural effusion. Partially imaged heart size is normal. Coronary calcifications. Hepatobiliary: No focal hepatic lesions. No intra or extrahepatic biliary ductal dilation. Normal gallbladder. Pancreas: Unchanged hypodensity in the pancreatic uncinate process measuring up to 1.1 cm (3:36) when measured similarly. No specific follow-up imaging recommended. Spleen: Normal in size without focal abnormality. Adrenals/Urinary Tract: No adrenal nodules. No suspicious renal mass, calculi or hydronephrosis. Excreted contrast material within the urinary bladder. Stomach/Bowel: Small hiatal hernia. Normal appearance of the stomach. Increased mural thickening and mucosal hyperenhancement of the prolapsed rectum. No abnormal bowel dilation. Colonic diverticulosis without acute diverticulitis. Appendix is not discretely seen. Vascular/Lymphatic: Aortic atherosclerosis. Celiac artery stent is patent. Moderate stenosis of the proximal SMA  distal to the stent due to atherosclerotic plaque. The narrow caliber of the stent precludes evaluation of the SMA origin. No enlarged abdominal or pelvic lymph nodes. Reproductive: No adnexal masses. Other: No free fluid, fluid collection, or free air. Musculoskeletal: No acute or abnormal lytic or blastic osseous lesions. Multilevel degenerative changes of the partially imaged thoracic and lumbar spine. Increased perianal soft tissue stranding. Small fat-containing paraumbilical hernia. IMPRESSION: 1. Increased mural thickening and mucosal hyperenhancement of the prolapsed rectum with increased perianal soft tissue stranding, consistent with proctitis. 2. Increased basilar right lower lobe consolidation, suspicious for atelectasis. 3. Diffuse interlobular septal thickening and scattered subpleural ground-glass densities, which may represent pulmonary edema. 4. Trace left pleural effusion. 5. Celiac artery stent is patent. 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 6. Aortic Atherosclerosis (ICD10-I70.0). Coronary artery calcifications. Assessment for potential risk factor modification, dietary therapy or pharmacologic therapy may be warranted, if clinically indicated. Electronically Signed   By: Limin  Xu M.D.   On: 06/13/2023 13:36   IMPRESSION: Epigastric abdominal pain             -CTAP with no signs of ischemia   -Somewhat improved with sucralfate  (?) history difficult to obtain Abdominal atherosclerosis  -Last CT angiogram of abdomen 01/29/21 showing patent ostial celiac stent NSTEMI, suspected from demand ischemia Rectal prolapse, chronic, stable in size  Lactic acidosis, resolved  COPD, on supplemental  oxygen  Hypertension    PLAN: -Etiology of abdominal pain likely multifactorial, abdominal atherosclerosis/chronic mesenteric ischemia versus peptic ulcer disease versus suprapubic pain related to UTI versus other -Symptoms somewhat improved with use of sucralfate  per patient report, though  she is still requesting pain medications from me today at bedside -Recommend updating CT angiogram of abdomen and pelvis to determine if any progression of atherosclerosis  -Continue IV PPI, sucralfate  -Supportive care for rectal prolapse, would require cardiac and pulmonary clearance for any surgical procedure -Diet as tolerated -Can consider EGD on 06/17/23 if symptoms persist -[facility] will follow    LOS: 2 days   Estefana Keas, [facility]

## 2023-06-15 NOTE — Progress Notes (Addendum)
 PROGRESS NOTE    Karrisa Didio  FMW:[id number] DOB: August 07, 1934 DOA: 06/12/2023 PCP: Karlene Mungo, MD    Brief Narrative:   Rily Nickey is a 88 y.o. female with past medical history significant for COPD on 3L [place] at home, mesenteric ischemia s/p SMA stenting, chronic hyponatremia, CVA, chronic rectal prolapse, hypothyroidism, chronic dysphagia/esophageal dysmotility, chronic pain syndrome who presented to [facility] ED on 12/28 from [facility] with shortness of breath.  Patient also states having rectal pain and noted to have some blood when wiping over the past month On EMS arrival, patient was noted to be hypoxic with SpO2 80% on 4L . Patient was treated with epi pen, solumedrol, nebs, and IV mag. Placed on CPAP; and transported to the ED for further evaluation.    Patient last admitted on 01/2023 w/ proctitis.   In the ED, temperature 97.9 F, HR 124, RR 16, BP 111/85, SpO2 100% on BiPAP.  WBC 19.8, hemoglobin 6.3, platelet count 507.  Sodium 134, potassium 3.9, chloride 93, CO2 26, glucose 274, BUN 18, creatinine 0.92.  BNP 502.9.  High sensitivity troponin 130>166>242>229.  Chest x-ray with diffuse interstitial prominence with patchy bibasilar groundglass airspace disease reflective of pulmonary edema versus atypical infection.CT angiogram chest negative for pulm embolism, areas of interstitial/septal thickening lung bases/anterior lung suspicious for pulmonary edema, atypical infection minimal consolidation/atelectasis dependently in the lower lobes, ill-defined nodular opacities left upper lobe measuring up to 9 mm likely infectious or inflammatory, many of the right lung pulmonary nodules on prior exam unchanged, enlarged right hilar lymph node likely reactive, moderate emphysema.  Patient was given Levaquin .  While in the ED patient became hypotensive and transfuse 1 unit PRBCs.  PCCM consulted for ICU admission.  Significant Hospital events: 12/28: Admit to ICU, platelet  failure on BiPAP, hypotensive requiring Levophed  12/29: Weaned off of Levophed  gtt; Cardiology and [facility] consulted 12/30: Transferred to [facility] 12/31: Patient, complaining of abdominal discomfort, rectal prolapse, hemoglobin stable, GI ordered CTA A/P; cardiology with no plans for additional testing   Assessment & Plan:   Acute on chronic respiratory failure with hypoxia Patient is laying in the ED with progressive shortness of breath, O2 dependent on 4 L at baseline but SpO2 80% on EMS arrival.  Patient is afebrile with elevated WBC count of 19.8.  COVID/influenza/RSV PCR negative.  Respiratory viral panel negative.  Imaging suggestive of pulmonary edema versus atypical infection.  Initially requiring BiPAP which has been weaned off. -- Continue supplemental oxygen maintain SpO2 greater than 88% -- Treatment as below  Acute systolic congestive heart failure exacerbation Patient presenting with progressive shortness of breath, chest x-ray with diffuse interstitial prominence concerning for pulmonary edema.  BNP elevated 502.9.  TTE with LVEF 40-45%, LV mildly decreased function, with regional wall motion normalities, grade 3 diastolic dysfunction, LA mild/moderately dilated, IVC dilated. 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 Lactic acid 1.9>2.1>1.1.  CT abdomen/pelvis with contrast celiac artery stent patent, similar moderate stenosis of proximal SMA, aortic atherosclerosis. -- [facility] following, appreciate assistance -- Hgb 6.3>7.8>7.2>>7.5>8.7>8.7>9.9 -- transfused 2u pRBC on 12/29 -- Holding home aspirin  and Brilinta  -- Atorvastatin  80 mg p.o. daily -- CTA A/P pending today -- GI to consider EGD>>?06/17/2023 -- CBC daily  Atypical viral  infection/pneumonia COVID/RSV/influenza A/B PCR negative.  Respiratory viral panel negative. -- WBC 19.8>18.1>17.9>14.3 -- Strep pneumo urine antigen: Negative -- Legionella antigen: Pending -- Azithromycin  500 mg IV every 24 hours -- CBC daily  NSTEMI, likely type II demand ischemia in the setting of ABLA -- Cardiology following, appreciate assistance -- Troponin 130>166>242>229 -- TTE LVEF 40-45%, LV mildly decreased function, + LV RWMA, grade 3 diastolic dysfunction, LA mild/moderately dilated, IVC dilated. -- Continue monitoring telemetry  Rectal prolapse, chronic -- Colace 100 mg p.o. twice daily -- MiraLAX  daily -- Supportive care -- Unlikely to tolerate surgical procedure for management given her comorbidities and advanced age, -- Further per GI  Hypovolemic shock 2/2 acute blood loss anemia Hemoglobin dropped to 6.3 on admission with hypotension requiring initiation of vasopressors.  Patient was transfused 2 unit PRBCs; and subsequently vasopressors have been weaned off. -- Hgb 6.3>7.8>7.2>>7.5>8.7>8.7>9.9 -- Protonix  40 mg IV q12h -- Carafate  1 g p.o. before every meal/at bedtime -- CBC daily  COPD Chronic hypoxic respiratory failure on home O2 4L [place] -- Breo Ellipta  1 puff daily -- Atrovent  neb twice daily -- Xopenex  neb every 6 hours as needed wheezing/shortness of breath -- Continue supplemental oxygen, maintain SpO2 greater than 88%  Hyponatremia, chronic Na 134>>135>136; at baseline  Hypokalemia Potassium 3.3, will replete. -- Repeat electrolytes and magnesium  History of CVA -- Continue to hold home aspirin  and Brilinta  in setting of anemia as above -- Atorvastatin  80 mg p.o. daily  Chronic pain syndrome -- Cymbalta  60 mg p.o. daily -- Oxycodone  5 mg p.o. every 4 hours as needed moderate pain -- Morphine  1 mg IV every 3 hours as needed severe pain  Restless leg syndrome -- Pramipexole  0.25 mL p.o. twice daily  Hypothyroidism -- Levothyroxine  5 mcg  p.o. daily  Cognitive impairment -- Namenda  10 mg p.o. twice daily -- Supportive care  Left upper lobe nodular opacity 9 mm left upper lobe nodular opacity likely infectious versus inflammatory. --Will need CT follow-up 3 months outpatient  Weakness/ability/deconditioning: From [facility] and rehab. -- PT eval: pending -- OT recommending SNF -- TOC consulted   DVT prophylaxis: SCDs Start: 06/13/23 0448    Code Status: Limited: Do not attempt resuscitation (DNR) -DNR-LIMITED -Do Not Intubate/DNI  Family Communication: No family present bedside this morning  Disposition Plan:  Level of care: Progressive Status is: Inpatient Remains inpatient appropriate because: Pending further workup/plans per specialist, GI/cardiology    Consultants:  [facility] Main gastroenterology Cardiology  Procedures:  TTE  Antimicrobials:  Azithromycin  12/29>> Levaquin  12/28 - 12/28   Subjective: Patient seen examined bedside, resting calmly.  Lying in bed.  Complaining of abdominal discomfort.  Seen by GI and cardiology.  GI plans CT angiogram abdomen/pelvis today.  Cardiology with no further recommendations or plans for invasive procedures at this time.  Hemoglobin up to 9.9.   No family present at bedside.  Denies headache, no dizziness, no chest pain, palpitations, no shortness of breath more than her typical baseline, no fever/chills, no nausea/vomiting/diarrhea, no focal weakness, no cough/congestion, no fatigue, no paresthesia.  RN concerned about rectal prolapse which is chronic, otherwise no other concerns issues overnight per nursing staff.  Objective: Vitals:   06/14/23 2012 06/15/23 0011 06/15/23 0357 06/15/23 0503  BP: (!) 131/92 95/83 107/86   Pulse: (!) 43 (!) 112 (!) 112   Resp: 19 16 18    Temp: 99.4 F (37.4 C) 98.4 F (36.9 C) 98.4 F (36.9 C)   TempSrc: Axillary Oral Oral  SpO2: 100% 99% 100%   Weight:    60.6 kg  Height:        Intake/Output Summary (Last 24 hours)  at 06/15/2023 1202 Last data filed at 06/15/2023 0155 Gross per 24 hour  Intake --  Output 3500 ml  Net -3500 ml   Filed Weights   06/13/23 2205 06/14/23 0441 06/15/23 0503  Weight: 66 kg 66 kg 60.6 kg    Examination:  Physical Exam: GEN: NAD, alert and oriented x 3, chronically ill/elderly in appearance HEENT: NCAT, PERRL, EOMI, sclera clear, MMM PULM: CTAB w/o wheezes/crackles, normal respiratory effort, on 4 L of, which is her baseline GSP O2 97% at rest CV: RRR w/o M/G/R GI: abd soft, NTND, NABS, no R/G/M MSK: no peripheral edema, moves all extremities independently NEURO: No focal neurological deficit PSYCH: normal mood/affect Integumentary: No concerning rashes/lesions/wounds noted on exposed skin surfaces.    Data Reviewed: I have personally reviewed following labs and imaging studies  CBC: Recent Labs  Lab 06/12/23 2333 06/12/23 2353 06/13/23 0555 06/13/23 1143 06/13/23 2035 06/14/23 0753 06/15/23 0742  WBC 19.8*  --  18.1*  --   --  17.9* 14.3*  HGB 6.3*   < > 7.2* 7.5* 8.7* 8.7* 9.9*  HCT 22.3*   < > 23.2* 24.0* 26.8* 26.7* 30.5*  MCV 87.5  --  86.2  --   --  84.0 84.7  PLT 507*  --  358  --   --  397 437*   < > = values in this interval not displayed.   Basic Metabolic Panel: Recent Labs  Lab 06/12/23 2333 06/12/23 2353 06/13/23 0555 06/14/23 0753 06/15/23 0307  NA 134* 134* 135 135 136  K 3.9 3.8 4.4 3.8 3.3*  CL 93*  --  96* 94* 93*  CO2 26  --  27 29 29   GLUCOSE 274*  --  121* 101* 98  BUN 18  --  18 17 21   CREATININE 0.92  --  0.84 0.83 0.85  CALCIUM  8.8*  --  9.8 9.4 9.1  MG  --   --  2.3  --   --   PHOS  --   --  3.7  --   --    GFR: Estimated Creatinine Clearance: 40 mL/min (by C-G formula based on SCr of 0.85 mg/dL). Liver Function Tests: Recent Labs  Lab 06/12/23 2333  AST 29  ALT 21  ALKPHOS 72  BILITOT 0.4  PROT 7.0  ALBUMIN 3.3*   No results for input(s): LIPASE, AMYLASE in the last 168 hours. No results for  input(s): AMMONIA in the last 168 hours. Coagulation Profile: Recent Labs  Lab 06/12/23 2333  INR 1.0   Cardiac Enzymes: No results for input(s): CKTOTAL, CKMB, CKMBINDEX, TROPONINI in the last 168 hours. BNP (last 3 results) No results for input(s): PROBNP in the last 8760 hours. HbA1C: Recent Labs    06/13/23 0555  HGBA1C 5.9*   CBG: Recent Labs  Lab 06/14/23 1649 06/14/23 2028 06/15/23 0027 06/15/23 0356 06/15/23 1146  GLUCAP 86 112* 110* 104* 95   Lipid Profile: Recent Labs    06/13/23 0555  CHOL 104  HDL 65  LDLCALC 36  TRIG 15  CHOLHDL 1.6   Thyroid Function Tests: Recent Labs    06/13/23 0555  TSH 0.545  FREET4 1.21*   Anemia Panel: Recent Labs    06/13/23 0555  FERRITIN 5*  TIBC 528*  IRON 96   Sepsis Labs: Recent Labs  Lab  06/13/23 0555 06/13/23 1143 06/13/23 2240 06/14/23 0753  PROCALCITON 0.20  --   --   --   LATICACIDVEN  --  1.9 2.1* 1.1    Recent Results (from the past 240 hours)  Resp panel by RT-PCR (RSV, Flu A&B, Covid) Anterior Nasal Swab     Status: None   Collection Time: 06/13/23  3:44 AM   Specimen: Anterior Nasal Swab  Result Value Ref Range Status   SARS Coronavirus 2 by RT PCR NEGATIVE NEGATIVE Final   Influenza A by PCR NEGATIVE NEGATIVE Final   Influenza B by PCR NEGATIVE NEGATIVE Final    Comment: (NOTE) The Xpert Xpress SARS-CoV-2/FLU/RSV plus assay is intended as an aid in the diagnosis of influenza from Nasopharyngeal swab specimens and should not be used as a sole basis for treatment. Nasal washings and aspirates are unacceptable for Xpert Xpress SARS-CoV-2/FLU/RSV testing.  Fact Sheet for Patients: bloggercourse.com  Fact Sheet for Healthcare Providers: seriousbroker.it  This test is not yet approved or cleared by the [place]  FDA and has been authorized for detection and/or diagnosis of SARS-CoV-2 by FDA under an Emergency Use  Authorization (EUA). This EUA will remain in effect (meaning this test can be used) for the duration of the COVID-19 declaration under Section 564(b)(1) of the Act, 21 U.S.C. section 360bbb-3(b)(1), unless the authorization is terminated or revoked.     Resp Syncytial Virus by PCR NEGATIVE NEGATIVE Final    Comment: (NOTE) Fact Sheet for Patients: bloggercourse.com  Fact Sheet for Healthcare Providers: seriousbroker.it  This test is not yet approved or cleared by the [place]  FDA and has been authorized for detection and/or diagnosis of SARS-CoV-2 by FDA under an Emergency Use Authorization (EUA). This EUA will remain in effect (meaning this test can be used) for the duration of the COVID-19 declaration under Section 564(b)(1) of the Act, 21 U.S.C. section 360bbb-3(b)(1), unless the authorization is terminated or revoked.  Performed at [facility], [address] [place], [place] [address]   MRSA Next Gen by PCR, Nasal     Status: None   Collection Time: 06/14/23  1:11 AM   Specimen: Sputum; Nasal Swab  Result Value Ref Range Status   MRSA by PCR Next Gen NOT DETECTED NOT DETECTED Final    Comment: (NOTE) The GeneXpert MRSA Assay (FDA approved for NASAL specimens only), is one component of a comprehensive MRSA colonization surveillance program. It is not intended to diagnose MRSA infection nor to guide or monitor treatment for MRSA infections. Test performance is not FDA approved in patients less than 16 years old. Performed at [facility], [address], [place], [place] [address]   Respiratory (~20 pathogens) panel by PCR     Status: None   Collection Time: 06/14/23  1:27 AM   Specimen: Nasopharyngeal Swab; Respiratory  Result Value Ref Range Status   Adenovirus NOT DETECTED NOT DETECTED Final   Coronavirus 229E NOT DETECTED NOT DETECTED Final    Comment: (NOTE) The Coronavirus on the Respiratory  Panel, DOES NOT test for the novel  Coronavirus (2019 nCoV)    Coronavirus HKU1 NOT DETECTED NOT DETECTED Final   Coronavirus NL63 NOT DETECTED NOT DETECTED Final   Coronavirus OC43 NOT DETECTED NOT DETECTED Final   Metapneumovirus NOT DETECTED NOT DETECTED Final   Rhinovirus / Enterovirus NOT DETECTED NOT DETECTED Final   Influenza A NOT DETECTED NOT DETECTED Final   Influenza B NOT DETECTED NOT DETECTED Final   Parainfluenza Virus  1 NOT DETECTED NOT DETECTED Final   Parainfluenza Virus 2 NOT DETECTED NOT DETECTED Final   Parainfluenza Virus 3 NOT DETECTED NOT DETECTED Final   Parainfluenza Virus 4 NOT DETECTED NOT DETECTED Final   Respiratory Syncytial Virus NOT DETECTED NOT DETECTED Final   Bordetella pertussis NOT DETECTED NOT DETECTED Final   Bordetella Parapertussis NOT DETECTED NOT DETECTED Final   Chlamydophila pneumoniae NOT DETECTED NOT DETECTED Final   Mycoplasma pneumoniae NOT DETECTED NOT DETECTED Final    Comment: Performed at [facility], [address] [place], [place] [address]         Radiology Studies: ECHOCARDIOGRAM COMPLETE Result Date: 06/14/2023    ECHOCARDIOGRAM REPORT   Patient Name:   JAMIYLA ISHEE Date of Exam: 06/14/2023 Medical Rec #:  [id number]          Height:       61.3 in Accession #:    [id number]         Weight:       145.5 lb Date of Birth:  01-19-1935          BSA:          1.655 m Patient Age:    88 years           BP:           122/87 mmHg Patient Gender: F                  HR:           116 bpm. Exam Location:  Inpatient Procedure: 2D Echo, 3D Echo, Cardiac Doppler, Color Doppler and Intracardiac            Opacification Agent Indications:     121-121.4 ST elevation (STEMI) and non-ST elevation (NSTEMI)                  nyocardial infarction  History:         Patient has prior history of Echocardiogram examinations, most                  recent 11/04/2022. Previous Myocardial Infarction, Abnormal ECG,                  COPD and Stroke,  Signs/Symptoms:Shortness of Breath, Dyspnea,                  Chest Pain and Altered Mental Status; Risk Factors:Current                  Smoker.  Sonographer:     Ellouise Mose RDCS Referring Phys:  [id number] PETER E BABCOCK Diagnosing Phys: Ria Commander IMPRESSIONS  1. Left ventricular ejection fraction, by estimation, is 40%-45%. The left ventricle has moderately decreased function. The left ventricle demonstrates regional wall motion abnormalities (see scoring diagram/findings for description). The left ventricular internal cavity size was moderately dilated. Left ventricular diastolic parameters are consistent with Grade III diastolic dysfunction (restrictive).  2. Right ventricular systolic function is normal. The right ventricular size is normal.  3. Left atrial size was mild to moderately dilated.  4. The mitral valve is grossly normal. Trivial mitral valve regurgitation. No evidence of mitral stenosis. Moderate mitral annular calcification.  5. Tricuspid valve regurgitation is moderate.  6. The aortic valve is tricuspid. Aortic valve regurgitation is not visualized. No aortic stenosis is present.  7. The inferior vena cava is dilated in size with >50% respiratory variability, suggesting right atrial pressure of 8 mmHg.  FINDINGS  Left Ventricle: Left ventricular ejection fraction, by estimation, is 40 to 45%. The left ventricle has mildly decreased function. The left ventricle demonstrates regional wall motion abnormalities. The left ventricular internal cavity size was moderately dilated. There is no left ventricular hypertrophy. Left ventricular diastolic parameters are consistent with Grade III diastolic dysfunction (restrictive). Right Ventricle: The right ventricular size is normal. No increase in right ventricular wall thickness. Right ventricular systolic function is normal. Left Atrium: Left atrial size was mild to moderately dilated. Right Atrium: Right atrial size was normal in size. Pericardium: There  is no evidence of pericardial effusion. Mitral Valve: The mitral valve is grossly normal. Moderate mitral annular calcification. Trivial mitral valve regurgitation. No evidence of mitral valve stenosis. MV peak gradient, 15.8 mmHg. The mean mitral valve gradient is 6.5 mmHg. Tricuspid Valve: The tricuspid valve is normal in structure. Tricuspid valve regurgitation is moderate . No evidence of tricuspid stenosis. Aortic Valve: The aortic valve is tricuspid. Aortic valve regurgitation is not visualized. No aortic stenosis is present. Pulmonic Valve: The pulmonic valve was normal in structure. Pulmonic valve regurgitation is not visualized. No evidence of pulmonic stenosis. Aorta: The aortic root and ascending aorta are structurally normal, with no evidence of dilitation. Venous: The inferior vena cava is dilated in size with greater than 50% respiratory variability, suggesting right atrial pressure of 8 mmHg. IAS/Shunts: No atrial level shunt detected by color flow Doppler.  LEFT VENTRICLE PLAX 2D LVIDd:         3.65 cm LVIDs:         2.70 cm LV PW:         1.83 cm LV IVS:        0.90 cm LVOT diam:     2.30 cm     3D Volume EF: LV SV:         81          3D EF:        40 % LV SV Index:   49          LV EDV:       118 ml LVOT Area:     4.15 cm    LV ESV:       71 ml                            LV SV:        47 ml  LV Volumes (MOD) LV vol d, MOD A2C: 75.1 ml LV vol d, MOD A4C: 76.0 ml LV vol s, MOD A2C: 55.9 ml LV vol s, MOD A4C: 46.0 ml LV SV MOD A2C:     19.2 ml LV SV MOD A4C:     76.0 ml LV SV MOD BP:      24.3 ml RIGHT VENTRICLE             IVC RV S prime:     10.30 cm/s  IVC diam: 2.10 cm TAPSE (M-mode): 2.0 cm LEFT ATRIUM             Index        RIGHT ATRIUM          Index LA diam:        2.10 cm 1.27 cm/m   RA Area:     9.66 cm LA Vol (A2C):   46.5 ml 28.09 ml/m  RA Volume:   18.40 ml 11.12 ml/m LA Vol (A4C):  36.4 ml 21.99 ml/m LA Biplane Vol: 43.7 ml 26.40 ml/m  AORTIC VALVE LVOT Vmax:   112.00 cm/s  LVOT Vmean:  75.733 cm/s LVOT VTI:    0.196 m  AORTA Ao Root diam: 3.00 cm Ao Asc diam:  3.20 cm MITRAL VALVE                TRICUSPID VALVE MV Area (PHT): 7.99 cm     TR Peak grad:   43.0 mmHg MV Area VTI:   3.54 cm     TR Vmax:        328.00 cm/s MV Peak grad:  15.8 mmHg MV Mean grad:  6.5 mmHg     SHUNTS MV Vmax:       1.99 m/s     Systemic VTI:  0.20 m MV Vmean:      114.5 cm/s   Systemic Diam: 2.30 cm MV Decel Time: 95 msec MV E velocity: 192.00 cm/s Aditya Sabharwal Electronically signed by Ria Commander Signature Date/Time: 06/14/2023/9:37:53 AM    Final (Updated)    CT ABDOMEN PELVIS W CONTRAST Result Date: 06/13/2023 CLINICAL DATA:  Acute onset shortness of breath and rectal pain and blood red blood per rectum EXAM: CT ABDOMEN AND PELVIS WITH CONTRAST TECHNIQUE: Multidetector CT imaging of the abdomen and pelvis was performed using the standard protocol following bolus administration of intravenous contrast. RADIATION DOSE REDUCTION: This exam was performed according to the departmental dose-optimization program which includes automated exposure control, adjustment of the mA and/or kV according to patient size and/or use of iterative reconstruction technique. CONTRAST:  75mL OMNIPAQUE  IOHEXOL  350 MG/ML SOLN COMPARISON:  CT abdomen and pelvis dated 01/28/2023, earlier same day CTA chest FINDINGS: Lower chest: Diffuse interlobular septal thickening. Scattered subpleural ground-glass densities. Increased basilar right lower lobe consolidation. Similar minimal left basilar atelectasis/consolidation. Trace left pleural effusion. Partially imaged heart size is normal. Coronary calcifications. Hepatobiliary: No focal hepatic lesions. No intra or extrahepatic biliary ductal dilation. Normal gallbladder. Pancreas: Unchanged hypodensity in the pancreatic uncinate process measuring up to 1.1 cm (3:36) when measured similarly. No specific follow-up imaging recommended. Spleen: Normal in size without focal  abnormality. Adrenals/Urinary Tract: No adrenal nodules. No suspicious renal mass, calculi or hydronephrosis. Excreted contrast material within the urinary bladder. Stomach/Bowel: Small hiatal hernia. Normal appearance of the stomach. Increased mural thickening and mucosal hyperenhancement of the prolapsed rectum. No abnormal bowel dilation. Colonic diverticulosis without acute diverticulitis. Appendix is not discretely seen. Vascular/Lymphatic: Aortic atherosclerosis. Celiac artery stent is patent. 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 The narrow caliber of the stent precludes evaluation of the SMA origin. No enlarged abdominal or pelvic lymph nodes. Reproductive: No adnexal masses. Other: No free fluid, fluid collection, or free air. Musculoskeletal: No acute or abnormal lytic or blastic osseous lesions. Multilevel degenerative changes of the partially imaged thoracic and lumbar spine. Increased perianal soft tissue stranding. Small fat-containing paraumbilical hernia. IMPRESSION: 1. Increased mural thickening and mucosal hyperenhancement of the prolapsed rectum with increased perianal soft tissue stranding, consistent with proctitis. 2. Increased basilar right lower lobe consolidation, suspicious for atelectasis. 3. Diffuse interlobular septal thickening and scattered subpleural ground-glass densities, which may represent pulmonary edema. 4. Trace left pleural effusion. 5. Celiac artery stent is patent. 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 6. Aortic Atherosclerosis (ICD10-I70.0). Coronary artery calcifications. Assessment for potential risk factor modification, dietary therapy or pharmacologic therapy may be warranted, if clinically indicated. Electronically Signed   By: Limin  Xu M.D.   On: 06/13/2023 13:36        Scheduled Meds:  sodium chloride    Intravenous Once   atorvastatin   80 mg Oral Daily   DULoxetine   60 mg Oral Daily   fluticasone   furoate-vilanterol  1 puff Inhalation Daily   insulin  aspart  0-9 Units Subcutaneous TID WC   ipratropium  0.5 mg Nebulization BID   levothyroxine   75 mcg Oral QAC breakfast   memantine   10 mg Oral BID   pantoprazole  (PROTONIX ) IV  40 mg Intravenous Q12H   polyethylene glycol  17 g Oral Daily   potassium chloride   40 mEq Oral Q3H   pramipexole   0.25 mg Oral BID   sucralfate   1 g Oral TID WC & HS   Continuous Infusions:  azithromycin  500 mg (06/14/23 2133)     LOS: 2 days    Time spent: 52 minutes spent on chart review, discussion with nursing staff, consultants, updating family and interview/physical exam; more than 50% of that time was spent in counseling and/or coordination of care.    Camellia PARAS Aalia Greulich, DO [facility] Available via Epic secure chat 7am-7pm After these hours, please refer to coverage provider listed on [email or web address] 06/15/2023, 12:02 PM

## 2023-06-15 NOTE — Progress Notes (Addendum)
 Patient Name: Amy Mckay Date of Encounter: 06/15/2023 [facility] Cardiologist: None   Interval Summary  .    Patient resting in bed. She appears generally uncomfortable, main complains continue to be her abdominal and chronic back pain. She denies any chest pain, palpitations, shortness of breath, nausea, headaches or dizziness. She remains on 4L [place], which she reports as her baseline oxygen requirement. SpO2 has been 96-100%. She has been able to urinate, so RN sent off UA as patient was complaining of dysuria. Urine sample was taken from canister as patient is on purewick.   Vital Signs .    Vitals:   06/14/23 2012 06/15/23 0011 06/15/23 0357 06/15/23 0503  BP: (!) 131/92 95/83 107/86   Pulse: (!) 43 (!) 112 (!) 112   Resp: 19 16 18    Temp: 99.4 F (37.4 C) 98.4 F (36.9 C) 98.4 F (36.9 C)   TempSrc: Axillary Oral Oral   SpO2: 100% 99% 100%   Weight:    60.6 kg  Height:        Intake/Output Summary (Last 24 hours) at 06/15/2023 0901 Last data filed at 06/15/2023 0155 Gross per 24 hour  Intake --  Output 3500 ml  Net -3500 ml      06/15/2023    5:03 AM 06/14/2023    4:41 AM 06/13/2023   10:05 PM  Last 3 Weights  Weight (lbs) 133 lb 9.6 oz 145 lb 8.1 oz 145 lb 8.1 oz  Weight (kg) 60.6 kg 66 kg 66 kg      Telemetry/ECG    Sinus tachycardia with reported historical episodes of irregular rhythm - Personally Reviewed  Physical Exam .   GEN: No acute distress.   Neck: No JVD Cardiac: regular rhythm, tachycardic, no murmurs  Respiratory: clear to auscultation bilaterally, patient on 4L [place], no wheezing GI: Soft, nontender, non-distended  MS: No edema  Assessment & Plan .     NSTEMI, likely type II demand ischemia in the setting of ABLA  Elevated troponins 130>166>242>229 Suspected to be secondary due to demand ischemia caused by acute blood loss  Denies any chest pain Patient is not candidate for cardiac catheterization  Echo (12/30)  showed LVEF 40-45%, LV mildly decreased function, with regional wall motion normalities, grade 3 diastolic dysfunction, LA mild/moderately dilated, IVC dilated  -- Due to regional wall abnormalities on most recent echo will discuss with Dr. Sheena if patient would be candidate for cardiac catheterization after recovering from acute issues, if cardiac cath aligns with the patient's goals of care   Acute systolic congestive heart failure exacerbation  Previous echo (10/2022) showed LVEF 60-65% with no regional wall abnormalities, LV diastolic parameters consistent with Grade I diastolic dysfunction, normal RV function and size, IVC normal in size suggesting right atrial pressure of 3 mmHg Echo (06/14/23) showed LVEF 40-45%, LV mildly decreased function, with regional wall motion normalities, grade 3 diastolic dysfunction, LA mild/moderately dilated, IVC dilated  Patient weight down to 133 lb from 145.5 lb (12/30) Patient denies any shortness of breath, has no LE edema or JVD on exam For GDMT patient would likely be unable to tolerate BB, ARNI, or spiro due to episodes of hypotension and lower BP readings  -- Could consider starting SGLT2i at discharge as medical management   Electrolyte Imbalance  Hypokalemia, 3.3 Hypochloremia, 93  -- Continue scheduled potassium chloride  SA 40 mEq BID today per primary    Acute blood loss anemia  History of mesenteric ischemia s/p  SMA stent Abdominal pain Post 2 units PRBC, hemoglobin up to 9.9 (12/31)  Followed by GI -- Pain management per primary team   Acute on chronic respiratory failure History of CVA -- Treatment per primary team  For questions or updates, please contact [facility] Please consult [email or web address] for contact info under      Signed, Waddell DELENA Donath, PA-C   Patient seen and examined, note reviewed with the signed Advanced Practice Provider. I personally reviewed laboratory data, imaging studies and relevant notes. I  independently examined the patient and formulated the important aspects of the plan. I have personally discussed the plan with the patient and/or family. Comments or changes to the note/plan are indicated below.  Echo showed depressed EF with wall motion abnormalities.  Despite that she is currently not a candidate for cardiac catheterization and especially in the setting of acute bleeding will not be a candidate for antiplatelets for now. In terms of guideline directed medical therapy her blood pressure is marginal and currently the will not be able to tolerate any blood pressure affecting medication. Hemoglobin appears to be stable but still low. GI is considering EGD, no further discussion about the flexible sigmoidoscopy. She is not experiencing any angina symptoms if EGD is needed from a cardiovascular standpoint is important for us  to get this done for the patient giving her recent acute blood loss.  There have been no need for any ischemic evaluation prior to this.   Shiori Adcox DO, MS [facility] Attending Cardiologist [facility]  [address] [place], [place] [address] [phone] Website: [email or web address]

## 2023-06-15 NOTE — Evaluation (Signed)
 Occupational Therapy Evaluation Patient Details Name: Amy Mckay MRN: [id number] DOB: 03-04-1935 Today's Date: 06/15/2023   History of Present Illness Pt is an 88 y/o female admitted for acute on chronic respiratory failure with hypoxia, CHF exacerbation, anemia in setting of chronic prolapsed rectum and hypotension. PMH: COPD, CHF, mesenteric ischemia s/o SMA stenting, hyponatremia, CGA, chronic rectal prolapse, hypothyroidism, chronic dysphagia, chronic pain syndrome.   Clinical Impression   PTA, pt admitted from [facility] where she has been since discharge in May 2024. Pt reports using wheelchair primarily for mobility, able to transfer self and manage most ADLs from a wheelchair level. Prior to May 2024, pt was ambulating household distances and had PCA assist for ADLs. Pt presents now w/ deficits in strength, endurance, dynamic standing balance, cognition and chronic pain. Overall,pt requires no more than Min A for bed mobility and basic transfers though opted to return to bed at end of session. Pt requires Setup for UB ADL and up to Mod A for LB ADLs today. Patient will benefit from continued inpatient follow up therapy, <3 hours/day at DC.      If plan is discharge home, recommend the following: A little help with walking and/or transfers;A little help with bathing/dressing/bathroom    Functional Status Assessment  Patient has had a recent decline in their functional status and demonstrates the ability to make significant improvements in function in a reasonable and predictable amount of time.  Equipment Recommendations  Other (comment) (TBD pending progress)    Recommendations for Other Services       Precautions / Restrictions Precautions Precautions: Fall Restrictions Weight Bearing Restrictions Per Provider Order: No      Mobility Bed Mobility Overal bed mobility: Needs Assistance Bed Mobility: Supine to Sit, Sit to Supine, Rolling Rolling: Modified independent  (Device/Increase time)   Supine to sit: Min assist Sit to supine: Supervision   General bed mobility comments: light assist to scoot bottom to EOB, lift trunk. able to return to supine without assist. able to roll side to side for linen change and pull self up with headboard without assist    Transfers Overall transfer level: Needs assistance Equipment used: None Transfers: Sit to/from Stand Sit to Stand: Contact guard assist           General transfer comment: CGA to stand at bedside briefly to step/scoot higher in bed. pt declined transfer to chair despite encouragement      Balance Overall balance assessment: Needs assistance Sitting-balance support: No upper extremity supported, Feet supported Sitting balance-Leahy Scale: Good     Standing balance support: No upper extremity supported, Single extremity supported, During functional activity Standing balance-Leahy Scale: Fair                             ADL either performed or assessed with clinical judgement   ADL Overall ADL's : Needs assistance/impaired Eating/Feeding: Independent;Bed level Eating/Feeding Details (indicate cue type and reason): stting up in bed, able to open containers Grooming: Set up;Sitting;Applying deodorant Grooming Details (indicate cue type and reason): sitting EOB Upper Body Bathing: Set up;Sitting Upper Body Bathing Details (indicate cue type and reason): Able to bathe chest and underarms sitting EOB Lower Body Bathing: Moderate assistance;Sitting/lateral leans;Sit to/from stand Lower Body Bathing Details (indicate cue type and reason): able to bathe peri region seated and upper LE. assist for bathing bottom after bowel incontinence Upper Body Dressing : Set up;Sitting   Lower Body Dressing: Moderate  assistance;Sitting/lateral leans;Sit to/from stand   Toilet Transfer: Minimal assistance;Stand-pivot;BSC/3in1;Rolling walker (2 wheels)   Toileting- Clothing Manipulation and  Hygiene: Moderate assistance;Sitting/lateral lean;Sit to/from stand Toileting - Clothing Manipulation Details (indicate cue type and reason): assist for cleanup after bowel incontinence             Vision Baseline Vision/History: 1 Wears glasses Ability to See in Adequate Light: 0 Adequate Patient Visual Report: No change from baseline Vision Assessment?: No apparent visual deficits     Perception         Praxis         Pertinent Vitals/Pain Pain Assessment Pain Assessment: Faces Faces Pain Scale: Hurts little more Pain Location: bottom, R ear, everything Pain Descriptors / Indicators: Grimacing, Guarding Pain Intervention(s): Monitored during session, Patient requesting pain meds-RN notified     Extremity/Trunk Assessment Upper Extremity Assessment Upper Extremity Assessment: Generalized weakness;Right hand dominant   Lower Extremity Assessment Lower Extremity Assessment: Defer to PT evaluation   Cervical / Trunk Assessment Cervical / Trunk Assessment: Normal   Communication Communication Communication: Hearing impairment Cueing Techniques: Verbal cues;Gestural cues   Cognition Arousal: Alert Behavior During Therapy: WFL for tasks assessed/performed, Restless Overall Cognitive Status: No family/caregiver present to determine baseline cognitive functioning                                 General Comments: pleasant, aware of situation though some variations in reported pain, other symptoms and plan for continued rehab     General Comments       Exercises     Shoulder Instructions      Home Living Family/patient expects to be discharged to:: Private residence Living Arrangements: Children (daughter) Available Help at Discharge: Personal care attendant;Family;Available PRN/intermittently Type of Home: House Home Access: Stairs to enter [employer] of Steps: 2 back, 8 front Entrance Stairs-Rails: Right Home Layout: Able to live  on main level with bedroom/bathroom     Bathroom Shower/Tub: Chief Strategy Officer: Standard Bathroom Accessibility: Yes   Home Equipment: Rollator (4 wheels)   Additional Comments: home setup from prior admission. pt has been at [facility] since May 2024      Prior Functioning/Environment Prior Level of Function : Needs assist             Mobility Comments: reports use of RW and short distance mobility in the home prior to May 2024. Pt reports able to transfer to w/c and use w/c to get to/from bathroom without assist at [facility] ADLs Comments: had aide prior to admission in May 2024 to provide light assist for ADLs. baseline bowel incontinence w/ prolapsed rectum hx        OT Problem List: Decreased strength;Decreased activity tolerance;Impaired balance (sitting and/or standing);Decreased cognition;Decreased knowledge of use of DME or AE;Pain      OT Treatment/Interventions: Self-care/ADL training;Therapeutic exercise;DME and/or AE instruction;Energy conservation;Therapeutic activities;Patient/family education;Balance training    OT Goals(Current goals can be found in the care plan section) Acute Rehab OT Goals Patient Stated Goal: finish rehab, find new place to live with daughter, decrease pain OT Goal Formulation: With patient Time For Goal Achievement: 06/29/23 Potential to Achieve Goals: Good ADL Goals Pt Will Perform Lower Body Bathing: with modified independence;sitting/lateral leans;sit to/from stand Pt Will Perform Lower Body Dressing: with modified independence;sitting/lateral leans;sit to/from stand Pt Will Transfer to Toilet: with contact guard assist;ambulating Pt/caregiver will Perform Home Exercise Program: Increased strength;Both right  and left upper extremity;With theraband;Independently;With written HEP provided  OT Frequency: Min 1X/week    Co-evaluation              AM-PAC OT 6 Clicks Daily Activity     Outcome Measure Help from  another person eating meals?: None Help from another person taking care of personal grooming?: A Little Help from another person toileting, which includes using toliet, bedpan, or urinal?: A Lot Help from another person bathing (including washing, rinsing, drying)?: A Lot Help from another person to put on and taking off regular upper body clothing?: A Little Help from another person to put on and taking off regular lower body clothing?: A Lot 6 Click Score: 16   End of Session Equipment Utilized During Treatment: Oxygen Nurse Communication: Mobility status;Patient requests pain meds  Activity Tolerance: Patient tolerated treatment well;Patient limited by fatigue Patient left: in bed;with call bell/phone within reach;with bed alarm set  OT Visit Diagnosis: Unsteadiness on feet (R26.81);Other abnormalities of gait and mobility (R26.89);Muscle weakness (generalized) (M62.81)                Time: 9269-9196 OT Time Calculation (min): 33 min Charges:  OT General Charges $OT Visit: 1 Visit OT Evaluation $OT Eval Low Complexity: 1 Low OT Treatments $Self Care/Home Management : 8-22 mins  Mliss NOVAK, OTR/L Acute Rehab Services Office: [phone]   Mliss Getting 06/15/2023, 8:20 AM

## 2023-06-15 NOTE — Plan of Care (Signed)
   Problem: Education: Goal: Knowledge of General Education information will improve Description: Including pain rating scale, medication(s)/side effects and non-pharmacologic comfort measures Outcome: Progressing   Problem: Clinical Measurements: Goal: Will remain free from infection Outcome: Progressing   Problem: Clinical Measurements: Goal: Respiratory complications will improve Outcome: Progressing

## 2023-06-15 NOTE — Evaluation (Signed)
 Physical Therapy Evaluation Patient Details Name: Amy Mckay MRN: [id number] DOB: 21-Aug-1934 Today's Date: 06/15/2023  History of Present Illness  Pt is an 88 y/o female admitted for acute on chronic respiratory failure with hypoxia, CHF exacerbation, anemia in setting of chronic prolapsed rectum and hypotension. PMH: COPD, CHF, mesenteric ischemia s/o SMA stenting, hyponatremia, CGA, chronic rectal prolapse, hypothyroidism, chronic dysphagia, chronic pain syndrome.   Clinical Impression  Amy Mckay is 88 y.o. female admitted with above HPI and diagnosis. Patient is currently limited by functional impairments below (see PT problem list). Patient reports at SNF she has been Mod Ind for stand pivot transfers with no AD to transfer into WC. Currently she requires min assist for bed mobility and for stand step transfers with HHA. Pt had successful BM and rectum noted to be ~5-6 prolapsed in sitting on BSC, reduced to ~2-3 on return to bed. Patient will benefit from continued skilled PT interventions to address impairments and progress independence with mobility. Patient will benefit from continued inpatient follow up therapy, <3 hours/day. Acute PT will follow and progress as able.         If plan is discharge home, recommend the following: A little help with walking and/or transfers;A little help with bathing/dressing/bathroom;Assistance with cooking/housework;Direct supervision/assist for medications management;Assist for transportation;Help with stairs or ramp for entrance   Can travel by private vehicle   No    Equipment Recommendations Rolling walker (2 wheels)  Recommendations for Other Services       Functional Status Assessment Patient has had a recent decline in their functional status and demonstrates the ability to make significant improvements in function in a reasonable and predictable amount of time.     Precautions / Restrictions Precautions Precautions:  Fall Restrictions Weight Bearing Restrictions Per Provider Order: No      Mobility  Bed Mobility Overal bed mobility: Needs Assistance Bed Mobility: Supine to Sit, Rolling, Sit to Sidelying Rolling: Modified independent (Device/Increase time)   Supine to sit: Min assist   Sit to sidelying: Min assist General bed mobility comments: pt able to use bed features for rolling. min assist to raise trunk upright and bed pad used to scoot to EOB. min assist to raise LE's onto bed to sidelying.    Transfers Overall transfer level: Needs assistance Equipment used: None Transfers: Sit to/from Stand, Bed to chair/wheelchair/BSC Sit to Stand: Contact guard assist, Min assist   Step pivot transfers: Min assist       General transfer comment: CGA/min assist to rise from EOB. Min assist to guide trun/steps bed>BSC>bed.    Ambulation/Gait                  Stairs            Wheelchair Mobility     Tilt Bed    Modified Rankin (Stroke Patients Only)       Balance Overall balance assessment: Needs assistance Sitting-balance support: No upper extremity supported, Feet supported Sitting balance-Leahy Scale: Good     Standing balance support: No upper extremity supported, Single extremity supported, During functional activity Standing balance-Leahy Scale: Fair                               Pertinent Vitals/Pain Pain Assessment Pain Assessment: Faces Faces Pain Scale: Hurts little more Pain Location: bottom, R ear, everything Pain Descriptors / Indicators: Grimacing, Guarding Pain Intervention(s): Monitored during session, Limited activity within patient's  tolerance, Repositioned    Home Living Family/patient expects to be discharged to:: Private residence Living Arrangements: Children (daughter) Available Help at Discharge: Personal care attendant;Family;Available PRN/intermittently Type of Home: House Home Access: Stairs to enter Entrance  Stairs-Rails: Right Entrance Stairs-Number of Steps: 2 back, 8 front   Home Layout: Able to live on main level with bedroom/bathroom Home Equipment: Rollator (4 wheels) Additional Comments: home setup from prior admission. pt has been at [facility] since May 2024    Prior Function Prior Level of Function : Needs assist             Mobility Comments: reports use of RW and short distance mobility in the home prior to May 2024. Pt reports able to transfer to w/c and use w/c to get to/from bathroom without assist at [facility] ADLs Comments: had aide prior to admission in May 2024 to provide light assist for ADLs. baseline bowel incontinence w/ prolapsed rectum hx     Extremity/Trunk Assessment   Upper Extremity Assessment Upper Extremity Assessment: Defer to OT evaluation    Lower Extremity Assessment Lower Extremity Assessment: Generalized weakness    Cervical / Trunk Assessment Cervical / Trunk Assessment: Normal  Communication   Communication Communication: Hearing impairment  Cognition Arousal: Alert Behavior During Therapy: WFL for tasks assessed/performed, Restless Overall Cognitive Status: No family/caregiver present to determine baseline cognitive functioning                                 General Comments: pleasant, aware of situation though some variations in reported pain, other symptoms and plan for continued rehab        General Comments      Exercises     Assessment/Plan    PT Assessment Patient needs continued PT services  PT Problem List Decreased strength;Decreased range of motion;Decreased activity tolerance;Decreased balance;Decreased mobility;Decreased knowledge of use of DME;Decreased safety awareness;Decreased knowledge of precautions       PT Treatment Interventions DME instruction;Gait training;Stair training;Functional mobility training;Therapeutic activities;Therapeutic exercise;Balance training;Patient/family education    PT  Goals (Current goals can be found in the Care Plan section)  Acute Rehab PT Goals PT Goal Formulation: With patient Time For Goal Achievement: 06/29/23 Potential to Achieve Goals: Fair    Frequency Min 1X/week     Co-evaluation               AM-PAC PT 6 Clicks Mobility  Outcome Measure Help needed turning from your back to your side while in a flat bed without using bedrails?: A Little Help needed moving from lying on your back to sitting on the side of a flat bed without using bedrails?: A Little Help needed moving to and from a bed to a chair (including a wheelchair)?: A Little Help needed standing up from a chair using your arms (e.g., wheelchair or bedside chair)?: A Little Help needed to walk in hospital room?: Total Help needed climbing 3-5 steps with a railing? : Total 6 Click Score: 14    End of Session Equipment Utilized During Treatment: Gait belt;Oxygen Activity Tolerance: Patient tolerated treatment well Patient left: in bed;with call bell/phone within reach;with bed alarm set Nurse Communication: Mobility status PT Visit Diagnosis: Muscle weakness (generalized) (M62.81);Difficulty in walking, not elsewhere classified (R26.2);Other abnormalities of gait and mobility (R26.89);Other symptoms and signs involving the nervous system (R29.898)    Time: 8982-8952 PT Time Calculation (min) (ACUTE ONLY): 30 min   Charges:  PT Evaluation $PT Eval Moderate Complexity: 1 Mod PT Treatments $Therapeutic Activity: 8-22 mins PT General Charges $$ ACUTE PT VISIT: 1 Visit         Vernell DONEEN KLEIN, DPT Acute Rehabilitation Services Office [phone]  06/15/23 1:52 PM

## 2023-06-16 DIAGNOSIS — R1084 Generalized abdominal pain: Secondary | ICD-10-CM

## 2023-06-16 DIAGNOSIS — K551 Chronic vascular disorders of intestine: Secondary | ICD-10-CM

## 2023-06-16 DIAGNOSIS — T82858A Stenosis of vascular prosthetic devices, implants and grafts, initial encounter: Secondary | ICD-10-CM

## 2023-06-16 DIAGNOSIS — I214 Non-ST elevation (NSTEMI) myocardial infarction: Secondary | ICD-10-CM | POA: Diagnosis not present

## 2023-06-16 LAB — BASIC METABOLIC PANEL
Anion gap: 12 (ref 5–15)
BUN: 21 mg/dL (ref 8–23)
CO2: 27 mmol/L (ref 22–32)
Calcium: 9 mg/dL (ref 8.9–10.3)
Chloride: 99 mmol/L (ref 98–111)
Creatinine, Ser: 0.84 mg/dL (ref 0.44–1.00)
GFR, Estimated: >60 mL/min (ref >60)
Glucose, Bld: 93 mg/dL (ref 70–99)
Potassium: 3.8 mmol/L (ref 3.5–5.1)
Sodium: 138 mmol/L (ref 135–145)

## 2023-06-16 LAB — CBC
HCT: 32.7 % — ABNORMAL LOW (ref 36.0–46.0)
Hemoglobin: 10.2 g/dL — ABNORMAL LOW (ref 12.0–15.0)
MCH: 27 pg (ref 26.0–34.0)
MCHC: 31.2 g/dL (ref 30.0–36.0)
MCV: 86.5 fL (ref 80.0–100.0)
Platelets: 438 10*3/uL — ABNORMAL HIGH (ref 150–400)
RBC: 3.78 MIL/uL — ABNORMAL LOW (ref 3.87–5.11)
RDW: 18 % — ABNORMAL HIGH (ref 11.5–15.5)
WBC: 13.2 10*3/uL — ABNORMAL HIGH (ref 4.0–10.5)
nRBC: 0.3 % — ABNORMAL HIGH (ref 0.0–0.2)

## 2023-06-16 LAB — GLUCOSE, CAPILLARY
Glucose-Capillary: 141 mg/dL — ABNORMAL HIGH (ref 70–99)
Glucose-Capillary: 113 mg/dL — ABNORMAL HIGH (ref 70–99)
Glucose-Capillary: 101 mg/dL — ABNORMAL HIGH (ref 70–99)
Glucose-Capillary: 76 mg/dL (ref 70–99)

## 2023-06-16 LAB — MAGNESIUM: Magnesium: 2.1 mg/dL (ref 1.7–2.4)

## 2023-06-16 MED ORDER — ASPIRIN 81 MG PO TBEC
81.0000 mg | DELAYED_RELEASE_TABLET | Freq: Every day | ORAL | Status: DC
  Administered 2023-06-16 – 2023-06-17 (×2): 81 mg via ORAL
  Filled 2023-06-16 (×3): qty 1

## 2023-06-16 MED ORDER — METOPROLOL SUCCINATE ER 25 MG PO TB24
12.5000 mg | ORAL_TABLET | Freq: Every day | ORAL | Status: DC
Start: 2023-06-16 — End: 2023-06-17
  Administered 2023-06-16 – 2023-06-17 (×2): 12.5 mg via ORAL
  Filled 2023-06-16 (×3): qty 1

## 2023-06-16 MED ORDER — PANTOPRAZOLE SODIUM 40 MG PO TBEC
40.0000 mg | DELAYED_RELEASE_TABLET | Freq: Two times a day (BID) | ORAL | Status: DC
  Administered 2023-06-16 – 2023-06-17 (×2): 40 mg via ORAL
  Filled 2023-06-16 (×2): qty 1

## 2023-06-16 MED ORDER — TICAGRELOR 90 MG PO TABS
90.0000 mg | ORAL_TABLET | Freq: Two times a day (BID) | ORAL | Status: DC
  Administered 2023-06-16 – 2023-06-17 (×2): 90 mg via ORAL
  Filled 2023-06-16 (×2): qty 1

## 2023-06-16 MED ORDER — SACUBITRIL-VALSARTAN 24-26 MG PO TABS
1.0000 | ORAL_TABLET | Freq: Two times a day (BID) | ORAL | Status: DC
  Administered 2023-06-16 – 2023-06-17 (×2): 1 via ORAL
  Filled 2023-06-16 (×2): qty 1

## 2023-06-16 MED ORDER — HYDROCORTISONE (PERIANAL) 2.5 % EX CREA
TOPICAL_CREAM | Freq: Four times a day (QID) | CUTANEOUS | Status: DC | PRN
  Administered 2023-06-16: 1 via RECTAL
  Filled 2023-06-16: qty 28.35

## 2023-06-16 NOTE — Progress Notes (Signed)
 [facility] Progress Note  SUBJECTIVE:   Interval history: Amy Mckay was seen and evaluated today at bedside. Resting comfortably in bed, no family at bedside. Noted that her abdominal pain is very mild today. Lower abdominal pain with some radiation to her back with palpation. Had some left chest wall discomfort this AM. No shortness of breath. Passing flatus, no bowel movement.   Past Medical History:  Diagnosis Date   Acid reflux    COPD (chronic obstructive pulmonary disease) (HCC)    Hypertension    Past Surgical History:  Procedure Laterality Date   BIOPSY  10/17/2021   Procedure: BIOPSY;  Surgeon: Dianna Specking, MD;  Location: [facility];  Service: Gastroenterology;;   BIOPSY  01/29/2023   Procedure: BIOPSY;  Surgeon: Dianna Specking, MD;  Location: [facility];  Service: Gastroenterology;;   COLONOSCOPY N/A 10/17/2021   Procedure: COLONOSCOPY;  Surgeon: Dianna Specking, MD;  Location: [facility];  Service: Gastroenterology;  Laterality: N/A;   COLONOSCOPY WITH PROPOFOL  N/A 10/18/2021   Procedure: COLONOSCOPY WITH PROPOFOL ;  Surgeon: Elicia Claw, MD;  Location: [facility];  Service: Gastroenterology;  Laterality: N/A;   ESOPHAGOGASTRODUODENOSCOPY N/A 10/17/2021   Procedure: ESOPHAGOGASTRODUODENOSCOPY (EGD);  Surgeon: Dianna Specking, MD;  Location: [facility];  Service: Gastroenterology;  Laterality: N/A;   FLEXIBLE SIGMOIDOSCOPY N/A 01/29/2023   Procedure: FLEXIBLE SIGMOIDOSCOPY;  Surgeon: Dianna Specking, MD;  Location: [facility];  Service: Gastroenterology;  Laterality: N/A;   POLYPECTOMY  10/18/2021   Procedure: POLYPECTOMY;  Surgeon: Elicia Claw, MD;  Location: [facility];  Service: Gastroenterology;;   Current Facility-Administered Medications  Medication Dose Route Frequency Provider Last Rate Last Admin   0.9 %  sodium chloride  infusion (Manually program via Guardrails IV Fluids)   Intravenous Once Babcock, Peter E, NP        atorvastatin  (LIPITOR ) tablet 80 mg  80 mg Oral Daily Babcock, Peter E, NP   80 mg at 06/16/23 9081   docusate sodium  (COLACE) capsule 100 mg  100 mg Oral BID PRN Babcock, Peter E, NP       DULoxetine  (CYMBALTA ) DR capsule 60 mg  60 mg Oral Daily Austria, Eric J, DO   60 mg at 06/15/23 1658   fluticasone  furoate-vilanterol (BREO ELLIPTA ) 200-25 MCG/ACT 1 puff  1 puff Inhalation Daily Babcock, Peter E, NP   1 puff at 06/16/23 0757   HYDROmorphone  (DILAUDID ) injection 0.5 mg  0.5 mg Intravenous Q3H PRN Elmaghraby, Zaki, MD   0.5 mg at 06/14/23 1826   insulin  aspart (novoLOG ) injection 0-9 Units  0-9 Units Subcutaneous TID WC Austria, Eric J, DO   1 Units at 06/16/23 9364   ipratropium (ATROVENT ) nebulizer solution 0.5 mg  0.5 mg Nebulization BID Austria, Eric J, DO   0.5 mg at 06/16/23 0757   levalbuterol  (XOPENEX ) nebulizer solution 0.63 mg  0.63 mg Nebulization Q6H PRN Babcock, Peter E, NP   0.63 mg at 06/13/23 2154   levothyroxine  (SYNTHROID ) tablet 75 mcg  75 mcg Oral QAC breakfast Babcock, Peter E, NP   75 mcg at 06/16/23 0915   memantine  (NAMENDA ) tablet 10 mg  10 mg Oral BID Austria, Eric J, DO   10 mg at 06/16/23 9081   metoprolol  succinate (TOPROL -XL) 24 hr tablet 12.5 mg  12.5 mg Oral Daily Tobb, Kardie, DO   12.5 mg at 06/16/23 0930   morphine  (PF) 2 MG/ML injection 1 mg  1 mg Intravenous Q3H PRN Austria, Eric J, DO   1 mg at 06/16/23 9090  ondansetron  (ZOFRAN ) injection 4 mg  4 mg Intravenous Q6H PRN Laveda Roosevelt, MD   4 mg at 06/14/23 2053   oxyCODONE  (Oxy IR/ROXICODONE ) immediate release tablet 5-10 mg  5-10 mg Oral Q4H PRN Austria, Eric J, DO       pantoprazole  (PROTONIX ) injection 40 mg  40 mg Intravenous Q12H Babcock, Peter E, NP   40 mg at 06/16/23 0904   polyethylene glycol (MIRALAX  / GLYCOLAX ) packet 17 g  17 g Oral Daily Kriss Estefana DEL, DO   17 g at 06/15/23 9085   pramipexole  (MIRAPEX ) tablet 0.25 mg  0.25 mg Oral BID Babcock, Peter E, NP   0.25 mg at 06/16/23 9079    sacubitril -valsartan  (ENTRESTO ) 24-26 mg per tablet  1 tablet Oral BID Tobb, Kardie, DO       sucralfate  (CARAFATE ) 1 GM/10ML suspension 1 g  1 g Oral TID WC & HS Kriss Estefana H, DO   1 g at 06/16/23 9388   Allergies as of 06/12/2023 - Review Complete 06/12/2023  Allergen Reaction Noted   Penicillins Anaphylaxis, Swelling, and Other (See Comments) 05/14/2016   Penicillin g Hives 11/03/2022   Review of Systems:  Review of Systems  Respiratory:  Negative for shortness of breath.   Cardiovascular:  Positive for chest pain.  Gastrointestinal:  Positive for abdominal pain.    OBJECTIVE:   Temp:  [97.8 F (36.6 C)-98.6 F (37 C)] 97.8 F (36.6 C) (01/01 0845) Pulse Rate:  [102-113] 105 (01/01 0900) Resp:  [16-20] 18 (01/01 0845) BP: (113-135)/(81-97) 113/81 (01/01 0845) SpO2:  [97 %-100 %] 99 % (01/01 0845) Weight:  [61.9 kg] 61.9 kg (01/01 0600) Last BM Date : 06/15/23 Physical Exam Constitutional:      General: She is not in acute distress.    Appearance: She is not ill-appearing, toxic-appearing or diaphoretic.  Cardiovascular:     Rate and Rhythm: Normal rate and regular rhythm.  Pulmonary:     Effort: No respiratory distress.     Breath sounds: Normal breath sounds.  Abdominal:     General: Bowel sounds are normal. There is no distension.     Palpations: Abdomen is soft.     Tenderness: There is no abdominal tenderness. There is no guarding.  Neurological:     Mental Status: She is alert.     Labs: Recent Labs    06/14/23 0753 06/15/23 0742 06/16/23 0308  WBC 17.9* 14.3* 13.2*  HGB 8.7* 9.9* 10.2*  HCT 26.7* 30.5* 32.7*  PLT 397 437* 438*   BMET Recent Labs    06/14/23 0753 06/15/23 0307 06/16/23 0308  NA 135 136 138  K 3.8 3.3* 3.8  CL 94* 93* 99  CO2 29 29 27   GLUCOSE 101* 98 93  BUN 17 21 21   CREATININE 0.83 0.85 0.84  CALCIUM  9.4 9.1 9.0   LFT No results for input(s): PROT, ALBUMIN, AST, ALT, ALKPHOS, BILITOT, BILIDIR, IBILI  in the last 72 hours. PT/INR No results for input(s): LABPROT, INR in the last 72 hours. Diagnostic imaging: CT Angio Abd/Pel w/ and/or w/o Result Date: 06/16/2023 CLINICAL DATA:  Chronic mesenteric ischemia EXAM: CTA ABDOMEN AND PELVIS WITHOUT AND WITH CONTRAST TECHNIQUE: Multidetector CT imaging of the abdomen and pelvis was performed using the standard protocol during bolus administration of intravenous contrast. Multiplanar reconstructed images and MIPs were obtained and reviewed to evaluate the vascular anatomy. RADIATION DOSE REDUCTION: This exam was performed according to the departmental dose-optimization program which includes automated exposure control, adjustment of  the mA and/or kV according to patient size and/or use of iterative reconstruction technique. CONTRAST:  75mL OMNIPAQUE  IOHEXOL  350 MG/ML SOLN COMPARISON:  06/13/2023 and previous FINDINGS: VASCULAR Aorta: Extensive partially calcified atheromatous plaque, with some eccentric mural thrombus in the suprarenal segment no aneurysm, dissection, or stenosis. Celiac: Atheromatous.  Patent ostial stent with only mild stenosis. SMA: Calcified ostial plaque post stenting. There is focal low-attenuation intraluminal in the proximal stent suggesting neointimal hyperplasia, of possible hemodynamic significance. Renals: Single right, with eccentric calcified ostial plaque extending over length of at least 2.2 cm, of possible hemodynamic significance. Single left, with calcified ostial plaque resulting in short segment stenosis of at least mild severity, patent distally. IMA: Origin stenosis related to aortic wall plaque, patent distally. Inflow: Moderate nonocclusive bilateral common iliac artery plaque. Mild bilateral external iliac artery plaque. No high-grade stenosis or aneurysm. Internal iliacs are atheromatous, nonaneurysmal. Proximal Outflow: Mild scattered nonocclusive calcified plaque. Veins: Patent hepatic veins, portal vein, SM V,  bilateral renal veins, iliac venous system and IVC. Review of the MIP images confirms the above findings. NON-VASCULAR Lower chest: No pleural or pericardial effusion. Dependent atelectasis/consolidation at both lung bases. 6 mm perifissural nodule, lateral basal segment right lower lobe, stable since 10/22/2016, probably lymph node. Linear scarring or atelectasis in the posterior lingula. Pulmonary emphysema. Partially calcified occlusive plaque at the left subclavian artery origin. Hepatobiliary: No focal liver abnormality is seen. No gallstones, gallbladder wall thickening, or biliary dilatation. Pancreas: Unremarkable. No pancreatic ductal dilatation or surrounding inflammatory changes. Spleen: Normal in size without focal abnormality. Adrenals/Urinary Tract: No adrenal mass. Lobular renal contours with symmetric enhancement. No evident urolithiasis or hydronephrosis. Urinary bladder partially distended. Stomach/Bowel: Stomach nondistended. Small bowel nondilated. Colon is partially distended, with scattered distal descending diverticula; no adjacent inflammatory change. Rectal prolapse. Lymphatic:   No enlarged abdominal or pelvic lymph nodes. Reproductive: Status post hysterectomy. No adnexal masses. Other: No ascites.  No free air. Musculoskeletal: Thoracolumbar levoscoliosis apex L1 with multilevel spondylitic change. IMPRESSION: 1. 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 2.  SMA stent re-stenosis. 3. Eccentric calcified ostial plaque right renal artery, of possible hemodynamic significance. 4. Descending colonic diverticulosis. 5. Rectal prolapse. 6. Aortic Atherosclerosis (ICD10-I70.0) and Emphysema (ICD10-J43.9). NON-VASCULAR Electronically Signed   By: JONETTA Faes M.D.   On: 06/16/2023 08:28   IMPRESSION: Epigastric abdominal pain             -CTAP with no signs of ischemia              -Somewhat improved with sucralfate  Abdominal atherosclerosis             -Last CT angiogram of  abdomen 01/29/21 showing patent ostial celiac stent  -CT angiogram of the abdomen 06/15/23 shows SMA stent re-stenosis NSTEMI, suspected from demand ischemia Rectal prolapse, chronic, stable in size   -Has followed with [facility] 04/14/23 Lactic acidosis, resolved  COPD, on supplemental oxygen  Hypertension   PLAN: -Abdominal pain appears to be improved with sucralfate  administration -CT angiogram testing showed SMA stent re-stenosis, recommend consult vascular surgery team to determine if this could be contributing to her abdominal pain  -No plan for EGD at this juncture -Will require outpatient follow up with [facility] to discuss surgical intervention on rectal prolapse (last note shows that patient would need both cardiac and pulmonary clearance prior to surgical intervention) -Diet as tolerated -Attempted to contact both patients daughter, [name], and her grand-daughter, Meghan, both no answer, did not  leave voicemail  -[facility] will follow   LOS: 3 days   Estefana Keas, [facility]

## 2023-06-16 NOTE — Progress Notes (Signed)
 PROGRESS NOTE    Amy Mckay  FMW:[id number] DOB: [birth year] DOA: 06/12/2023 PCP: Karlene Mungo, MD    Brief Narrative:   Amy Mckay is a 89 y.o. female with past medical history significant for COPD on 3L [place] at home, mesenteric ischemia s/p SMA stenting, chronic hyponatremia, CVA, chronic rectal prolapse, hypothyroidism, chronic dysphagia/esophageal dysmotility, chronic pain syndrome who presented to [facility] ED on 12/28 from [facility] with shortness of breath.  Patient also states having rectal pain and noted to have some blood when wiping over the past month On EMS arrival, patient was noted to be hypoxic with SpO2 80% on 4L . Patient was treated with epi pen, solumedrol, nebs, and IV mag. Placed on CPAP; and transported to the ED for further evaluation.    Patient last admitted on 01/2023 w/ proctitis.   In the ED, temperature 97.9 F, HR 124, RR 16, BP 111/85, SpO2 100% on BiPAP.  WBC 19.8, hemoglobin 6.3, platelet count 507.  Sodium 134, potassium 3.9, chloride 93, CO2 26, glucose 274, BUN 18, creatinine 0.92.  BNP 502.9.  High sensitivity troponin 130>166>242>229.  Chest x-ray with diffuse interstitial prominence with patchy bibasilar groundglass airspace disease reflective of pulmonary edema versus atypical infection.CT angiogram chest negative for pulm embolism, areas of interstitial/septal thickening lung bases/anterior lung suspicious for pulmonary edema, atypical infection minimal consolidation/atelectasis dependently in the lower lobes, ill-defined nodular opacities left upper lobe measuring up to 9 mm likely infectious or inflammatory, many of the right lung pulmonary nodules on prior exam unchanged, enlarged right hilar lymph node likely reactive, moderate emphysema.  Patient was given Levaquin .  While in the ED patient became hypotensive and transfuse 1 unit PRBCs.  PCCM consulted for ICU admission.  Significant Hospital events: 12/28: Admit to ICU, platelet  failure on BiPAP, hypotensive requiring Levophed  12/29: Weaned off of Levophed  gtt; Cardiology and [facility] consulted 12/30: Transferred to [facility] 12/31: Patient, complaining of abdominal discomfort, rectal prolapse, hemoglobin stable, GI ordered CTA A/P; cardiology with no plans for additional testing 1/1: CT angiogram notable for restenosis of SMA stent, GI recommends vascular surgery consultation, no plans for EGD at this point, hemoglobin stable   Assessment & Plan:   Acute on chronic respiratory failure with hypoxia Patient is laying in the ED with progressive shortness of breath, O2 dependent on 4 L at baseline but SpO2 80% on EMS arrival.  Patient is afebrile with elevated WBC count of 19.8.  COVID/influenza/RSV PCR negative.  Respiratory viral panel negative.  Imaging suggestive of pulmonary edema versus atypical infection.  Initially requiring BiPAP which has been weaned off. -- Continue supplemental oxygen maintain SpO2 greater than 88% -- Treatment as below  Acute systolic congestive heart failure exacerbation Patient presenting with progressive shortness of breath, chest x-ray with diffuse interstitial prominence concerning for pulmonary edema.  BNP elevated 502.9.  TTE with LVEF 40-45%, LV mildly decreased function, with regional wall motion normalities, grade 3 diastolic dysfunction, LA mild/moderately dilated, IVC dilated. -- Cardiology following, appreciate assistance -- Metoprolol  succinate 12.5 mg p.o. daily -- Entresto  24-26 mg p.o. 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 Lactic acid 1.9>2.1>1.1.  CT abdomen/pelvis with contrast celiac artery stent patent, similar moderate stenosis of proximal SMA, aortic atherosclerosis.  CT angiogram abdomen/pelvis with patent celiac artery stent,  SMA  stent restenosis. -- [facility] following, appreciate assistance -- Vascular surgery consulted -- Hgb 6.3>7.8>7.2>>7.5>8.7>8.7>9.9>10.2 -- transfused 2u pRBC on 12/29 -- Holding home aspirin  and Brilinta  -- Atorvastatin  80 mg p.o. daily -- GI deferring EGD at this point -- CBC daily  Atypical viral infection/pneumonia COVID/RSV/influenza A/B PCR negative.  Respiratory viral panel negative.  Strep pneumo urine antigen negative.  Completed course of azithromycin . -- WBC 19.8>18.1>17.9>14.3> 13.2 -- CBC daily  NSTEMI, likely type II demand ischemia in the setting of ABLA -- Cardiology following, appreciate assistance -- Troponin 130>166>242>229 -- TTE LVEF 40-45%, LV mildly decreased function, + LV RWMA, grade 3 diastolic dysfunction, LA mild/moderately dilated, IVC dilated. -- Continue monitoring on telemetry  Rectal prolapse, chronic -- Colace 100 mg p.o. twice daily -- MiraLAX  daily -- Supportive care -- Unlikely to tolerate surgical procedure for management given her comorbidities and advanced age, -- GI recommends continued outpatient follow-up with [facility]  Hypovolemic shock 2/2 acute blood loss anemia Hemoglobin dropped to 6.3 on admission with hypotension requiring initiation of vasopressors.  Patient was transfused 2 unit PRBCs; and subsequently vasopressors have been weaned off. -- Hgb 6.3>7.8>7.2>>7.5>8.7>8.7>9.9>10.2 -- Protonix  40 mg IV q12h -- Carafate  1 g p.o. before every meal/at bedtime -- CBC daily  COPD Chronic hypoxic respiratory failure on home O2 4L [place] -- Breo Ellipta  1 puff daily -- Atrovent  neb twice daily -- Xopenex  neb every 6 hours as needed wheezing/shortness of breath -- Continue supplemental oxygen, maintain SpO2 greater than 88%  Hyponatremia, chronic Na 134>>135>136>138  Hypokalemia Repleted, potassium 3.8 this morning. -- Repeat electrolytes and magnesium  History of CVA -- Continue to hold home aspirin  and Brilinta  in  setting of anemia as above -- Atorvastatin  80 mg p.o. daily  Chronic pain syndrome -- Cymbalta  60 mg p.o. daily -- Oxycodone  5 mg p.o. every 4 hours as needed moderate pain -- Morphine  1 mg IV every 3 hours as needed severe pain  Restless leg syndrome -- Pramipexole  0.25 mL p.o. twice daily  Hypothyroidism -- Levothyroxine  5 mcg p.o. daily  Cognitive impairment -- Namenda  10 mg p.o. twice daily -- Supportive care  Left upper lobe nodular opacity 9 mm left upper lobe nodular opacity likely infectious versus inflammatory. --Will need CT follow-up 3 months outpatient  Weakness/ability/deconditioning: From [facility] and rehab. -- PT/OT recommending return to SNF -- TOC consulted   DVT prophylaxis: SCDs Start: 06/13/23 0448    Code Status: Limited: Do not attempt resuscitation (DNR) -DNR-LIMITED -Do Not Intubate/DNI  Family Communication: No family present bedside this morning  Disposition Plan:  Level of care: Progressive Status is: Inpatient Remains inpatient appropriate because: Pending further workup/plans per specialist, GI/cardiology/vascular surgery    Consultants:  [facility] gastroenterology Cardiology Vascular surgery  Procedures:  TTE  Antimicrobials:  Azithromycin  12/29 - 10/31 Levaquin  12/28 - 12/28   Subjective: Patient seen examined bedside, resting calmly.  Lying in bed.  Abdominal pain much improved, tolerating diet.  Denies any pain after meals.  Discussed with GI, they recommend vascular surgery consult given restenosis of SMA stents, although patient with no signs/symptoms of mesenteric ischemia at this time.  Consulted vascular surgery, Dr. Sheree.  Also GI with no plans for EGD at this time.  Denies headache, no dizziness, no chest pain, palpitations, no shortness of breath more than her typical baseline, no fever/chills, no nausea/vomiting/diarrhea, no focal weakness, no cough/congestion, no fatigue, no paresthesia.  No acute concerns  overnight per nursing staff.  Objective: Vitals:  06/16/23 0845 06/16/23 0900 06/16/23 1100 06/16/23 1110  BP: 113/81  (!) 116/91 (!) 116/91  Pulse: (!) 103 (!) 105 97 96  Resp: 18   18  Temp: 97.8 F (36.6 C)  98.3 F (36.8 C) 97.8 F (36.6 C)  TempSrc: Oral  Oral Oral  SpO2: 99%   100%  Weight:      Height:        Intake/Output Summary (Last 24 hours) at 06/16/2023 1300 Last data filed at 06/16/2023 0856 Gross per 24 hour  Intake 220 ml  Output 1000 ml  Net -780 ml   Filed Weights   06/14/23 0441 06/15/23 0503 06/16/23 0600  Weight: 66 kg 60.6 kg 61.9 kg    Examination:  Physical Exam: GEN: NAD, alert and oriented x 3, chronically ill/elderly in appearance HEENT: NCAT, PERRL, EOMI, sclera clear, MMM PULM: CTAB w/o wheezes/crackles, normal respiratory effort, on 2L [place] (4L baseline) CV: RRR w/o M/G/R GI: abd soft, NTND, NABS, no R/G/M MSK: no peripheral edema, moves all extremities independently NEURO: No focal neurological deficit PSYCH: normal mood/affect Integumentary: No concerning rashes/lesions/wounds noted on exposed skin surfaces.    Data Reviewed: I have personally reviewed following labs and imaging studies  CBC: Recent Labs  Lab 06/12/23 2333 06/12/23 2353 06/13/23 0555 06/13/23 1143 06/13/23 2035 06/14/23 0753 06/15/23 0742 06/16/23 0308  WBC 19.8*  --  18.1*  --   --  17.9* 14.3* 13.2*  HGB 6.3*   < > 7.2* 7.5* 8.7* 8.7* 9.9* 10.2*  HCT 22.3*   < > 23.2* 24.0* 26.8* 26.7* 30.5* 32.7*  MCV 87.5  --  86.2  --   --  84.0 84.7 86.5  PLT 507*  --  358  --   --  397 437* 438*   < > = values in this interval not displayed.   Basic Metabolic Panel: Recent Labs  Lab 06/12/23 2333 06/12/23 2353 06/13/23 0555 06/14/23 0753 06/15/23 0307 06/16/23 0308  NA 134* 134* 135 135 136 138  K 3.9 3.8 4.4 3.8 3.3* 3.8  CL 93*  --  96* 94* 93* 99  CO2 26  --  27 29 29 27   GLUCOSE 274*  --  121* 101* 98 93  BUN 18  --  18 17 21 21   CREATININE 0.92  --   0.84 0.83 0.85 0.84  CALCIUM  8.8*  --  9.8 9.4 9.1 9.0  MG  --   --  2.3  --   --  2.1  PHOS  --   --  3.7  --   --   --    GFR: Estimated Creatinine Clearance: 40.8 mL/min (by C-G formula based on SCr of 0.84 mg/dL). Liver Function Tests: Recent Labs  Lab 06/12/23 2333  AST 29  ALT 21  ALKPHOS 72  BILITOT 0.4  PROT 7.0  ALBUMIN 3.3*   No results for input(s): LIPASE, AMYLASE in the last 168 hours. No results for input(s): AMMONIA in the last 168 hours. Coagulation Profile: Recent Labs  Lab 06/12/23 2333  INR 1.0   Cardiac Enzymes: No results for input(s): CKTOTAL, CKMB, CKMBINDEX, TROPONINI in the last 168 hours. BNP (last 3 results) No results for input(s): PROBNP in the last 8760 hours. HbA1C: No results for input(s): HGBA1C in the last 72 hours.  CBG: Recent Labs  Lab 06/15/23 1146 06/15/23 1619 06/15/23 2116 06/16/23 0621 06/16/23 1131  GLUCAP 95 146* 91 141* 113*   Lipid Profile: No results for input(s): CHOL,  HDL, LDLCALC, TRIG, CHOLHDL, LDLDIRECT in the last 72 hours.  Thyroid Function Tests: No results for input(s): TSH, T4TOTAL, FREET4, T3FREE, THYROIDAB in the last 72 hours.  Anemia Panel: No results for input(s): VITAMINB12, FOLATE, FERRITIN, TIBC, IRON, RETICCTPCT in the last 72 hours.  Sepsis Labs: Recent Labs  Lab 06/13/23 0555 06/13/23 1143 06/13/23 2240 06/14/23 0753  PROCALCITON 0.20  --   --   --   LATICACIDVEN  --  1.9 2.1* 1.1    Recent Results (from the past 240 hours)  Resp panel by RT-PCR (RSV, Flu A&B, Covid) Anterior Nasal Swab     Status: None   Collection Time: 06/13/23  3:44 AM   Specimen: Anterior Nasal Swab  Result Value Ref Range Status   SARS Coronavirus 2 by RT PCR NEGATIVE NEGATIVE Final   Influenza A by PCR NEGATIVE NEGATIVE Final   Influenza B by PCR NEGATIVE NEGATIVE Final    Comment: (NOTE) The Xpert Xpress SARS-CoV-2/FLU/RSV plus assay is intended as an  aid in the diagnosis of influenza from Nasopharyngeal swab specimens and should not be used as a sole basis for treatment. Nasal washings and aspirates are unacceptable for Xpert Xpress SARS-CoV-2/FLU/RSV testing.  Fact Sheet for Patients: bloggercourse.com  Fact Sheet for Healthcare Providers: seriousbroker.it  This test is not yet approved or cleared by the [place]  FDA and has been authorized for detection and/or diagnosis of SARS-CoV-2 by FDA under an Emergency Use Authorization (EUA). This EUA will remain in effect (meaning this test can be used) for the duration of the COVID-19 declaration under Section 564(b)(1) of the Act, 21 U.S.C. section 360bbb-3(b)(1), unless the authorization is terminated or revoked.     Resp Syncytial Virus by PCR NEGATIVE NEGATIVE Final    Comment: (NOTE) Fact Sheet for Patients: bloggercourse.com  Fact Sheet for Healthcare Providers: seriousbroker.it  This test is not yet approved or cleared by the [place]  FDA and has been authorized for detection and/or diagnosis of SARS-CoV-2 by FDA under an Emergency Use Authorization (EUA). This EUA will remain in effect (meaning this test can be used) for the duration of the COVID-19 declaration under Section 564(b)(1) of the Act, 21 U.S.C. section 360bbb-3(b)(1), unless the authorization is terminated or revoked.  Performed at [facility], [address], [place], [place] [address]   MRSA Next Gen by PCR, Nasal     Status: None   Collection Time: 06/14/23  1:11 AM   Specimen: Sputum; Nasal Swab  Result Value Ref Range Status   MRSA by PCR Next Gen NOT DETECTED NOT DETECTED Final    Comment: (NOTE) The GeneXpert MRSA Assay (FDA approved for NASAL specimens only), is one component of a comprehensive MRSA colonization surveillance program. It is not intended to diagnose MRSA infection  nor to guide or monitor treatment for MRSA infections. Test performance is not FDA approved in patients less than 77 years old. Performed at [facility], [address], [place], [place] [address]   Respiratory (~20 pathogens) panel by PCR     Status: None   Collection Time: 06/14/23  1:27 AM   Specimen: Nasopharyngeal Swab; Respiratory  Result Value Ref Range Status   Adenovirus NOT DETECTED NOT DETECTED Final   Coronavirus 229E NOT DETECTED NOT DETECTED Final    Comment: (NOTE) The Coronavirus on the Respiratory Panel, DOES NOT test for the novel  Coronavirus (2019 nCoV)    Coronavirus HKU1 NOT DETECTED NOT DETECTED Final   Coronavirus NL63 NOT  DETECTED NOT DETECTED Final   Coronavirus OC43 NOT DETECTED NOT DETECTED Final   Metapneumovirus NOT DETECTED NOT DETECTED Final   Rhinovirus / Enterovirus NOT DETECTED NOT DETECTED Final   Influenza A NOT DETECTED NOT DETECTED Final   Influenza B NOT DETECTED NOT DETECTED Final   Parainfluenza Virus 1 NOT DETECTED NOT DETECTED Final   Parainfluenza Virus 2 NOT DETECTED NOT DETECTED Final   Parainfluenza Virus 3 NOT DETECTED NOT DETECTED Final   Parainfluenza Virus 4 NOT DETECTED NOT DETECTED Final   Respiratory Syncytial Virus NOT DETECTED NOT DETECTED Final   Bordetella pertussis NOT DETECTED NOT DETECTED Final   Bordetella Parapertussis NOT DETECTED NOT DETECTED Final   Chlamydophila pneumoniae NOT DETECTED NOT DETECTED Final   Mycoplasma pneumoniae NOT DETECTED NOT DETECTED Final    Comment: Performed at [facility], [address] [place], [place] [address]         Radiology Studies: CT Angio Abd/Pel w/ and/or w/o Result Date: 06/16/2023 CLINICAL DATA:  Chronic mesenteric ischemia EXAM: CTA ABDOMEN AND PELVIS WITHOUT AND WITH CONTRAST TECHNIQUE: Multidetector CT imaging of the abdomen and pelvis was performed using the standard protocol during bolus administration of intravenous contrast. Multiplanar reconstructed  images and MIPs were obtained and reviewed to evaluate the vascular anatomy. RADIATION DOSE REDUCTION: This exam was performed according to the departmental dose-optimization program which includes automated exposure control, adjustment of the mA and/or kV according to patient size and/or use of iterative reconstruction technique. CONTRAST:  75mL OMNIPAQUE  IOHEXOL  350 MG/ML SOLN COMPARISON:  06/13/2023 and previous FINDINGS: VASCULAR Aorta: Extensive partially calcified atheromatous plaque, with some eccentric mural thrombus in the suprarenal segment no aneurysm, dissection, or stenosis. Celiac: Atheromatous.  Patent ostial stent with only mild stenosis. SMA: Calcified ostial plaque post stenting. There is focal low-attenuation intraluminal in the proximal stent suggesting neointimal hyperplasia, of possible hemodynamic significance. Renals: Single right, with eccentric calcified ostial plaque extending over length of at least 2.2 cm, of possible hemodynamic significance. Single left, with calcified ostial plaque resulting in short segment stenosis of at least mild severity, patent distally. IMA: Origin stenosis related to aortic wall plaque, patent distally. Inflow: Moderate nonocclusive bilateral common iliac artery plaque. Mild bilateral external iliac artery plaque. No high-grade stenosis or aneurysm. Internal iliacs are atheromatous, nonaneurysmal. Proximal Outflow: Mild scattered nonocclusive calcified plaque. Veins: Patent hepatic veins, portal vein, SM V, bilateral renal veins, iliac venous system and IVC. Review of the MIP images confirms the above findings. NON-VASCULAR Lower chest: No pleural or pericardial effusion. Dependent atelectasis/consolidation at both lung bases. 6 mm perifissural nodule, lateral basal segment right lower lobe, stable since 10/22/2016, probably lymph node. Linear scarring or atelectasis in the posterior lingula. Pulmonary emphysema. Partially calcified occlusive plaque at the  left subclavian artery origin. Hepatobiliary: No focal liver abnormality is seen. No gallstones, gallbladder wall thickening, or biliary dilatation. Pancreas: Unremarkable. No pancreatic ductal dilatation or surrounding inflammatory changes. Spleen: Normal in size without focal abnormality. Adrenals/Urinary Tract: No adrenal mass. Lobular renal contours with symmetric enhancement. No evident urolithiasis or hydronephrosis. Urinary bladder partially distended. Stomach/Bowel: Stomach nondistended. Small bowel nondilated. Colon is partially distended, with scattered distal descending diverticula; no adjacent inflammatory change. Rectal prolapse. Lymphatic:   No enlarged abdominal or pelvic lymph nodes. Reproductive: Status post hysterectomy. No adnexal masses. Other: No ascites.  No free air. Musculoskeletal: Thoracolumbar levoscoliosis apex L1 with multilevel spondylitic change. IMPRESSION: 1. 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 2.  SMA stent re-stenosis. 3.  Eccentric calcified ostial plaque right renal artery, of possible hemodynamic significance. 4. Descending colonic diverticulosis. 5. Rectal prolapse. 6. Aortic Atherosclerosis (ICD10-I70.0) and Emphysema (ICD10-J43.9). NON-VASCULAR Electronically Signed   By: JONETTA Faes M.D.   On: 06/16/2023 08:28        Scheduled Meds:  sodium chloride    Intravenous Once   atorvastatin   80 mg Oral Daily   DULoxetine   60 mg Oral Daily   fluticasone  furoate-vilanterol  1 puff Inhalation Daily   insulin  aspart  0-9 Units Subcutaneous TID WC   ipratropium  0.5 mg Nebulization BID   levothyroxine   75 mcg Oral QAC breakfast   memantine   10 mg Oral BID   metoprolol  succinate  12.5 mg Oral Daily   pantoprazole  (PROTONIX ) IV  40 mg Intravenous Q12H   polyethylene glycol  17 g Oral Daily   pramipexole   0.25 mg Oral BID   sacubitril -valsartan   1 tablet Oral BID   sucralfate   1 g Oral TID WC & HS   Continuous Infusions:     LOS: 3 days    Time  spent: 52 minutes spent on chart review, discussion with nursing staff, consultants, updating family and interview/physical exam; more than 50% of that time was spent in counseling and/or coordination of care.    Camellia PARAS Mechel Schutter, DO [facility] Available via Epic secure chat 7am-7pm After these hours, please refer to coverage provider listed on [email or web address] 06/16/2023, 1:00 PM

## 2023-06-16 NOTE — Consult Note (Signed)
 Hospital Consult    Reason for Consult:  abdominal pain Referring Physician:  Dr. Austria MRN #:  [id number]  History of Present Illness: This is a 89 y.o. female with remote history of mesenteric stenting at least 10 years ago in [place] .  She states that she was a previous smoker has not been a smoker since that time.  10 years ago she was having severe abdominal pain that was relieved with stenting.  She has not been followed with duplex studies does not remember the last time the stents were evaluated.  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  She states that she does have occasional abdominal pain but nothing as severe as previous stenting.  Currently she is not having any abdominal pain on my exam.  She denies any food fear or postprandial pain but does states that she has significant reflux which limits her ability to eat although without significant weight change recently.  Past Medical History:  Diagnosis Date   Acid reflux    COPD (chronic obstructive pulmonary disease) (HCC)    Hypertension     Past Surgical History:  Procedure Laterality Date   BIOPSY  10/17/2021   Procedure: BIOPSY;  Surgeon: Dianna Specking, MD;  Location: [facility];  Service: Gastroenterology;;   BIOPSY  01/29/2023   Procedure: BIOPSY;  Surgeon: Dianna Specking, MD;  Location: [facility];  Service: Gastroenterology;;   COLONOSCOPY N/A 10/17/2021   Procedure: COLONOSCOPY;  Surgeon: Dianna Specking, MD;  Location: [facility];  Service: Gastroenterology;  Laterality: N/A;   COLONOSCOPY WITH PROPOFOL  N/A 10/18/2021   Procedure: COLONOSCOPY WITH PROPOFOL ;  Surgeon: Elicia Claw, MD;  Location: [facility];  Service: Gastroenterology;  Laterality: N/A;   ESOPHAGOGASTRODUODENOSCOPY N/A 10/17/2021   Procedure:  ESOPHAGOGASTRODUODENOSCOPY (EGD);  Surgeon: Dianna Specking, MD;  Location: [facility];  Service: Gastroenterology;  Laterality: N/A;   FLEXIBLE SIGMOIDOSCOPY N/A 01/29/2023   Procedure: FLEXIBLE SIGMOIDOSCOPY;  Surgeon: Dianna Specking, MD;  Location: [facility];  Service: Gastroenterology;  Laterality: N/A;   POLYPECTOMY  10/18/2021   Procedure: POLYPECTOMY;  Surgeon: Elicia Claw, MD;  Location: [facility];  Service: Gastroenterology;;    Allergies  Allergen Reactions   Penicillins Anaphylaxis, Swelling and Other (See Comments)   Penicillin G Hives    Prior to Admission medications   Medication Sig Start Date End Date Taking? Authorizing Provider  acetaminophen  (TYLENOL ) 500 MG tablet Take 1,000 mg by mouth in the morning, at noon, and at bedtime.   Yes [provider]  albuterol  (VENTOLIN  HFA) 108 (90 Base) MCG/ACT inhaler Inhale 2 puffs into the lungs every 6 (six) hours as needed for wheezing or shortness of breath.   Yes [provider]  Artificial Saliva (BIOTENE DRY MOUTH MOISTURIZING) SOLN Use as directed 1 Capful in the mouth or throat in the morning and at bedtime. Swish and spit   Yes [provider]  aspirin  EC 81 MG tablet Take 81 mg by mouth daily. Swallow whole.   Yes [provider]  atorvastatin  (LIPITOR ) 40 MG tablet Take 1 tablet (40 mg total) by mouth daily. 11/06/22  Yes Kathrin Mignon DASEN, MD  BREO ELLIPTA  200-25 MCG/ACT AEPB Inhale 1 puff into the lungs daily. 11/03/22  Yes [provider]  Calcium  Carb-Cholecalciferol  (CALCIUM  + VITAMIN D3 PO) Take 1 tablet by mouth daily.   Yes [provider]  DULoxetine  (CYMBALTA ) 60 MG capsule Take 60 mg by mouth daily. 06/11/23  Yes [provider]  fluticasone  (FLONASE) 50 MCG/ACT nasal spray Place 1 spray into both nostrils daily.   Yes [provider]  gabapentin  (NEURONTIN ) 600 MG tablet Take 0.5 tablets (300 mg total) by mouth 3 (three) times  daily. Patient taking differently: Take 600 mg by mouth 3 (three) times daily. 01/30/23  Yes Briana Elgin LABOR, MD  Ipratropium-Albuterol  (COMBIVENT) 20-100 MCG/ACT AERS respimat Inhale 1 puff into the lungs 2 (two) times daily.   Yes [provider]  levothyroxine  (SYNTHROID , LEVOTHROID) 75 MCG tablet Take 75 mcg by mouth daily before breakfast. 02/05/16  Yes [provider]  lidocaine  (HM LIDOCAINE  PATCH) 4 % Place 1 patch onto the skin daily.   Yes [provider]  memantine  (NAMENDA ) 10 MG tablet Take 10 mg by mouth 2 (two) times daily.   Yes [provider]  Multiple Vitamin (MULTIVITAMIN WITH MINERALS) TABS tablet Take 1 tablet by mouth daily with breakfast.   Yes [provider]  oxyCODONE  (ROXICODONE ) 15 MG immediate release tablet Take 1 tablet (15 mg total) by mouth daily as needed (Breakthrough pain). Patient taking differently: Take 15 mg by mouth daily as needed (Breakthrough pain between 0000-0400). 01/30/23  Yes Briana Elgin LABOR, MD  Oxycodone  HCl 10 MG TABS Take 1 tablet (10 mg total) by mouth 4 (four) times daily as needed (Severe pain). Patient taking differently: Take 10 mg by mouth 4 (four) times daily. 01/30/23  Yes Briana Elgin LABOR, MD  pantoprazole  (PROTONIX ) 40 MG tablet Take 1 tablet (40 mg total) by mouth 2 (two) times daily. 11/12/22 11/12/23 Yes Cherlyn Labella, MD  phenylephrine -shark liver oil-mineral oil-petrolatum (PREPARATION H) 0.25-14-74.9 % rectal ointment Place 1 Application rectally every 12 (twelve) hours.   Yes [provider]  PSYLLIUM HUSK PO Take 0.4 g by mouth in the morning and at bedtime.   Yes [provider]  senna-docusate (SENOKOT-S) 8.6-50 MG tablet Take 1 tablet by mouth 2 (two) times daily as needed for moderate constipation. Patient taking differently: Take 1 tablet by mouth daily. 11/06/22  Yes Gonfa, Taye T, MD  sodium chloride  (OCEAN) 0.65 % SOLN nasal spray Place 1 spray into both nostrils in  the morning, at noon, and at bedtime.   Yes [provider]  ticagrelor  (BRILINTA ) 90 MG TABS tablet Take 1 tablet (90 mg total) by mouth 2 (two) times daily. 11/06/22  Yes Gonfa, Taye T, MD  traZODone (DESYREL) 50 MG tablet Take 50 mg by mouth at bedtime. 05/05/23  Yes [provider]  vitamin C (ASCORBIC ACID ) 500 MG tablet Take 500 mg by mouth daily.   Yes [provider]  ferrous gluconate  (FERGON) 324 MG tablet Take 324 mg by mouth daily with breakfast. Patient not taking: Reported on 06/13/2023    [provider]  polyethylene glycol (MIRALAX  / GLYCOLAX ) 17 g packet Take 17 g by mouth 2 (two) times daily as needed for mild constipation. Patient not taking: Reported on 06/13/2023 11/06/22   Gonfa, Taye T, MD  pramipexole  (MIRAPEX ) 0.25 MG tablet Take 1 tablet (0.25 mg total) by mouth 2 (two) times daily. Patient taking differently: Take 0.25 mg by mouth 3 (three) times daily. 01/30/23   Briana Elgin LABOR, MD  tiZANidine (ZANAFLEX) 4 MG tablet Take 4 mg by mouth every 6 (six) hours as needed for muscle spasms. Patient not taking: Reported on 06/13/2023    [provider]  Social History   Socioeconomic History   Marital status: Single    Spouse name: Not on file   Number of children: Not on file   Years of education: Not on file   Highest education level: Not on file  Occupational History   Not on file  Tobacco Use   Smoking status: Every Day    Current packs/day: 1.50    Types: Cigarettes   Smokeless tobacco: Never  Vaping Use   Vaping status: Never Used  Substance and Sexual Activity   Alcohol use: No   Drug use: No   Sexual activity: Not on file  Other Topics Concern   Not on file  Social History Narrative   Not on file   Social Drivers of Health   Financial Resource Strain: Not on file  Food Insecurity: No Food Insecurity (06/14/2023)   Hunger Vital Sign    Worried About Running Out of Food in the Last Year: Never true     Ran Out of Food in the Last Year: Never true  Transportation Needs: No Transportation Needs (06/14/2023)   PRAPARE - Administrator, Civil Service (Medical): No    Lack of Transportation (Non-Medical): No  Physical Activity: Not on file  Stress: Not on file  Social Connections: Patient Declined (06/14/2023)   Social Connection and Isolation Panel [NHANES]    Frequency of Communication with Friends and Family: Patient declined    Frequency of Social Gatherings with Friends and Family: Patient declined    Attends Religious Services: Patient declined    Database Administrator or Organizations: Patient declined    Attends Banker Meetings: Patient declined    Marital Status: Patient declined  Intimate Partner Violence: Not At Risk (06/14/2023)   Humiliation, Afraid, Rape, and Kick questionnaire    Fear of Current or Ex-Partner: No    Emotionally Abused: No    Physically Abused: No    Sexually Abused: No     History reviewed. No pertinent family history.  Review of Systems  Constitutional: Negative.   HENT: Negative.    Eyes: Negative.   Respiratory:  Positive for shortness of breath.   Gastrointestinal:  Positive for abdominal pain, blood in stool and heartburn.  Musculoskeletal: Negative.   Skin: Negative.   Neurological: Negative.   Endo/Heme/Allergies: Negative.      Physical Examination  Vitals:   06/16/23 1100 06/16/23 1110  BP: (!) 116/91 (!) 116/91  Pulse: 97 96  Resp:  18  Temp: 98.3 F (36.8 C) 97.8 F (36.6 C)  SpO2:  100%   Body mass index is 25.57 kg/m.  Physical Exam HENT:     Head: Normocephalic.     Nose: Nose normal.  Eyes:     Pupils: Pupils are equal, round, and reactive to light.  Cardiovascular:     Rate and Rhythm: Normal rate.     Pulses:          Femoral pulses are 2+ on the right side and 2+ on the left side.      Posterior tibial pulses are 1+ on the right side and 1+ on the left side.  Pulmonary:      Effort: Pulmonary effort is normal.  Abdominal:     Palpations: Abdomen is soft.  Musculoskeletal:     Cervical back: Normal range of motion.     Right lower leg: No edema.     Left lower leg: No edema.  Skin:    General:  Skin is warm and dry.     Capillary Refill: Capillary refill takes less than 2 seconds.  Neurological:     General: No focal deficit present.     Mental Status: She is alert. Mental status is at baseline.       CBC    Component Value Date/Time   WBC 13.2 (H) 06/16/2023 0308   RBC 3.78 (L) 06/16/2023 0308   HGB 10.2 (L) 06/16/2023 0308   HCT 32.7 (L) 06/16/2023 0308   PLT 438 (H) 06/16/2023 0308   MCV 86.5 06/16/2023 0308   MCH 27.0 06/16/2023 0308   MCHC 31.2 06/16/2023 0308   RDW 18.0 (H) 06/16/2023 0308   LYMPHSABS 1.3 01/28/2023 1309   MONOABS 1.3 (H) 01/28/2023 1309   EOSABS 0.5 01/28/2023 1309   BASOSABS 0.0 01/28/2023 1309    BMET    Component Value Date/Time   NA 138 06/16/2023 0308   K 3.8 06/16/2023 0308   CL 99 06/16/2023 0308   CO2 27 06/16/2023 0308   GLUCOSE 93 06/16/2023 0308   BUN 21 06/16/2023 0308   CREATININE 0.84 06/16/2023 0308   CALCIUM  9.0 06/16/2023 0308   GFRNONAA >60 06/16/2023 0308   GFRAA >60 06/14/2016 1554    COAGS: Lab Results  Component Value Date   INR 1.0 06/12/2023   INR 0.9 11/03/2022   INR 0.9 01/29/2021     Non-Invasive Vascular Imaging:   CT IMPRESSION: 1. 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 2.  SMA stent re-stenosis. 3. Eccentric calcified ostial plaque right renal artery, of possible hemodynamic significance. 4. Descending colonic diverticulosis. 5. Rectal prolapse. 6. Aortic Atherosclerosis (ICD10-I70.0) and Emphysema (ICD10-J43.9).  ASSESSMENT/PLAN: This is a 89 y.o. female with remote history of stenting of her celiac and SMA arteries for what sounds like acute mesenteric ischemia or at least severe chronic mesenteric ischemia.  The celiac stent is patent the SMA does  have in-stent stenosis and these have not been followed.  She should continue to aspirin  and statin as tolerable and we will follow her mesenteric stents as an outpatient in 6 months unless she has issues prior to that time.  Vascular surgery will be available as needed.  Elizbeth Posa C. Sheree, MD [facility] Office: [phone] Pager: [phone]

## 2023-06-16 NOTE — Plan of Care (Signed)
   Problem: Education: Goal: Knowledge of General Education information will improve Description: Including pain rating scale, medication(s)/side effects and non-pharmacologic comfort measures Outcome: Progressing   Problem: Clinical Measurements: Goal: Will remain free from infection Outcome: Progressing   Problem: Clinical Measurements: Goal: Diagnostic test results will improve Outcome: Progressing

## 2023-06-16 NOTE — Progress Notes (Signed)
 Progress Note  Patient Name: Amy Mckay Date of Encounter: 06/16/2023  Primary Cardiologist: None   Subjective   Patient seen examined at her bedside.  Inpatient Medications    Scheduled Meds:  sodium chloride    Intravenous Once   atorvastatin   80 mg Oral Daily   DULoxetine   60 mg Oral Daily   fluticasone  furoate-vilanterol  1 puff Inhalation Daily   insulin  aspart  0-9 Units Subcutaneous TID WC   ipratropium  0.5 mg Nebulization BID   levothyroxine   75 mcg Oral QAC breakfast   memantine   10 mg Oral BID   pantoprazole  (PROTONIX ) IV  40 mg Intravenous Q12H   polyethylene glycol  17 g Oral Daily   pramipexole   0.25 mg Oral BID   sucralfate   1 g Oral TID WC & HS   Continuous Infusions:  PRN Meds: docusate sodium , HYDROmorphone  (DILAUDID ) injection, levalbuterol , morphine  injection, ondansetron  (ZOFRAN ) IV, oxyCODONE    Vital Signs    Vitals:   06/16/23 0413 06/16/23 0600 06/16/23 0757 06/16/23 0845  BP: (!) 127/96   113/81  Pulse: (!) 102   (!) 103  Resp: 20   18  Temp: 98.3 F (36.8 C)   97.8 F (36.6 C)  TempSrc: Oral   Oral  SpO2: 98%  97% 99%  Weight:  61.9 kg    Height:        Intake/Output Summary (Last 24 hours) at 06/16/2023 0920 Last data filed at 06/16/2023 0856 Gross per 24 hour  Intake 220 ml  Output 1000 ml  Net -780 ml   Filed Weights   06/14/23 0441 06/15/23 0503 06/16/23 0600  Weight: 66 kg 60.6 kg 61.9 kg    Telemetry    Sinus rhythm- Personally Reviewed  ECG    None today- Personally Reviewed  Physical Exam    General: Comfortable Head: Atraumatic, normal size  Eyes: PEERLA, EOMI  Neck: Supple, normal JVD Cardiac: Normal S1, S2; RRR; no murmurs, rubs, or gallops Lungs: Clear to auscultation bilaterally Abd: Soft, nontender, no hepatomegaly  Ext: warm, no edema Musculoskeletal: No deformities, BUE and BLE strength normal and equal Skin: Warm and dry, no rashes   Neuro: Alert and oriented to person, place, time, and  situation, CNII-XII grossly intact, no focal deficits  Psych: Normal mood and affect   Labs    Chemistry Recent Labs  Lab 06/12/23 2333 06/12/23 2353 06/14/23 0753 06/15/23 0307 06/16/23 0308  NA 134*   < > 135 136 138  K 3.9   < > 3.8 3.3* 3.8  CL 93*   < > 94* 93* 99  CO2 26   < > 29 29 27   GLUCOSE 274*   < > 101* 98 93  BUN 18   < > 17 21 21   CREATININE 0.92   < > 0.83 0.85 0.84  CALCIUM  8.8*   < > 9.4 9.1 9.0  PROT 7.0  --   --   --   --   ALBUMIN 3.3*  --   --   --   --   AST 29  --   --   --   --   ALT 21  --   --   --   --   ALKPHOS 72  --   --   --   --   BILITOT 0.4  --   --   --   --   GFRNONAA >60   < > >60 >60 >60  ANIONGAP 15   < >  12 14 12    < > = values in this interval not displayed.     Hematology Recent Labs  Lab 06/14/23 0753 06/15/23 0742 06/16/23 0308  WBC 17.9* 14.3* 13.2*  RBC 3.18* 3.60* 3.78*  HGB 8.7* 9.9* 10.2*  HCT 26.7* 30.5* 32.7*  MCV 84.0 84.7 86.5  MCH 27.4 27.5 27.0  MCHC 32.6 32.5 31.2  RDW 16.3* 17.2* 18.0*  PLT 397 437* 438*    Cardiac EnzymesNo results for input(s): TROPONINI in the last 168 hours. No results for input(s): TROPIPOC in the last 168 hours.   BNP Recent Labs  Lab 06/12/23 2333  BNP 502.9*     DDimer No results for input(s): DDIMER in the last 168 hours.   Radiology    CT Angio Abd/Pel w/ and/or w/o Result Date: 06/16/2023 CLINICAL DATA:  Chronic mesenteric ischemia EXAM: CTA ABDOMEN AND PELVIS WITHOUT AND WITH CONTRAST TECHNIQUE: Multidetector CT imaging of the abdomen and pelvis was performed using the standard protocol during bolus administration of intravenous contrast. Multiplanar reconstructed images and MIPs were obtained and reviewed to evaluate the vascular anatomy. RADIATION DOSE REDUCTION: This exam was performed according to the departmental dose-optimization program which includes automated exposure control, adjustment of the mA and/or kV according to patient size and/or use of  iterative reconstruction technique. CONTRAST:  75mL OMNIPAQUE  IOHEXOL  350 MG/ML SOLN COMPARISON:  06/13/2023 and previous FINDINGS: VASCULAR Aorta: Extensive partially calcified atheromatous plaque, with some eccentric mural thrombus in the suprarenal segment no aneurysm, dissection, or stenosis. Celiac: Atheromatous.  Patent ostial stent with only mild stenosis. SMA: Calcified ostial plaque post stenting. There is focal low-attenuation intraluminal in the proximal stent suggesting neointimal hyperplasia, of possible hemodynamic significance. Renals: Single right, with eccentric calcified ostial plaque extending over length of at least 2.2 cm, of possible hemodynamic significance. Single left, with calcified ostial plaque resulting in short segment stenosis of at least mild severity, patent distally. IMA: Origin stenosis related to aortic wall plaque, patent distally. Inflow: Moderate nonocclusive bilateral common iliac artery plaque. Mild bilateral external iliac artery plaque. No high-grade stenosis or aneurysm. Internal iliacs are atheromatous, nonaneurysmal. Proximal Outflow: Mild scattered nonocclusive calcified plaque. Veins: Patent hepatic veins, portal vein, SM V, bilateral renal veins, iliac venous system and IVC. Review of the MIP images confirms the above findings. NON-VASCULAR Lower chest: No pleural or pericardial effusion. Dependent atelectasis/consolidation at both lung bases. 6 mm perifissural nodule, lateral basal segment right lower lobe, stable since 10/22/2016, probably lymph node. Linear scarring or atelectasis in the posterior lingula. Pulmonary emphysema. Partially calcified occlusive plaque at the left subclavian artery origin. Hepatobiliary: No focal liver abnormality is seen. No gallstones, gallbladder wall thickening, or biliary dilatation. Pancreas: Unremarkable. No pancreatic ductal dilatation or surrounding inflammatory changes. Spleen: Normal in size without focal abnormality.  Adrenals/Urinary Tract: No adrenal mass. Lobular renal contours with symmetric enhancement. No evident urolithiasis or hydronephrosis. Urinary bladder partially distended. Stomach/Bowel: Stomach nondistended. Small bowel nondilated. Colon is partially distended, with scattered distal descending diverticula; no adjacent inflammatory change. Rectal prolapse. Lymphatic:   No enlarged abdominal or pelvic lymph nodes. Reproductive: Status post hysterectomy. No adnexal masses. Other: No ascites.  No free air. Musculoskeletal: Thoracolumbar levoscoliosis apex L1 with multilevel spondylitic change. IMPRESSION: 1. 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 2.  SMA stent re-stenosis. 3. Eccentric calcified ostial plaque right renal artery, of possible hemodynamic significance. 4. Descending colonic diverticulosis. 5. Rectal prolapse. 6. Aortic Atherosclerosis (ICD10-I70.0) and Emphysema (ICD10-J43.9). NON-VASCULAR Electronically Signed   By:  JONETTA Faes M.D.   On: 06/16/2023 08:28    Cardiac Studies   Echocardiogram  Patient Profile     89 y.o. female presented with GI bleeding and now new depressed ejection fraction with some wall motion abnormalities.  Assessment & Plan    NSTEMI suspect type II demand ischemia in the setting of acute blood loss Acute systolic heart failure seems to have improved, clinically close to her baseline back to 3 to 4 L nasal cannula Electrolyte abnormalities Acute blood loss anemia Acute on chronic respiratory failure this has improved back to baseline nasal cannula   Echo showed depressed EF with wall motion abnormalities.  Despite that she is currently not a candidate for cardiac catheterization and especially in the setting of acute bleeding will not be a candidate for antiplatelets for now.  In terms of guideline directed medical therapy her blood pressure is marginal but I think she may be able to tolerate initiation of some medicine: Will start Toprol -XL 12.5  mg this a.m., Entresto  24-26 mg twice daily first dose tonight.  Hoping to initiate additional GDMT prior to discharge. Hemoglobin improving  GI is considering EGD, no further discussion about the flexible sigmoidoscopy. She is not experiencing any angina symptoms if EGD is needed from a cardiovascular standpoint is important for us  to get this done for the patient giving her recent acute blood loss.  There have been no need for any ischemic evaluation prior to this.      For questions or updates, please contact [facility] Please consult [email or web address] for contact info under Cardiology/STEMI.      Signed, Kymberlyn Eckford, DO  06/16/2023, 9:20 AM

## 2023-06-17 DIAGNOSIS — I214 Non-ST elevation (NSTEMI) myocardial infarction: Secondary | ICD-10-CM | POA: Diagnosis not present

## 2023-06-17 LAB — BASIC METABOLIC PANEL
Anion gap: 13 (ref 5–15)
BUN: 18 mg/dL (ref 8–23)
CO2: 25 mmol/L (ref 22–32)
Calcium: 9.1 mg/dL (ref 8.9–10.3)
Chloride: 97 mmol/L — ABNORMAL LOW (ref 98–111)
Creatinine, Ser: 0.76 mg/dL (ref 0.44–1.00)
GFR, Estimated: >60 mL/min (ref >60)
Glucose, Bld: 116 mg/dL — ABNORMAL HIGH (ref 70–99)
Potassium: 4.2 mmol/L (ref 3.5–5.1)
Sodium: 135 mmol/L (ref 135–145)

## 2023-06-17 LAB — CBC
HCT: 37.5 % (ref 36.0–46.0)
Hemoglobin: 11.9 g/dL — ABNORMAL LOW (ref 12.0–15.0)
MCH: 27.5 pg (ref 26.0–34.0)
MCHC: 31.7 g/dL (ref 30.0–36.0)
MCV: 86.6 fL (ref 80.0–100.0)
Platelets: 434 10*3/uL — ABNORMAL HIGH (ref 150–400)
RBC: 4.33 MIL/uL (ref 3.87–5.11)
RDW: 18.2 % — ABNORMAL HIGH (ref 11.5–15.5)
WBC: 16.1 10*3/uL — ABNORMAL HIGH (ref 4.0–10.5)
nRBC: 0 % (ref 0.0–0.2)

## 2023-06-17 LAB — GLUCOSE, CAPILLARY
Glucose-Capillary: 111 mg/dL — ABNORMAL HIGH (ref 70–99)
Glucose-Capillary: 146 mg/dL — ABNORMAL HIGH (ref 70–99)

## 2023-06-17 MED ORDER — OXYCODONE HCL 5 MG PO TABS
5.0000 mg | ORAL_TABLET | ORAL | 0 refills | Status: AC | PRN
Start: 2023-06-17 — End: ?

## 2023-06-17 MED ORDER — SACUBITRIL-VALSARTAN 24-26 MG PO TABS: 1.0000 | ORAL_TABLET | Freq: Two times a day (BID) | ORAL | Status: DC

## 2023-06-17 MED ORDER — SENNOSIDES-DOCUSATE SODIUM 8.6-50 MG PO TABS: 1.0000 | ORAL_TABLET | Freq: Two times a day (BID) | ORAL | Status: DC

## 2023-06-17 MED ORDER — POLYETHYLENE GLYCOL 3350 17 G PO PACK: 17.0000 g | PACK | Freq: Every day | ORAL | Status: DC

## 2023-06-17 MED ORDER — METOPROLOL SUCCINATE ER 25 MG PO TB24: 12.5000 mg | ORAL_TABLET | Freq: Every day | ORAL | Status: DC

## 2023-06-17 MED ORDER — FERROUS GLUCONATE 324 (38 FE) MG PO TABS: 324.0000 mg | ORAL_TABLET | Freq: Every day | ORAL | Status: DC

## 2023-06-17 MED ORDER — SUCRALFATE 1 G PO TABS: 1.0000 g | ORAL_TABLET | Freq: Three times a day (TID) | ORAL | Status: DC

## 2023-06-17 MED ORDER — HYDROCORTISONE (PERIANAL) 2.5 % EX CREA: TOPICAL_CREAM | Freq: Four times a day (QID) | CUTANEOUS | Status: DC | PRN

## 2023-06-17 MED ORDER — ATORVASTATIN CALCIUM 80 MG PO TABS: 80.0000 mg | ORAL_TABLET | Freq: Every day | ORAL | Status: DC

## 2023-06-17 NOTE — Progress Notes (Signed)
 Progress Note  Patient Name: Amy Mckay Date of Encounter: 06/17/2023  Primary Cardiologist: None   Subjective   Patient seen examined at her bedside.  Inpatient Medications    Scheduled Meds:  sodium chloride    Intravenous Once   aspirin  EC  81 mg Oral Daily   atorvastatin   80 mg Oral Daily   DULoxetine   60 mg Oral Daily   fluticasone  furoate-vilanterol  1 puff Inhalation Daily   insulin  aspart  0-9 Units Subcutaneous TID WC   ipratropium  0.5 mg Nebulization BID   levothyroxine   75 mcg Oral QAC breakfast   memantine   10 mg Oral BID   metoprolol  succinate  12.5 mg Oral Daily   pantoprazole   40 mg Oral BID   polyethylene glycol  17 g Oral Daily   pramipexole   0.25 mg Oral BID   sacubitril -valsartan   1 tablet Oral BID   sucralfate   1 g Oral TID WC & HS   ticagrelor   90 mg Oral BID   Continuous Infusions:  PRN Meds: docusate sodium , hydrocortisone , HYDROmorphone  (DILAUDID ) injection, levalbuterol , morphine  injection, ondansetron  (ZOFRAN ) IV, oxyCODONE    Vital Signs    Vitals:   06/17/23 0611 06/17/23 0729 06/17/23 0818 06/17/23 0820  BP:  110/69    Pulse:  (!) 105    Resp:  16    Temp:  97.8 F (36.6 C)    TempSrc:  Oral    SpO2:  99% 98% 98%  Weight: 61.4 kg     Height:        Intake/Output Summary (Last 24 hours) at 06/17/2023 1022 Last data filed at 06/16/2023 1957 Gross per 24 hour  Intake 180 ml  Output 601 ml  Net -421 ml   Filed Weights   06/15/23 0503 06/16/23 0600 06/17/23 0611  Weight: 60.6 kg 61.9 kg 61.4 kg    Telemetry    Sinus rhythm- Personally Reviewed  ECG    None today- Personally Reviewed  Physical Exam    General: Comfortable Head: Atraumatic, normal size  Eyes: PEERLA, EOMI  Neck: Supple, normal JVD Cardiac: Normal S1, S2; RRR; no murmurs, rubs, or gallops Lungs: Clear to auscultation bilaterally Abd: Soft, nontender, no hepatomegaly  Ext: warm, no edema Musculoskeletal: No deformities, BUE and BLE strength  normal and equal Skin: Warm and dry, no rashes   Neuro: Alert and oriented to person, place, time, and situation, CNII-XII grossly intact, no focal deficits  Psych: Normal mood and affect   Labs    Chemistry Recent Labs  Lab 06/12/23 2333 06/12/23 2353 06/15/23 0307 06/16/23 0308 06/17/23 0304  NA 134*   < > 136 138 135  K 3.9   < > 3.3* 3.8 4.2  CL 93*   < > 93* 99 97*  CO2 26   < > 29 27 25   GLUCOSE 274*   < > 98 93 116*  BUN 18   < > 21 21 18   CREATININE 0.92   < > 0.85 0.84 0.76  CALCIUM  8.8*   < > 9.1 9.0 9.1  PROT 7.0  --   --   --   --   ALBUMIN 3.3*  --   --   --   --   AST 29  --   --   --   --   ALT 21  --   --   --   --   ALKPHOS 72  --   --   --   --  BILITOT 0.4  --   --   --   --   GFRNONAA >60   < > >60 >60 >60  ANIONGAP 15   < > 14 12 13    < > = values in this interval not displayed.     Hematology Recent Labs  Lab 06/15/23 0742 06/16/23 0308 06/17/23 0304  WBC 14.3* 13.2* 16.1*  RBC 3.60* 3.78* 4.33  HGB 9.9* 10.2* 11.9*  HCT 30.5* 32.7* 37.5  MCV 84.7 86.5 86.6  MCH 27.5 27.0 27.5  MCHC 32.5 31.2 31.7  RDW 17.2* 18.0* 18.2*  PLT 437* 438* 434*    Cardiac EnzymesNo results for input(s): TROPONINI in the last 168 hours. No results for input(s): TROPIPOC in the last 168 hours.   BNP Recent Labs  Lab 06/12/23 2333  BNP 502.9*     DDimer No results for input(s): DDIMER in the last 168 hours.   Radiology    CT Angio Abd/Pel w/ and/or w/o Result Date: 06/16/2023 CLINICAL DATA:  Chronic mesenteric ischemia EXAM: CTA ABDOMEN AND PELVIS WITHOUT AND WITH CONTRAST TECHNIQUE: Multidetector CT imaging of the abdomen and pelvis was performed using the standard protocol during bolus administration of intravenous contrast. Multiplanar reconstructed images and MIPs were obtained and reviewed to evaluate the vascular anatomy. RADIATION DOSE REDUCTION: This exam was performed according to the departmental dose-optimization program which includes  automated exposure control, adjustment of the mA and/or kV according to patient size and/or use of iterative reconstruction technique. CONTRAST:  75mL OMNIPAQUE  IOHEXOL  350 MG/ML SOLN COMPARISON:  06/13/2023 and previous FINDINGS: VASCULAR Aorta: Extensive partially calcified atheromatous plaque, with some eccentric mural thrombus in the suprarenal segment no aneurysm, dissection, or stenosis. Celiac: Atheromatous.  Patent ostial stent with only mild stenosis. SMA: Calcified ostial plaque post stenting. There is focal low-attenuation intraluminal in the proximal stent suggesting neointimal hyperplasia, of possible hemodynamic significance. Renals: Single right, with eccentric calcified ostial plaque extending over length of at least 2.2 cm, of possible hemodynamic significance. Single left, with calcified ostial plaque resulting in short segment stenosis of at least mild severity, patent distally. IMA: Origin stenosis related to aortic wall plaque, patent distally. Inflow: Moderate nonocclusive bilateral common iliac artery plaque. Mild bilateral external iliac artery plaque. No high-grade stenosis or aneurysm. Internal iliacs are atheromatous, nonaneurysmal. Proximal Outflow: Mild scattered nonocclusive calcified plaque. Veins: Patent hepatic veins, portal vein, SM V, bilateral renal veins, iliac venous system and IVC. Review of the MIP images confirms the above findings. NON-VASCULAR Lower chest: No pleural or pericardial effusion. Dependent atelectasis/consolidation at both lung bases. 6 mm perifissural nodule, lateral basal segment right lower lobe, stable since 10/22/2016, probably lymph node. Linear scarring or atelectasis in the posterior lingula. Pulmonary emphysema. Partially calcified occlusive plaque at the left subclavian artery origin. Hepatobiliary: No focal liver abnormality is seen. No gallstones, gallbladder wall thickening, or biliary dilatation. Pancreas: Unremarkable. No pancreatic ductal  dilatation or surrounding inflammatory changes. Spleen: Normal in size without focal abnormality. Adrenals/Urinary Tract: No adrenal mass. Lobular renal contours with symmetric enhancement. No evident urolithiasis or hydronephrosis. Urinary bladder partially distended. Stomach/Bowel: Stomach nondistended. Small bowel nondilated. Colon is partially distended, with scattered distal descending diverticula; no adjacent inflammatory change. Rectal prolapse. Lymphatic:   No enlarged abdominal or pelvic lymph nodes. Reproductive: Status post hysterectomy. No adnexal masses. Other: No ascites.  No free air. Musculoskeletal: Thoracolumbar levoscoliosis apex L1 with multilevel spondylitic change. IMPRESSION: 1. 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 2.  SMA stent re-stenosis. 3.  Eccentric calcified ostial plaque right renal artery, of possible hemodynamic significance. 4. Descending colonic diverticulosis. 5. Rectal prolapse. 6. Aortic Atherosclerosis (ICD10-I70.0) and Emphysema (ICD10-J43.9). NON-VASCULAR Electronically Signed   By: JONETTA Faes M.D.   On: 06/16/2023 08:28    Cardiac Studies   Echocardiogram  Patient Profile     89 y.o. female presented with GI bleeding and now new depressed ejection fraction with some wall motion abnormalities.  Assessment & Plan    NSTEMI suspect type II demand ischemia in the setting of acute blood loss Acute systolic heart failure seems to have improved, clinically close to her baseline back to 3 to 4 L nasal cannula Electrolyte abnormalities Acute blood loss anemia Acute on chronic respiratory failure this has improved back to baseline nasal cannula   Echo showed depressed EF with wall motion abnormalities.  Despite that she is currently not a candidate for cardiac catheterization and especially in the setting of acute bleeding.    GI okay with starting of antiplatelet therapy.  She has been started on aspirin  and Plavix . Hemoglobin stable.    In  terms of guideline directed medical therapy her blood pressure is marginal but I think she may be able to tolerate initiation of some medicine: She has tolerated the Toprol -XL as well as Entresto .   SGLT 2 inhibitors and Aldactone can be added likely at the time of discharge or in the outpatient setting.   At this time there is no plan to pursue further GI testing.  She was evaluated by vascular surgery no plans for any intervention at this time either.   For questions or updates, please contact [facility] Please consult [email or web address] for contact info under Cardiology/STEMI.      Signed, Missi Mcmackin, DO  06/17/2023, 10:22 AM

## 2023-06-17 NOTE — Plan of Care (Signed)

## 2023-06-17 NOTE — Care Management Important Message (Signed)
 Important Message  Patient Details  Name: Amy Mckay MRN: [id number] Date of Birth: 05-03-1935   Important Message Given:  Yes - Medicare IM     Renie Ora 06/17/2023, 11:16 AM

## 2023-06-17 NOTE — NC FL2 (Signed)
 [place]  MEDICAID FL2 LEVEL OF CARE FORM     IDENTIFICATION  Patient Name: Amy Mckay Birthdate: 1935/05/29 Sex: female Admission Date (Current Location): 06/12/2023  [facility] and [place] Number:  Producer, Television/film/video and Address:  [facility], [address] [place], [place] [address]      Provider Number: [id number]  Attending Physician Name and Address:  [place], Camellia PARAS, DO  Relative Name and Phone Number:       Current Level of Care: Hospital Recommended Level of Care: Skilled Nursing Facility Prior Approval Number:    Date Approved/Denied:   PASRR Number: [id number]  Discharge Plan: SNF    Current Diagnoses: Patient Active Problem List   Diagnosis Date Noted   NSTEMI (non-ST elevated myocardial infarction) (HCC) 06/13/2023   Respiratory failure, acute (HCC) 06/13/2023   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/28/2023   History of CVA (cerebrovascular accident) 11/03/2022   Heme positive stool 10/17/2021   Esophageal dysphagia 10/14/2021   Acute toxic encephalopathy 10/14/2021   Orthostatic hypotension 10/14/2021   Near syncope 10/13/2021   Iron deficiency anemia due to chronic blood loss 10/13/2021   Chronic respiratory failure with hypoxia (HCC) 10/13/2021   Chronic lower back pain 10/13/2021   Tobacco use disorder 10/13/2021   Pulmonary nodules/lesions, multiple 10/13/2021   Acute on chronic respiratory failure with hypoxia (HCC) 02/02/2021   COVID-19 virus infection 02/01/2021   AKI (acute kidney injury) (HCC) 02/01/2021   Peripheral arterial disease (HCC) 02/01/2021   Benign essential HTN 02/01/2021   COPD (chronic obstructive pulmonary disease) (HCC) 02/01/2021   Cigarette smoker 02/01/2021   Hypothyroidism 02/01/2021   Memory impairment 02/01/2021   Restless leg 02/01/2021   Chronic hyponatremia 02/01/2021   Colitis 01/29/2021    Orientation RESPIRATION BLADDER Height & Weight     Self, Time, Situation, Place  O2 (2L nasal cannula)  Continent Weight: 135 lb 5.8 oz (61.4 kg) Height:  5' 1.25 (155.6 cm)  BEHAVIORAL SYMPTOMS/MOOD NEUROLOGICAL BOWEL NUTRITION STATUS      Continent Diet (see d/c summary)  AMBULATORY STATUS COMMUNICATION OF NEEDS Skin   Extensive Assist Verbally Normal                       Personal Care Assistance Level of Assistance  Dressing, Feeding, Bathing Bathing Assistance: Limited assistance Feeding assistance: Independent Dressing Assistance: Limited assistance     Functional Limitations Info  Sight, Speech, Hearing Sight Info: Impaired Hearing Info: Adequate Speech Info: Adequate    SPECIAL CARE FACTORS FREQUENCY                       Contractures Contractures Info: Not present    Additional Factors Info  Code Status, Allergies Code Status Info: DNR Allergies Info: Penicillins, Penicillin G           Current Medications (06/17/2023):  This is the current hospital active medication list Current Facility-Administered Medications  Medication Dose Route Frequency Provider Last Rate Last Admin   0.9 %  sodium chloride  infusion (Manually program via Guardrails IV Fluids)   Intravenous Once Babcock, Peter E, NP       aspirin  EC tablet 81 mg  81 mg Oral Daily Austria, Eric J, DO   81 mg at 06/17/23 0831   atorvastatin  (LIPITOR ) tablet 80 mg  80 mg Oral Daily Babcock, Peter E, NP   80 mg at 06/17/23 0831   docusate sodium  (COLACE) capsule 100 mg  100 mg Oral BID  PRN Babcock, Peter E, NP       DULoxetine  (CYMBALTA ) DR capsule 60 mg  60 mg Oral Daily [place], Eric J, DO   60 mg at 06/16/23 1705   fluticasone  furoate-vilanterol (BREO ELLIPTA ) 200-25 MCG/ACT 1 puff  1 puff Inhalation Daily Babcock, Peter E, NP   1 puff at 06/17/23 0820   hydrocortisone  (ANUSOL -HC) 2.5 % rectal cream   Rectal QID PRN [place], Eric J, DO   1 Application at 06/16/23 1958   HYDROmorphone  (DILAUDID ) injection 0.5 mg  0.5 mg Intravenous Q3H PRN Elmaghraby, Zaki, MD   0.5 mg at 06/14/23 1826   insulin   aspart (novoLOG ) injection 0-9 Units  0-9 Units Subcutaneous TID WC [place], Eric J, DO   1 Units at 06/16/23 0635   ipratropium (ATROVENT ) nebulizer solution 0.5 mg  0.5 mg Nebulization BID [place], Eric J, DO   0.5 mg at 06/17/23 0818   levalbuterol  (XOPENEX ) nebulizer solution 0.63 mg  0.63 mg Nebulization Q6H PRN Babcock, Peter E, NP   0.63 mg at 06/13/23 2154   levothyroxine  (SYNTHROID ) tablet 75 mcg  75 mcg Oral QAC breakfast Babcock, Peter E, NP   75 mcg at 06/17/23 9166   memantine  (NAMENDA ) tablet 10 mg  10 mg Oral BID [place], Eric J, DO   10 mg at 06/17/23 9158   metoprolol  succinate (TOPROL -XL) 24 hr tablet 12.5 mg  12.5 mg Oral Daily Tobb, Kardie, DO   12.5 mg at 06/17/23 0834   morphine  (PF) 2 MG/ML injection 1 mg  1 mg Intravenous Q3H PRN [place], Eric J, DO   1 mg at 06/17/23 9158   ondansetron  (ZOFRAN ) injection 4 mg  4 mg Intravenous Q6H PRN Laveda Roosevelt, MD   4 mg at 06/17/23 0851   oxyCODONE  (Oxy IR/ROXICODONE ) immediate release tablet 5-10 mg  5-10 mg Oral Q4H PRN [place], Eric J, DO       pantoprazole  (PROTONIX ) EC tablet 40 mg  40 mg Oral BID [place], Eric J, DO   40 mg at 06/17/23 0832   polyethylene glycol (MIRALAX  / GLYCOLAX ) packet 17 g  17 g Oral Daily Vreeland, Claire H, DO   17 g at 06/15/23 9085   pramipexole  (MIRAPEX ) tablet 0.25 mg  0.25 mg Oral BID Babcock, Peter E, NP   0.25 mg at 06/17/23 9166   sacubitril -valsartan  (ENTRESTO ) 24-26 mg per tablet  1 tablet Oral BID Tobb, Kardie, DO   1 tablet at 06/17/23 0841   sucralfate  (CARAFATE ) 1 GM/10ML suspension 1 g  1 g Oral TID WC & HS Kriss Stagger H, DO   1 g at 06/17/23 9395   ticagrelor  (BRILINTA ) tablet 90 mg  90 mg Oral BID [place], Eric J, DO   90 mg at 06/17/23 9158     Discharge Medications: Please see discharge summary for a list of discharge medications.  Relevant Imaging Results:  Relevant Lab Results:   Additional Information SSN: [id number]  Luann SHAUNNA Cumming, LCSW

## 2023-06-17 NOTE — TOC Transition Note (Signed)
 Transition of Care [facility]) - Discharge Note   Patient Details  Name: Amy Mckay MRN: [id number] Date of Birth: 12/15/1934  [facility] CM/SW Contact:  Luann SHAUNNA Cumming, LCSW Phone Number: 06/17/2023, 11:39 AM   Clinical Narrative:     CSW confirmed with [place] that pt is LTC resident at [facility] and can return today.   Per MD patient ready for DC to . RN, patient, and facility notified of DC. Discharge Summary and FL2 sent to facility. RN to call report prior to discharge [phone]). DC packet on chart. Ambulance transport requested for patient.   CSW will sign off for now as social work intervention is no longer needed. Please consult us  again if new needs arise.   Final next level of care: Skilled Nursing Facility Barriers to Discharge: No Barriers Identified     Discharge Placement              Patient chooses bed at: [facility] Patient to be transferred to facility by: [facility] Name of family member notified: Called daughter [name], no voicemail available. Patient and family notified of of transfer: 06/17/23  Discharge Plan and Services Additional resources added to the After Visit Summary for                                       Social Drivers of Health (SDOH) Interventions SDOH Screenings   Food Insecurity: No Food Insecurity (06/14/2023)  Housing: High Risk (06/14/2023)  Transportation Needs: No Transportation Needs (06/14/2023)  Utilities: At Risk (06/14/2023)  Social Connections: Patient Declined (06/14/2023)  Tobacco Use: High Risk (06/12/2023)     Readmission Risk Interventions     No data to display

## 2023-06-17 NOTE — Discharge Summary (Signed)
 Physician Discharge Summary  Amy Mckay FMW:[id number] DOB: March 05, 1935 DOA: 06/12/2023  PCP: Karlene Mungo, MD  Admit date: 06/12/2023 Discharge date: 06/17/2023  Admitted From: [facility] and Rehab Disposition:  SNF  Recommendations for Outpatient Follow-up:  Follow up with PCP in 1-2 weeks Follow-up with [facility] gastroenterology in 2-3 weeks Follow-up with [facility] surgery, Dr. Teresa as scheduled on 06/22/2023 for rectal prolapse Follow-up with pulmonology, Dr. Shelah as scheduled on 07/01/2023 Follow-up with cardiology Follow-up with vascular surgery, Dr. Sheree in 6 months Started on metoprolol  and Entresto  by cardiology; consider initiation of Jardiance outpatient Recommend repeat CBC/BMP 1 week Will need repeat CT chest without contrast 3-6 months for follow-up left upper lobe nodule  Discharge Condition: Stable CODE STATUS: DNR Diet recommendation: Heart healthy diet  History of present illness:  Amy Mckay is a 89 y.o. female with past medical history significant for COPD on 3L [place] at home, mesenteric ischemia s/p SMA stenting, chronic hyponatremia, CVA, chronic rectal prolapse, hypothyroidism, chronic dysphagia/esophageal dysmotility, chronic pain syndrome who presented to [facility] ED on 12/28 from [facility] with shortness of breath.  Patient also states having rectal pain and noted to have some blood when wiping over the past month On EMS arrival, patient was noted to be hypoxic with SpO2 80% on 4L [place]. Patient was treated with epi pen, solumedrol, nebs, and IV mag. Placed on CPAP; and transported to the ED for further evaluation.     Patient last admitted on 01/2023 w/ proctitis.    In the ED, temperature 97.9 F, HR 124, RR 16, BP 111/85, SpO2 100% on BiPAP.  WBC 19.8, hemoglobin 6.3, platelet count 507.  Sodium 134, potassium 3.9, chloride 93, CO2 26, glucose 274, BUN 18, creatinine 0.92.  BNP 502.9.  High sensitivity troponin 130>166>242>229.  Chest  x-ray with diffuse interstitial prominence with patchy bibasilar groundglass airspace disease reflective of pulmonary edema versus atypical infection.CT angiogram chest negative for pulm embolism, areas of interstitial/septal thickening lung bases/anterior lung suspicious for pulmonary edema, atypical infection minimal consolidation/atelectasis dependently in the lower lobes, ill-defined nodular opacities left upper lobe measuring up to 9 mm likely infectious or inflammatory, many of the right lung pulmonary nodules on prior exam unchanged, enlarged right hilar lymph node likely reactive, moderate emphysema.  Patient was given Levaquin .  While in the ED patient became hypotensive and transfuse 1 unit PRBCs.  PCCM consulted for ICU admission.   Significant Hospital events: 12/28: Admit to ICU, platelet failure on BiPAP, hypotensive requiring Levophed  12/29: Weaned off of Levophed  gtt; Cardiology and [facility] consulted 12/30: Transferred to [facility] 12/31: Patient, complaining of abdominal discomfort, rectal prolapse, hemoglobin stable, GI ordered CTA A/P; cardiology with no plans for additional testing 1/1: CT angiogram notable for restenosis of SMA stent, GI recommends vascular surgery consultation, no plans for EGD at this point, hemoglobin stable 1/2: Discharging to [facility] course:  Acute on chronic respiratory failure with hypoxia Patient is laying in the ED with progressive shortness of breath, O2 dependent on 4 L at baseline but SpO2 80% on EMS arrival.  Patient is afebrile with elevated WBC count of 19.8.  COVID/influenza/RSV PCR negative.  Respiratory viral panel negative.  Imaging suggestive of pulmonary edema versus atypical infection.  Initially requiring BiPAP which has been weaned off.  Continue supplemental oxygen, only requiring 2-3 L at time of discharge.  Acute systolic congestive heart failure exacerbation Patient presenting with progressive shortness of breath, chest x-ray with  diffuse interstitial prominence concerning  for pulmonary edema.  BNP elevated 502.9.  TTE with LVEF 40-45%, LV mildly decreased function, with regional wall motion normalities, grade 3 diastolic dysfunction, LA mild/moderately dilated, IVC dilated.  Cardiology was consulted and followed during hospital course.  Patient was started on Metoprolol  succinate 12.5 mg p.o. daily, and Entresto  24-26 mg p.o. twice daily.  Consideration of initiation of Jardiance outpatient.  Outpatient follow-up with cardiology.   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 Lactic acid 1.9>2.1>1.1.  CT abdomen/pelvis with contrast celiac artery stent patent, similar moderate stenosis of proximal SMA, aortic atherosclerosis.  CT angiogram abdomen/pelvis with patent celiac artery stent, SMA stent restenosis.  [facility] was consulted and followed during hospital course.  Patient was transfused 2 unit PRBCs on 06/13/2023.  Initially her home aspirin  and Brilinta  were held.  Patient was supported with IV PPI and Carafate  with improvement of her hemoglobin to 11.9 at time of discharge.  Per GI no plans for EGD while inpatient, and recommended resumption of her antiplatelets.  Also seen by vascular surgery, Dr. Sheree regarding restenosis of SMA stent who recommended outpatient follow-up in 6 months.  Recommend repeat CBC 1 week.  Outpatient follow-up with gastroenterology, vascular surgery.   Atypical viral infection/pneumonia COVID/RSV/influenza A/B PCR negative.  Respiratory viral panel negative.  Strep pneumo urine antigen negative.  Completed course of azithromycin .   NSTEMI, likely type II demand ischemia in the setting of ABLA Troponin peaked at 242 during hospitalization.  Cardiology was consulted and followed during hospital course. TTE LVEF 40-45%, LV mildly decreased function, + LV RWMA, grade  3 diastolic dysfunction, LA mild/moderately dilated, IVC dilated.  On DAPT with aspirin /Brilinta ; on metoprolol  and Entresto .  Outpatient follow-up with cardiology.   Rectal prolapse, chronic Continue stool softener/MiraLAX .  Has outpatient follow-up with [facility] surgery, Dr. Teresa scheduled on 06/22/2023.   Hypovolemic shock 2/2 acute blood loss anemia Hemoglobin dropped to 6.3 on admission with hypotension requiring initiation of vasopressors.  Patient was transfused 2 unit PRBCs; and subsequently vasopressors have been weaned off.  Hemoglobin continued to trend up and remained stable, 11.9 at time of discharge.  Protonix  40 mg p.o. every 12 hours, Carafate  1 g p.o. 3 times daily AC.  Recommend repeat CBC 1 week.  Seen by GI, deferred EGD at this time.  Outpatient follow-up with your gastroenterology.   COPD Chronic hypoxic respiratory failure on home O2 4L  Breo Ellipta  1 puff daily, Combivent.  Albuterol  neb as needed.  Outpatient follow-up with pulmonology as scheduled   Hyponatremia, chronic Na 135 on discharge, stable.  Repeat BMP 1 week.   Hypokalemia Repleted during hospitalization, potassium 4.2 at time of discharge.   History of CVA Continue DAPT with aspirin /Brilinta .  Atorvastatin  80 mg p.o. daily   Chronic pain syndrome Cymbalta  60 mg p.o. daily, Oxycodone  5 mg p.o. every 6h as needed   Restless leg syndrome Pramipexole  0.25 mL p.o. twice daily   Hypothyroidism Levothyroxine  75 mcg p.o. daily   Cognitive impairment Namenda  10 mg p.o. twice daily   Left upper lobe nodular opacity 9 mm left upper lobe nodular opacity likely infectious versus inflammatory. Will need CT follow-up 3 months outpatient   Weakness/ability/deconditioning: From [facility] and rehab. PT/OT recommending return to SNF   Discharge Diagnoses:  Principal Problem:   NSTEMI (non-ST elevated myocardial infarction) [facility]) Active Problems:   Respiratory failure,  acute  [facility])    Discharge Instructions  Discharge Instructions     Call MD for:  difficulty breathing, headache or visual disturbances   Complete by: As directed    Call MD for:  extreme fatigue   Complete by: As directed    Call MD for:  persistant dizziness or light-headedness   Complete by: As directed    Call MD for:  persistant nausea and vomiting   Complete by: As directed    Call MD for:  severe uncontrolled pain   Complete by: As directed    Call MD for:  temperature >100.4   Complete by: As directed    Diet - low sodium heart healthy   Complete by: As directed    Increase activity slowly   Complete by: As directed       Allergies as of 06/17/2023       Reactions   Penicillins Anaphylaxis, Swelling, Other (See Comments)   Penicillin G Hives        Medication List     STOP taking these medications    gabapentin  600 MG tablet Commonly known as: NEURONTIN    HM Lidocaine  Patch 4 % Generic drug: lidocaine    tiZANidine 4 MG tablet Commonly known as: ZANAFLEX   traZODone 50 MG tablet Commonly known as: DESYREL       TAKE these medications    acetaminophen  500 MG tablet Commonly known as: TYLENOL  Take 1,000 mg by mouth in the morning, at noon, and at bedtime.   albuterol  108 (90 Base) MCG/ACT inhaler Commonly known as: VENTOLIN  HFA Inhale 2 puffs into the lungs every 6 (six) hours as needed for wheezing or shortness of breath.   ascorbic acid  500 MG tablet Commonly known as: VITAMIN C Take 500 mg by mouth daily.   aspirin  EC 81 MG tablet Take 81 mg by mouth daily. Swallow whole.   atorvastatin  80 MG tablet Commonly known as: LIPITOR  Take 1 tablet (80 mg total) by mouth daily. Start taking on: June 18, 2023 What changed:  medication strength how much to take   Biotene Dry Mouth Moisturizing Soln Use as directed 1 Capful in the mouth or throat in the morning and at bedtime. Swish and spit   Breo Ellipta  200-25 MCG/ACT Aepb Generic drug:  fluticasone  furoate-vilanterol Inhale 1 puff into the lungs daily.   CALCIUM  + VITAMIN D3 PO Take 1 tablet by mouth daily.   DULoxetine  60 MG capsule Commonly known as: CYMBALTA  Take 60 mg by mouth daily.   ferrous gluconate  324 MG tablet Commonly known as: FERGON Take 1 tablet (324 mg total) by mouth daily with breakfast.   fluticasone  50 MCG/ACT nasal spray Commonly known as: FLONASE Place 1 spray into both nostrils daily.   hydrocortisone  2.5 % rectal cream Commonly known as: ANUSOL -HC Place rectally 4 (four) times daily as needed for hemorrhoids or anal itching.   Ipratropium-Albuterol  20-100 MCG/ACT Aers respimat Commonly known as: COMBIVENT Inhale 1 puff into the lungs 2 (two) times daily.   levothyroxine  75 MCG tablet Commonly known as: SYNTHROID  Take 75 mcg by mouth daily before breakfast.   memantine  10 MG tablet Commonly known as: NAMENDA  Take 10 mg by mouth 2 (two) times daily.   metoprolol  succinate 25 MG 24 hr tablet Commonly known as: TOPROL -XL Take 0.5 tablets (12.5 mg total) by mouth daily. Start taking on: June 18, 2023   multivitamin with minerals Tabs tablet Take 1 tablet by mouth daily with breakfast.   oxyCODONE  5  MG immediate release tablet Commonly known as: Oxy IR/ROXICODONE  Take 1-2 tablets (5-10 mg total) by mouth every 4 (four) hours as needed for moderate pain (pain score 4-6). What changed:  medication strength how much to take when to take this reasons to take this Another medication with the same name was removed. Continue taking this medication, and follow the directions you see here.   pantoprazole  40 MG tablet Commonly known as: Protonix  Take 1 tablet (40 mg total) by mouth 2 (two) times daily.   polyethylene glycol 17 g packet Commonly known as: MIRALAX  / GLYCOLAX  Take 17 g by mouth daily. Start taking on: June 18, 2023 What changed:  when to take this reasons to take this   pramipexole  0.25 MG tablet Commonly  known as: MIRAPEX  Take 1 tablet (0.25 mg total) by mouth 2 (two) times daily.   Preparation H 0.25-14-74.9 % rectal ointment Generic drug: phenylephrine -shark liver oil-mineral oil-petrolatum Place 1 Application rectally every 12 (twelve) hours.   PSYLLIUM HUSK PO Take 0.4 g by mouth in the morning and at bedtime.   sacubitril -valsartan  24-26 MG Commonly known as: ENTRESTO  Take 1 tablet by mouth 2 (two) times daily.   senna-docusate 8.6-50 MG tablet Commonly known as: Senokot-S Take 1 tablet by mouth 2 (two) times daily. What changed:  when to take this reasons to take this   sodium chloride  0.65 % Soln nasal spray Commonly known as: OCEAN Place 1 spray into both nostrils in the morning, at noon, and at bedtime.   sucralfate  1 g tablet Commonly known as: Carafate  Take 1 tablet (1 g total) by mouth 4 (four) times daily -  with meals and at bedtime.   ticagrelor  90 MG Tabs tablet Commonly known as: BRILINTA  Take 1 tablet (90 mg total) by mouth 2 (two) times daily.        Follow-up Information     Karlene Mungo, MD. Schedule an appointment as soon as possible for a visit in 1 week(s).   Specialty: Internal Medicine Contact information: [address] [place] [address] [phone]         Teresa Lonni HERO, MD. Go on 06/22/2023.   Specialties: General Surgery, Colon and Rectal Surgery Why: F/U rectal Prolapse Contact information: [address] [place] [address] [phone]         Shelah Lamar RAMAN, MD. Go on 07/01/2023.   Specialty: Pulmonary Disease Contact information: [address] [place] [address] [phone]         Tobb, Kardie, DO Follow up.   Specialty: Cardiology Contact information: [address] [place] [address] [phone]         Gastroenterology, Margarete. Schedule an appointment as soon as possible for a visit in 2 week(s).   Contact information: [address] [place] [address] [phone]                Allergies  Allergen Reactions   Penicillins Anaphylaxis, Swelling and Other (See Comments)   Penicillin G Hives    Consultations: [facility] gastroenterology Cardiology Vascular surgery   Procedures/Studies: CT Angio Abd/Pel w/ and/or w/o Result Date: 06/16/2023 CLINICAL DATA:  Chronic mesenteric ischemia EXAM: CTA ABDOMEN AND PELVIS WITHOUT AND WITH CONTRAST TECHNIQUE: Multidetector CT imaging of the abdomen and pelvis was performed using the standard protocol during bolus administration of intravenous contrast. Multiplanar reconstructed images and MIPs were obtained and reviewed to evaluate the vascular anatomy. RADIATION DOSE REDUCTION:  This exam was performed according to the departmental dose-optimization program which includes automated exposure control, adjustment of the mA and/or kV according to patient size and/or use of iterative reconstruction technique. CONTRAST:  75mL OMNIPAQUE  IOHEXOL  350 MG/ML SOLN COMPARISON:  06/13/2023 and previous FINDINGS: VASCULAR Aorta: Extensive partially calcified atheromatous plaque, with some eccentric mural thrombus in the suprarenal segment no aneurysm, dissection, or stenosis. Celiac: Atheromatous.  Patent ostial stent with only mild stenosis. SMA: Calcified ostial plaque post stenting. There is focal low-attenuation intraluminal in the proximal stent suggesting neointimal hyperplasia, of possible hemodynamic significance. Renals: Single right, with eccentric calcified ostial plaque extending over length of at least 2.2 cm, of possible hemodynamic significance. Single left, with calcified ostial plaque resulting in short segment stenosis of at least mild severity, patent distally. IMA: Origin stenosis related to aortic wall plaque, patent distally. Inflow: Moderate nonocclusive bilateral common iliac artery plaque. Mild bilateral external iliac artery plaque. No high-grade stenosis or  aneurysm. Internal iliacs are atheromatous, nonaneurysmal. Proximal Outflow: Mild scattered nonocclusive calcified plaque. Veins: Patent hepatic veins, portal vein, SM V, bilateral renal veins, iliac venous system and IVC. Review of the MIP images confirms the above findings. NON-VASCULAR Lower chest: No pleural or pericardial effusion. Dependent atelectasis/consolidation at both lung bases. 6 mm perifissural nodule, lateral basal segment right lower lobe, stable since 10/22/2016, probably lymph node. Linear scarring or atelectasis in the posterior lingula. Pulmonary emphysema. Partially calcified occlusive plaque at the left subclavian artery origin. Hepatobiliary: No focal liver abnormality is seen. No gallstones, gallbladder wall thickening, or biliary dilatation. Pancreas: Unremarkable. No pancreatic ductal dilatation or surrounding inflammatory changes. Spleen: Normal in size without focal abnormality. Adrenals/Urinary Tract: No adrenal mass. Lobular renal contours with symmetric enhancement. No evident urolithiasis or hydronephrosis. Urinary bladder partially distended. Stomach/Bowel: Stomach nondistended. Small bowel nondilated. Colon is partially distended, with scattered distal descending diverticula; no adjacent inflammatory change. Rectal prolapse. Lymphatic:   No enlarged abdominal or pelvic lymph nodes. Reproductive: Status post hysterectomy. No adnexal masses. Other: No ascites.  No free air. Musculoskeletal: Thoracolumbar levoscoliosis apex L1 with multilevel spondylitic change. IMPRESSION: 1. 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 2.  SMA stent re-stenosis. 3. Eccentric calcified ostial plaque right renal artery, of possible hemodynamic significance. 4. Descending colonic diverticulosis. 5. Rectal prolapse. 6. Aortic Atherosclerosis (ICD10-I70.0) and Emphysema (ICD10-J43.9). NON-VASCULAR Electronically Signed   By: JONETTA Faes M.D.   On: 06/16/2023 08:28   ECHOCARDIOGRAM  COMPLETE Result Date: 06/14/2023    ECHOCARDIOGRAM REPORT   Patient Name:   ETHER GOEBEL [facility] Date of Exam: 06/14/2023 Medical Rec #:  [id number]          Height:       61.3 in Accession #:    [id number]         Weight:       145.5 lb Date of Birth:  [birth year]          BSA:          1.655 m Patient Age:    88 years           BP:           122/87 mmHg Patient Gender: F                  HR:           116 bpm. Exam Location:  Inpatient Procedure: 2D Echo, 3D Echo, Cardiac Doppler, Color Doppler and Intracardiac  Opacification Agent Indications:     121-121.4 ST elevation (STEMI) and non-ST elevation (NSTEMI)                  nyocardial infarction  History:         Patient has prior history of Echocardiogram examinations, most                  recent 11/04/2022. Previous Myocardial Infarction, Abnormal ECG,                  COPD and Stroke, Signs/Symptoms:Shortness of Breath, Dyspnea,                  Chest Pain and Altered Mental Status; Risk Factors:Current                  Smoker.  Sonographer:     Ellouise Mose RDCS Referring Phys:  [id number] [address] BABCOCK Diagnosing Phys: Ria Commander IMPRESSIONS  1. Left ventricular ejection fraction, by estimation, is 40%-45%. The left ventricle has moderately decreased function. The left ventricle demonstrates regional wall motion abnormalities (see scoring diagram/findings for description). The left ventricular internal cavity size was moderately dilated. Left ventricular diastolic parameters are consistent with Grade III diastolic dysfunction (restrictive).  2. Right ventricular systolic function is normal. The right ventricular size is normal.  3. Left atrial size was mild to moderately dilated.  4. The mitral valve is grossly normal. Trivial mitral valve regurgitation. No evidence of mitral stenosis. Moderate mitral annular calcification.  5. Tricuspid valve regurgitation is moderate.  6. The aortic valve is tricuspid. Aortic valve regurgitation is not visualized.  No aortic stenosis is present.  7. The inferior vena cava is dilated in size with >50% respiratory variability, suggesting right atrial pressure of 8 mmHg. FINDINGS  Left Ventricle: Left ventricular ejection fraction, by estimation, is 40 to 45%. The left ventricle has mildly decreased function. The left ventricle demonstrates regional wall motion abnormalities. The left ventricular internal cavity size was moderately dilated. There is no left ventricular hypertrophy. Left ventricular diastolic parameters are consistent with Grade III diastolic dysfunction (restrictive). Right Ventricle: The right ventricular size is normal. No increase in right ventricular wall thickness. Right ventricular systolic function is normal. Left Atrium: Left atrial size was mild to moderately dilated. Right Atrium: Right atrial size was normal in size. Pericardium: There is no evidence of pericardial effusion. Mitral Valve: The mitral valve is grossly normal. Moderate mitral annular calcification. Trivial mitral valve regurgitation. No evidence of mitral valve stenosis. MV peak gradient, 15.8 mmHg. The mean mitral valve gradient is 6.5 mmHg. Tricuspid Valve: The tricuspid valve is normal in structure. Tricuspid valve regurgitation is moderate . No evidence of tricuspid stenosis. Aortic Valve: The aortic valve is tricuspid. Aortic valve regurgitation is not visualized. No aortic stenosis is present. Pulmonic Valve: The pulmonic valve was normal in structure. Pulmonic valve regurgitation is not visualized. No evidence of pulmonic stenosis. Aorta: The aortic root and ascending aorta are structurally normal, with no evidence of dilitation. Venous: The inferior vena cava is dilated in size with greater than 50% respiratory variability, suggesting right atrial pressure of 8 mmHg. IAS/Shunts: No atrial level shunt detected by color flow Doppler.  LEFT VENTRICLE PLAX 2D LVIDd:         3.65 cm LVIDs:         2.70 cm LV PW:         1.83 cm LV IVS:  0.90 cm LVOT diam:     2.30 cm     3D Volume EF: LV SV:         81          3D EF:        40 % LV SV Index:   49          LV EDV:       118 ml LVOT Area:     4.15 cm    LV ESV:       71 ml                            LV SV:        47 ml  LV Volumes (MOD) LV vol d, MOD A2C: 75.1 ml LV vol d, MOD A4C: 76.0 ml LV vol s, MOD A2C: 55.9 ml LV vol s, MOD A4C: 46.0 ml LV SV MOD A2C:     19.2 ml LV SV MOD A4C:     76.0 ml LV SV MOD BP:      24.3 ml RIGHT VENTRICLE             IVC RV S prime:     10.30 cm/s  IVC diam: 2.10 cm TAPSE (M-mode): 2.0 cm LEFT ATRIUM             Index        RIGHT ATRIUM          Index LA diam:        2.10 cm 1.27 cm/m   RA Area:     9.66 cm LA Vol (A2C):   46.5 ml 28.09 ml/m  RA Volume:   18.40 ml 11.12 ml/m LA Vol (A4C):   36.4 ml 21.99 ml/m LA Biplane Vol: 43.7 ml 26.40 ml/m  AORTIC VALVE LVOT Vmax:   112.00 cm/s LVOT Vmean:  75.733 cm/s LVOT VTI:    0.196 m  AORTA Ao Root diam: 3.00 cm Ao Asc diam:  3.20 cm MITRAL VALVE                TRICUSPID VALVE MV Area (PHT): 7.99 cm     TR Peak grad:   43.0 mmHg MV Area VTI:   3.54 cm     TR Vmax:        328.00 cm/s MV Peak grad:  15.8 mmHg MV Mean grad:  6.5 mmHg     SHUNTS MV Vmax:       1.99 m/s     Systemic VTI:  0.20 m MV Vmean:      114.5 cm/s   Systemic Diam: 2.30 cm MV Decel Time: 95 msec MV E velocity: 192.00 cm/s Aditya Sabharwal Electronically signed by Ria Commander Signature Date/Time: 06/14/2023/9:37:53 AM    Final (Updated)    CT ABDOMEN PELVIS W CONTRAST Result Date: 06/13/2023 CLINICAL DATA:  Acute onset shortness of breath and rectal pain and blood red blood per rectum EXAM: CT ABDOMEN AND PELVIS WITH CONTRAST TECHNIQUE: Multidetector CT imaging of the abdomen and pelvis was performed using the standard protocol following bolus administration of intravenous contrast. RADIATION DOSE REDUCTION: This exam was performed according to the departmental dose-optimization program which includes automated exposure control,  adjustment of the mA and/or kV according to patient size and/or use of iterative reconstruction technique. CONTRAST:  75mL OMNIPAQUE  IOHEXOL  350 MG/ML SOLN COMPARISON:  CT abdomen and pelvis dated 01/28/2023, earlier same day CTA chest FINDINGS:  Lower chest: Diffuse interlobular septal thickening. Scattered subpleural ground-glass densities. Increased basilar right lower lobe consolidation. Similar minimal left basilar atelectasis/consolidation. Trace left pleural effusion. Partially imaged heart size is normal. Coronary calcifications. Hepatobiliary: No focal hepatic lesions. No intra or extrahepatic biliary ductal dilation. Normal gallbladder. Pancreas: Unchanged hypodensity in the pancreatic uncinate process measuring up to 1.1 cm (3:36) when measured similarly. No specific follow-up imaging recommended. Spleen: Normal in size without focal abnormality. Adrenals/Urinary Tract: No adrenal nodules. No suspicious renal mass, calculi or hydronephrosis. Excreted contrast material within the urinary bladder. Stomach/Bowel: Small hiatal hernia. Normal appearance of the stomach. Increased mural thickening and mucosal hyperenhancement of the prolapsed rectum. No abnormal bowel dilation. Colonic diverticulosis without acute diverticulitis. Appendix is not discretely seen. Vascular/Lymphatic: Aortic atherosclerosis. Celiac artery stent is patent. 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 The narrow caliber of the stent precludes evaluation of the SMA origin. No enlarged abdominal or pelvic lymph nodes. Reproductive: No adnexal masses. Other: No free fluid, fluid collection, or free air. Musculoskeletal: No acute or abnormal lytic or blastic osseous lesions. Multilevel degenerative changes of the partially imaged thoracic and lumbar spine. Increased perianal soft tissue stranding. Small fat-containing paraumbilical hernia. IMPRESSION: 1. Increased mural thickening and mucosal  hyperenhancement of the prolapsed rectum with increased perianal soft tissue stranding, consistent with proctitis. 2. Increased basilar right lower lobe consolidation, suspicious for atelectasis. 3. Diffuse interlobular septal thickening and scattered subpleural ground-glass densities, which may represent pulmonary edema. 4. Trace left pleural effusion. 5. Celiac artery stent is patent. 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 6. Aortic Atherosclerosis (ICD10-I70.0). Coronary artery calcifications. Assessment for potential risk factor modification, dietary therapy or pharmacologic therapy may be warranted, if clinically indicated. Electronically Signed   By: Limin  Xu M.D.   On: 06/13/2023 13:36   CT Angio Chest Pulmonary Embolism (PE) W or WO Contrast Result Date: 06/13/2023 CLINICAL DATA:  Pulmonary embolism (PE) suspected, high prob Respiratory distress. EXAM: CT ANGIOGRAPHY CHEST WITH CONTRAST TECHNIQUE: Multidetector CT imaging of the chest was performed using the standard protocol during bolus administration of intravenous contrast. Multiplanar CT image reconstructions and MIPs were obtained to evaluate the vascular anatomy. RADIATION DOSE REDUCTION: This exam was performed according to the departmental dose-optimization program which includes automated exposure control, adjustment of the mA and/or kV according to patient size and/or use of iterative reconstruction technique. CONTRAST:  50mL OMNIPAQUE  IOHEXOL  350 MG/ML SOLN COMPARISON:  Radiograph yesterday.  Chest CT 10/13/2021 FINDINGS: Cardiovascular: Heart is normal in size. There are no filling defects within the pulmonary arteries to suggest pulmonary embolus. Advanced calcified noncalcified atheromatous plaque throughout the thoracic aorta. This includes irregular plaque throughout the descending aorta. No aneurysm or acute aortic findings. 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 There are coronary artery  calcifications. No pericardial effusion. Mediastinum/Nodes: Enlarged 14 mm right hilar lymph node no enlarged mediastinal nodes. No thyroid nodule. Small hiatal hernia. Lungs/Pleura: Moderate emphysema. Moderate bronchial thickening. Areas of interstitial and septal thickening primarily involving the lung bases and anterior lungs. Minimal consolidation dependently in the lower lobes. 3 mm right upper lobe nodule series 6, image 53, unchanged. Perifissural 7 mm right lower lobe nodule series 6, image 95, unchanged. One of the right lower lobe nodules on prior has resolved, 1 of the right lower lobe nodules on prior is obscured on the current exam. There are in new ill-defined nodular opacities in the left upper lobe series 6, image  32 measuring 9 mm medially and 6 mm laterally. Ill-defined left upper lobe opacity series 6, image 51, new. Punctate benign granuloma in the left upper lobe. No significant pleural effusion. Upper Abdomen: No acute upper abdominal findings. Musculoskeletal: Mild scoliosis and thoracic spondylosis. There are no acute or suspicious osseous abnormalities. Review of the MIP images confirms the above findings. IMPRESSION: 1. No pulmonary embolus. 2. Areas of interstitial and septal thickening primarily involving the lung bases and anterior lungs, suspicious for pulmonary edema. Atypical infection could have a similar appearance. 3. Minimal consolidation/atelectasis dependently in the lower lobes. 4. New ill-defined nodular opacities in the left upper lobe measuring up to 9 mm, likely infectious or inflammatory. Recommend CT follow-up in 3 months. 5. Many of the right lung pulmonary nodules on prior exam are unchanged. At least 1 of the previous right lung nodules has resolved. No specific follow-up of these nodules is needed. 6. Enlarged right hilar lymph node, likely reactive. 7. Moderate emphysema. Aortic Atherosclerosis (ICD10-I70.0) and Emphysema (ICD10-J43.9). Electronically Signed   By:  Andrea Gasman M.D.   On: 06/13/2023 02:45   DG Chest Port 1 View Result Date: 06/12/2023 CLINICAL DATA:  Short of breath EXAM: PORTABLE CHEST 1 VIEW COMPARISON:  10/13/2021 FINDINGS: Single frontal view of the chest demonstrates a stable cardiac silhouette. Diffuse interstitial opacities are seen throughout the lungs, with patchy bibasilar ground-glass airspace disease. No effusion or pneumothorax. No acute bony abnormalities. IMPRESSION: 1. Diffuse interstitial prominence with patchy bibasilar ground-glass airspace disease, which may reflect pulmonary edema or atypical infection such as viral pneumonitis. Electronically Signed   By: Ozell Daring M.D.   On: 06/12/2023 23:55     Subjective: Patient seen examined bedside, resting calmly.  Lying in bed.  No specific complaints this morning.  Has upcoming appointments with general surgery and pulmonology for outpatient follow-up.  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  Hemoglobin continues to uptrend, restarted on Brilinta  and aspirin  yesterday.  GI with no plans for inpatient endoscopy.  Will also need close follow-up with GI outpatient.  No other specific complaints, questions, concerns at this time.  Denies headache, no dizziness, no chest pain, no palpitations, no shortness of breath, no abdominal pain, no fever/chills/night sweats, no nausea/vomiting/diarrhea, no focal weakness, no fatigue, no paresthesia.  No acute events overnight per nursing staff.  Discharge Exam: Vitals:   06/17/23 0818 06/17/23 0820  BP:    Pulse:    Resp:    Temp:    SpO2: 98% 98%   Vitals:   06/17/23 0611 06/17/23 0729 06/17/23 0818 06/17/23 0820  BP:  110/69    Pulse:  (!) 105    Resp:  16    Temp:  97.8 F (36.6 C)    TempSrc:  Oral    SpO2:  99% 98% 98%  Weight: 61.4 kg     Height:        Physical Exam: GEN: NAD, alert and oriented x 3, chronically ill/elderly in appearance HEENT: NCAT, PERRL, EOMI,  sclera clear, MMM PULM: CTAB w/o wheezes/crackles, normal respiratory effort, on 2L Kramer (4L baseline) CV: RRR w/o M/G/R GI: abd soft, NTND, NABS, no R/G/M MSK: no peripheral edema, moves all extremities independently NEURO: No focal neurological deficit PSYCH: normal mood/affect Integumentary: No concerning rashes/lesions/wounds noted on exposed skin surfaces.    The results of significant diagnostics from this hospitalization (including imaging, microbiology, ancillary and laboratory) are listed below for reference.  Microbiology: Recent Results (from the past 240 hours)  Resp panel by RT-PCR (RSV, Flu A&B, Covid) Anterior Nasal Swab     Status: None   Collection Time: 06/13/23  3:44 AM   Specimen: Anterior Nasal Swab  Result Value Ref Range Status   SARS Coronavirus 2 by RT PCR NEGATIVE NEGATIVE Final   Influenza A by PCR NEGATIVE NEGATIVE Final   Influenza B by PCR NEGATIVE NEGATIVE Final    Comment: (NOTE) The Xpert Xpress SARS-CoV-2/FLU/RSV plus assay is intended as an aid in the diagnosis of influenza from Nasopharyngeal swab specimens and should not be used as a sole basis for treatment. Nasal washings and aspirates are unacceptable for Xpert Xpress SARS-CoV-2/FLU/RSV testing.  Fact Sheet for Patients: bloggercourse.com  Fact Sheet for Healthcare Providers: seriousbroker.it  This test is not yet approved or cleared by the [place]  FDA and has been authorized for detection and/or diagnosis of SARS-CoV-2 by FDA under an Emergency Use Authorization (EUA). This EUA will remain in effect (meaning this test can be used) for the duration of the COVID-19 declaration under Section 564(b)(1) of the Act, 21 U.S.C. section 360bbb-3(b)(1), unless the authorization is terminated or revoked.     Resp Syncytial Virus by PCR NEGATIVE NEGATIVE Final    Comment: (NOTE) Fact Sheet for  Patients: bloggercourse.com  Fact Sheet for Healthcare Providers: seriousbroker.it  This test is not yet approved or cleared by the [place]  FDA and has been authorized for detection and/or diagnosis of SARS-CoV-2 by FDA under an Emergency Use Authorization (EUA). This EUA will remain in effect (meaning this test can be used) for the duration of the COVID-19 declaration under Section 564(b)(1) of the Act, 21 U.S.C. section 360bbb-3(b)(1), unless the authorization is terminated or revoked.  Performed at [facility], [address] [place], [place] [address]   MRSA Next Gen by PCR, Nasal     Status: None   Collection Time: 06/14/23  1:11 AM   Specimen: Sputum; Nasal Swab  Result Value Ref Range Status   MRSA by PCR Next Gen NOT DETECTED NOT DETECTED Final    Comment: (NOTE) The GeneXpert MRSA Assay (FDA approved for NASAL specimens only), is one component of a comprehensive MRSA colonization surveillance program. It is not intended to diagnose MRSA infection nor to guide or monitor treatment for MRSA infections. Test performance is not FDA approved in patients less than 67 years old. Performed at [facility], [address] [place], [place] [address]   Respiratory (~20 pathogens) panel by PCR     Status: None   Collection Time: 06/14/23  1:27 AM   Specimen: Nasopharyngeal Swab; Respiratory  Result Value Ref Range Status   Adenovirus NOT DETECTED NOT DETECTED Final   Coronavirus 229E NOT DETECTED NOT DETECTED Final    Comment: (NOTE) The Coronavirus on the Respiratory Panel, DOES NOT test for the novel  Coronavirus (2019 nCoV)    Coronavirus HKU1 NOT DETECTED NOT DETECTED Final   Coronavirus NL63 NOT DETECTED NOT DETECTED Final   Coronavirus OC43 NOT DETECTED NOT DETECTED Final   Metapneumovirus NOT DETECTED NOT DETECTED Final   Rhinovirus / Enterovirus NOT DETECTED NOT DETECTED Final   Influenza A NOT  DETECTED NOT DETECTED Final   Influenza B NOT DETECTED NOT DETECTED Final   Parainfluenza Virus 1 NOT DETECTED NOT DETECTED Final   Parainfluenza Virus 2 NOT DETECTED NOT DETECTED Final   Parainfluenza Virus 3 NOT DETECTED NOT DETECTED Final   Parainfluenza Virus  4 NOT DETECTED NOT DETECTED Final   Respiratory Syncytial Virus NOT DETECTED NOT DETECTED Final   Bordetella pertussis NOT DETECTED NOT DETECTED Final   Bordetella Parapertussis NOT DETECTED NOT DETECTED Final   Chlamydophila pneumoniae NOT DETECTED NOT DETECTED Final   Mycoplasma pneumoniae NOT DETECTED NOT DETECTED Final    Comment: Performed at [facility], [address] [place], [place] [address]     Labs: BNP (last 3 results) Recent Labs    06/12/23 2333  BNP 502.9*   Basic Metabolic Panel: Recent Labs  Lab 06/13/23 0555 06/14/23 0753 06/15/23 0307 06/16/23 0308 06/17/23 0304  NA 135 135 136 138 135  K 4.4 3.8 3.3* 3.8 4.2  CL 96* 94* 93* 99 97*  CO2 27 29 29 27 25   GLUCOSE 121* 101* 98 93 116*  BUN 18 17 21 21 18   CREATININE 0.84 0.83 0.85 0.84 0.76  CALCIUM  9.8 9.4 9.1 9.0 9.1  MG 2.3  --   --  2.1  --   PHOS 3.7  --   --   --   --    Liver Function Tests: Recent Labs  Lab 06/12/23 2333  AST 29  ALT 21  ALKPHOS 72  BILITOT 0.4  PROT 7.0  ALBUMIN 3.3*   No results for input(s): LIPASE, AMYLASE in the last 168 hours. No results for input(s): AMMONIA in the last 168 hours. CBC: Recent Labs  Lab 06/13/23 0555 06/13/23 1143 06/13/23 2035 06/14/23 0753 06/15/23 0742 06/16/23 0308 06/17/23 0304  WBC 18.1*  --   --  17.9* 14.3* 13.2* 16.1*  HGB 7.2*   < > 8.7* 8.7* 9.9* 10.2* 11.9*  HCT 23.2*   < > 26.8* 26.7* 30.5* 32.7* 37.5  MCV 86.2  --   --  84.0 84.7 86.5 86.6  PLT 358  --   --  397 437* 438* 434*   < > = values in this interval not displayed.   Cardiac Enzymes: No results for input(s): CKTOTAL, CKMB, CKMBINDEX, TROPONINI in the last 168 hours. BNP: Invalid  input(s): POCBNP CBG: Recent Labs  Lab 06/16/23 0621 06/16/23 1131 06/16/23 1538 06/16/23 2055 06/17/23 0544  GLUCAP 141* 113* 101* 76 111*   D-Dimer No results for input(s): DDIMER in the last 72 hours. Hgb A1c No results for input(s): HGBA1C in the last 72 hours. Lipid Profile No results for input(s): CHOL, HDL, LDLCALC, TRIG, CHOLHDL, LDLDIRECT in the last 72 hours. Thyroid function studies No results for input(s): TSH, T4TOTAL, T3FREE, THYROIDAB in the last 72 hours.  Invalid input(s): FREET3 Anemia work up No results for input(s): VITAMINB12, FOLATE, FERRITIN, TIBC, IRON, RETICCTPCT in the last 72 hours. Urinalysis    Component Value Date/Time   COLORURINE YELLOW 06/15/2023 0838   APPEARANCEUR HAZY (A) 06/15/2023 0838   LABSPEC 1.016 06/15/2023 0838   PHURINE 8.0 06/15/2023 0838   GLUCOSEU NEGATIVE 06/15/2023 0838   HGBUR SMALL (A) 06/15/2023 0838   BILIRUBINUR NEGATIVE 06/15/2023 0838   KETONESUR 5 (A) 06/15/2023 0838   PROTEINUR 100 (A) 06/15/2023 0838   NITRITE NEGATIVE 06/15/2023 0838   LEUKOCYTESUR TRACE (A) 06/15/2023 0838   Sepsis Labs Recent Labs  Lab 06/14/23 0753 06/15/23 0742 06/16/23 0308 06/17/23 0304  WBC 17.9* 14.3* 13.2* 16.1*   Microbiology Recent Results (from the past 240 hours)  Resp panel by RT-PCR (RSV, Flu A&B, Covid) Anterior Nasal Swab     Status: None   Collection Time: 06/13/23  3:44 AM   Specimen: Anterior  Nasal Swab  Result Value Ref Range Status   SARS Coronavirus 2 by RT PCR NEGATIVE NEGATIVE Final   Influenza A by PCR NEGATIVE NEGATIVE Final   Influenza B by PCR NEGATIVE NEGATIVE Final    Comment: (NOTE) The Xpert Xpress SARS-CoV-2/FLU/RSV plus assay is intended as an aid in the diagnosis of influenza from Nasopharyngeal swab specimens and should not be used as a sole basis for treatment. Nasal washings and aspirates are unacceptable for Xpert Xpress  SARS-CoV-2/FLU/RSV testing.  Fact Sheet for Patients: bloggercourse.com  Fact Sheet for Healthcare Providers: seriousbroker.it  This test is not yet approved or cleared by the [place]  FDA and has been authorized for detection and/or diagnosis of SARS-CoV-2 by FDA under an Emergency Use Authorization (EUA). This EUA will remain in effect (meaning this test can be used) for the duration of the COVID-19 declaration under Section 564(b)(1) of the Act, 21 U.S.C. section 360bbb-3(b)(1), unless the authorization is terminated or revoked.     Resp Syncytial Virus by PCR NEGATIVE NEGATIVE Final    Comment: (NOTE) Fact Sheet for Patients: bloggercourse.com  Fact Sheet for Healthcare Providers: seriousbroker.it  This test is not yet approved or cleared by the [place]  FDA and has been authorized for detection and/or diagnosis of SARS-CoV-2 by FDA under an Emergency Use Authorization (EUA). This EUA will remain in effect (meaning this test can be used) for the duration of the COVID-19 declaration under Section 564(b)(1) of the Act, 21 U.S.C. section 360bbb-3(b)(1), unless the authorization is terminated or revoked.  Performed at [facility], [address] [place], [place] [address]   MRSA Next Gen by PCR, Nasal     Status: None   Collection Time: 06/14/23  1:11 AM   Specimen: Sputum; Nasal Swab  Result Value Ref Range Status   MRSA by PCR Next Gen NOT DETECTED NOT DETECTED Final    Comment: (NOTE) The GeneXpert MRSA Assay (FDA approved for NASAL specimens only), is one component of a comprehensive MRSA colonization surveillance program. It is not intended to diagnose MRSA infection nor to guide or monitor treatment for MRSA infections. Test performance is not FDA approved in patients less than 54 years old. Performed at [facility], [address]  [place], [place] [address]   Respiratory (~20 pathogens) panel by PCR     Status: None   Collection Time: 06/14/23  1:27 AM   Specimen: Nasopharyngeal Swab; Respiratory  Result Value Ref Range Status   Adenovirus NOT DETECTED NOT DETECTED Final   Coronavirus 229E NOT DETECTED NOT DETECTED Final    Comment: (NOTE) The Coronavirus on the Respiratory Panel, DOES NOT test for the novel  Coronavirus (2019 nCoV)    Coronavirus HKU1 NOT DETECTED NOT DETECTED Final   Coronavirus NL63 NOT DETECTED NOT DETECTED Final   Coronavirus OC43 NOT DETECTED NOT DETECTED Final   Metapneumovirus NOT DETECTED NOT DETECTED Final   Rhinovirus / Enterovirus NOT DETECTED NOT DETECTED Final   Influenza A NOT DETECTED NOT DETECTED Final   Influenza B NOT DETECTED NOT DETECTED Final   Parainfluenza Virus 1 NOT DETECTED NOT DETECTED Final   Parainfluenza Virus 2 NOT DETECTED NOT DETECTED Final   Parainfluenza Virus 3 NOT DETECTED NOT DETECTED Final   Parainfluenza Virus 4 NOT DETECTED NOT DETECTED Final   Respiratory Syncytial Virus NOT DETECTED NOT DETECTED Final   Bordetella pertussis NOT DETECTED NOT DETECTED Final   Bordetella Parapertussis NOT DETECTED NOT DETECTED Final   Chlamydophila pneumoniae  NOT DETECTED NOT DETECTED Final   Mycoplasma pneumoniae NOT DETECTED NOT DETECTED Final    Comment: Performed at [facility], [address] [place], [place] [address]     Time coordinating discharge: Over 30 minutes  SIGNED:   Camellia PARAS Louis Gaw, DO  [facility] 06/17/2023, 11:01 AM

## 2023-06-21 ENCOUNTER — Encounter: Payer: Self-pay | Admitting: Internal Medicine

## 2023-06-21 ENCOUNTER — Ambulatory Visit: Payer: 59 | Admitting: Internal Medicine

## 2023-06-21 ENCOUNTER — Ambulatory Visit: Payer: 59 | Attending: Internal Medicine | Admitting: Internal Medicine

## 2023-06-21 VITALS — BP 114/80 | HR 75 | Ht 61.0 in | Wt 139.2 lb

## 2023-06-21 DIAGNOSIS — Z136 Encounter for screening for cardiovascular disorders: Secondary | ICD-10-CM

## 2023-06-21 DIAGNOSIS — Z01818 Encounter for other preprocedural examination: Secondary | ICD-10-CM | POA: Diagnosis not present

## 2023-06-21 NOTE — Progress Notes (Signed)
  Cardiology Office Note:  .   Date:  06/21/2023  ID:  Amy Mckay, DOB 1935/04/26, MRN [id number] PCP: Karlene Mungo, MD  [facility] Providers Cardiologist:  None    History of Present Illness: .   Amy Mckay is a 89 y.o. female with history of COPD, chronic rectal prolapse with recent bleed, stroke, mesenteric ischemia status post SMA stent, hypothyroidism who is being referred to cardiology for a preoperative assessment for her rectal prolapse repair.  At the end of 2024 she was admitted to the hospital with respiratory distress. This was questionably related to an atypical pneumonia.  She was hypotensive and required levo. She was also noted to be anemic requiring 1 unit of PRBC.  There is no report of chest pain however troponins were drawn.  Her troponin levels were in the 200s and relatively flat.  Therefore cardiology was consulted.  She had an echocardiogram that showed mildly reduced EF, no specific wall motion abnormalities, EKG showed sinus tachycardia with poor R wave progression.  She was seen by cardiology and with question of GI bleed there was no recommendation for left heart cath.  In terms of her possible bleed she was given an IV PPI.  However there was no recommendation for an inpatient EGD.  She was instructed that she can continue her antiplatelets for history of SMA stent.   Today, she notes she has not walked up stairs in 5 months. She walks with a walker. Notes mild swelling in the legs. She denies PND. She sleeps with pillows.  She has no history of heart failure however BNP was elevated in the hospital.  She has been on oxygen for 3 years. She is a former smoker.   ROS:  per HPI otherwise negative   Studies Reviewed: .         Risk Assessment/Calculations:     Physical Exam:   VS:  Vitals:   06/21/23 1118 06/21/23 1156  BP: 114/80 114/80  Pulse: 75     Wt Readings from Last 3 Encounters:  06/17/23 135 lb 5.8 oz (61.4 kg)  04/26/23 132  lb (59.9 kg)  01/28/23 121 lb 4.1 oz (55 kg)    GEN: Well nourished, well developed in no acute distress NECK: No JVD; No carotid bruits CARDIAC: RRR, no murmurs, rubs, gallops RESPIRATORY:  Clear to auscultation without rales, wheezing or rhonchi  ABDOMEN: Soft, non-tender, non-distended EXTREMITIES:  No edema; No deformity   ASSESSMENT AND PLAN: .   Pre-Op for Rectal Prolapse She lives at an assisted living facility and has a walker. Has not done stairs in the past the month. I cannot assess her METS.  There was no strong suggestion of an infarct when she was in the hospital.  However, with her limited mobility will recommend a nuclear stress test prior to her surgery.  Otherwise she is euvolemic and her blood pressure is well-controlled.  Her ECG shows no ischemia.  She is also asymptomatic.     Dispo: Follow-up in 6 months with an APP  Signed, Graysen Depaula, Ronal BRAVO, MD

## 2023-06-21 NOTE — Patient Instructions (Addendum)
 Medication Instructions:  Your physician recommends that you continue on your current medications as directed. Please refer to the Current Medication list given to you today.  *If you need a refill on your cardiac medications before your next appointment, please call your pharmacy*  Lab Work: None  Testing/Procedures: Your physician has requested that you have a lexiscan  myoview . For further information please visit [email or web address] Please follow instruction sheet, as given. This will take place at [address], [address]  How to prepare for your Myocardial Perfusion Test: Do not eat or drink 3 hours prior to your test, except you may have water. Do not consume products containing caffeine (regular or decaffeinated) 12 hours prior to your test. (ex: coffee, chocolate, sodas, tea). Do bring a list of your current medications with you.  If not listed below, you may take your medications as normal. Do wear comfortable clothes (no dresses or overalls) and walking shoes, tennis shoes preferred (No heels or open toe shoes are allowed). Do NOT wear cologne, perfume, aftershave, or lotions (deodorant is allowed). The test will take approximately 3 to 4 hours to complete If these instructions are not followed, your test will have to be rescheduled.    Follow-Up: At [facility], you and your health needs are our priority.  As part of our continuing mission to provide you with exceptional heart care, we have created designated Provider Care Teams.  These Care Teams include your primary Cardiologist (physician) and Advanced Practice Providers (APPs -  Physician Assistants and Nurse Practitioners) who all work together to provide you with the care you need, when you need it.  We recommend signing up for the patient portal called MyChart.  Sign up information is provided on this After Visit Summary.  MyChart is used to connect with patients for Virtual Visits (Telemedicine).  Patients  are able to view lab/test results, encounter notes, upcoming appointments, etc.  Non-urgent messages can be sent to your provider as well.   To learn more about what you can do with MyChart, go to [email or web address].    Your next appointment:   6 month(s)  Provider:   APP Stoney Duke, PA-C, Callie Goodrich, PA-C, Hao Meng, PA-C, Damien Braver, NP, or Katlyn West, NP)

## 2023-07-01 ENCOUNTER — Encounter: Payer: Self-pay | Admitting: Emergency Medicine

## 2023-07-01 ENCOUNTER — Ambulatory Visit: Payer: 59 | Admitting: Emergency Medicine

## 2023-07-01 ENCOUNTER — Ambulatory Visit (HOSPITAL_COMMUNITY): Payer: 59

## 2023-07-01 VITALS — BP 76/54 | HR 54 | Ht 61.0 in | Wt 143.6 lb

## 2023-07-01 DIAGNOSIS — R918 Other nonspecific abnormal finding of lung field: Secondary | ICD-10-CM | POA: Diagnosis not present

## 2023-07-01 DIAGNOSIS — R9389 Abnormal findings on diagnostic imaging of other specified body structures: Secondary | ICD-10-CM

## 2023-07-01 DIAGNOSIS — K6289 Other specified diseases of anus and rectum: Secondary | ICD-10-CM | POA: Diagnosis not present

## 2023-07-01 DIAGNOSIS — J9611 Chronic respiratory failure with hypoxia: Secondary | ICD-10-CM

## 2023-07-01 DIAGNOSIS — J449 Chronic obstructive pulmonary disease, unspecified: Secondary | ICD-10-CM | POA: Diagnosis not present

## 2023-07-01 MED ORDER — BREZTRI AEROSPHERE 160-9-4.8 MCG/ACT IN AERO: 2.0000 | INHALATION_SPRAY | Freq: Two times a day (BID) | RESPIRATORY_TRACT | 11 refills | Status: AC

## 2023-07-01 NOTE — Assessment & Plan Note (Signed)
Continue 3L/min 

## 2023-07-01 NOTE — Assessment & Plan Note (Signed)
We will stop Breo Please start Breztri 2 puffs twice a day.  Rinse and gargle after using. Keep albuterol available to use 2 puffs up to every 4 hours if needed for shortness of breath, chest tightness, wheezing.

## 2023-07-01 NOTE — Assessment & Plan Note (Signed)
With rectal prolapse.  She does still have blood loss and certainly this may have contributed to symptomatic anemia and her shock and dyspnea on her recent admission.  Surgery is being considered.  I discussed risk stratification with her:  We discussed your preoperative surgical risk for possible repair of rectal prolapse.  You are at high risk for general anesthesia or surgery due to your age and underlying lung disease.  This does not preclude you from having surgery if the benefits outweigh these risks.

## 2023-07-01 NOTE — Patient Instructions (Signed)
We will stop Breo Please start Breztri 2 puffs twice a day.  Rinse and gargle after using. Keep albuterol available to use 2 puffs up to every 4 hours if needed for shortness of breath, chest tightness, wheezing.  Continue oxygen 3 L/min We will plan to repeat your CT scan of the chest in late March 2025 to compare with priors Continue to follow with your primary physician regarding your swallowing difficulty, rectal prolapse and bleeding. We discussed your preoperative surgical risk for possible repair of rectal prolapse.  You are at high risk for general anesthesia or surgery due to your age and underlying lung disease.  This does not preclude you from having surgery if the benefits outweigh these risks. Follow with Dr. Delton Coombes in 3 months to review your CT

## 2023-07-01 NOTE — Assessment & Plan Note (Signed)
Pulmonary nodular infiltrates noted on the recent admission for pneumonitis/pneumonia.  Did advise her that we need to make sure she has follow-up for her dysphagia and swallowing precautions.  We will repeat her CT chest in late March to look for stability, resolution.  I will follow-up with her after

## 2023-07-01 NOTE — Progress Notes (Signed)
Subjective:    Patient ID: Amy Mckay, female    DOB: 1934/09/30, 89 y.o.   MRN: [id number]  HPI 89 year old woman with a history of former tobacco use (70+ pack years) who carries a diagnosis of COPD with associated chronic hypoxemic respiratory failure (3 L/min at home).  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 She was admitted to the hospital in late December with hypoxemia, dyspnea and poor air movement.  Also rectal bleeding from prolapse. She required transient BiPAP and had bilateral groundglass infiltrates on chest imaging consistent with either pulmonary edema or an atypical infectious process.  She was treated with levofloxacin.  Also noted was a new 9 mm left upper lobe nodular opacity that will need to be followed.  In the emergency department she has some transient hypotension, hemoglobin 6.3 requiring transfusion.  She was on the critical care service for short period of time.  She improved with antibiotics, volume resuscitation and blood. She presents today noting that she is not back to her baseline energy. She is still having rectal bleeding. Rare cough. She does have some dysphagia and a lot of reflux, occasional emesis. She is not using albuterol. He O2 is at 3L/min.    Review of Systems As per HPI  Past Medical History:  Diagnosis Date   Acid reflux    COPD (chronic obstructive pulmonary disease) (HCC)    Hypertension      No family history on file.   Social History   Socioeconomic History   Marital status: Single    Spouse name: Not on file   Number of children: Not on file   Years of education: Not on file   Highest education level: Not on file  Occupational History   Not on file  Tobacco Use   Smoking status: Every Day    Current packs/day: 1.50    Types: Cigarettes   Smokeless tobacco: Never  Vaping Use   Vaping status:  Never Used  Substance and Sexual Activity   Alcohol use: No   Drug use: No   Sexual activity: Not on file  Other Topics Concern   Not on file  Social History Narrative   Not on file   Social Drivers of Health   Financial Resource Strain: Not on file  Food Insecurity: No Food Insecurity (06/14/2023)   Hunger Vital Sign    Worried About Running Out of Food in the Last Year: Never true    Ran Out of Food in the Last Year: Never true  Transportation Needs: No Transportation Needs (06/14/2023)   PRAPARE - Administrator, Civil Service (Medical): No    Lack of Transportation (Non-Medical): No  Physical Activity: Not on file  Stress: Not on file  Social Connections: Patient Declined (06/14/2023)   Social Connection and Isolation Panel [NHANES]    Frequency of Communication with Friends and Family: Patient declined    Frequency of Social Gatherings with Friends and Family: Patient declined    Attends Religious Services: Patient declined    Database administrator or Organizations: Patient declined    Attends Banker Meetings: Patient declined    Marital Status: Patient declined  Intimate Partner Violence: Not At Risk (06/14/2023)   Humiliation, Afraid, Rape, and Kick questionnaire    Fear of Current or Ex-Partner: No    Emotionally Abused: No    Physically Abused: No  Sexually Abused: No     Allergies  Allergen Reactions   Penicillins Anaphylaxis, Swelling and Other (See Comments)   Penicillin G Hives     Outpatient Medications Prior to Visit  Medication Sig Dispense Refill   acetaminophen (TYLENOL) 500 MG tablet Take 1,000 mg by mouth in the morning, at noon, and at bedtime.     Artificial Saliva (BIOTENE DRY MOUTH MOISTURIZING) SOLN Use as directed 1 Capful in the mouth or throat in the morning and at bedtime. Swish and spit     aspirin EC 81 MG tablet Take 81 mg by mouth daily. Swallow whole.     atorvastatin (LIPITOR) 80 MG tablet Take 1 tablet  (80 mg total) by mouth daily.     Calcium Carb-Cholecalciferol (CALCIUM + VITAMIN D3 PO) Take 1 tablet by mouth daily.     DULoxetine (CYMBALTA) 60 MG capsule Take 60 mg by mouth daily.     ferrous gluconate (FERGON) 324 MG tablet Take 1 tablet (324 mg total) by mouth daily with breakfast.     fluticasone (FLONASE) 50 MCG/ACT nasal spray Place 1 spray into both nostrils daily.     hydrocortisone (ANUSOL-HC) 2.5 % rectal cream Place rectally 4 (four) times daily as needed for hemorrhoids or anal itching.     Ipratropium-Albuterol (COMBIVENT) 20-100 MCG/ACT AERS respimat Inhale 1 puff into the lungs 2 (two) times daily.     levothyroxine (SYNTHROID, LEVOTHROID) 75 MCG tablet Take 75 mcg by mouth daily before breakfast.     memantine (NAMENDA) 10 MG tablet Take 10 mg by mouth 2 (two) times daily.     metoprolol succinate (TOPROL-XL) 25 MG 24 hr tablet Take 0.5 tablets (12.5 mg total) by mouth daily.     Multiple Vitamin (MULTIVITAMIN WITH MINERALS) TABS tablet Take 1 tablet by mouth daily with breakfast.     ondansetron (ZOFRAN-ODT) 4 MG disintegrating tablet Take 4 mg by mouth every 8 (eight) hours as needed.     oxyCODONE (OXY IR/ROXICODONE) 5 MG immediate release tablet Take 1-2 tablets (5-10 mg total) by mouth every 4 (four) hours as needed for moderate pain (pain score 4-6). 30 tablet 0   pantoprazole (PROTONIX) 40 MG tablet Take 1 tablet (40 mg total) by mouth 2 (two) times daily. 60 tablet 11   phenylephrine-shark liver oil-mineral oil-petrolatum (PREPARATION H) 0.25-14-74.9 % rectal ointment Place 1 Application rectally every 12 (twelve) hours.     polyethylene glycol (MIRALAX / GLYCOLAX) 17 g packet Take 17 g by mouth daily.     pramipexole (MIRAPEX) 0.25 MG tablet Take 1 tablet (0.25 mg total) by mouth 2 (two) times daily. (Patient taking differently: Take 0.25 mg by mouth 3 (three) times daily.)     PSYLLIUM HUSK PO Take 0.4 g by mouth in the morning and at bedtime.     sacubitril-valsartan  (ENTRESTO) 24-26 MG Take 1 tablet by mouth 2 (two) times daily.     senna-docusate (SENOKOT-S) 8.6-50 MG tablet Take 1 tablet by mouth 2 (two) times daily.     sodium chloride (OCEAN) 0.65 % SOLN nasal spray Place 1 spray into both nostrils in the morning, at noon, and at bedtime.     sucralfate (CARAFATE) 1 g tablet Take 1 tablet (1 g total) by mouth 4 (four) times daily -  with meals and at bedtime.     ticagrelor (BRILINTA) 90 MG TABS tablet Take 1 tablet (90 mg total) by mouth 2 (two) times daily. 60 tablet    vitamin C (  ASCORBIC ACID) 500 MG tablet Take 500 mg by mouth daily.     BREO ELLIPTA 200-25 MCG/ACT AEPB Inhale 1 puff into the lungs daily.     albuterol (VENTOLIN HFA) 108 (90 Base) MCG/ACT inhaler Inhale 2 puffs into the lungs every 6 (six) hours as needed for wheezing or shortness of breath. (Patient not taking: Reported on 07/01/2023)     No facility-administered medications prior to visit.        Objective:   Physical Exam Vitals:   07/01/23 0839  BP: (!) 76/54  Pulse: (!) 54  SpO2: 96%  Weight: 143 lb 9.6 oz (65.1 kg)  Height: 5\' 1"  (1.549 m)    Gen: Pleasant, elderly woman, in no distress,  normal affect  ENT: No lesions,  mouth clear,  oropharynx clear, no postnasal drip  Neck: No JVD, no stridor  Lungs: No use of accessory muscles, no crackles or wheezing on normal respiration, no wheeze on forced expiration  Cardiovascular: RRR, heart sounds normal, no murmur or gallops, no peripheral edema  Musculoskeletal: No deformities, no cyanosis or clubbing  Neuro: alert, awake, non focal  Skin: Warm, no lesions or rash       Assessment & Plan:   COPD (chronic obstructive pulmonary disease) (HCC) We will stop Breo Please start Breztri 2 puffs twice a day.  Rinse and gargle after using. Keep albuterol available to use 2 puffs up to every 4 hours if needed for shortness of breath, chest tightness, wheezing.   Pulmonary nodules/lesions, multiple Pulmonary  nodular infiltrates noted on the recent admission for pneumonitis/pneumonia.  Did advise her that we need to make sure she has follow-up for her dysphagia and swallowing precautions.  We will repeat her CT chest in late March to look for stability, resolution.  I will follow-up with her after  Chronic respiratory failure with hypoxia (HCC) Continue 3 L/min  Proctitis With rectal prolapse.  She does still have blood loss and certainly this may have contributed to symptomatic anemia and her shock and dyspnea on her recent admission.  Surgery is being considered.  I discussed risk stratification with her:  We discussed your preoperative surgical risk for possible repair of rectal prolapse.  You are at high risk for general anesthesia or surgery due to your age and underlying lung disease.  This does not preclude you from having surgery if the benefits outweigh these risks.   Levy Pupa, MD, PhD 07/01/2023, 9:15 AM [facility] [phone] or if no answer before 7:00PM call [phone] For any issues after 7:00PM please call eLink [phone]

## 2023-07-06 ENCOUNTER — Ambulatory Visit (HOSPITAL_COMMUNITY): Payer: 59 | Attending: Internal Medicine

## 2023-07-06 ENCOUNTER — Ambulatory Visit (HOSPITAL_COMMUNITY): Payer: 59

## 2023-07-06 DIAGNOSIS — Z01810 Encounter for preprocedural cardiovascular examination: Secondary | ICD-10-CM | POA: Diagnosis present

## 2023-07-06 DIAGNOSIS — Z01818 Encounter for other preprocedural examination: Secondary | ICD-10-CM

## 2023-07-06 LAB — MYOCARDIAL PERFUSION IMAGING
Base ST Depression (mm): 0 mm
LV dias vol: 50 mL (ref 46–106)
LV sys vol: 23 mL
Nuc Stress EF: 55 %
Peak HR: 101 {beats}/min
Rest HR: 82 {beats}/min
Rest Nuclear Isotope Dose: 10.7 mCi
SDS: 0
SRS: 0
SSS: 0
ST Depression (mm): 0 mm
Stress Nuclear Isotope Dose: 31.2 mCi
TID: 1

## 2023-07-06 MED ORDER — REGADENOSON 0.4 MG/5ML IV SOLN
0.4000 mg | Freq: Once | INTRAVENOUS | Status: AC
  Administered 2023-07-06: 0.4 mg via INTRAVENOUS

## 2023-07-06 MED ORDER — TECHNETIUM TC 99M TETROFOSMIN IV KIT
31.2000 | PACK | Freq: Once | INTRAVENOUS | Status: AC | PRN
  Administered 2023-07-06: 31.2 via INTRAVENOUS

## 2023-07-06 MED ORDER — TECHNETIUM TC 99M TETROFOSMIN IV KIT
10.7000 | PACK | Freq: Once | INTRAVENOUS | Status: AC | PRN
  Administered 2023-07-06: 10.7 via INTRAVENOUS

## 2023-08-17 ENCOUNTER — Other Ambulatory Visit: Payer: Self-pay | Admitting: Gastroenterology

## 2023-09-01 ENCOUNTER — Telehealth (INDEPENDENT_AMBULATORY_CARE_PROVIDER_SITE_OTHER): Payer: Self-pay | Admitting: Physician Assistant

## 2023-09-01 NOTE — Telephone Encounter (Signed)
 Left vm to confirm appt date and location for 09/02/2023.

## 2023-09-02 ENCOUNTER — Encounter (INDEPENDENT_AMBULATORY_CARE_PROVIDER_SITE_OTHER): Payer: Self-pay | Admitting: Physician Assistant

## 2023-09-02 ENCOUNTER — Ambulatory Visit (INDEPENDENT_AMBULATORY_CARE_PROVIDER_SITE_OTHER): Admitting: Physician Assistant

## 2023-09-02 DIAGNOSIS — H9193 Unspecified hearing loss, bilateral: Secondary | ICD-10-CM | POA: Diagnosis not present

## 2023-09-02 DIAGNOSIS — H6123 Impacted cerumen, bilateral: Secondary | ICD-10-CM

## 2023-09-02 DIAGNOSIS — H6121 Impacted cerumen, right ear: Secondary | ICD-10-CM

## 2023-09-02 NOTE — Progress Notes (Signed)
 Dear Dr. Cresenciano Lick, Here is my assessment for our mutual patient, Amy Mckay. Thank you for allowing me the opportunity to care for your patient. Please do not hesitate to contact me should you have any other questions. Sincerely, Burna Forts PA-C  Otolaryngology Clinic Note Referring provider: Dr. Cresenciano Lick HPI:  Amy Mckay is a 89 y.o. female kindly referred by Dr. Cresenciano Lick   The patient is an 89 year old female seen today for cerumen impaction.  The patient notes that she has cerumen in her right ear and her primary care provider was unable to remove it.  She notes that she wears hearing aids and needs to have [facility] but given the cerumen is unable to complete this.  She notes at baseline her right ear as worse hearing compared to the left.  She denies any significant pain to the ear.  No history of head or neck surgeries, no trauma.  No ringing.   Patient also notes she has had issues with swallowing this is ongoing and chronic.  She notes she has had this worked up previously, but admits to history of dementia and is uncertain what the final result was.   Independent Review of Additional Tests or Records:  none   PMH/Meds/All/SocHx/FamHx/ROS:   Past Medical History:  Diagnosis Date   Acid reflux    COPD (chronic obstructive pulmonary disease) (HCC)    Hypertension      Past Surgical History:  Procedure Laterality Date   BIOPSY  10/17/2021   Procedure: BIOPSY;  Surgeon: Charlott Rakes, MD;  Location: [facility];  Service: Gastroenterology;;   BIOPSY  01/29/2023   Procedure: BIOPSY;  Surgeon: Charlott Rakes, MD;  Location: [facility];  Service: Gastroenterology;;   COLONOSCOPY N/A 10/17/2021   Procedure: COLONOSCOPY;  Surgeon: Charlott Rakes, MD;  Location: [facility];  Service: Gastroenterology;  Laterality: N/A;   COLONOSCOPY WITH PROPOFOL N/A 10/18/2021   Procedure: COLONOSCOPY WITH PROPOFOL;  Surgeon: Kathi Der, MD;   Location: [facility];  Service: Gastroenterology;  Laterality: N/A;   ESOPHAGOGASTRODUODENOSCOPY N/A 10/17/2021   Procedure: ESOPHAGOGASTRODUODENOSCOPY (EGD);  Surgeon: Charlott Rakes, MD;  Location: [facility];  Service: Gastroenterology;  Laterality: N/A;   FLEXIBLE SIGMOIDOSCOPY N/A 01/29/2023   Procedure: FLEXIBLE SIGMOIDOSCOPY;  Surgeon: Charlott Rakes, MD;  Location: [facility];  Service: Gastroenterology;  Laterality: N/A;   POLYPECTOMY  10/18/2021   Procedure: POLYPECTOMY;  Surgeon: Kathi Der, MD;  Location: [facility];  Service: Gastroenterology;;    No family history on file.   Social Connections: Patient Declined (06/14/2023)   Social Connection and Isolation Panel [NHANES]    Frequency of Communication with Friends and Family: Patient declined    Frequency of Social Gatherings with Friends and Family: Patient declined    Attends Religious Services: Patient declined    Database administrator or Organizations: Patient declined    Attends Banker Meetings: Patient declined    Marital Status: Patient declined      Current Outpatient Medications:    acetaminophen (TYLENOL) 500 MG tablet, Take 1,000 mg by mouth in the morning, at noon, and at bedtime., Disp: , Rfl:    albuterol (VENTOLIN HFA) 108 (90 Base) MCG/ACT inhaler, Inhale 2 puffs into the lungs every 6 (six) hours as needed for wheezing or shortness of breath. (Patient not taking: Reported on 07/01/2023), Disp: , Rfl:    Artificial Saliva (BIOTENE DRY MOUTH MOISTURIZING) SOLN, Use as directed 1 Capful in the mouth or throat in the morning and at  bedtime. Swish and spit, Disp: , Rfl:    aspirin EC 81 MG tablet, Take 81 mg by mouth daily. Swallow whole., Disp: , Rfl:    atorvastatin (LIPITOR) 80 MG tablet, Take 1 tablet (80 mg total) by mouth daily., Disp: , Rfl:    Budeson-Glycopyrrol-Formoterol (BREZTRI AEROSPHERE) 160-9-4.8 MCG/ACT AERO, Inhale 2 puffs into the lungs in the morning and at  bedtime., Disp: 10.7 g, Rfl: 11   Calcium Carb-Cholecalciferol (CALCIUM + VITAMIN D3 PO), Take 1 tablet by mouth daily., Disp: , Rfl:    DULoxetine (CYMBALTA) 60 MG capsule, Take 60 mg by mouth daily., Disp: , Rfl:    ferrous gluconate (FERGON) 324 MG tablet, Take 1 tablet (324 mg total) by mouth daily with breakfast., Disp: , Rfl:    fluticasone (FLONASE) 50 MCG/ACT nasal spray, Place 1 spray into both nostrils daily., Disp: , Rfl:    hydrocortisone (ANUSOL-HC) 2.5 % rectal cream, Place rectally 4 (four) times daily as needed for hemorrhoids or anal itching., Disp: , Rfl:    Ipratropium-Albuterol (COMBIVENT) 20-100 MCG/ACT AERS respimat, Inhale 1 puff into the lungs 2 (two) times daily., Disp: , Rfl:    levothyroxine (SYNTHROID, LEVOTHROID) 75 MCG tablet, Take 75 mcg by mouth daily before breakfast., Disp: , Rfl:    memantine (NAMENDA) 10 MG tablet, Take 10 mg by mouth 2 (two) times daily., Disp: , Rfl:    metoprolol succinate (TOPROL-XL) 25 MG 24 hr tablet, Take 0.5 tablets (12.5 mg total) by mouth daily., Disp: , Rfl:    Multiple Vitamin (MULTIVITAMIN WITH MINERALS) TABS tablet, Take 1 tablet by mouth daily with breakfast., Disp: , Rfl:    ondansetron (ZOFRAN-ODT) 4 MG disintegrating tablet, Take 4 mg by mouth every 8 (eight) hours as needed., Disp: , Rfl:    oxyCODONE (OXY IR/ROXICODONE) 5 MG immediate release tablet, Take 1-2 tablets (5-10 mg total) by mouth every 4 (four) hours as needed for moderate pain (pain score 4-6)., Disp: 30 tablet, Rfl: 0   pantoprazole (PROTONIX) 40 MG tablet, Take 1 tablet (40 mg total) by mouth 2 (two) times daily., Disp: 60 tablet, Rfl: 11   phenylephrine-shark liver oil-mineral oil-petrolatum (PREPARATION H) 0.25-14-74.9 % rectal ointment, Place 1 Application rectally every 12 (twelve) hours., Disp: , Rfl:    polyethylene glycol (MIRALAX / GLYCOLAX) 17 g packet, Take 17 g by mouth daily., Disp: , Rfl:    pramipexole (MIRAPEX) 0.25 MG tablet, Take 1 tablet (0.25 mg  total) by mouth 2 (two) times daily. (Patient taking differently: Take 0.25 mg by mouth 3 (three) times daily.), Disp: , Rfl:    PSYLLIUM HUSK PO, Take 0.4 g by mouth in the morning and at bedtime., Disp: , Rfl:    sacubitril-valsartan (ENTRESTO) 24-26 MG, Take 1 tablet by mouth 2 (two) times daily., Disp: , Rfl:    senna-docusate (SENOKOT-S) 8.6-50 MG tablet, Take 1 tablet by mouth 2 (two) times daily., Disp: , Rfl:    sodium chloride (OCEAN) 0.65 % SOLN nasal spray, Place 1 spray into both nostrils in the morning, at noon, and at bedtime., Disp: , Rfl:    sucralfate (CARAFATE) 1 g tablet, Take 1 tablet (1 g total) by mouth 4 (four) times daily -  with meals and at bedtime., Disp: , Rfl:    ticagrelor (BRILINTA) 90 MG TABS tablet, Take 1 tablet (90 mg total) by mouth 2 (two) times daily., Disp: 60 tablet, Rfl:    vitamin C (ASCORBIC ACID) 500 MG tablet, Take 500 mg by mouth  daily., Disp: , Rfl:    Physical Exam:   There were no vitals taken for this visit.  Pertinent Findings  CN II-XII intact Right external auditory canal with cerumen impaction, left with no impaction, TM intact with well pneumatized middle ear Weber 512: equal Rinne 512: AC > BC b/l  Anterior rhinoscopy: Septum midline; bilateral inferior turbinates with no hypertrophy No lesions of oral cavity/oropharynx; dentition upper dentures, numerous missing teeth on the bottom No obviously palpable neck masses/lymphadenopathy/thyromegaly No respiratory distress or stridor  Seprately Identifiable Procedures:  Procedure: Bilateral ear microscopy and cerumen removal using microscope (CPT 69210) - Mod 25 Pre-procedure diagnosis:Cerumen impaction right external ears Post-procedure diagnosis: same Indication: cerumen impaction; given patient's otologic complaints and history as well as for improved and comprehensive examination of external ear and tympanic membrane, bilateral otologic examination using microscope was performed and  impacted cerumen removed  Procedure: Patient was placed semi-recumbent. Both ear canals were examined using the microscope with findings above. Cerumen removed on left and on right using suction and currette with improvement in EAC examination and patency. Left: EAC was patent. TM was intact . Middle ear was aerated. Drainage: none Right: EAC was patent. TM was intact. Middle ear was aerated. Drainage: none Patient tolerated the procedure well.      Impression & Plans:  Maleiah Dula is a 89 y.o. female with the following   Cerumen impaction-  Right-sided cerumen impaction removed without difficulty.  TM intact.   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  She notes that she has been instructed to return to [facility] for [facility] and [facility].  I offered to have her audiology performed here but given that they have been managing her hearing aids she would like to return to their office, I find this reasonable.  I like her to follow-up in our office once she has completed audiogram given the reported asymmetric nature of her hearing loss.  Difficulty swallowing-  Uncertain etiology at this time, we discussed that today's visit would be focused on her cerumen and hearing loss and that when she followed up in our office we would continue talking about her difficulty swallowing.  Sounds like this is chronic, she reports it has been worked up previously.   - f/u after completion of audiogram   Thank you for allowing me the opportunity to care for your patient. Please do not hesitate to contact me should you have any other questions.  Sincerely, Burna Forts PA-C  [facility] Phone: [phone] Fax: [phone]  09/02/2023, 2:46 PM

## 2023-09-07 ENCOUNTER — Ambulatory Visit
Admission: RE | Admit: 2023-09-07 | Discharge: 2023-09-07 | Disposition: A | Discharge status: Home / Self Care | Payer: 59 | Source: Ambulatory Visit | Attending: Emergency Medicine | Admitting: Emergency Medicine

## 2023-09-07 DIAGNOSIS — R9389 Abnormal findings on diagnostic imaging of other specified body structures: Secondary | ICD-10-CM

## 2023-10-07 ENCOUNTER — Encounter: Payer: Self-pay | Admitting: Emergency Medicine

## 2023-10-07 ENCOUNTER — Ambulatory Visit: Payer: 59 | Admitting: Emergency Medicine

## 2023-10-07 VITALS — BP 91/62 | HR 77 | Ht 61.0 in | Wt 150.0 lb

## 2023-10-07 DIAGNOSIS — R1319 Other dysphagia: Secondary | ICD-10-CM

## 2023-10-07 DIAGNOSIS — Z87891 Personal history of nicotine dependence: Secondary | ICD-10-CM

## 2023-10-07 DIAGNOSIS — K6289 Other specified diseases of anus and rectum: Secondary | ICD-10-CM | POA: Diagnosis not present

## 2023-10-07 DIAGNOSIS — J9611 Chronic respiratory failure with hypoxia: Secondary | ICD-10-CM | POA: Diagnosis not present

## 2023-10-07 DIAGNOSIS — J449 Chronic obstructive pulmonary disease, unspecified: Secondary | ICD-10-CM

## 2023-10-07 DIAGNOSIS — R918 Other nonspecific abnormal finding of lung field: Secondary | ICD-10-CM | POA: Diagnosis not present

## 2023-10-07 NOTE — Progress Notes (Signed)
 Subjective:    Patient ID: Amy Mckay, female    DOB: 15-Jul-1934, 89 y.o.   MRN: [id number]  HPI  ROV 10/07/2023 --follow-up visit for 89 year old woman with history of COPD and chronic hypoxemic respiratory failure, chronic mesenteric vascular ischemia, CVA, hypothyroidism, chronic dysphagia and esophageal dysmotility.  I saw her after an admission for pneumonia/pneumonitis, question related to aspiration.  CT at that time showed bilateral groundglass infiltrates and a new 9 mm left upper lobe pulmonary nodule.  Repeat CT done 09/07/2023 as below.  At that visit I tried changing her Breo to Breztri  to see if she would get more benefit. She has not yet had rectal surgery - we did characterize her as high risk for general anesthesia. She tolerated the Breztri , thinks it may have helped some but she remains SOB w any exertion, even w her O2 in place. She is using albuterol  1-2x a day. Still having dysphagia, choking with meals and pills.   CT scan of the chest 09/07/2023 reviewed by me, shows moderate centrilobular emphysema dependent lingular and lower lobe atelectasis 3 mm right upper lobe nodule (new), scattered smaller nodules in the right upper lobe, unchanged 7 mm anterior right lower lobe nodule.  Some of her ill-defined left midlung nodular opacities have resolved.  Some of the nodules are calcified   Review of Systems As per HPI  Past Medical History:  Diagnosis Date   Acid reflux    COPD (chronic obstructive pulmonary disease) (HCC)    Hypertension      No family history on file.   Social History   Socioeconomic History   Marital status: Single    Spouse name: Not on file   Number of children: Not on file   Years of education: Not on file   Highest education level: Not on file  Occupational History   Not on file  Tobacco Use   Smoking status: Former    Current packs/day: 1.50    Types: Cigarettes   Smokeless tobacco: Never  Vaping Use   Vaping status: Never Used   Substance and Sexual Activity   Alcohol use: No   Drug use: No   Sexual activity: Not on file  Other Topics Concern   Not on file  Social History Narrative   Not on file   Social Drivers of Health   Financial Resource Strain: Not on file  Food Insecurity: No Food Insecurity (06/14/2023)   Hunger Vital Sign    Worried About Running Out of Food in the Last Year: Never true    Ran Out of Food in the Last Year: Never true  Transportation Needs: No Transportation Needs (06/14/2023)   PRAPARE - Administrator, Civil Service (Medical): No    Lack of Transportation (Non-Medical): No  Physical Activity: Not on file  Stress: Not on file  Social Connections: Patient Declined (06/14/2023)   Social Connection and Isolation Panel [NHANES]    Frequency of Communication with Friends and Family: Patient declined    Frequency of Social Gatherings with Friends and Family: Patient declined    Attends Religious Services: Patient declined    Database administrator or Organizations: Patient declined    Attends Banker Meetings: Patient declined    Marital Status: Patient declined  Intimate Partner Violence: Not At Risk (06/14/2023)   Humiliation, Afraid, Rape, and Kick questionnaire    Fear of Current or Ex-Partner: No    Emotionally Abused: No    Physically  Abused: No    Sexually Abused: No     Allergies  Allergen Reactions   Penicillins Anaphylaxis, Swelling and Other (See Comments)   Penicillin G Hives     Outpatient Medications Prior to Visit  Medication Sig Dispense Refill   acetaminophen  (TYLENOL ) 500 MG tablet Take 1,000 mg by mouth in the morning, at noon, and at bedtime.     Artificial Saliva (BIOTENE DRY MOUTH MOISTURIZING) SOLN Use as directed 1 Capful in the mouth or throat in the morning and at bedtime. Swish and spit     aspirin  EC 81 MG tablet Take 81 mg by mouth daily. Swallow whole.     atorvastatin  (LIPITOR ) 80 MG tablet Take 1 tablet (80 mg  total) by mouth daily.     Budeson-Glycopyrrol-Formoterol  (BREZTRI  AEROSPHERE) 160-9-4.8 MCG/ACT AERO Inhale 2 puffs into the lungs in the morning and at bedtime. 10.7 g 11   Calcium  Carb-Cholecalciferol  (CALCIUM  + VITAMIN D3 PO) Take 1 tablet by mouth daily.     DULoxetine  (CYMBALTA ) 60 MG capsule Take 60 mg by mouth daily.     ferrous gluconate  (FERGON) 324 MG tablet Take 1 tablet (324 mg total) by mouth daily with breakfast.     fluticasone  (FLONASE) 50 MCG/ACT nasal spray Place 1 spray into both nostrils daily.     gabapentin  (NEURONTIN ) 300 MG capsule Take 300 mg by mouth 2 (two) times daily.     hydrocortisone  (ANUSOL -HC) 2.5 % rectal cream Place rectally 4 (four) times daily as needed for hemorrhoids or anal itching.     Ipratropium-Albuterol  (COMBIVENT) 20-100 MCG/ACT AERS respimat Inhale 1 puff into the lungs 2 (two) times daily.     levothyroxine  (SYNTHROID , LEVOTHROID) 75 MCG tablet Take 75 mcg by mouth daily before breakfast.     lidocaine  (XYLOCAINE ) 2 % injection 10 mLs by Infiltration route once.     memantine  (NAMENDA ) 10 MG tablet Take 10 mg by mouth 2 (two) times daily.     metoprolol  succinate (TOPROL -XL) 25 MG 24 hr tablet Take 0.5 tablets (12.5 mg total) by mouth daily.     Multiple Vitamin (MULTIVITAMIN WITH MINERALS) TABS tablet Take 1 tablet by mouth daily with breakfast.     ondansetron  (ZOFRAN -ODT) 4 MG disintegrating tablet Take 4 mg by mouth every 8 (eight) hours as needed.     oxyCODONE  (OXY IR/ROXICODONE ) 5 MG immediate release tablet Take 1-2 tablets (5-10 mg total) by mouth every 4 (four) hours as needed for moderate pain (pain score 4-6). 30 tablet 0   pantoprazole  (PROTONIX ) 40 MG tablet Take 1 tablet (40 mg total) by mouth 2 (two) times daily. 60 tablet 11   phenylephrine -shark liver oil-mineral oil-petrolatum (PREPARATION H) 0.25-14-74.9 % rectal ointment Place 1 Application rectally every 12 (twelve) hours.     polyethylene glycol (MIRALAX  / GLYCOLAX ) 17 g  packet Take 17 g by mouth daily.     pramipexole  (MIRAPEX ) 0.25 MG tablet Take 1 tablet (0.25 mg total) by mouth 2 (two) times daily. (Patient taking differently: Take 0.25 mg by mouth 3 (three) times daily.)     PSYLLIUM HUSK PO Take 0.4 g by mouth in the morning and at bedtime.     sacubitril -valsartan  (ENTRESTO ) 24-26 MG Take 1 tablet by mouth 2 (two) times daily.     senna-docusate (SENOKOT-S) 8.6-50 MG tablet Take 1 tablet by mouth 2 (two) times daily.     sodium chloride  (OCEAN) 0.65 % SOLN nasal spray Place 1 spray into both nostrils in the morning, at  noon, and at bedtime.     sucralfate  (CARAFATE ) 1 g tablet Take 1 tablet (1 g total) by mouth 4 (four) times daily -  with meals and at bedtime.     ticagrelor  (BRILINTA ) 90 MG TABS tablet Take 1 tablet (90 mg total) by mouth 2 (two) times daily. 60 tablet    vitamin C (ASCORBIC ACID ) 500 MG tablet Take 500 mg by mouth daily.     albuterol  (VENTOLIN  HFA) 108 (90 Base) MCG/ACT inhaler Inhale 2 puffs into the lungs every 6 (six) hours as needed for wheezing or shortness of breath. (Patient not taking: Reported on 10/07/2023)     No facility-administered medications prior to visit.        Objective:   Physical Exam Vitals:   10/07/23 0825  BP: 91/62  Pulse: 77  SpO2: 99%  Weight: 150 lb (68 kg)  Height: 5\' 1"  (1.549 m)    Gen: Pleasant, elderly woman in a wheelchair, in no distress,  normal affect  ENT: No lesions,  mouth clear,  oropharynx clear, no postnasal drip  Neck: No JVD, no stridor  Lungs: No use of accessory muscles, no crackles or wheezing on normal respiration, no wheeze on forced expiration  Cardiovascular: RRR, heart sounds normal, no murmur or gallops, trace peripheral edema  Musculoskeletal: No deformities, no cyanosis or clubbing  Neuro: alert, awake, non focal  Skin: Warm, no lesions or rash       Assessment & Plan:   Pulmonary nodules/lesions, multiple Infiltrates from her hospitalization somewhat  improved, along with some interval resolution of some nodular foci bilaterally.  She does still have scattered pulmonary nodules some calcified, largest 7 mm in the anterior right lower lobe.  We will continue to follow for serial stability, next scan to be done in March 2026.  COPD (chronic obstructive pulmonary disease) (HCC) She has exertional dyspnea with almost any activity.  May have benefited some from the change to Breztri  from [facility].  Plan to continue this.  Chronic respiratory failure with hypoxia (HCC) Continue oxygen at 3 L/min at all times  Proctitis Surgical risk discussed and defined.  She is high risk due to age and underlying COPD but there is no absolute contraindication to surgery as long as she is not having flaring symptoms.  Esophageal dysphagia Planning to follow with both GI and ENT   Racheal Buddle, MD, PhD 10/07/2023, 8:51 AM [facility] [phone] or if no answer before 7:00PM call [phone] For any issues after 7:00PM please call eLink [phone]

## 2023-10-07 NOTE — Assessment & Plan Note (Signed)
 Infiltrates from her hospitalization somewhat improved, along with some interval resolution of some nodular foci bilaterally.  She does still have scattered pulmonary nodules some calcified, largest 7 mm in the anterior right lower lobe.  We will continue to follow for serial stability, next scan to be done in March 2026.

## 2023-10-07 NOTE — Assessment & Plan Note (Signed)
 Surgical risk discussed and defined.  She is high risk due to age and underlying COPD but there is no absolute contraindication to surgery as long as she is not having flaring symptoms.

## 2023-10-07 NOTE — Assessment & Plan Note (Signed)
Continue oxygen at 3 L/min at all times 

## 2023-10-07 NOTE — Patient Instructions (Signed)
 We reviewed your CT scan of the chest today. We will plan to repeat your CT chest in March 2026 to follow small pulmonary nodules. Please continue Breztri  2 puffs twice a day.  Rinse and gargle after using. Keep your albuterol  available use 2 puffs when needed for shortness of breath, chest tightness, wheezing. Continue your oxygen at 3 L/min at all times We reviewed your operative risk today.  You are at high risk for general anesthesia due to your age and also your underlying lung disease.  That said there is nothing to preclude you from having surgery if the benefits outweigh these risks. Follow Dr. Baldwin Levee in March 2026 to review your CT scan.  Please call sooner if you have any problems.

## 2023-10-07 NOTE — Assessment & Plan Note (Signed)
 She has exertional dyspnea with almost any activity.  May have benefited some from the change to Breztri  from [facility].  Plan to continue this.

## 2023-10-07 NOTE — Assessment & Plan Note (Signed)
 Planning to follow with both GI and ENT

## 2023-10-15 ENCOUNTER — Ambulatory Visit (INDEPENDENT_AMBULATORY_CARE_PROVIDER_SITE_OTHER)

## 2023-10-15 ENCOUNTER — Ambulatory Visit
Admission: RE | Admit: 2023-10-15 | Discharge: 2023-10-15 | Disposition: A | Discharge status: Home / Self Care | Source: Ambulatory Visit | Attending: Surgery

## 2023-10-15 ENCOUNTER — Other Ambulatory Visit: Payer: Self-pay | Admitting: Surgery

## 2023-10-15 ENCOUNTER — Ambulatory Visit
Admission: RE | Admit: 2023-10-15 | Discharge: 2023-10-15 | Disposition: A | Discharge status: Home / Self Care | Source: Ambulatory Visit | Attending: Surgery | Admitting: Surgery

## 2023-10-15 DIAGNOSIS — N6314 Unspecified lump in the right breast, lower inner quadrant: Secondary | ICD-10-CM

## 2023-10-15 DIAGNOSIS — N63 Unspecified lump in unspecified breast: Secondary | ICD-10-CM

## 2023-10-18 ENCOUNTER — Ambulatory Visit (INDEPENDENT_AMBULATORY_CARE_PROVIDER_SITE_OTHER)

## 2023-10-18 ENCOUNTER — Encounter (INDEPENDENT_AMBULATORY_CARE_PROVIDER_SITE_OTHER): Payer: Self-pay

## 2023-10-18 VITALS — Ht 61.0 in | Wt 150.0 lb

## 2023-10-18 DIAGNOSIS — R1310 Dysphagia, unspecified: Secondary | ICD-10-CM | POA: Diagnosis not present

## 2023-10-18 DIAGNOSIS — H9193 Unspecified hearing loss, bilateral: Secondary | ICD-10-CM

## 2023-10-18 NOTE — Progress Notes (Unsigned)
 Dear Dr. Marline Simons, Here is my assessment for our mutual patient, Amy Mckay. Thank you for allowing me the opportunity to care for your patient. Please do not hesitate to contact me should you have any other questions. Sincerely, Belma Boxer PA-C  Otolaryngology Clinic Note Referring provider: Dr. Marline Simons HPI:  Amy Mckay is a 89 y.o. female kindly referred by Dr. Marline Simons   The patient is an 89 year old female seen in our office for follow-up evaluation for hearing loss.  The patient was last seen in the office on 09/02/2023 with impacted cerumen.  The cerumen was removed.  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  She was being seen by [facility], given the cerumen she was unable to have her audiological evaluation or hearing aid adjustment.  She was instructed to follow-up in our office today for review of her audiogram.  She notes she does not have the audiology results, she is accompanied by caretaker who reports that she did have evaluation by audiology but does not have the audiology results at the care facility, they did adjust her hearing aids, the patient notes that she has had improvement in her hearing with adjustment of her hearing aids.    The patient also notes a longstanding history of difficulty swallowing.  She has been seen by GI for this in 2023 where she had barium swallow showing no stricture but they did see esophageal motility likely contributing to her dysphagia.  She reports today that she has a follow-up evaluation with GI and is undergoing a swallow study coming up.  She is uncertain when this is going to happen.       Independent Review of Additional Tests or Records:  GI Consultation on 5/07/17/21 By Norrine Bedford / Baldo Bonds   I personally saw the patient and performed a substantive portion of this encounter, including a complete performance of at least one of the key components (MDM, Hx and/or Exam), in  conjunction with the Advanced Practice Provider Norrine Bedford, PA-C.   Trouble swallowing food and/or liquids intermittently for over 5 years. Cannot pinpoint a food that hangs up the most. History of GERD on PPI BID.    Gen - elderly, lethargic, no acute distress CV - RRR Chest - CTA B Abd - soft, nontender, nondistended, +BS Ext -  no edema   Chronic dysphagia likely due to dysmotility. Barium swallow limited views but no stricture seen. Esophageal dysmotility noted. Anemia without any overt bleeding. Supportive care. Hold off on endoscopic evaluation and manage conservatively. Advance diet as tolerated. Will follow.    PMH/Meds/All/SocHx/FamHx/ROS:   Past Medical History:  Diagnosis Date   Acid reflux    COPD (chronic obstructive pulmonary disease) (HCC)    Hypertension      Past Surgical History:  Procedure Laterality Date   BIOPSY  10/17/2021   Procedure: BIOPSY;  Surgeon: Baldo Bonds, MD;  Location: [facility];  Service: Gastroenterology;;   BIOPSY  01/29/2023   Procedure: BIOPSY;  Surgeon: Baldo Bonds, MD;  Location: [facility];  Service: Gastroenterology;;   COLONOSCOPY N/A 10/17/2021   Procedure: COLONOSCOPY;  Surgeon: Baldo Bonds, MD;  Location: [facility];  Service: Gastroenterology;  Laterality: N/A;   COLONOSCOPY WITH PROPOFOL  N/A 10/18/2021   Procedure: COLONOSCOPY WITH PROPOFOL ;  Surgeon: Felecia Hopper, MD;  Location: [facility];  Service: Gastroenterology;  Laterality: N/A;   ESOPHAGOGASTRODUODENOSCOPY N/A 10/17/2021   Procedure: ESOPHAGOGASTRODUODENOSCOPY (EGD);  Surgeon: Baldo Bonds, MD;  Location: [facility];  Service: Gastroenterology;  Laterality: N/A;   FLEXIBLE SIGMOIDOSCOPY N/A 01/29/2023   Procedure: FLEXIBLE SIGMOIDOSCOPY;  Surgeon: Baldo Bonds, MD;  Location: [facility];  Service: Gastroenterology;  Laterality: N/A;   POLYPECTOMY  10/18/2021   Procedure: POLYPECTOMY;  Surgeon: Felecia Hopper, MD;  Location: [facility];  Service: Gastroenterology;;    History reviewed. No pertinent family history.   Social Connections: Patient Declined (06/14/2023)   Social Connection and Isolation Panel [NHANES]    Frequency of Communication with Friends and Family: Patient declined    Frequency of Social Gatherings with Friends and Family: Patient declined    Attends Religious Services: Patient declined    Database administrator or Organizations: Patient declined    Attends Banker Meetings: Patient declined    Marital Status: Patient declined      Current Outpatient Medications:    acetaminophen  (TYLENOL ) 500 MG tablet, Take 1,000 mg by mouth in the morning, at noon, and at bedtime., Disp: , Rfl:    albuterol  (VENTOLIN  HFA) 108 (90 Base) MCG/ACT inhaler, Inhale 2 puffs into the lungs every 6 (six) hours as needed for wheezing or shortness of breath., Disp: , Rfl:    Artificial Saliva (BIOTENE DRY MOUTH MOISTURIZING) SOLN, Use as directed 1 Capful in the mouth or throat in the morning and at bedtime. Swish and spit, Disp: , Rfl:    aspirin  EC 81 MG tablet, Take 81 mg by mouth daily. Swallow whole., Disp: , Rfl:    atorvastatin  (LIPITOR ) 80 MG tablet, Take 1 tablet (80 mg total) by mouth daily., Disp: , Rfl:    Budeson-Glycopyrrol-Formoterol  (BREZTRI  AEROSPHERE) 160-9-4.8 MCG/ACT AERO, Inhale 2 puffs into the lungs in the morning and at bedtime., Disp: 10.7 g, Rfl: 11   Calcium  Carb-Cholecalciferol  (CALCIUM  + VITAMIN D3 PO), Take 1 tablet by mouth daily., Disp: , Rfl:    DULoxetine  (CYMBALTA ) 60 MG capsule, Take 60 mg by mouth daily., Disp: , Rfl:    ferrous gluconate  (FERGON) 324 MG tablet, Take 1 tablet (324 mg total) by mouth daily with breakfast., Disp: , Rfl:    fluticasone  (FLONASE) 50 MCG/ACT nasal spray, Place 1 spray into both nostrils daily., Disp: , Rfl:    gabapentin  (NEURONTIN ) 300 MG capsule, Take 300 mg by mouth 2 (two) times daily., Disp: , Rfl:    hydrocortisone  (ANUSOL -HC) 2.5 %  rectal cream, Place rectally 4 (four) times daily as needed for hemorrhoids or anal itching., Disp: , Rfl:    Ipratropium-Albuterol  (COMBIVENT) 20-100 MCG/ACT AERS respimat, Inhale 1 puff into the lungs 2 (two) times daily., Disp: , Rfl:    levothyroxine  (SYNTHROID , LEVOTHROID) 75 MCG tablet, Take 75 mcg by mouth daily before breakfast., Disp: , Rfl:    lidocaine  (XYLOCAINE ) 2 % injection, 10 mLs by Infiltration route once., Disp: , Rfl:    memantine  (NAMENDA ) 10 MG tablet, Take 10 mg by mouth 2 (two) times daily., Disp: , Rfl:    metoprolol  succinate (TOPROL -XL) 25 MG 24 hr tablet, Take 0.5 tablets (12.5 mg total) by mouth daily., Disp: , Rfl:    Multiple Vitamin (MULTIVITAMIN WITH MINERALS) TABS tablet, Take 1 tablet by mouth daily with breakfast., Disp: , Rfl:    ondansetron  (ZOFRAN -ODT) 4 MG disintegrating tablet, Take 4 mg by mouth every 8 (eight) hours as needed., Disp: , Rfl:    oxyCODONE  (OXY IR/ROXICODONE ) 5 MG immediate release tablet, Take 1-2 tablets (5-10 mg total) by mouth every 4 (four) hours as needed for moderate pain (pain score 4-6)., Disp:  30 tablet, Rfl: 0   pantoprazole  (PROTONIX ) 40 MG tablet, Take 1 tablet (40 mg total) by mouth 2 (two) times daily., Disp: 60 tablet, Rfl: 11   phenylephrine -shark liver oil-mineral oil-petrolatum (PREPARATION H) 0.25-14-74.9 % rectal ointment, Place 1 Application rectally every 12 (twelve) hours., Disp: , Rfl:    polyethylene glycol (MIRALAX  / GLYCOLAX ) 17 g packet, Take 17 g by mouth daily., Disp: , Rfl:    pramipexole  (MIRAPEX ) 0.25 MG tablet, Take 1 tablet (0.25 mg total) by mouth 2 (two) times daily. (Patient taking differently: Take 0.25 mg by mouth 3 (three) times daily.), Disp: , Rfl:    PSYLLIUM HUSK PO, Take 0.4 g by mouth in the morning and at bedtime., Disp: , Rfl:    sacubitril -valsartan  (ENTRESTO ) 24-26 MG, Take 1 tablet by mouth 2 (two) times daily., Disp: , Rfl:    senna-docusate (SENOKOT-S) 8.6-50 MG tablet, Take 1 tablet by  mouth 2 (two) times daily., Disp: , Rfl:    sodium chloride  (OCEAN) 0.65 % SOLN nasal spray, Place 1 spray into both nostrils in the morning, at noon, and at bedtime., Disp: , Rfl:    sucralfate  (CARAFATE ) 1 g tablet, Take 1 tablet (1 g total) by mouth 4 (four) times daily -  with meals and at bedtime., Disp: , Rfl:    ticagrelor  (BRILINTA ) 90 MG TABS tablet, Take 1 tablet (90 mg total) by mouth 2 (two) times daily., Disp: 60 tablet, Rfl:    vitamin C (ASCORBIC ACID ) 500 MG tablet, Take 500 mg by mouth daily., Disp: , Rfl:    Physical Exam:   Ht 5\' 1"  (1.549 m)   Wt 150 lb (68 kg)   BMI 28.34 kg/m   Pertinent Findings  CN II-XII intact  Bilateral EAC clear and TM intact with well pneumatized middle ear spaces Weber 512: equal Rinne 512: AC > BC b/l  Anterior rhinoscopy: Septum midline No lesions of oral cavity/oropharynx, no pooling of secretions no hoarseness No obviously palpable neck masses/lymphadenopathy/thyromegaly No respiratory distress or stridor  Seprately Identifiable Procedures:  None  Impression & Plans:  Amy Mckay is a 89 y.o. female with the following   Decreased hearing-   The patient was supposed to follow-up with illogical results today for my review.  They do not have the results.  The patient is happy with her hearing at this point.  I did advise her I am happy to review the audiological results at any point in the future if she would like.  Dysphagia-  The patient reports difficulty swallowing.  She has had this for several years, she is currently having workup with GI who previously worked this up.  At this point I have offered nasal endoscope but given her previous esophageal motility she would like to continue outpatient workup with GI.  Am happy to see her back at any point for repeat evaluation.   - f/u PRN   Thank you for allowing me the opportunity to care for your patient. Please do not hesitate to contact me should you have any other  questions.  Sincerely, Belma Boxer PA-C [facility] Phone: [phone] Fax: [phone]  10/18/2023, 10:17 AM

## 2023-12-15 ENCOUNTER — Other Ambulatory Visit: Payer: Self-pay | Admitting: *Deleted

## 2023-12-15 DIAGNOSIS — R109 Unspecified abdominal pain: Secondary | ICD-10-CM

## 2023-12-16 ENCOUNTER — Ambulatory Visit: Admitting: Cardiology

## 2023-12-16 ENCOUNTER — Ambulatory Visit: Payer: 59 | Admitting: Physician Assistant

## 2023-12-29 ENCOUNTER — Ambulatory Visit: Admitting: Vascular Surgery

## 2023-12-29 ENCOUNTER — Encounter (HOSPITAL_COMMUNITY)

## 2023-12-31 ENCOUNTER — Encounter (HOSPITAL_COMMUNITY): Payer: Self-pay

## 2023-12-31 ENCOUNTER — Emergency Department (HOSPITAL_COMMUNITY)

## 2023-12-31 ENCOUNTER — Other Ambulatory Visit: Payer: Self-pay

## 2023-12-31 ENCOUNTER — Inpatient Hospital Stay (HOSPITAL_COMMUNITY)
Admission: EM | Admit: 2023-12-31 | Discharge: 2024-01-04 | DRG: 177 | Disposition: A | Discharge status: Skilled Nursing Facility | Attending: Internal Medicine | Admitting: Internal Medicine

## 2023-12-31 DIAGNOSIS — J9611 Chronic respiratory failure with hypoxia: Secondary | ICD-10-CM | POA: Diagnosis present

## 2023-12-31 DIAGNOSIS — R739 Hyperglycemia, unspecified: Secondary | ICD-10-CM | POA: Diagnosis present

## 2023-12-31 DIAGNOSIS — Z9981 Dependence on supplemental oxygen: Secondary | ICD-10-CM | POA: Diagnosis not present

## 2023-12-31 DIAGNOSIS — E876 Hypokalemia: Secondary | ICD-10-CM | POA: Diagnosis present

## 2023-12-31 DIAGNOSIS — W06XXXA Fall from bed, initial encounter: Secondary | ICD-10-CM | POA: Diagnosis present

## 2023-12-31 DIAGNOSIS — Z66 Do not resuscitate: Secondary | ICD-10-CM | POA: Diagnosis present

## 2023-12-31 DIAGNOSIS — Z87891 Personal history of nicotine dependence: Secondary | ICD-10-CM

## 2023-12-31 DIAGNOSIS — N179 Acute kidney failure, unspecified: Secondary | ICD-10-CM | POA: Diagnosis present

## 2023-12-31 DIAGNOSIS — J219 Acute bronchiolitis, unspecified: Secondary | ICD-10-CM | POA: Diagnosis present

## 2023-12-31 DIAGNOSIS — Z8673 Personal history of transient ischemic attack (TIA), and cerebral infarction without residual deficits: Secondary | ICD-10-CM

## 2023-12-31 DIAGNOSIS — K551 Chronic vascular disorders of intestine: Secondary | ICD-10-CM | POA: Diagnosis present

## 2023-12-31 DIAGNOSIS — R350 Frequency of micturition: Secondary | ICD-10-CM | POA: Diagnosis present

## 2023-12-31 DIAGNOSIS — R062 Wheezing: Secondary | ICD-10-CM

## 2023-12-31 DIAGNOSIS — J690 Pneumonitis due to inhalation of food and vomit: Secondary | ICD-10-CM | POA: Diagnosis present

## 2023-12-31 DIAGNOSIS — E039 Hypothyroidism, unspecified: Secondary | ICD-10-CM | POA: Diagnosis present

## 2023-12-31 DIAGNOSIS — I119 Hypertensive heart disease without heart failure: Secondary | ICD-10-CM | POA: Diagnosis present

## 2023-12-31 DIAGNOSIS — Y92122 Bedroom in nursing home as the place of occurrence of the external cause: Secondary | ICD-10-CM

## 2023-12-31 DIAGNOSIS — K623 Rectal prolapse: Secondary | ICD-10-CM | POA: Diagnosis present

## 2023-12-31 DIAGNOSIS — J449 Chronic obstructive pulmonary disease, unspecified: Secondary | ICD-10-CM

## 2023-12-31 DIAGNOSIS — S40021A Contusion of right upper arm, initial encounter: Secondary | ICD-10-CM | POA: Diagnosis present

## 2023-12-31 DIAGNOSIS — Z79899 Other long term (current) drug therapy: Secondary | ICD-10-CM

## 2023-12-31 DIAGNOSIS — R609 Edema, unspecified: Secondary | ICD-10-CM | POA: Diagnosis not present

## 2023-12-31 DIAGNOSIS — I252 Old myocardial infarction: Secondary | ICD-10-CM

## 2023-12-31 DIAGNOSIS — R579 Shock, unspecified: Secondary | ICD-10-CM | POA: Diagnosis not present

## 2023-12-31 DIAGNOSIS — J440 Chronic obstructive pulmonary disease with acute lower respiratory infection: Secondary | ICD-10-CM | POA: Diagnosis present

## 2023-12-31 DIAGNOSIS — K625 Hemorrhage of anus and rectum: Secondary | ICD-10-CM | POA: Diagnosis present

## 2023-12-31 DIAGNOSIS — D649 Anemia, unspecified: Secondary | ICD-10-CM | POA: Diagnosis not present

## 2023-12-31 DIAGNOSIS — K219 Gastro-esophageal reflux disease without esophagitis: Secondary | ICD-10-CM | POA: Diagnosis present

## 2023-12-31 DIAGNOSIS — Y838 Other surgical procedures as the cause of abnormal reaction of the patient, or of later complication, without mention of misadventure at the time of the procedure: Secondary | ICD-10-CM | POA: Diagnosis present

## 2023-12-31 DIAGNOSIS — A419 Sepsis, unspecified organism: Secondary | ICD-10-CM | POA: Diagnosis present

## 2023-12-31 DIAGNOSIS — S60221A Contusion of right hand, initial encounter: Secondary | ICD-10-CM | POA: Diagnosis present

## 2023-12-31 DIAGNOSIS — R1314 Dysphagia, pharyngoesophageal phase: Secondary | ICD-10-CM | POA: Diagnosis present

## 2023-12-31 DIAGNOSIS — W19XXXA Unspecified fall, initial encounter: Principal | ICD-10-CM

## 2023-12-31 DIAGNOSIS — Z79890 Hormone replacement therapy (postmenopausal): Secondary | ICD-10-CM

## 2023-12-31 DIAGNOSIS — R571 Hypovolemic shock: Secondary | ICD-10-CM | POA: Diagnosis present

## 2023-12-31 DIAGNOSIS — J9601 Acute respiratory failure with hypoxia: Secondary | ICD-10-CM

## 2023-12-31 DIAGNOSIS — T82838A Hemorrhage of vascular prosthetic devices, implants and grafts, initial encounter: Secondary | ICD-10-CM | POA: Diagnosis not present

## 2023-12-31 DIAGNOSIS — D62 Acute posthemorrhagic anemia: Secondary | ICD-10-CM | POA: Diagnosis present

## 2023-12-31 DIAGNOSIS — Z95820 Peripheral vascular angioplasty status with implants and grafts: Secondary | ICD-10-CM

## 2023-12-31 DIAGNOSIS — Z7902 Long term (current) use of antithrombotics/antiplatelets: Secondary | ICD-10-CM

## 2023-12-31 DIAGNOSIS — E861 Hypovolemia: Secondary | ICD-10-CM | POA: Diagnosis present

## 2023-12-31 DIAGNOSIS — I959 Hypotension, unspecified: Secondary | ICD-10-CM | POA: Diagnosis present

## 2023-12-31 DIAGNOSIS — S0990XA Unspecified injury of head, initial encounter: Secondary | ICD-10-CM

## 2023-12-31 DIAGNOSIS — R578 Other shock: Secondary | ICD-10-CM | POA: Diagnosis not present

## 2023-12-31 DIAGNOSIS — G2581 Restless legs syndrome: Secondary | ICD-10-CM | POA: Diagnosis present

## 2023-12-31 DIAGNOSIS — Z7982 Long term (current) use of aspirin: Secondary | ICD-10-CM

## 2023-12-31 DIAGNOSIS — R6521 Severe sepsis with septic shock: Secondary | ICD-10-CM | POA: Diagnosis not present

## 2023-12-31 HISTORY — DX: Essential (primary) hypertension: I10

## 2023-12-31 HISTORY — DX: Gastro-esophageal reflux disease without esophagitis: K21.9

## 2023-12-31 HISTORY — DX: Chronic obstructive pulmonary disease, unspecified: J44.9

## 2023-12-31 LAB — CBC
HCT: 33.8 % — ABNORMAL LOW (ref 36.0–46.0)
HCT: 41.2 % (ref 36.0–46.0)
HCT: 41 % (ref 36.0–46.0)
Hemoglobin: 11 g/dL — ABNORMAL LOW (ref 12.0–15.0)
Hemoglobin: 13.6 g/dL (ref 12.0–15.0)
Hemoglobin: 13.8 g/dL (ref 12.0–15.0)
MCH: 29 pg (ref 26.0–34.0)
MCH: 28.6 pg (ref 26.0–34.0)
MCH: 28.9 pg (ref 26.0–34.0)
MCHC: 32.5 g/dL (ref 30.0–36.0)
MCHC: 33 g/dL (ref 30.0–36.0)
MCHC: 33.7 g/dL (ref 30.0–36.0)
MCV: 89.2 fL (ref 80.0–100.0)
MCV: 86.6 fL (ref 80.0–100.0)
MCV: 86 fL (ref 80.0–100.0)
Platelets: 295 K/uL (ref 150–400)
Platelets: 346 K/uL (ref 150–400)
Platelets: 333 K/uL (ref 150–400)
RBC: 3.79 MIL/uL — ABNORMAL LOW (ref 3.87–5.11)
RBC: 4.76 MIL/uL (ref 3.87–5.11)
RBC: 4.77 MIL/uL (ref 3.87–5.11)
RDW: 16.3 % — ABNORMAL HIGH (ref 11.5–15.5)
RDW: 16.8 % — ABNORMAL HIGH (ref 11.5–15.5)
RDW: 17 % — ABNORMAL HIGH (ref 11.5–15.5)
WBC: 22.4 K/uL — ABNORMAL HIGH (ref 4.0–10.5)
WBC: 21.4 K/uL — ABNORMAL HIGH (ref 4.0–10.5)
WBC: 17.8 K/uL — ABNORMAL HIGH (ref 4.0–10.5)
nRBC: 0.1 % (ref 0.0–0.2)
nRBC: 0.3 % — ABNORMAL HIGH (ref 0.0–0.2)
nRBC: 0.3 % — ABNORMAL HIGH (ref 0.0–0.2)

## 2023-12-31 LAB — GLUCOSE, CAPILLARY
Glucose-Capillary: 131 mg/dL — ABNORMAL HIGH (ref 70–99)
Glucose-Capillary: 236 mg/dL — ABNORMAL HIGH (ref 70–99)
Glucose-Capillary: 105 mg/dL — ABNORMAL HIGH (ref 70–99)

## 2023-12-31 LAB — COMPREHENSIVE METABOLIC PANEL WITH GFR
ALT: 23 U/L (ref 0–44)
AST: 36 U/L (ref 15–41)
Albumin: 2.8 g/dL — ABNORMAL LOW (ref 3.5–5.0)
Alkaline Phosphatase: 63 U/L (ref 38–126)
Anion gap: 16 — ABNORMAL HIGH (ref 5–15)
BUN: 26 mg/dL — ABNORMAL HIGH (ref 8–23)
CO2: 25 mmol/L (ref 22–32)
Calcium: 8.2 mg/dL — ABNORMAL LOW (ref 8.9–10.3)
Chloride: 96 mmol/L — ABNORMAL LOW (ref 98–111)
Creatinine, Ser: 1.11 mg/dL — ABNORMAL HIGH (ref 0.44–1.00)
GFR, Estimated: 48 mL/min — ABNORMAL LOW (ref >60)
Glucose, Bld: 145 mg/dL — ABNORMAL HIGH (ref 70–99)
Potassium: 3 mmol/L — ABNORMAL LOW (ref 3.5–5.1)
Sodium: 137 mmol/L (ref 135–145)
Total Bilirubin: 0.7 mg/dL (ref 0.0–1.2)
Total Protein: 6.5 g/dL (ref 6.5–8.1)

## 2023-12-31 LAB — I-STAT CHEM 8, ED
BUN: 27 mg/dL — ABNORMAL HIGH (ref 8–23)
Calcium, Ion: 0.96 mmol/L — ABNORMAL LOW (ref 1.15–1.40)
Chloride: 95 mmol/L — ABNORMAL LOW (ref 98–111)
Creatinine, Ser: 1.1 mg/dL — ABNORMAL HIGH (ref 0.44–1.00)
Glucose, Bld: 145 mg/dL — ABNORMAL HIGH (ref 70–99)
HCT: 36 % (ref 36.0–46.0)
Hemoglobin: 12.2 g/dL (ref 12.0–15.0)
Potassium: 2.9 mmol/L — ABNORMAL LOW (ref 3.5–5.1)
Sodium: 138 mmol/L (ref 135–145)
TCO2: 29 mmol/L (ref 22–32)

## 2023-12-31 LAB — URINALYSIS, ROUTINE W REFLEX MICROSCOPIC
Bacteria, UA: NONE SEEN
Bilirubin Urine: NEGATIVE
Glucose, UA: NEGATIVE mg/dL
Ketones, ur: NEGATIVE mg/dL
Leukocytes,Ua: NEGATIVE
Nitrite: NEGATIVE
Protein, ur: NEGATIVE mg/dL
RBC / HPF: >50 RBC/hpf (ref 0–5)
Specific Gravity, Urine: 1.01 (ref 1.005–1.030)
pH: 6 (ref 5.0–8.0)

## 2023-12-31 LAB — I-STAT CG4 LACTIC ACID, ED: Lactic Acid, Venous: 2.3 mmol/L (ref 0.5–1.9)

## 2023-12-31 LAB — CK: Total CK: 139 U/L (ref 38–234)

## 2023-12-31 LAB — TSH: TSH: 1.473 u[IU]/mL (ref 0.350–4.500)

## 2023-12-31 LAB — PROTIME-INR
INR: 1.1 (ref 0.8–1.2)
Prothrombin Time: 14.9 s (ref 11.4–15.2)

## 2023-12-31 LAB — CARBAMAZEPINE LEVEL, TOTAL: Carbamazepine Lvl: <2 ug/mL — ABNORMAL LOW (ref 4.0–12.0)

## 2023-12-31 LAB — ABO/RH: ABO/RH(D): O NEG

## 2023-12-31 LAB — HEMOGLOBIN A1C
Hgb A1c MFr Bld: 5.5 % (ref 4.8–5.6)
Mean Plasma Glucose: 111.15 mg/dL

## 2023-12-31 LAB — ETHANOL: Alcohol, Ethyl (B): <15 mg/dL (ref <15)

## 2023-12-31 LAB — MRSA NEXT GEN BY PCR, NASAL: MRSA by PCR Next Gen: NOT DETECTED

## 2023-12-31 LAB — LACTIC ACID, PLASMA: Lactic Acid, Venous: 3.6 mmol/L (ref 0.5–1.9)

## 2023-12-31 MED ORDER — POTASSIUM CHLORIDE CRYS ER 20 MEQ PO TBCR
40.0000 meq | EXTENDED_RELEASE_TABLET | Freq: Once | ORAL | Status: AC
Start: 2023-12-31 — End: 2023-12-31
  Administered 2023-12-31: 40 meq via ORAL
  Filled 2023-12-31: qty 2

## 2023-12-31 MED ORDER — ACETAMINOPHEN 325 MG PO TABS
650.0000 mg | ORAL_TABLET | ORAL | Status: DC | PRN
Start: 2023-12-31 — End: 2024-01-04
  Administered 2024-01-01: 650 mg via ORAL
  Filled 2023-12-31: qty 2

## 2023-12-31 MED ORDER — HYDROMORPHONE HCL 1 MG/ML IJ SOLN
0.5000 mg | INTRAMUSCULAR | Status: DC | PRN
  Administered 2023-12-31 – 2024-01-04 (×19): 0.5 mg via INTRAVENOUS
  Filled 2023-12-31: qty 0.5
  Filled 2023-12-31: qty 1
  Filled 2023-12-31 (×3): qty 0.5
  Filled 2023-12-31 (×3): qty 1
  Filled 2023-12-31 (×5): qty 0.5
  Filled 2023-12-31: qty 1
  Filled 2023-12-31 (×6): qty 0.5

## 2023-12-31 MED ORDER — METRONIDAZOLE 500 MG/100ML IV SOLN
500.0000 mg | Freq: Once | INTRAVENOUS | Status: AC
  Administered 2023-12-31: 500 mg via INTRAVENOUS
  Filled 2023-12-31: qty 100

## 2023-12-31 MED ORDER — ARFORMOTEROL TARTRATE 15 MCG/2ML IN NEBU
15.0000 ug | INHALATION_SOLUTION | Freq: Two times a day (BID) | RESPIRATORY_TRACT | Status: DC
  Administered 2023-12-31 – 2024-01-04 (×8): 15 ug via RESPIRATORY_TRACT
  Filled 2023-12-31 (×10): qty 2

## 2023-12-31 MED ORDER — REVEFENACIN 175 MCG/3ML IN SOLN
175.0000 ug | Freq: Every day | RESPIRATORY_TRACT | Status: DC
  Administered 2024-01-01 – 2024-01-04 (×4): 175 ug via RESPIRATORY_TRACT
  Filled 2023-12-31 (×5): qty 3

## 2023-12-31 MED ORDER — METHYLPREDNISOLONE SODIUM SUCC 40 MG IJ SOLR
40.0000 mg | INTRAMUSCULAR | Status: DC
  Filled 2023-12-31: qty 1

## 2023-12-31 MED ORDER — TETANUS-DIPHTH-ACELL PERTUSSIS 5-2.5-18.5 LF-MCG/0.5 IM SUSY: 0.5000 mL | PREFILLED_SYRINGE | Freq: Once | INTRAMUSCULAR | Status: DC

## 2023-12-31 MED ORDER — IPRATROPIUM-ALBUTEROL 0.5-2.5 (3) MG/3ML IN SOLN: 3.0000 mL | RESPIRATORY_TRACT | Status: DC | PRN

## 2023-12-31 MED ORDER — POTASSIUM CHLORIDE 10 MEQ/100ML IV SOLN
10.0000 meq | INTRAVENOUS | Status: AC
  Administered 2023-12-31 (×4): 10 meq via INTRAVENOUS
  Filled 2023-12-31 (×4): qty 100

## 2023-12-31 MED ORDER — LEVOTHYROXINE SODIUM 75 MCG PO TABS
75.0000 ug | ORAL_TABLET | Freq: Every day | ORAL | Status: DC
  Administered 2024-01-01 – 2024-01-04 (×4): 75 ug via ORAL
  Filled 2023-12-31 (×4): qty 1

## 2023-12-31 MED ORDER — ONDANSETRON HCL 4 MG/2ML IJ SOLN
4.0000 mg | Freq: Four times a day (QID) | INTRAMUSCULAR | Status: DC | PRN
  Administered 2024-01-01 – 2024-01-03 (×2): 4 mg via INTRAVENOUS
  Filled 2023-12-31 (×2): qty 2

## 2023-12-31 MED ORDER — OXIDIZED CELLULOSE EX PADS
1.0000 | MEDICATED_PAD | Freq: Once | CUTANEOUS | Status: AC
  Administered 2023-12-31: 1 via TOPICAL
  Filled 2023-12-31: qty 1

## 2023-12-31 MED ORDER — INSULIN ASPART 100 UNIT/ML IJ SOLN: 0.0000 [IU] | Freq: Every day | INTRAMUSCULAR | Status: DC

## 2023-12-31 MED ORDER — OXYCODONE HCL 5 MG PO TABS
10.0000 mg | ORAL_TABLET | Freq: Four times a day (QID) | ORAL | Status: DC | PRN
  Administered 2023-12-31 – 2024-01-04 (×9): 10 mg via ORAL
  Filled 2023-12-31 (×11): qty 2

## 2023-12-31 MED ORDER — INSULIN ASPART 100 UNIT/ML IJ SOLN
0.0000 [IU] | Freq: Three times a day (TID) | INTRAMUSCULAR | Status: DC
  Administered 2023-12-31: 5 [IU] via SUBCUTANEOUS
  Administered 2023-12-31 – 2024-01-01 (×2): 2 [IU] via SUBCUTANEOUS

## 2023-12-31 MED ORDER — CALCIUM GLUCONATE-NACL 2-0.675 GM/100ML-% IV SOLN
2.0000 g | Freq: Once | INTRAVENOUS | Status: AC
  Administered 2023-12-31: 2000 mg via INTRAVENOUS
  Filled 2023-12-31: qty 100

## 2023-12-31 MED ORDER — OXIDIZED CELLULOSE EX PADS
1.0000 | MEDICATED_PAD | Freq: Once | CUTANEOUS | Status: AC
  Administered 2024-01-01: 1 via TOPICAL
  Filled 2023-12-31: qty 1

## 2023-12-31 MED ORDER — METHYLPREDNISOLONE SODIUM SUCC 125 MG IJ SOLR
125.0000 mg | INTRAMUSCULAR | Status: AC
  Administered 2023-12-31: 125 mg via INTRAVENOUS
  Filled 2023-12-31: qty 2

## 2023-12-31 MED ORDER — ONDANSETRON HCL 4 MG/2ML IJ SOLN
4.0000 mg | Freq: Once | INTRAMUSCULAR | Status: AC
  Administered 2023-12-31: 4 mg via INTRAVENOUS
  Filled 2023-12-31: qty 2

## 2023-12-31 MED ORDER — IPRATROPIUM-ALBUTEROL 0.5-2.5 (3) MG/3ML IN SOLN
6.0000 mL | Freq: Once | RESPIRATORY_TRACT | Status: AC
  Administered 2023-12-31: 6 mL via RESPIRATORY_TRACT
  Filled 2023-12-31: qty 3

## 2023-12-31 MED ORDER — SODIUM CHLORIDE 0.9% IV SOLUTION: Freq: Once | INTRAVENOUS | Status: DC

## 2023-12-31 MED ORDER — GABAPENTIN 400 MG PO CAPS
500.0000 mg | ORAL_CAPSULE | Freq: Three times a day (TID) | ORAL | Status: DC
  Administered 2023-12-31 – 2024-01-04 (×12): 500 mg via ORAL
  Filled 2023-12-31: qty 1
  Filled 2023-12-31: qty 2
  Filled 2023-12-31 (×4): qty 1
  Filled 2023-12-31: qty 2
  Filled 2023-12-31 (×2): qty 1
  Filled 2023-12-31: qty 2
  Filled 2023-12-31: qty 1
  Filled 2023-12-31: qty 2

## 2023-12-31 MED ORDER — VANCOMYCIN HCL IN DEXTROSE 1-5 GM/200ML-% IV SOLN: 1000.0000 mg | Freq: Once | INTRAVENOUS | Status: DC

## 2023-12-31 MED ORDER — NOREPINEPHRINE 4 MG/250ML-% IV SOLN
0.0000 ug/min | INTRAVENOUS | Status: DC
  Administered 2023-12-31: 10 ug/min via INTRAVENOUS
  Filled 2023-12-31: qty 250

## 2023-12-31 MED ORDER — ORAL CARE MOUTH RINSE: 15.0000 mL | OROMUCOSAL | Status: DC | PRN

## 2023-12-31 MED ORDER — IOHEXOL 350 MG/ML SOLN
75.0000 mL | Freq: Once | INTRAVENOUS | Status: AC | PRN
  Administered 2023-12-31: 75 mL via INTRAVENOUS

## 2023-12-31 MED ORDER — CHLORHEXIDINE GLUCONATE CLOTH 2 % EX PADS
6.0000 | MEDICATED_PAD | Freq: Every day | CUTANEOUS | Status: DC
  Administered 2023-12-31 – 2024-01-04 (×5): 6 via TOPICAL

## 2023-12-31 MED ORDER — METHYLPREDNISOLONE SODIUM SUCC 40 MG IJ SOLR: 40.0000 mg | INTRAMUSCULAR | Status: DC

## 2023-12-31 MED ORDER — VANCOMYCIN HCL 1500 MG/300ML IV SOLN: 1500.0000 mg | INTRAVENOUS | Status: DC

## 2023-12-31 MED ORDER — PIPERACILLIN-TAZOBACTAM 3.375 G IVPB
3.3750 g | Freq: Three times a day (TID) | INTRAVENOUS | Status: DC
  Administered 2023-12-31 – 2024-01-01 (×2): 3.375 g via INTRAVENOUS
  Filled 2023-12-31 (×2): qty 50

## 2023-12-31 MED ORDER — SODIUM CHLORIDE 0.9 % IV SOLN
2.0000 g | Freq: Once | INTRAVENOUS | Status: AC
  Administered 2023-12-31: 2 g via INTRAVENOUS
  Filled 2023-12-31: qty 12.5

## 2023-12-31 MED ORDER — DOCUSATE SODIUM 100 MG PO CAPS: 100.0000 mg | ORAL_CAPSULE | Freq: Two times a day (BID) | ORAL | Status: DC | PRN

## 2023-12-31 MED ORDER — PRAMIPEXOLE DIHYDROCHLORIDE 0.25 MG PO TABS
0.2500 mg | ORAL_TABLET | Freq: Two times a day (BID) | ORAL | Status: DC
  Administered 2023-12-31 – 2024-01-04 (×8): 0.25 mg via ORAL
  Filled 2023-12-31 (×9): qty 1

## 2023-12-31 MED ORDER — POLYETHYLENE GLYCOL 3350 17 G PO PACK: 17.0000 g | PACK | Freq: Every day | ORAL | Status: DC | PRN

## 2023-12-31 MED ORDER — VANCOMYCIN HCL 1500 MG/300ML IV SOLN
1500.0000 mg | Freq: Once | INTRAVENOUS | Status: AC
  Administered 2023-12-31: 1500 mg via INTRAVENOUS
  Filled 2023-12-31: qty 300

## 2023-12-31 MED ORDER — HYDROMORPHONE HCL 1 MG/ML IJ SOLN
0.5000 mg | Freq: Once | INTRAMUSCULAR | Status: AC
  Administered 2023-12-31: 0.5 mg via INTRAVENOUS
  Filled 2023-12-31: qty 1

## 2023-12-31 NOTE — ED Triage Notes (Addendum)
 Pt had unwitnessed fall around 0700. Pt is on Brillinta. Pts BP was 68/42 with EMS. 86% room air/wheezing. 2 neb tx given. Axox4. Hematoma to right arm/left hip/right hand. Pt hit head/LOC.

## 2023-12-31 NOTE — ED Provider Notes (Signed)
 [facility] AT [facility] Provider Note   CSN: [id number] Arrival date & time: 12/31/23  9074     Patient presents with: Amy Mckay is a 89 y.o. female.   89 year old female on Brilinta  who presents to the emergency department after an unwitnessed fall.  Patient reports that she was in bed when she was having a dream and accidentally rolled out of bed onto the ground.  Hit her right side.  Did hit her head.  911 was called and when they arrived her blood pressure was 68/42.  EMS reports that she does have a history of hypotension.  Also has a history of bleeding rectal prolapse and has been anemic from that in the past.       Prior to Admission medications   Medication Sig Start Date End Date Taking? Authorizing Provider  acetaminophen  (TYLENOL ) 500 MG tablet Take 1,000 mg by mouth 3 (three) times daily.   Yes [provider]  ascorbic acid  (VITAMIN C) 500 MG tablet Take 500 mg by mouth daily.   Yes [provider]  aspirin  EC 81 MG tablet Take 81 mg by mouth daily.   Yes [provider]  atorvastatin  (LIPITOR ) 80 MG tablet Take 80 mg by mouth at bedtime.   Yes [provider]  budesonide-glycopyrrolate-formoterol  (BREZTRI  AEROSPHERE) 160-9-4.8 MCG/ACT AERO inhaler Inhale 2 puffs into the lungs 2 (two) times daily.   Yes [provider]  Calcium  Carb-Cholecalciferol  (CALCIUM  + VITAMIN D3) 600-5 MG-MCG TABS Take 1 tablet by mouth daily at 12 noon.   Yes [provider]  dextromethorphan (DELSYM) 30 MG/5ML liquid Take 60 mg by mouth every 12 (twelve) hours as needed for cough.   Yes [provider]  DULoxetine  (CYMBALTA ) 60 MG capsule Take 60 mg by mouth daily.   Yes [provider]  ferrous sulfate 220 (44 Fe) MG/5ML solution Take 308 mg by mouth daily.   Yes [provider]  fluticasone  (FLONASE ALLERGY RELIEF) 50 MCG/ACT nasal spray Place 1 spray into both nostrils  daily.   Yes [provider]  gabapentin  (NEURONTIN ) 100 MG capsule Take 100 mg by mouth 3 (three) times daily. Give with 400mg  capsules to equal 500mg  three times daily.   Yes [provider]  gabapentin  (NEURONTIN ) 400 MG capsule Take 400 mg by mouth 3 (three) times daily. Give with 100mg  capsules to equal 500mg  three times daily.   Yes [provider]  guaiFENesin  (ROBITUSSIN) 100 MG/5ML liquid Take 200 mg by mouth 3 (three) times daily.   Yes [provider]  ipratropium-albuterol  (DUONEB) 0.5-2.5 (3) MG/3ML SOLN Take 3 mLs by nebulization 3 (three) times daily.   Yes [provider]  levofloxacin  (LEVAQUIN ) 500 MG tablet Take 500 mg by mouth at bedtime.   Yes [provider]  levothyroxine  (SYNTHROID ) 75 MCG tablet Take 75 mcg by mouth daily before breakfast.   Yes [provider]  memantine  (NAMENDA ) 10 MG tablet Take 10 mg by mouth 2 (two) times daily.   Yes [provider]  Menthol, Topical Analgesic, 4 % GEL Apply 1 application  topically 3 (three) times daily. Biofreeze. Apply to left shoulder.   Yes [provider]  metoprolol  succinate (TOPROL -XL) 25 MG 24 hr tablet Take 25 mg by mouth daily.   Yes [provider]  Miconazole POWD Apply 1 application  topically See admin instructions. Apply a thin dusting to groin area twice a day between meals as  needed for rash.   Yes [provider]  NONFORMULARY OR COMPOUNDED ITEM Apply 1 application  topically See admin instructions. Diltiazem 2%/Lidocaine  2% compounded solution. Apply a thin layer to rectal tissue twice daily for pain.   Yes [provider]  ondansetron  (ZOFRAN -ODT) 4 MG disintegrating tablet Take 4 mg by mouth 3 (three) times daily as needed for nausea or vomiting.   Yes [provider]  oxyCODONE  (ROXICODONE ) 15 MG immediate release tablet Take 15 mg by mouth See admin instructions. Give 1 tablet (15mg ) by mouth daily  as needed for moderate to severe pain between the hours of 0000-0400.   Yes [provider]  Oxycodone  HCl 10 MG TABS Take 10 mg by mouth 4 (four) times daily.   Yes [provider]  pantoprazole  (PROTONIX ) 40 MG tablet Take 40 mg by mouth 2 (two) times daily.   Yes [provider]  phenylephrine -shark liver oil-mineral oil-petrolatum (PREPARATION H) 0.25-14-74.9 % rectal ointment Place 1 Application rectally 4 (four) times daily.   Yes [provider]  polyethylene glycol powder (GLYCOLAX /MIRALAX ) 17 GM/SCOOP powder Take 17 g by mouth daily.   Yes [provider]  pramipexole  (MIRAPEX ) 0.25 MG tablet Take 0.25 mg by mouth 2 (two) times daily.   Yes [provider]  promethazine (PHENERGAN) 12.5 MG tablet Take 12.5 mg by mouth every 8 (eight) hours as needed for nausea.   Yes [provider]  sacubitril -valsartan  (ENTRESTO ) 24-26 MG Take 1 tablet by mouth 2 (two) times daily.   Yes [provider]  sennosides-docusate sodium  (SENOKOT-S) 8.6-50 MG tablet Take 1 tablet by mouth daily.   Yes [provider]  sodium chloride  (OCEAN) 0.65 % SOLN nasal spray Place 1 spray into both nostrils 3 (three) times daily.   Yes [provider]  sucralfate  (CARAFATE ) 1 g tablet Take 1 g by mouth 4 (four) times daily -  with meals and at bedtime.   Yes [provider]  ticagrelor  (BRILINTA ) 90 MG TABS tablet Take 90 mg by mouth 2 (two) times daily.   Yes [provider]  torsemide (DEMADEX) 10 MG tablet Take 10 mg by mouth daily. Give with 20mg  tablet to equal 30mg  daily.   Yes [provider]  torsemide (DEMADEX) 20 MG tablet Take 20 mg by mouth daily. Give with 10mg  tablet to equal 30mg  daily.   Yes [provider]    Allergies: Penicillins    Review of Systems  Updated Vital Signs BP (!) 123/95   Pulse (!) 102   Temp 98.7 F (37.1 C) (Oral)   Resp 17   Ht 5' 1 (1.549 m)   Wt  72.3 kg   SpO2 100%   BMI 30.12 kg/m   Physical Exam Vitals and nursing note reviewed.  Constitutional:      General: She is not in acute distress.    Appearance: She is well-developed.  HENT:     Head: Normocephalic and atraumatic.     Right Ear: External ear normal.     Left Ear: External ear normal.     Nose: Nose normal.  Eyes:     Extraocular Movements: Extraocular movements intact.     Conjunctiva/sclera: Conjunctivae normal.     Pupils: Pupils are equal, round, and reactive to light.     Comments: 4 mm bilaterally  Neck:     Comments: Placed in c-collar Cardiovascular:     Rate and Rhythm: Normal rate and regular rhythm.  Heart sounds: No murmur heard.    Comments: Able to Doppler pulses in all 4 extremities Pulmonary:     Effort: Pulmonary effort is normal. No respiratory distress.     Breath sounds: Normal breath sounds.  Abdominal:     General: Abdomen is flat. There is no distension.     Palpations: Abdomen is soft. There is no mass.     Tenderness: There is no abdominal tenderness. There is no guarding.  Musculoskeletal:     Right lower leg: No edema.     Left lower leg: No edema.     Comments: Bruising to the right hand on the dorsum.  Bruising of the right biceps.  Skin:    General: Skin is warm and dry.  Neurological:     Mental Status: She is alert and oriented to person, place, and time. Mental status is at baseline.     Cranial Nerves: No cranial nerve deficit.     Motor: No weakness.  Psychiatric:        Mood and Affect: Mood normal.     (all labs ordered are listed, but only abnormal results are displayed) Labs Reviewed  COMPREHENSIVE METABOLIC PANEL WITH GFR - Abnormal; Notable for the following components:      Result Value   Potassium 3.0 (*)    Chloride 96 (*)    Glucose, Bld 145 (*)    BUN 26 (*)    Creatinine, Ser 1.11 (*)    Calcium  8.2 (*)    Albumin 2.8 (*)    GFR, Estimated 48 (*)    Anion gap 16 (*)    All other components  within normal limits  CBC - Abnormal; Notable for the following components:   WBC 22.4 (*)    RBC 3.79 (*)    Hemoglobin 11.0 (*)    HCT 33.8 (*)    RDW 16.3 (*)    All other components within normal limits  URINALYSIS, ROUTINE W REFLEX MICROSCOPIC - Abnormal; Notable for the following components:   Color, Urine STRAW (*)    Hgb urine dipstick LARGE (*)    All other components within normal limits  CARBAMAZEPINE  LEVEL, TOTAL - Abnormal; Notable for the following components:   Carbamazepine  Lvl <2.0 (*)    All other components within normal limits  CBC - Abnormal; Notable for the following components:   WBC 21.4 (*)    RDW 16.8 (*)    nRBC 0.3 (*)    All other components within normal limits  CBC - Abnormal; Notable for the following components:   WBC 17.8 (*)    RDW 17.0 (*)    nRBC 0.3 (*)    All other components within normal limits  GLUCOSE, CAPILLARY - Abnormal; Notable for the following components:   Glucose-Capillary 131 (*)    All other components within normal limits  LACTIC ACID, PLASMA - Abnormal; Notable for the following components:   Lactic Acid, Venous 3.6 (*)    All other components within normal limits  GLUCOSE, CAPILLARY - Abnormal; Notable for the following components:   Glucose-Capillary 236 (*)    All other components within normal limits  CBC - Abnormal; Notable for the following components:   WBC 17.3 (*)    RDW 17.2 (*)    All other components within normal limits  BASIC METABOLIC PANEL WITH GFR - Abnormal; Notable for the following components:   Chloride 96 (*)    Glucose, Bld 131 (*)    BUN 26 (*)  Creatinine, Ser 1.15 (*)    GFR, Estimated 46 (*)    All other components within normal limits  GLUCOSE, CAPILLARY - Abnormal; Notable for the following components:   Glucose-Capillary 105 (*)    All other components within normal limits  GLUCOSE, CAPILLARY - Abnormal; Notable for the following components:   Glucose-Capillary 112 (*)    All other  components within normal limits  I-STAT CHEM 8, ED - Abnormal; Notable for the following components:   Potassium 2.9 (*)    Chloride 95 (*)    BUN 27 (*)    Creatinine, Ser 1.10 (*)    Glucose, Bld 145 (*)    Calcium , Ion 0.96 (*)    All other components within normal limits  I-STAT CG4 LACTIC ACID, ED - Abnormal; Notable for the following components:   Lactic Acid, Venous 2.3 (*)    All other components within normal limits  CULTURE, BLOOD (ROUTINE X 2)  CULTURE, BLOOD (ROUTINE X 2)  MRSA NEXT GEN BY PCR, NASAL  ETHANOL  PROTIME-INR  CK  HEMOGLOBIN A1C  TSH  LACTIC ACID, PLASMA  CBC  TYPE AND SCREEN  ABO/RH    EKG: None  Radiology: DG Humerus Right Result Date: 12/31/2023 CLINICAL DATA:  Right arm pain after fall. EXAM: RIGHT HUMERUS - 2+ VIEW COMPARISON:  None Available. FINDINGS: There is no evidence of fracture or other focal bone lesions. Soft tissues are unremarkable. IMPRESSION: Negative. Electronically Signed   By: Lynwood Landy Raddle M.D.   On: 12/31/2023 10:47   DG Hand Complete Right Result Date: 12/31/2023 CLINICAL DATA:  Right hand pain after fall. EXAM: RIGHT HAND - COMPLETE 3+ VIEW COMPARISON:  None Available. FINDINGS: There is no evidence of fracture or dislocation. There is no evidence of arthropathy or other focal bone abnormality. Soft tissues are unremarkable. IMPRESSION: Negative. Electronically Signed   By: Lynwood Landy Raddle M.D.   On: 12/31/2023 10:46   CT CHEST ABDOMEN PELVIS W CONTRAST Result Date: 12/31/2023 CLINICAL DATA:  Level 1 trauma. Polytrauma, blunt EXAM: CT CHEST, ABDOMEN, AND PELVIS WITH CONTRAST TECHNIQUE: Multidetector CT imaging of the chest, abdomen and pelvis was performed following the standard protocol during bolus administration of intravenous contrast. RADIATION DOSE REDUCTION: This exam was performed according to the departmental dose-optimization program which includes automated exposure control, adjustment of the mA and/or kV according  to patient size and/or use of iterative reconstruction technique. CONTRAST:  75mL OMNIPAQUE  IOHEXOL  350 MG/ML SOLN COMPARISON:  09/07/2023 chest CT and 06/15/2023 CT angiogram of the abdomen and pelvis. FINDINGS: CT CHEST FINDINGS Cardiovascular: Normal heart size. No significant pericardial fluid/thickening. Three vessel coronary atherosclerosis. Atherosclerotic nonaneurysmal thoracic aorta. Normal caliber pulmonary arteries. No evidence of acute thoracic aortic injury. No central pulmonary emboli. Mediastinum/Nodes: No pneumomediastinum. No mediastinal hematoma. No discrete thyroid nodules. Unremarkable esophagus. No axillary, mediastinal or hilar lymphadenopathy. Lungs/Pleura: No pneumothorax. No pleural effusion. Motion degraded images limiting assessment. Mild-to-moderate patchy tree-in-bud opacity and indistinct nodular foci of consolidation throughout the posterior lungs bilaterally, new from 09/07/2023 chest CT, largest 1.6 x 1.5 cm in the posterior right upper lobe on series 5/image 57 and 1.0 cm in the medial left upper lobe on series 5/image 55. Anterior right lower lobe solid 0.4 cm nodule on series 5/image 102 is stable. Musculoskeletal: No aggressive appearing focal osseous lesions. No fracture detected in the chest. Mild thoracic spondylosis. CT ABDOMEN PELVIS FINDINGS Hepatobiliary: Normal liver with no liver laceration or mass. Normal gallbladder with no radiopaque cholelithiasis. No  biliary ductal dilatation. Pancreas: Chronic diffuse pancreatic duct dilation up to 5 mm diameter, unchanged. Chronic hypodense 1.0 cm uncinate process pancreatic lesion on series 4/image 81 is unchanged from 06/15/2023 CT angiogram abdomen and pelvis study. No new pancreatic lesions. Spleen: Normal size. No laceration or mass. Adrenals/Urinary Tract: Normal adrenals. No hydronephrosis. No renal laceration. No renal mass. Normal bladder. Stomach/Bowel: Small hiatal hernia. Otherwise normal nondistended stomach. Normal  caliber small bowel with no small bowel wall thickening. Appendix not discretely visualized. Moderate left colonic diverticulosis with no large bowel wall thickening or acute pericolonic fat stranding. Vascular/Lymphatic: Atherosclerotic nonaneurysmal abdominal aorta. Patent portal, splenic, hepatic and renal veins. Right common femoral central venous catheter terminates in the right common iliac vein. No pathologically enlarged lymph nodes in the abdomen or pelvis. Reproductive: Status post hysterectomy, with no abnormal findings at the vaginal cuff. No adnexal mass. Other: No pneumoperitoneum, ascites or focal fluid collection. Musculoskeletal: No aggressive appearing focal osseous lesions. No fracture in the abdomen or pelvis. Moderate lumbar degenerative disc disease. IMPRESSION: 1. No evidence of acute traumatic injury in the chest, abdomen or pelvis. 2. Mild-to-moderate patchy tree-in-bud opacity and indistinct nodular foci of consolidation throughout the posterior lungs bilaterally, new from 09/07/2023 chest CT, largest 1.6 cm in the posterior right upper lobe. Findings are most compatible with an infectious or inflammatory bronchiolitis, favor aspiration. Suggest follow-up noncontrast chest CT in 3 months to ensure resolution. 3. Three vessel coronary atherosclerosis. 4. Small hiatal hernia. 5. Moderate left colonic diverticulosis. 6. Chronic diffuse pancreatic duct dilation up to 5 mm diameter, unchanged. Chronic hypodense 1.0 cm uncinate process pancreatic lesion is unchanged from 06/15/2023 CT angiogram abdomen and pelvis study. No new pancreatic lesions. Suggest follow-up noncontrast abdominal MRI in 1 year. 7.  Aortic Atherosclerosis (ICD10-I70.0). These results were discussed in person at the time of interpretation on 12/31/2023 at 10:40 am to provider Ozell Shaper PA, who verbally acknowledged these results. Electronically Signed   By: Selinda DELENA Blue M.D.   On: 12/31/2023 10:40   CT HEAD WO  CONTRAST Addendum Date: 12/31/2023 ADDENDUM REPORT: 12/31/2023 10:23 ADDENDUM: #1 discussed by telephone with Ozell Shaper PA on 12/31/2023 at 10:20 . Electronically Signed   By: VEAR Hurst M.D.   On: 12/31/2023 10:23   Result Date: 12/31/2023 CLINICAL DATA:  89 year old female level 1 trauma. Unwitnessed fall. On blood thinners. EXAM: CT HEAD WITHOUT CONTRAST TECHNIQUE: Contiguous axial images were obtained from the base of the skull through the vertex without intravenous contrast. RADIATION DOSE REDUCTION: This exam was performed according to the departmental dose-optimization program which includes automated exposure control, adjustment of the mA and/or kV according to patient size and/or use of iterative reconstruction technique. COMPARISON:  Head CT 11/03/2022. FINDINGS: Brain: Cerebral volume remains normal for age. Chronic extra-axial CSF over the frontal convexities. No midline shift, ventriculomegaly, mass effect, evidence of mass lesion, intracranial hemorrhage or evidence of cortically based acute infarction. Mild for age patchy cerebral white matter hypodensity. Chronic lacunar infarct medial right thalamus. Otherwise preserved gray-white differentiation. Vascular: Calcified atherosclerosis at the skull base. No suspicious intracranial vascular hyperdensity. Skull: No acute osseous abnormality identified. Sinuses/Orbits: No layering sinus fluid or hemorrhage identified. Tympanic cavities and mastoids well aerated. Mild new mucosal thickening left sphenoid sinus. Other: No discrete orbit or scalp soft tissue injury identified. IMPRESSION: 1. No acute intracranial abnormality or acute traumatic injury identified. 2. Chronic small vessel disease. Electronically Signed: By: VEAR Hurst M.D. On: 12/31/2023 10:18   CT CERVICAL SPINE WO  CONTRAST Result Date: 12/31/2023 CLINICAL DATA:  89 year old female level 1 trauma. Unwitnessed fall. On blood thinners. EXAM: CT CERVICAL SPINE WITHOUT CONTRAST TECHNIQUE:  Multidetector CT imaging of the cervical spine was performed without intravenous contrast. Multiplanar CT image reconstructions were also generated. RADIATION DOSE REDUCTION: This exam was performed according to the departmental dose-optimization program which includes automated exposure control, adjustment of the mA and/or kV according to patient size and/or use of iterative reconstruction technique. COMPARISON:  Head CT today.  Cervical spine CT 06/14/2016. FINDINGS: Alignment: Mild motion artifact today but improved cervical lordosis compared to 2017. Chronic degenerative appearing anterolisthesis of C4 on C5 and C7 on T1. Bilateral posterior element alignment appears stable. Skull base and vertebrae: Bone mineralization is within normal limits for age. Visualized skull base is intact. No atlanto-occipital dissociation. C1 and C2 appear intact and aligned. Intermittent motion artifact below C1. No acute osseous abnormality identified. Soft tissues and spinal canal: No prevertebral fluid or swelling. No visible canal hematoma. Bulky calcified cervical carotid atherosclerosis in the bilateral neck. Disc levels: Stable since 2017. Upper chest: Chest CT reported separately today. Visible upper thoracic levels appear intact. IMPRESSION: 1. No acute traumatic injury identified in the cervical spine. 2. Chronic cervical spine degeneration. 3. Bulky calcified cervical carotid atherosclerosis. #1 discussed by telephone with Michael Maczis PA on 12/31/2023 at 10:20 . Electronically Signed   By: VEAR Hurst M.D.   On: 12/31/2023 10:22   DG Pelvis Portable Result Date: 12/31/2023 CLINICAL DATA:  Fall. EXAM: PORTABLE PELVIS 1-2 VIEWS COMPARISON:  None Available. FINDINGS: There is no evidence of pelvic fracture or diastasis. No pelvic bone lesions are seen. IMPRESSION: No acute abnormality seen. Electronically Signed   By: Lynwood Landy Raddle M.D.   On: 12/31/2023 09:55   DG Chest Port 1 View Result Date: 12/31/2023 CLINICAL  DATA:  Fall. EXAM: PORTABLE CHEST 1 VIEW COMPARISON:  June 12, 2023. FINDINGS: The heart size and mediastinal contours are within normal limits. Both lungs are clear. The visualized skeletal structures are unremarkable. IMPRESSION: No active disease. Electronically Signed   By: Lynwood Landy Raddle M.D.   On: 12/31/2023 09:54     Procedures  EMERGENCY DEPARTMENT US  FAST EXAM Limited Ultrasound of the Abdomen and Pericardium (FAST Exam).   INDICATIONS:Abnornal vitals Multiple views of the abdomen and pericardium are obtained with a multi-frequency probe.  PERFORMED BY: Myself IMAGES ARCHIVED?: Yes LIMITATIONS:  Body habitus INTERPRETATION:  No abdominal free fluid and No pericardial effusion  EMERGENCY DEPARTMENT US  LUNG EXAM Study: Limited Ultrasound of the Lung and Thorax  INDICATIONS: Abnormal vitals Multiple views of both lungs using sagittal orientation were obtained.  PERFORMED BY: Myself IMAGES ARCHIVED?: Yes LIMITATIONS: None VIEWS USED: Anterior lung fields INTERPRETATION: No pneumothorax   Medications Ordered in the ED  norepinephrine  (LEVOPHED ) 4mg  in (0.016 mg/mL) premix infusion (0 mcg/min Intravenous Stopped 12/31/23 1504)  docusate sodium  (COLACE) capsule 100 mg (has no administration in time range)  polyethylene glycol (MIRALAX  / GLYCOLAX ) packet 17 g (has no administration in time range)  acetaminophen  (TYLENOL ) tablet 650 mg (650 mg Oral Given 01/01/24 0529)  ondansetron  (ZOFRAN ) injection 4 mg (has no administration in time range)  insulin  aspart (novoLOG ) injection 0-15 Units (5 Units Subcutaneous Given 12/31/23 1714)  insulin  aspart (novoLOG ) injection 0-5 Units ( Subcutaneous Not Given 12/31/23 2256)  arformoterol  (BROVANA ) nebulizer solution 15 mcg (15 mcg Nebulization Given 12/31/23 1918)  revefenacin  (YUPELRI ) nebulizer solution 175 mcg (175 mcg Nebulization Not Given 12/31/23 1406)  ipratropium-albuterol  (DUONEB) 0.5-2.5 (3) MG/3ML nebulizer  solution 3 mL (has no administration in time range)  pramipexole  (MIRAPEX ) tablet 0.25 mg (0.25 mg Oral Given 12/31/23 2104)  levothyroxine  (SYNTHROID ) tablet 75 mcg (75 mcg Oral Given 01/01/24 0529)  piperacillin -tazobactam (ZOSYN ) IVPB 3.375 g ( Intravenous Infusion Verify 01/01/24 0800)  vancomycin  (VANCOREADY) IVPB 1500 mg/300 mL (has no administration in time range)  Chlorhexidine  Gluconate Cloth 2 % PADS 6 each (6 each Topical Given 12/31/23 1320)  methylPREDNISolone  sodium succinate (SOLU-MEDROL ) 40 mg/mL injection 40 mg (has no administration in time range)  gabapentin  (NEURONTIN ) capsule 500 mg (500 mg Oral Given 12/31/23 2104)  HYDROmorphone  (DILAUDID ) injection 0.5 mg (0.5 mg Intravenous Given 01/01/24 0552)  oxyCODONE  (Oxy IR/ROXICODONE ) immediate release tablet 10 mg (10 mg Oral Given 12/31/23 2104)  Oral care mouth rinse (has no administration in time range)  iohexol  (OMNIPAQUE ) 350 MG/ML injection 75 mL (75 mLs Intravenous Contrast Given 12/31/23 1010)  ceFEPIme  (MAXIPIME ) 2 g in sodium chloride  0.9 % 100 mL IVPB (0 g Intravenous Stopped 12/31/23 1308)  metroNIDAZOLE  (FLAGYL ) IVPB 500 mg (0 mg Intravenous Stopped 12/31/23 1309)  vancomycin  (VANCOREADY) IVPB 1500 mg/300 mL (0 mg Intravenous Stopped 12/31/23 1324)  ipratropium-albuterol  (DUONEB) 0.5-2.5 (3) MG/3ML nebulizer solution 6 mL (6 mLs Nebulization Given 12/31/23 1130)  methylPREDNISolone  sodium succinate (SOLU-MEDROL ) 125 mg/2 mL injection 125 mg (125 mg Intravenous Given 12/31/23 1130)  HYDROmorphone  (DILAUDID ) injection 0.5 mg (0.5 mg Intravenous Given 12/31/23 1156)  ondansetron  (ZOFRAN ) injection 4 mg (4 mg Intravenous Given 12/31/23 1154)  oxidized cellulose (Surgicel) pad 1 each (1 each Topical Given 12/31/23 1327)  potassium chloride  10 mEq in 100 mL IVPB (0 mEq Intravenous Stopped 12/31/23 1725)  potassium chloride  SA (KLOR-CON  M) CR tablet 40 mEq (40 mEq Oral Given 12/31/23 1311)  calcium  gluconate 2 g/ 100 mL sodium chloride  IVPB  (0 mg Intravenous Stopped 12/31/23 1608)  oxidized cellulose (Surgicel) pad 1 each (1 each Topical Given 01/01/24 0544)    Clinical Course as of 01/01/24 0822  Fri Dec 31, 2023  1037 Dr. Charm and trauma surgery have cleared the patient.  No traumatic injuries.  Recommend admission to critical care. [RP]  1048 Possible lung infection on the CT of the chest abdomen pelvis [RP]  1121 Dw whitney harris from [facility] who will eval the patient [RP]    Clinical Course User Index [RP] Yolande Lamar BROCKS, MD                                 Medical Decision Making Amount and/or Complexity of Data Reviewed Labs: ordered. Radiology: ordered.  Risk Prescription drug management. Decision regarding hospitalization.   89 year old female on Brilinta  who presents to the emergency department after an unwitnessed fall.   Initial Ddx:  TBI, C-spine injury, traumatic thoracic/intra-abdominal injury, sepsis, hypovolemia  MDM/Course:  Patient presents emergency department after falling out of her bed.  Was unwitnessed but patient is able to provide a cohesive history.  Did not appear to be sick or having any other symptoms prior to this.  When EMS arrived she was hypotensive and made a level 1 trauma.  On arrival was tachycardic to the low 100s with a blood pressure as low as 60/40.  Was given IV fluids and then massive transfusion protocol was initiated by trauma.  E-FAST negative.  She received 3 L of crystalloid and 3 units of blood.  Upon re-evaluation blood pressure transiently improved and  then she became hypotensive again.  She was started on Levophed  at that point in time.  She underwent trauma pan scans which did not show any acute injuries.  Did show patchy infiltrates in her legs that may represent pneumonia.  Urinalysis also consistent with possible UTI.  She was started on broad-spectrum antibiotics and was admitted to critical care for further management.  Did start developing some wheezing and was  given steroids and DuoNebs.  Will need to be monitored for transfusion associated cardiac overload and lung injury.  This patient presents to the ED for concern of complaints listed in HPI, this involves an extensive number of treatment options, and is a complaint that carries with it a high risk of complications and morbidity. Disposition including potential need for admission considered.   Dispo: ICU  Additional history obtained from EMS Records reviewed Outpatient Clinic Notes The following labs were independently interpreted: Chemistry and show AKI I independently reviewed the following imaging with scope of interpretation limited to determining acute life threatening conditions related to emergency care: CT Head and agree with the radiologist interpretation with the following exceptions: none I personally reviewed and interpreted cardiac monitoring: sinus tachycardia I personally reviewed and interpreted the pt's EKG: see above for interpretation  I have reviewed the patients home medications and made adjustments as needed Consults: Critical care and Trauma Surgery Social Determinants of health:  Geriatric  Portions of this note were generated with Scientist, clinical (histocompatibility and immunogenetics). Dictation errors may occur despite best attempts at proofreading.    CRITICAL CARE Performed by: Lamar JAYSON Shan   Total critical care time: 45 minutes  Critical care time was exclusive of separately billable procedures and treating other patients.  Critical care was necessary to treat or prevent imminent or life-threatening deterioration.  Critical care was time spent personally by me on the following activities: development of treatment plan with patient and/or surrogate as well as nursing, discussions with consultants, evaluation of patient's response to treatment, examination of patient, obtaining history from patient or surrogate, ordering and performing treatments and interventions, ordering and review  of laboratory studies, ordering and review of radiographic studies, pulse oximetry and re-evaluation of patient's condition.   Final diagnoses:  Fall, initial encounter  Injury of head, initial encounter  Shock [facility])  Wheezing    ED Discharge Orders     None          Shan Lamar JAYSON, MD 01/01/24 [phone]

## 2023-12-31 NOTE — Progress Notes (Signed)
 Pharmacy Antibiotic Note  Amy Mckay is a 89 y.o. female admitted on 12/31/2023 with concern for septic shock and requiring pressors in ED. Prior to admission, pt was getting levaquin  from facility (7/15-7/19). Pharmacy has been consulted for vancomycin  dosing. Zosyn  per MD.   Plan: Vancomycin  1500mg  load > 1500mg  q48h (eAUC 511, Scr 1.1)  F/u renal function, infectious work up and length of therapy Vancomycin  levels as needed  Height: 5' 2 (157.5 cm) Weight: 72.6 kg (160 lb) IBW/kg (Calculated) : 50.1  Temp (24hrs), Avg:97.9 F (36.6 C), Min:97.8 F (36.6 C), Max:98 F (36.7 C)  Recent Labs  Lab 12/31/23 1040 12/31/23 1103  WBC 22.4*  --   CREATININE 1.11* 1.10*  LATICACIDVEN  --  2.3*    Estimated Creatinine Clearance: 33.6 mL/min (A) (by C-G formula based on SCr of 1.1 mg/dL (H)).    Allergies  Allergen Reactions   Penicillins Other (See Comments)    Unknown reaction    Antimicrobials this admission: Vancomycin  7/18 > Zosyn  7/18 >  Microbiology results: 7/18 bcx: 7/18 mrsa pcr:  Thank you for allowing pharmacy to be a part of this patient's care.  Leonor GORMAN Bash 12/31/2023 12:01 PM

## 2023-12-31 NOTE — Progress Notes (Signed)
 Transition of Care [facility]) - CAGE-AID Screening   Patient Details  Name: Amy Mckay MRN: [id number] Date of Birth: June 27, 1934   [name], RN Trauma Response Nurse Phone Number: [phone] 12/31/2023, 12:01 PM   CAGE-AID Screening:    Have You Ever Felt You Ought to Cut Down on Your Drinking or Drug Use?: No Have People Annoyed You By Critizing Your Drinking Or Drug Use?: No Have You Felt Bad Or Guilty About Your Drinking Or Drug Use?: No Have You Ever Had a Drink or Used Drugs First Thing In The Morning to Steady Your Nerves or to Get Rid of a Hangover?: No CAGE-AID Score: 0  Substance Abuse Education Offered: (S) No (Resident in a nursing facility- denies drinking or drug use.)

## 2023-12-31 NOTE — H&P (Signed)
 NAME:  Amy Mckay, MRN:  [id number], DOB:  04-11-1935, LOS: 0 ADMISSION DATE:  12/31/2023, CONSULTATION DATE:  7/18 REFERRING MD:  Le, CHIEF COMPLAINT:  hypotension   History of Present Illness:  Amy Mckay is an 89 y/o woman with a history of COPD on 3L O2, chronic anemia due to rectal bleeding 2/2 prolapse who presented after falling out of bed.  Her fall was unwitnessed but she denies head trauma. She is on Brilinta  for history of SMA stent and was a trauma activation. She had active bleeding from her RUE but otherwise did not have traumatic injuries.  Despite getting 3 units of whole blood she continued to have hypotension was started on norepinephrine .  Central venous catheter placed in femoral vein in the emergency department.  She has been at rehab at a facility recently and reports progressive weakness over the last several days.  She has had some urinary frequency and burning. She has baseline cough and wheezing. She denies other complaints.   Pertinent  Medical History  Chronic rectal bleeding due to prolapse COPD on 3L O2 GERD Hypothyroidism Hypertension PAD w/ SMA stent (reason for Brilinta )  pulmonary nodules  Significant Hospital Events: Including procedures, antibiotic start and stop dates in addition to other pertinent events   7/18 admitted, 3 units whole blood during trauma activation. Fem CVC placed, norepi, started on antibiotics.   Interim History / Subjective:    Objective    Blood pressure 101/76, pulse 68, temperature 97.8 F (36.6 C), temperature source Oral, resp. rate (!) 21, height 5' 2 (1.575 m), weight 72.6 kg, SpO2 95%.        Intake/Output Summary (Last 24 hours) at 12/31/2023 1228 Last data filed at 12/31/2023 1128 Gross per 24 hour  Intake --  Output 1550 ml  Net -1550 ml   Filed Weights   12/31/23 1034 12/31/23 1123  Weight: 68 kg 72.6 kg    Examination: General: frail appearing elderly woman lying in bed in NAD HENT: [place]/AT,  eyes anicteric Lungs: wheezing bilaterally, no conversational dyspnea or accessory muscle use. On 3L O2.  Cardiovascular: S1S2, tachycardic Abdomen: obese, soft, NT Extremities: RLE> LLE edema, R femoral CVC Neuro: awake, alert, short attention span, but answering questions.  GU: purewick with amber urine  LA 2.3 WBC 22.4 H/H 11/33.8 Platelets 295 ETOH <15   Resolved problem list   Assessment and Plan   Shock, concern for septic. No compelling evidence of active hemorrhage to explain hemorrhagic shock.  -norepi to maintain MAP >65 -antibiotics-- vanc, zosyn  -blood cultures, UA, collect trach aspirate  Chronic respiratory failure with hypoxia due to COPD -steroids -brovana , yupelri  -duonebs PRN -con't supplemental O2 to maintain SpO2> 90%  Hypokalemia -replete -monitor  RLE edema -RLE US   Anemia, chronic -transfuse for Hb <7 or hemodynamically significant bleeding  Hematoma RUE -compressive dressing  Bleeding from CVC; unfortunately on antiplatelets -RN instructed to hold pressure -hemostatic dressing ordered   Hyperglycemia -SSI PRN -goal BG 140-180 -check A1c  Hypothyroidism -synthroid   Called daughter [name]-- wrong number, unable to leave a message. Granddaughter Meghan called-- wrong number.  Best Practice (right click and Reselect all SmartList Selections daily)   Diet/type: Regular consistency (see orders) DVT prophylaxis not indicated Pressure ulcer(s): defer to RN assessment GI prophylaxis: N/A Lines: Central line Foley:  N/A Code Status:  DNR Last date of multidisciplinary goals of care discussion [no family at bedside]  Labs   CBC: Recent Labs  Lab 12/31/23 1040 12/31/23 1103  WBC 22.4*  --   HGB 11.0* 12.2  HCT 33.8* 36.0  MCV 89.2  --   PLT 295  --     Basic Metabolic Panel: Recent Labs  Lab 12/31/23 1040 12/31/23 1103  NA 137 138  K 3.0* 2.9*  CL 96* 95*  CO2 25  --   GLUCOSE 145* 145*  BUN 26* 27*   CREATININE 1.11* 1.10*  CALCIUM  8.2*  --    GFR: Estimated Creatinine Clearance: 33.6 mL/min (A) (by C-G formula based on SCr of 1.1 mg/dL (H)). Recent Labs  Lab 12/31/23 1040 12/31/23 1103  WBC 22.4*  --   LATICACIDVEN  --  2.3*    Liver Function Tests: Recent Labs  Lab 12/31/23 1040  AST 36  ALT 23  ALKPHOS 63  BILITOT 0.7  PROT 6.5  ALBUMIN 2.8*   No results for input(s): LIPASE, AMYLASE in the last 168 hours. No results for input(s): AMMONIA in the last 168 hours.  ABG    Component Value Date/Time   TCO2 29 12/31/2023 1103     Coagulation Profile: Recent Labs  Lab 12/31/23 1040  INR 1.1    Cardiac Enzymes: Recent Labs  Lab 12/31/23 1040  CKTOTAL 139    HbA1C: No results found for: HGBA1C  CBG: No results for input(s): GLUCAP in the last 168 hours.  Review of Systems:   Review of Systems  Constitutional:  Negative for fever.  Respiratory:  Positive for wheezing.   Cardiovascular: Negative.   Gastrointestinal: Negative.   Genitourinary:  Positive for dysuria and frequency.  Skin:  Negative for rash.  Neurological:  Positive for weakness.  Endo/Heme/Allergies:  Bruises/bleeds easily.     Past Medical History:  She,  has a past medical history of COPD (chronic obstructive pulmonary disease) (HCC), GERD (gastroesophageal reflux disease), and Hypertension.   Surgical History:   Past Surgical History:  Procedure Laterality Date   COLONOSCOPY     ESOPHAGOGASTRODUODENOSCOPY       Social History:   reports that she has quit smoking. Her smoking use included cigarettes. She has never used smokeless tobacco.   Family History:  Her family history is not on file.   Allergies Allergies  Allergen Reactions   Penicillins Other (See Comments)    Unknown reaction     Home Medications  Prior to Admission medications   Medication Sig Start Date End Date Taking? Authorizing Provider  acetaminophen  (TYLENOL ) 500 MG tablet Take  1,000 mg by mouth 3 (three) times daily.   Yes [provider]  ascorbic acid  (VITAMIN C) 500 MG tablet Take 500 mg by mouth daily.   Yes [provider]  aspirin  EC 81 MG tablet Take 81 mg by mouth daily.   Yes [provider]  atorvastatin  (LIPITOR ) 80 MG tablet Take 80 mg by mouth at bedtime.   Yes [provider]  budesonide-glycopyrrolate-formoterol  (BREZTRI  AEROSPHERE) 160-9-4.8 MCG/ACT AERO inhaler Inhale 2 puffs into the lungs 2 (two) times daily.   Yes [provider]  Calcium  Carb-Cholecalciferol  (CALCIUM  + VITAMIN D3) 600-5 MG-MCG TABS Take 1 tablet by mouth daily at 12 noon.   Yes [provider]  dextromethorphan (DELSYM) 30 MG/5ML liquid Take 60 mg by mouth every 12 (twelve) hours as needed for cough.   Yes [provider]  DULoxetine  (CYMBALTA ) 60 MG capsule Take 60 mg by mouth daily.   Yes [provider]  ferrous sulfate 220 (44 Fe) MG/5ML solution Take 308 mg by mouth daily.  Yes [provider]  fluticasone  OREN ALLERGY RELIEF) 50 MCG/ACT nasal spray Place 1 spray into both nostrils daily.   Yes [provider]  gabapentin  (NEURONTIN ) 100 MG capsule Take 100 mg by mouth 3 (three) times daily. Give with 400mg  capsules to equal 500mg  three times daily.   Yes [provider]  gabapentin  (NEURONTIN ) 400 MG capsule Take 400 mg by mouth 3 (three) times daily. Give with 100mg  capsules to equal 500mg  three times daily.   Yes [provider]  guaiFENesin  (ROBITUSSIN) 100 MG/5ML liquid Take 200 mg by mouth 3 (three) times daily.   Yes [provider]  ipratropium-albuterol  (DUONEB) 0.5-2.5 (3) MG/3ML SOLN Take 3 mLs by nebulization 3 (three) times daily.   Yes [provider]  levofloxacin  (LEVAQUIN ) 500 MG tablet Take 500 mg by mouth at bedtime.   Yes [provider]  levothyroxine  (SYNTHROID ) 75 MCG tablet Take 75 mcg by mouth daily before breakfast.    Yes [provider]  memantine  (NAMENDA ) 10 MG tablet Take 10 mg by mouth 2 (two) times daily.   Yes [provider]  Menthol, Topical Analgesic, 4 % GEL Apply 1 application  topically 3 (three) times daily. Biofreeze. Apply to left shoulder.   Yes [provider]  metoprolol  succinate (TOPROL -XL) 25 MG 24 hr tablet Take 25 mg by mouth daily.   Yes [provider]  Miconazole POWD Apply 1 application  topically See admin instructions. Apply a thin dusting to groin area twice a day between meals as needed for rash.   Yes [provider]  NONFORMULARY OR COMPOUNDED ITEM Apply 1 application  topically See admin instructions. Diltiazem 2%/Lidocaine  2% compounded solution. Apply a thin layer to rectal tissue twice daily for pain.   Yes [provider]  ondansetron  (ZOFRAN -ODT) 4 MG disintegrating tablet Take 4 mg by mouth 3 (three) times daily as needed for nausea or vomiting.   Yes [provider]  oxyCODONE  (ROXICODONE ) 15 MG immediate release tablet Take 15 mg by mouth See admin instructions. Give 1 tablet (15mg ) by mouth daily as needed for moderate to severe pain between the hours of 0000-0400.   Yes [provider]  Oxycodone  HCl 10 MG TABS Take 10 mg by mouth 4 (four) times daily.   Yes [provider]  pantoprazole  (PROTONIX ) 40 MG tablet Take 40 mg by mouth 2 (two) times daily.   Yes [provider]  phenylephrine -shark liver oil-mineral oil-petrolatum (PREPARATION H) 0.25-14-74.9 % rectal ointment Place 1 Application rectally 4 (four) times daily.   Yes [provider]  polyethylene glycol powder (GLYCOLAX /MIRALAX ) 17 GM/SCOOP powder Take 17 g by mouth daily.   Yes [provider]  pramipexole  (MIRAPEX ) 0.25 MG tablet Take 0.25 mg by mouth 2 (two) times daily.   Yes [provider]  promethazine (PHENERGAN) 12.5 MG tablet Take 12.5 mg by mouth every 8 (eight) hours as needed for  nausea.   Yes [provider]  sacubitril -valsartan  (ENTRESTO ) 24-26 MG Take 1 tablet by mouth 2 (two) times daily.   Yes [provider]  sennosides-docusate sodium  (SENOKOT-S) 8.6-50 MG tablet Take 1 tablet by mouth daily.   Yes [provider]  sodium chloride  (OCEAN) 0.65 % SOLN nasal spray Place 1 spray into both nostrils 3 (three) times daily.   Yes [provider]  sucralfate  (CARAFATE ) 1 g tablet Take 1 g by mouth 4 (four) times daily -  with meals and at bedtime.   Yes  [provider]  ticagrelor  (BRILINTA ) 90 MG TABS tablet Take 90 mg by mouth 2 (two) times daily.   Yes [provider]  torsemide (DEMADEX) 10 MG tablet Take 10 mg by mouth daily. Give with 20mg  tablet to equal 30mg  daily.   Yes [provider]  torsemide (DEMADEX) 20 MG tablet Take 20 mg by mouth daily. Give with 10mg  tablet to equal 30mg  daily.   Yes [provider]     Critical care time:       This patient is critically ill with multiple organ system failure which requires frequent high complexity decision making, assessment, support, evaluation, and titration of therapies. This was completed through the application of advanced monitoring technologies and extensive interpretation of multiple databases. During this encounter critical care time was devoted to patient care services described in this note for 40 minutes.  Leita SHAUNNA Gaskins, DO 12/31/23 12:28 PM [facility]  For contact information, see Amion. If no response to pager, please call PCCM consult pager. After hours, 7PM- 7AM, please call Elink.

## 2023-12-31 NOTE — Progress Notes (Signed)
 Pt was found down on floor Nursing home. Chaplain provided emotional and spiritual support.  Chaplain available as needed.  Rayleen Dade, [place], [facility], Pager [phone]

## 2023-12-31 NOTE — Procedures (Signed)
   Procedure Note  Date: 12/31/2023  Procedure: central venous catheter placement--right, femoral vein, with ultrasound guidance  Pre-op diagnosis: inadequate IV access, unable to obtain additional peripheral access Post-op diagnosis: same  Surgeon: Dreama GEANNIE Hanger, MD  Anesthesia: local  EBL: <5cc Drains/Implants: 20 cm, triple lumen central venous catheter  Description of procedure: Time-out was performed verifying correct patient, procedure, site, laterality, and signature of informed consent. The right groin was prepped and draped in the usual sterile fashion. The right femoral vein was localized with ultrasound guidance. Five ccs of local anesthetic was infiltrated at the site of venous access. The right femoral vein was accessed using an introducer needle and a guidewire passed through the needle. The needle was removed and a skin nick was made. The tract was dilated and the central venous catheter advanced over the guidewire followed by removal of the guidewire. All ports drew blood easily and all were flushed with saline. The catheter was secured to the skin with suture and a sterile dressing. The patient tolerated the procedure well. There were no immediate complications.   Dreama GEANNIE Hanger, MD General and Trauma Surgery [facility]

## 2023-12-31 NOTE — ED Notes (Signed)
 Trauma Response Nurse Documentation   Amy Mckay is a 89 y.o. female arriving to [facility] ED via [facility]  EMS  On Brilinta  (ticagrelor ) 90 mg bid. Trauma was activated as a Level 1 by Charge RN based on the following trauma criteria Anytime Systolic Blood Pressure < 90.  Patient cleared for CT by Dr. Paola. Pt transported to CT with trauma response nurse present to monitor. RN remained with the patient throughout their absence from the department for clinical observation.   GCS 15.  Trauma MD Arrival Time: 81.  History   History reviewed. No pertinent past medical history.        Initial Focused Assessment (If applicable, or please see trauma documentation): Airway - clear Breathing-Lungs - diminished in bases, was getting a breathing treatment on arrival for wheezing Circulation - unable to palpate peripheral pulses, Dr. Paola able to Find with doppler.  GCS - 15  CT's Completed:   CT Head, CT C-Spine, CT Chest w/ contrast, and CT abdomen/pelvis w/ contrast   Interventions:  3 Units Whole Blood -- Emergency release Xrays CT scans Labs Central line placement per Dr. Paola   Plan for disposition:  Admission to ICU   Consults completed:  none  Pt admitted to CCM -no traumatic injuries identified  Event Summary:  Unwitnessed fall at the facility where she lives, found on floor with a BP of 68/ P per EMS. On arrival - BP unable to auscultate manual BP -- initial BP 60/40 per monitor.  Difficult IV stick- EMS obtained 22 in right wrist - Due to lack of IV access and inability to obtain 2nd peripheral line, Dr. Paola placed triple lumen central line in right femoral.  Transported to CT and back to room with primary RN and this TRN.   Darice HERO Costantino Kohlbeck  Trauma Response RN  Please call TRN at [phone] for further assistance.

## 2023-12-31 NOTE — ED Notes (Signed)
Collar removed by EDP

## 2023-12-31 NOTE — Consult Note (Signed)
 Amy Mckay 1935-06-15  [id number].    Requesting MD: Dr. Lamar Shan Chief Complaint/Reason for Consult: Level 1 trauma, fall  HPI: Amy Mckay is a 89 y.o. female with multiple medical problems who presented as a level 1 trauma from SNF via EMS after a fall. GCS 15 on arrival. Airway intact. Breathing intact. Extremity pulses dopplerable x 4. Central line placed for access. Noted hypotension during workup and was given blood transfusion. She reports generalized pain, most in right upper arm. She is on Brilinta  at baseline and took this along with all of her home meds this morning. Reports she has been having bloody bm's daily for > 1 week. CXR and Pelvic xray done in trauma bay. She was taken to CT for further workup.   ROS: Review of Systems  Unable to perform ROS: Acuity of condition    History reviewed. No pertinent family history.  History reviewed. No pertinent past medical history.   Social History:  reports that she has never smoked. She has never used smokeless tobacco. No history on file for alcohol use and drug use.  Allergies:  Allergies  Allergen Reactions   Penicillins Other (See Comments)    Unknown reaction    (Not in a hospital admission)    Physical Exam: Blood pressure 112/89, pulse (!) 109, temperature 98 F (36.7 C), temperature source Temporal, resp. rate 19, weight 68 kg, SpO2 97%. General: pleasant, WD/WN female who is laying in bed  HEENT: No raccoon eyes or battle signs. Sclera are non-icteric. Ears and nose without any obvious masses or lesions.  Mouth is pink and moist. Dentition fair Neck: C-Collar in place Heart: Tachycardic  Lungs: Wheezing b/l. On  Abd:  Soft, ND, NT, +BS. No masses, hernias, or organomegaly GU: no blood in rectal vault  MS: Right upper arm bruising and ttp. R hand ttp. Otherwise NT to BUE and BLE. No thoracic or lumbar ttp or step offs. Skin: warm and dry  Neuro: GCS 15  Results for orders placed or  performed during the hospital encounter of 12/31/23 (from the past 48 hours)  Type and screen Ordered by PROVIDER DEFAULT     Status: None (Preliminary result)   Collection Time: 12/31/23  9:41 AM  Result Value Ref Range   ABO/RH(D) PENDING    Antibody Screen PENDING    Sample Expiration 01/03/2024,2359    Unit Number [id number]    Blood Component Type LOW TITER WHOLE BLOOD    Unit division 00    Status of Unit ISSUED    Unit tag comment EMERGENCY RELEASE    Transfusion Status OK TO TRANSFUSE    Crossmatch Result PENDING    Unit Number [id number]    Blood Component Type LOW TITER WHOLE BLOOD    Unit division 00    Status of Unit ISSUED    Unit tag comment EMERGENCY RELEASE    Transfusion Status OK TO TRANSFUSE    Crossmatch Result PENDING    Unit Number [id number]    Blood Component Type LOW TITER WHOLE BLOOD    Unit division 00    Status of Unit ISSUED    Unit tag comment EMERGENCY RELEASE    Transfusion Status      OK TO TRANSFUSE Performed at [facility], [address], [place], [place] [address]    Crossmatch Result PENDING    DG Humerus Right Result Date: 12/31/2023 CLINICAL DATA:  Right arm pain after fall. EXAM: RIGHT HUMERUS - 2+  VIEW COMPARISON:  None Available. FINDINGS: There is no evidence of fracture or other focal bone lesions. Soft tissues are unremarkable. IMPRESSION: Negative. Electronically Signed   By: Lynwood Landy Raddle M.D.   On: 12/31/2023 10:47   DG Hand Complete Right Result Date: 12/31/2023 CLINICAL DATA:  Right hand pain after fall. EXAM: RIGHT HAND - COMPLETE 3+ VIEW COMPARISON:  None Available. FINDINGS: There is no evidence of fracture or dislocation. There is no evidence of arthropathy or other focal bone abnormality. Soft tissues are unremarkable. IMPRESSION: Negative. Electronically Signed   By: Lynwood Landy Raddle M.D.   On: 12/31/2023 10:46   CT CHEST ABDOMEN PELVIS W CONTRAST Result Date: 12/31/2023 CLINICAL DATA:  Level 1 trauma.  Polytrauma, blunt EXAM: CT CHEST, ABDOMEN, AND PELVIS WITH CONTRAST TECHNIQUE: Multidetector CT imaging of the chest, abdomen and pelvis was performed following the standard protocol during bolus administration of intravenous contrast. RADIATION DOSE REDUCTION: This exam was performed according to the departmental dose-optimization program which includes automated exposure control, adjustment of the mA and/or kV according to patient size and/or use of iterative reconstruction technique. CONTRAST:  75mL OMNIPAQUE  IOHEXOL  350 MG/ML SOLN COMPARISON:  09/07/2023 chest CT and 06/15/2023 CT angiogram of the abdomen and pelvis. FINDINGS: CT CHEST FINDINGS Cardiovascular: Normal heart size. No significant pericardial fluid/thickening. Three vessel coronary atherosclerosis. Atherosclerotic nonaneurysmal thoracic aorta. Normal caliber pulmonary arteries. No evidence of acute thoracic aortic injury. No central pulmonary emboli. Mediastinum/Nodes: No pneumomediastinum. No mediastinal hematoma. No discrete thyroid nodules. Unremarkable esophagus. No axillary, mediastinal or hilar lymphadenopathy. Lungs/Pleura: No pneumothorax. No pleural effusion. Motion degraded images limiting assessment. Mild-to-moderate patchy tree-in-bud opacity and indistinct nodular foci of consolidation throughout the posterior lungs bilaterally, new from 09/07/2023 chest CT, largest 1.6 x 1.5 cm in the posterior right upper lobe on series 5/image 57 and 1.0 cm in the medial left upper lobe on series 5/image 55. Anterior right lower lobe solid 0.4 cm nodule on series 5/image 102 is stable. Musculoskeletal: No aggressive appearing focal osseous lesions. No fracture detected in the chest. Mild thoracic spondylosis. CT ABDOMEN PELVIS FINDINGS Hepatobiliary: Normal liver with no liver laceration or mass. Normal gallbladder with no radiopaque cholelithiasis. No biliary ductal dilatation. Pancreas: Chronic diffuse pancreatic duct dilation up to 5 mm diameter,  unchanged. Chronic hypodense 1.0 cm uncinate process pancreatic lesion on series 4/image 81 is unchanged from 06/15/2023 CT angiogram abdomen and pelvis study. No new pancreatic lesions. Spleen: Normal size. No laceration or mass. Adrenals/Urinary Tract: Normal adrenals. No hydronephrosis. No renal laceration. No renal mass. Normal bladder. Stomach/Bowel: Small hiatal hernia. Otherwise normal nondistended stomach. Normal caliber small bowel with no small bowel wall thickening. Appendix not discretely visualized. Moderate left colonic diverticulosis with no large bowel wall thickening or acute pericolonic fat stranding. Vascular/Lymphatic: Atherosclerotic nonaneurysmal abdominal aorta. Patent portal, splenic, hepatic and renal veins. Right common femoral central venous catheter terminates in the right common iliac vein. No pathologically enlarged lymph nodes in the abdomen or pelvis. Reproductive: Status post hysterectomy, with no abnormal findings at the vaginal cuff. No adnexal mass. Other: No pneumoperitoneum, ascites or focal fluid collection. Musculoskeletal: No aggressive appearing focal osseous lesions. No fracture in the abdomen or pelvis. Moderate lumbar degenerative disc disease. IMPRESSION: 1. No evidence of acute traumatic injury in the chest, abdomen or pelvis. 2. Mild-to-moderate patchy tree-in-bud opacity and indistinct nodular foci of consolidation throughout the posterior lungs bilaterally, new from 09/07/2023 chest CT, largest 1.6 cm in the posterior right upper lobe. Findings are most compatible  with an infectious or inflammatory bronchiolitis, favor aspiration. Suggest follow-up noncontrast chest CT in 3 months to ensure resolution. 3. Three vessel coronary atherosclerosis. 4. Small hiatal hernia. 5. Moderate left colonic diverticulosis. 6. Chronic diffuse pancreatic duct dilation up to 5 mm diameter, unchanged. Chronic hypodense 1.0 cm uncinate process pancreatic lesion is unchanged from  06/15/2023 CT angiogram abdomen and pelvis study. No new pancreatic lesions. Suggest follow-up noncontrast abdominal MRI in 1 year. 7.  Aortic Atherosclerosis (ICD10-I70.0). These results were discussed in person at the time of interpretation on 12/31/2023 at 10:40 am to provider Ozell Shaper PA, who verbally acknowledged these results. Electronically Signed   By: Selinda DELENA Blue M.D.   On: 12/31/2023 10:40   CT HEAD WO CONTRAST Addendum Date: 12/31/2023 ADDENDUM REPORT: 12/31/2023 10:23 ADDENDUM: #1 discussed by telephone with Ozell Shaper PA on 12/31/2023 at 10:20 . Electronically Signed   By: VEAR Hurst M.D.   On: 12/31/2023 10:23   Result Date: 12/31/2023 CLINICAL DATA:  89 year old female level 1 trauma. Unwitnessed fall. On blood thinners. EXAM: CT HEAD WITHOUT CONTRAST TECHNIQUE: Contiguous axial images were obtained from the base of the skull through the vertex without intravenous contrast. RADIATION DOSE REDUCTION: This exam was performed according to the departmental dose-optimization program which includes automated exposure control, adjustment of the mA and/or kV according to patient size and/or use of iterative reconstruction technique. COMPARISON:  Head CT 11/03/2022. FINDINGS: Brain: Cerebral volume remains normal for age. Chronic extra-axial CSF over the frontal convexities. No midline shift, ventriculomegaly, mass effect, evidence of mass lesion, intracranial hemorrhage or evidence of cortically based acute infarction. Mild for age patchy cerebral white matter hypodensity. Chronic lacunar infarct medial right thalamus. Otherwise preserved gray-white differentiation. Vascular: Calcified atherosclerosis at the skull base. No suspicious intracranial vascular hyperdensity. Skull: No acute osseous abnormality identified. Sinuses/Orbits: No layering sinus fluid or hemorrhage identified. Tympanic cavities and mastoids well aerated. Mild new mucosal thickening left sphenoid sinus. Other: No discrete orbit  or scalp soft tissue injury identified. IMPRESSION: 1. No acute intracranial abnormality or acute traumatic injury identified. 2. Chronic small vessel disease. Electronically Signed: By: VEAR Hurst M.D. On: 12/31/2023 10:18   CT CERVICAL SPINE WO CONTRAST Result Date: 12/31/2023 CLINICAL DATA:  89 year old female level 1 trauma. Unwitnessed fall. On blood thinners. EXAM: CT CERVICAL SPINE WITHOUT CONTRAST TECHNIQUE: Multidetector CT imaging of the cervical spine was performed without intravenous contrast. Multiplanar CT image reconstructions were also generated. RADIATION DOSE REDUCTION: This exam was performed according to the departmental dose-optimization program which includes automated exposure control, adjustment of the mA and/or kV according to patient size and/or use of iterative reconstruction technique. COMPARISON:  Head CT today.  Cervical spine CT 06/14/2016. FINDINGS: Alignment: Mild motion artifact today but improved cervical lordosis compared to 2017. Chronic degenerative appearing anterolisthesis of C4 on C5 and C7 on T1. Bilateral posterior element alignment appears stable. Skull base and vertebrae: Bone mineralization is within normal limits for age. Visualized skull base is intact. No atlanto-occipital dissociation. C1 and C2 appear intact and aligned. Intermittent motion artifact below C1. No acute osseous abnormality identified. Soft tissues and spinal canal: No prevertebral fluid or swelling. No visible canal hematoma. Bulky calcified cervical carotid atherosclerosis in the bilateral neck. Disc levels: Stable since 2017. Upper chest: Chest CT reported separately today. Visible upper thoracic levels appear intact. IMPRESSION: 1. No acute traumatic injury identified in the cervical spine. 2. Chronic cervical spine degeneration. 3. Bulky calcified cervical carotid atherosclerosis. #1 discussed by telephone with Ozell  Tanieka Pownall PA on 12/31/2023 at 10:20 . Electronically Signed   By: VEAR Hurst M.D.    On: 12/31/2023 10:22   DG Pelvis Portable Result Date: 12/31/2023 CLINICAL DATA:  Fall. EXAM: PORTABLE PELVIS 1-2 VIEWS COMPARISON:  None Available. FINDINGS: There is no evidence of pelvic fracture or diastasis. No pelvic bone lesions are seen. IMPRESSION: No acute abnormality seen. Electronically Signed   By: Lynwood Landy Raddle M.D.   On: 12/31/2023 09:55   DG Chest Port 1 View Result Date: 12/31/2023 CLINICAL DATA:  Fall. EXAM: PORTABLE CHEST 1 VIEW COMPARISON:  June 12, 2023. FINDINGS: The heart size and mediastinal contours are within normal limits. Both lungs are clear. The visualized skeletal structures are unremarkable. IMPRESSION: No active disease. Electronically Signed   By: Lynwood Landy Raddle M.D.   On: 12/31/2023 09:54    Anti-infectives (From admission, onward)    Start     Dose/Rate Route Frequency Ordered Stop   12/31/23 1045  ceFEPIme  (MAXIPIME ) 2 g in sodium chloride  0.9 % 100 mL IVPB        2 g 200 mL/hr over 30 Minutes Intravenous  Once 12/31/23 1039     12/31/23 1045  metroNIDAZOLE  (FLAGYL ) IVPB 500 mg        500 mg 100 mL/hr over 60 Minutes Intravenous  Once 12/31/23 1039     12/31/23 1045  vancomycin  (VANCOCIN ) IVPB 1000 mg/200 mL premix  Status:  Discontinued        1,000 mg 200 mL/hr over 60 Minutes Intravenous  Once 12/31/23 1039 12/31/23 1041   12/31/23 1045  vancomycin  (VANCOREADY) IVPB 1500 mg/300 mL        1,500 mg 150 mL/hr over 120 Minutes Intravenous  Once 12/31/23 1042         Assessment/Plan Fall on [employer] is a 89 y.o. female who presented as a level 1 s/p GLF at nursing facility.  She was given 3U whole blood with intermittent improvement of blood pressure.  Reviewed trauma scans with Radiology and they are negative for any acute injury.  She has no obvious external sources of bleeding aside from a hematoma to the RUE.  She was started on levo with improvement of her BP.  I discussed with the EDP that given no acute traumatic  findings on imaging we would recommend CCM for admission and further workup.  We will follow-up on final reads of CTs and x-rays to ensure no traumatic findings that were not identified on initial reads.  I reviewed nursing notes, last 24 h vitals and pain scores, last 48 h intake and output, last 24 h labs and trends, and last 24 h imaging results.  Ozell CHRISTELLA Shaper, [facility] 12/31/2023, 10:53 AM Please see Amion for pager number during day hours 7:00am-4:30pm

## 2023-12-31 NOTE — Plan of Care (Signed)

## 2023-12-31 NOTE — ED Notes (Signed)
Dr Lovick at bedside ?

## 2023-12-31 NOTE — Progress Notes (Signed)
 Orthopedic Tech Progress Note Patient Details:  Amy Mckay [birth year] [id number] Level 1 Trauma. Ortho not currently needed Patient ID: Amy Mckay, female   DOB: [birth year], 89 y.o.   MRN: [id number]  Amy Mckay Amy Mckay Cos 12/31/2023, 10:03 AM

## 2024-01-01 ENCOUNTER — Inpatient Hospital Stay (HOSPITAL_COMMUNITY)

## 2024-01-01 DIAGNOSIS — J9611 Chronic respiratory failure with hypoxia: Secondary | ICD-10-CM | POA: Diagnosis not present

## 2024-01-01 DIAGNOSIS — R578 Other shock: Secondary | ICD-10-CM | POA: Diagnosis not present

## 2024-01-01 DIAGNOSIS — I959 Hypotension, unspecified: Secondary | ICD-10-CM | POA: Diagnosis not present

## 2024-01-01 LAB — CBC
HCT: 40.4 % (ref 36.0–46.0)
HCT: 41.1 % (ref 36.0–46.0)
Hemoglobin: 13.6 g/dL (ref 12.0–15.0)
Hemoglobin: 13.8 g/dL (ref 12.0–15.0)
MCH: 28.8 pg (ref 26.0–34.0)
MCH: 29.1 pg (ref 26.0–34.0)
MCHC: 33.7 g/dL (ref 30.0–36.0)
MCHC: 33.6 g/dL (ref 30.0–36.0)
MCV: 85.6 fL (ref 80.0–100.0)
MCV: 86.7 fL (ref 80.0–100.0)
Platelets: 336 K/uL (ref 150–400)
Platelets: 345 K/uL (ref 150–400)
RBC: 4.72 MIL/uL (ref 3.87–5.11)
RBC: 4.74 MIL/uL (ref 3.87–5.11)
RDW: 17.2 % — ABNORMAL HIGH (ref 11.5–15.5)
RDW: 17.1 % — ABNORMAL HIGH (ref 11.5–15.5)
WBC: 17.3 K/uL — ABNORMAL HIGH (ref 4.0–10.5)
WBC: 18.4 K/uL — ABNORMAL HIGH (ref 4.0–10.5)
nRBC: 0.2 % (ref 0.0–0.2)
nRBC: 0.2 % (ref 0.0–0.2)

## 2024-01-01 LAB — ECHOCARDIOGRAM COMPLETE
AR max vel: 2.33 cm2
AV Area VTI: 2.82 cm2
AV Area mean vel: 2.16 cm2
AV Mean grad: 2 mmHg
AV Peak grad: 3.5 mmHg
Ao pk vel: 0.93 m/s
Area-P 1/2: 4.06 cm2
Height: 61 in
Weight: 2550.28 [oz_av]

## 2024-01-01 LAB — TYPE AND SCREEN
ABO/RH(D): O NEG
Antibody Screen: NEGATIVE
Unit division: 0
Unit division: 0
Unit division: 0

## 2024-01-01 LAB — BASIC METABOLIC PANEL WITH GFR
Anion gap: 11 (ref 5–15)
BUN: 26 mg/dL — ABNORMAL HIGH (ref 8–23)
CO2: 31 mmol/L (ref 22–32)
Calcium: 9.4 mg/dL (ref 8.9–10.3)
Chloride: 96 mmol/L — ABNORMAL LOW (ref 98–111)
Creatinine, Ser: 1.15 mg/dL — ABNORMAL HIGH (ref 0.44–1.00)
GFR, Estimated: 46 mL/min — ABNORMAL LOW (ref >60)
Glucose, Bld: 131 mg/dL — ABNORMAL HIGH (ref 70–99)
Potassium: 4 mmol/L (ref 3.5–5.1)
Sodium: 138 mmol/L (ref 135–145)

## 2024-01-01 LAB — BPAM RBC
Blood Product Expiration Date: 202507222359
Blood Product Expiration Date: 202507232359
Blood Product Expiration Date: 202507232359
ISSUE DATE / TIME: 202507180937
ISSUE DATE / TIME: 202507180950
ISSUE DATE / TIME: 202507181006
Unit Type and Rh: 5100
Unit Type and Rh: 5100
Unit Type and Rh: 5100

## 2024-01-01 LAB — GLUCOSE, CAPILLARY
Glucose-Capillary: 112 mg/dL — ABNORMAL HIGH (ref 70–99)
Glucose-Capillary: 114 mg/dL — ABNORMAL HIGH (ref 70–99)
Glucose-Capillary: 114 mg/dL — ABNORMAL HIGH (ref 70–99)
Glucose-Capillary: 117 mg/dL — ABNORMAL HIGH (ref 70–99)
Glucose-Capillary: 126 mg/dL — ABNORMAL HIGH (ref 70–99)

## 2024-01-01 LAB — LACTIC ACID, PLASMA: Lactic Acid, Venous: 0.9 mmol/L (ref 0.5–1.9)

## 2024-01-01 MED ORDER — ASPIRIN 81 MG PO TBEC
81.0000 mg | DELAYED_RELEASE_TABLET | Freq: Every day | ORAL | Status: DC
  Administered 2024-01-01 – 2024-01-03 (×2): 81 mg via ORAL
  Filled 2024-01-01 (×3): qty 1

## 2024-01-01 MED ORDER — GUAIFENESIN ER 600 MG PO TB12: 600.0000 mg | ORAL_TABLET | Freq: Two times a day (BID) | ORAL | Status: DC | PRN

## 2024-01-01 MED ORDER — METOPROLOL SUCCINATE ER 25 MG PO TB24
25.0000 mg | ORAL_TABLET | Freq: Every day | ORAL | Status: DC
  Administered 2024-01-01 – 2024-01-04 (×4): 25 mg via ORAL
  Filled 2024-01-01 (×4): qty 1

## 2024-01-01 MED ORDER — ASPIRIN 81 MG PO CHEW
81.0000 mg | CHEWABLE_TABLET | Freq: Every day | ORAL | Status: DC
  Administered 2024-01-01 – 2024-01-04 (×4): 81 mg via ORAL
  Filled 2024-01-01 (×4): qty 1

## 2024-01-01 MED ORDER — PANTOPRAZOLE SODIUM 40 MG PO TBEC
40.0000 mg | DELAYED_RELEASE_TABLET | Freq: Two times a day (BID) | ORAL | Status: DC
  Administered 2024-01-01 – 2024-01-04 (×7): 40 mg via ORAL
  Filled 2024-01-01 (×7): qty 1

## 2024-01-01 MED ORDER — SODIUM CHLORIDE 0.9 % IV SOLN
2.0000 g | INTRAVENOUS | Status: AC
  Administered 2024-01-01 – 2024-01-04 (×4): 2 g via INTRAVENOUS
  Filled 2024-01-01 (×4): qty 20

## 2024-01-01 MED ORDER — MEMANTINE HCL 10 MG PO TABS
10.0000 mg | ORAL_TABLET | Freq: Two times a day (BID) | ORAL | Status: DC
  Administered 2024-01-01 – 2024-01-04 (×7): 10 mg via ORAL
  Filled 2024-01-01 (×8): qty 1

## 2024-01-01 MED ORDER — TICAGRELOR 90 MG PO TABS
90.0000 mg | ORAL_TABLET | Freq: Two times a day (BID) | ORAL | Status: DC
  Administered 2024-01-01 – 2024-01-04 (×7): 90 mg via ORAL
  Filled 2024-01-01 (×9): qty 1

## 2024-01-01 MED ORDER — AZITHROMYCIN 250 MG PO TABS
500.0000 mg | ORAL_TABLET | Freq: Every day | ORAL | Status: AC
  Administered 2024-01-01 – 2024-01-03 (×3): 500 mg via ORAL
  Filled 2024-01-01: qty 2
  Filled 2024-01-01: qty 1
  Filled 2024-01-01: qty 2

## 2024-01-01 MED ORDER — ATORVASTATIN CALCIUM 80 MG PO TABS
80.0000 mg | ORAL_TABLET | Freq: Every day | ORAL | Status: DC
  Administered 2024-01-01 – 2024-01-04 (×4): 80 mg via ORAL
  Filled 2024-01-01 (×4): qty 1

## 2024-01-01 MED ORDER — DULOXETINE HCL 60 MG PO CPEP
60.0000 mg | ORAL_CAPSULE | Freq: Every day | ORAL | Status: DC
  Administered 2024-01-01 – 2024-01-04 (×4): 60 mg via ORAL
  Filled 2024-01-01 (×4): qty 1

## 2024-01-01 NOTE — Progress Notes (Signed)
 Patient experiencing coughing fits while drinking liquids. Patient states that liquids go to her stomach and then come back up. She also states that she feels like it sometimes goes into her lungs. This RN mixed nectar thick thickener into patient's coffee, since then no coughing fits have been observed. This RN placed a speech pathology evaluation.

## 2024-01-01 NOTE — Plan of Care (Signed)
  Problem: Education: Goal: Knowledge of General Education information will improve Description: Including pain rating scale, medication(s)/side effects and non-pharmacologic comfort measures Outcome: Progressing   Problem: Clinical Measurements: Goal: Ability to maintain clinical measurements within normal limits will improve Outcome: Progressing   Problem: Nutrition: Goal: Adequate nutrition will be maintained Outcome: Progressing   Problem: Pain Managment: Goal: General experience of comfort will improve and/or be controlled Outcome: Progressing   Problem: Safety: Goal: Ability to remain free from injury will improve Outcome: Progressing   Problem: Skin Integrity: Goal: Risk for impaired skin integrity will decrease Outcome: Progressing

## 2024-01-01 NOTE — Progress Notes (Addendum)
 NAME:  Brandan Glauber, MRN:  [id number], DOB:  10-Jul-1934, LOS: 1 ADMISSION DATE:  12/31/2023    History of Present Illness:  89yo F PMHx COPD on 3L at baseline, chronic anemia due to rectal bleeding 2/2 prolapse presented after unwitnessed fall out of bed on Brillinta. Active bleeding from RUE but otherwise no traumatic injuries. Received 3U whole blood and remained hypotensive so was started on norepinephrine . Comes from rehab facility, reportedly with progressive weakness, urinary frequency, and dysuria over the last few days.   Pertinent  Medical History  Chronic rectal bleeding due to prolapse COPD on 3L O2 GERD Hypothyroidism Hypertension PAD w/ SMA stent (reason for Brilinta )  pulmonary nodules Restless leg  Chronic hyponatremia Esophageal dysphagia Proctitis, colitis NSTEMI CVA  Significant Hospital Events: Including procedures, antibiotic start and stop dates in addition to other pertinent events   7/18 admitted, 3 units whole blood during trauma activation as well as . Fem CVC placed, norepi, started on antibiotics. Verbally reported to have received 3L crystalloid fluid but do not see that in chart  Interim History / Subjective:  States she fell out of bed prior to coming to the hospital.  Notes some burning with urination over the last few days but this is not new, states that this is related to her rectal prolapse issues.  Also notes she had a swallow study a week ago and diagnosed with esophageal dysphagia, is supposed to be on mechanical soft diet.  States that she sometimes chokes on her food.  Objective    Blood pressure (!) 124/97, pulse 99, temperature 98.7 F (37.1 C), temperature source Oral, resp. rate 15, height 5' 1 (1.549 m), weight 72.3 kg, SpO2 98%.        Intake/Output Summary (Last 24 hours) at 01/01/2024 1037 Last data filed at 01/01/2024 1000 Gross per 24 hour  Intake 1171.41 ml  Output 4550 ml  Net -3378.59 ml   Filed Weights   12/31/23 1034  12/31/23 1123 12/31/23 1254  Weight: 68 kg 72.6 kg 72.3 kg    Examination: General: No acute distress, sitting up eating breakfast HENT: NCAT Lungs: Breathing comfortably on baseline 3 L O2, lungs CTAB Cardiovascular: Regular rate and rhythm, no murmur Abdomen: Soft, nontender Extremities: 1+ pitting edema bilateral lower extremities, SCDs in place Neuro: Awake, alert, oriented, answering all questions appropriately  Resolved problem list  Hypokalemia Assessment and Plan  Shock, concern for septic Suspect septic shock, white blood cell count elevated to 22 initially, improved this morning to 17.  Urinalysis with no obvious sign of infection, blood cultures pending.  CT C/A/P suggestive of possible aspiration bronchiolitis. Likely as pt has esophageal dysphagia and often chokes on food. Echo 05/2023 with LVEF 40 to 45%, LV moderately decreased function and regional wall motion abnormalities.  Grade 3 diastolic dysfunction.  Low suspicion for cardiogenic shock given she does not appear volume up at this time however notes that both of her legs have been swelling more than usual over the last few weeks.  Do not suspect hemorrhagic shock, she does not have any evidence of active bleeding to explain this and hemoglobin stable. -Consider repeat echo -norepi stopped, MAPs maintained >65 -antibiotics-- de-escalated today to ceftriaxone  x4 doses and azithromycin  x3 doses -follow blood cultures   Chronic respiratory failure with hypoxia due to COPD -brovana , yupelri  -duonebs PRN -con't supplemental O2 to maintain SpO2> 90%  RLE edema Noted to be more pronounced than LLE on initial exam -RLE US  to assess for dvt  Anemia, chronic -stable -transfuse for Hb <7 or hemodynamically significant bleeding   Hematoma RUE -compressive dressing   Hyperglycemia A1C 5.5 -moderate SSI -goal BG 140-180   Hypothyroidism -home synthroid  daily  Stable for transfer to floor  Best Practice (right  click and Reselect all SmartList Selections daily)   Diet/type: Regular consistency (see orders) DVT prophylaxis SCD Pressure ulcer(s): N/A GI prophylaxis: N/A Lines: Central line Foley:  Yes, and it is still needed Code Status:  limited Last date of multidisciplinary goals of care discussion []   Labs   CBC: Recent Labs  Lab 12/31/23 1040 12/31/23 1103 12/31/23 1327 12/31/23 1552 01/01/24 0426  WBC 22.4*  --  21.4* 17.8* 17.3*  HGB 11.0* 12.2 13.6 13.8 13.6  HCT 33.8* 36.0 41.2 41.0 40.4  MCV 89.2  --  86.6 86.0 85.6  PLT 295  --  346 333 336    Basic Metabolic Panel: Recent Labs  Lab 12/31/23 1040 12/31/23 1103 01/01/24 0426  NA 137 138 138  K 3.0* 2.9* 4.0  CL 96* 95* 96*  CO2 25  --  31  GLUCOSE 145* 145* 131*  BUN 26* 27* 26*  CREATININE 1.11* 1.10* 1.15*  CALCIUM  8.2*  --  9.4   GFR: Estimated Creatinine Clearance: 31.3 mL/min (A) (by C-G formula based on SCr of 1.15 mg/dL (H)). Recent Labs  Lab 12/31/23 1040 12/31/23 1103 12/31/23 1327 12/31/23 1552 12/31/23 1721 01/01/24 0426  WBC 22.4*  --  21.4* 17.8*  --  17.3*  LATICACIDVEN  --  2.3*  --   --  3.6* 0.9    Liver Function Tests: Recent Labs  Lab 12/31/23 1040  AST 36  ALT 23  ALKPHOS 63  BILITOT 0.7  PROT 6.5  ALBUMIN 2.8*   No results for input(s): LIPASE, AMYLASE in the last 168 hours. No results for input(s): AMMONIA in the last 168 hours.  ABG    Component Value Date/Time   TCO2 29 12/31/2023 1103     Coagulation Profile: Recent Labs  Lab 12/31/23 1040  INR 1.1    Cardiac Enzymes: Recent Labs  Lab 12/31/23 1040  CKTOTAL 139    HbA1C: Hgb A1c MFr Bld  Date/Time Value Ref Range Status  12/31/2023 01:27 PM 5.5 4.8 - 5.6 % Final    Comment:    (NOTE) Diagnosis of Diabetes The following HbA1c ranges recommended by the American Diabetes Association (ADA) may be used as an aid in the diagnosis of diabetes mellitus.  Hemoglobin             Suggested A1C  NGSP%              Diagnosis  <5.7                   Non Diabetic  5.7-6.4                Pre-Diabetic  >6.4                   Diabetic  <7.0                   Glycemic control for                       adults with diabetes.      CBG: Recent Labs  Lab 12/31/23 1248 12/31/23 1658 12/31/23 2255 01/01/24 0757  GLUCAP 131* 236* 105* 112*    Review of Systems:   Rectal  pain, chronic  Past Medical History:  She,  has a past medical history of COPD (chronic obstructive pulmonary disease) (HCC), GERD (gastroesophageal reflux disease), and Hypertension.   Surgical History:   Past Surgical History:  Procedure Laterality Date   COLONOSCOPY     ESOPHAGOGASTRODUODENOSCOPY       Social History:   reports that she has quit smoking. Her smoking use included cigarettes. She has never used smokeless tobacco.   Family History:  Her family history is not on file.   Allergies Allergies  Allergen Reactions   Penicillins Other (See Comments)    Tolerated zosyn  12/31/2023     Home Medications  Prior to Admission medications   Medication Sig Start Date End Date Taking? Authorizing Provider  acetaminophen  (TYLENOL ) 500 MG tablet Take 1,000 mg by mouth 3 (three) times daily.   Yes [provider]  ascorbic acid  (VITAMIN C) 500 MG tablet Take 500 mg by mouth daily.   Yes [provider]  aspirin  EC 81 MG tablet Take 81 mg by mouth daily.   Yes [provider]  atorvastatin  (LIPITOR ) 80 MG tablet Take 80 mg by mouth at bedtime.   Yes [provider]  budesonide-glycopyrrolate-formoterol  (BREZTRI  AEROSPHERE) 160-9-4.8 MCG/ACT AERO inhaler Inhale 2 puffs into the lungs 2 (two) times daily.   Yes [provider]  Calcium  Carb-Cholecalciferol  (CALCIUM  + VITAMIN D3) 600-5 MG-MCG TABS Take 1 tablet by mouth daily at 12 noon.   Yes [provider]  dextromethorphan (DELSYM) 30 MG/5ML liquid Take 60 mg by mouth every 12 (twelve) hours as  needed for cough.   Yes [provider]  DULoxetine  (CYMBALTA ) 60 MG capsule Take 60 mg by mouth daily.   Yes [provider]  ferrous sulfate 220 (44 Fe) MG/5ML solution Take 308 mg by mouth daily.   Yes [provider]  fluticasone  [facility] ALLERGY RELIEF) 50 MCG/ACT nasal spray Place 1 spray into both nostrils daily.   Yes [provider]  gabapentin  (NEURONTIN ) 100 MG capsule Take 100 mg by mouth 3 (three) times daily. Give with 400mg  capsules to equal 500mg  three times daily.   Yes [provider]  gabapentin  (NEURONTIN ) 400 MG capsule Take 400 mg by mouth 3 (three) times daily. Give with 100mg  capsules to equal 500mg  three times daily.   Yes [provider]  guaiFENesin  (ROBITUSSIN) 100 MG/5ML liquid Take 200 mg by mouth 3 (three) times daily.   Yes [provider]  ipratropium-albuterol  (DUONEB) 0.5-2.5 (3) MG/3ML SOLN Take 3 mLs by nebulization 3 (three) times daily.   Yes [provider]  levofloxacin  (LEVAQUIN ) 500 MG tablet Take 500 mg by mouth at bedtime.   Yes [provider]  levothyroxine  (SYNTHROID ) 75 MCG tablet Take 75 mcg by mouth daily before breakfast.   Yes [provider]  memantine  (NAMENDA ) 10 MG tablet Take 10 mg by mouth 2 (two) times daily.   Yes [provider]  Menthol, Topical Analgesic, 4 % GEL Apply 1 application  topically 3 (three) times daily. Biofreeze. Apply to left shoulder.   Yes [provider]  metoprolol  succinate (TOPROL -XL) 25 MG 24 hr tablet Take 25 mg by mouth daily.   Yes [provider]  Miconazole POWD Apply 1 application  topically See admin instructions. Apply a thin dusting to groin area twice a day between meals as needed for rash.   Yes [provider]  NONFORMULARY OR COMPOUNDED ITEM Apply 1 application  topically See admin instructions.  Diltiazem 2%/Lidocaine  2% compounded solution. Apply a thin layer to rectal tissue  twice daily for pain.   Yes [provider]  ondansetron  (ZOFRAN -ODT) 4 MG disintegrating tablet Take 4 mg by mouth 3 (three) times daily as needed for nausea or vomiting.   Yes [provider]  oxyCODONE  (ROXICODONE ) 15 MG immediate release tablet Take 15 mg by mouth See admin instructions. Give 1 tablet (15mg ) by mouth daily as needed for moderate to severe pain between the hours of 0000-0400.   Yes [provider]  Oxycodone  HCl 10 MG TABS Take 10 mg by mouth 4 (four) times daily.   Yes [provider]  pantoprazole  (PROTONIX ) 40 MG tablet Take 40 mg by mouth 2 (two) times daily.   Yes [provider]  phenylephrine -shark liver oil-mineral oil-petrolatum (PREPARATION H) 0.25-14-74.9 % rectal ointment Place 1 Application rectally 4 (four) times daily.   Yes [provider]  polyethylene glycol powder (GLYCOLAX /MIRALAX ) 17 GM/SCOOP powder Take 17 g by mouth daily.   Yes [provider]  pramipexole  (MIRAPEX ) 0.25 MG tablet Take 0.25 mg by mouth 2 (two) times daily.   Yes [provider]  promethazine (PHENERGAN) 12.5 MG tablet Take 12.5 mg by mouth every 8 (eight) hours as needed for nausea.   Yes [provider]  sacubitril -valsartan  (ENTRESTO ) 24-26 MG Take 1 tablet by mouth 2 (two) times daily.   Yes [provider]  sennosides-docusate sodium  (SENOKOT-S) 8.6-50 MG tablet Take 1 tablet by mouth daily.   Yes [provider]  sodium chloride  (OCEAN) 0.65 % SOLN nasal spray Place 1 spray into both nostrils 3 (three) times daily.   Yes [provider]  sucralfate  (CARAFATE ) 1 g tablet Take 1 g by mouth 4 (four) times daily -  with meals and at bedtime.   Yes [provider]  ticagrelor  (BRILINTA ) 90 MG TABS tablet Take 90 mg by mouth 2 (two) times daily.   Yes [provider]  torsemide (DEMADEX) 10 MG tablet Take 10 mg by mouth daily. Give with 20mg  tablet to equal 30mg  daily.    Yes [provider]  torsemide (DEMADEX) 20 MG tablet Take 20 mg by mouth daily. Give with 10mg  tablet to equal 30mg  daily.   Yes [provider]     Critical care time: 30 minutes

## 2024-01-02 ENCOUNTER — Encounter (HOSPITAL_COMMUNITY)

## 2024-01-02 DIAGNOSIS — R6521 Severe sepsis with septic shock: Secondary | ICD-10-CM

## 2024-01-02 DIAGNOSIS — A419 Sepsis, unspecified organism: Secondary | ICD-10-CM

## 2024-01-02 LAB — CBC
HCT: 44.8 % (ref 36.0–46.0)
HCT: 45.2 % (ref 36.0–46.0)
Hemoglobin: 14.4 g/dL (ref 12.0–15.0)
Hemoglobin: 14.7 g/dL (ref 12.0–15.0)
MCH: 27.9 pg (ref 26.0–34.0)
MCH: 28.3 pg (ref 26.0–34.0)
MCHC: 32.1 g/dL (ref 30.0–36.0)
MCHC: 32.5 g/dL (ref 30.0–36.0)
MCV: 86.8 fL (ref 80.0–100.0)
MCV: 87.1 fL (ref 80.0–100.0)
Platelets: 333 K/uL (ref 150–400)
Platelets: 336 K/uL (ref 150–400)
RBC: 5.16 MIL/uL — ABNORMAL HIGH (ref 3.87–5.11)
RBC: 5.19 MIL/uL — ABNORMAL HIGH (ref 3.87–5.11)
RDW: 16.4 % — ABNORMAL HIGH (ref 11.5–15.5)
RDW: 16.2 % — ABNORMAL HIGH (ref 11.5–15.5)
WBC: 16.7 K/uL — ABNORMAL HIGH (ref 4.0–10.5)
WBC: 16.3 K/uL — ABNORMAL HIGH (ref 4.0–10.5)
nRBC: 0.1 % (ref 0.0–0.2)
nRBC: 0 % (ref 0.0–0.2)

## 2024-01-02 LAB — BASIC METABOLIC PANEL WITH GFR
Anion gap: 16 — ABNORMAL HIGH (ref 5–15)
BUN: 28 mg/dL — ABNORMAL HIGH (ref 8–23)
CO2: 26 mmol/L (ref 22–32)
Calcium: 9.4 mg/dL (ref 8.9–10.3)
Chloride: 94 mmol/L — ABNORMAL LOW (ref 98–111)
Creatinine, Ser: 0.99 mg/dL (ref 0.44–1.00)
GFR, Estimated: 55 mL/min — ABNORMAL LOW (ref >60)
Glucose, Bld: 96 mg/dL (ref 70–99)
Potassium: 4 mmol/L (ref 3.5–5.1)
Sodium: 136 mmol/L (ref 135–145)

## 2024-01-02 LAB — GLUCOSE, CAPILLARY
Glucose-Capillary: 107 mg/dL — ABNORMAL HIGH (ref 70–99)
Glucose-Capillary: 112 mg/dL — ABNORMAL HIGH (ref 70–99)
Glucose-Capillary: 108 mg/dL — ABNORMAL HIGH (ref 70–99)
Glucose-Capillary: 103 mg/dL — ABNORMAL HIGH (ref 70–99)

## 2024-01-02 NOTE — Plan of Care (Signed)

## 2024-01-02 NOTE — Evaluation (Signed)
 Clinical/Bedside Swallow Evaluation Patient Details  Name: Amy Mckay MRN: [id number] Date of Birth: 1935-02-08  Today's Date: 01/02/2024 Time: SLP Start Time (ACUTE ONLY): 0900 SLP Stop Time (ACUTE ONLY): 0950 SLP Time Calculation (min) (ACUTE ONLY): 50 min  Past Medical History:  Past Medical History:  Diagnosis Date   COPD (chronic obstructive pulmonary disease) (HCC)    GERD (gastroesophageal reflux disease)    Hypertension    Past Surgical History:  Past Surgical History:  Procedure Laterality Date   COLONOSCOPY     ESOPHAGOGASTRODUODENOSCOPY     HPI:  89yo F PMHx COPD on 3L at baseline, chronic anemia due to rectal bleeding 2/2 prolapse presented after unwitnessed fall out of bed on Brillinta. Active bleeding from RUE but otherwise no traumatic injuries. Received 3U whole blood and remained hypotensive so was started on norepinephrine . Comes from rehab facility, reportedly with progressive weakness, urinary frequency, and dysuria over the last few days. Pt with concern for septic shock with pulmonary source. Suspected to aspiration etiology per workup and pt dysphagia report.    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 Pt was cooperative and alert and seemed to provide good history to her dysphagia. 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 Despite chart review no notes obtained. She believes she is having liquids/ and or foods go in to lungs at times and says when drinking liquids they come back up despite attempts to reswallow continually. 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 SLP recommends MBSS, however radiology unable to accomodate this date due to scheduling conflicts. Will attempt MBSS next date.  Clinical swallow evaluation this date notable for delayed cough with thin liquids and pt report  of regurgitation and needing to swallow liquids. Nectar thick liquids did not elicit overt coughing. She notes she takes her medicines crushed at baseline to avoid them catching in likely diverticulum. Recommend dysphagia 3 (mechanical soft) and nectar thick liquids with meds crushed in puree. Will follow up next date.  SLP Visit Diagnosis: Dysphagia, unspecified (R13.10)    Aspiration Risk  Moderate aspiration risk    Diet Recommendation   Nectar;Dysphagia 3 (mechanical soft)  Medication Administration: Crushed with puree    Other  Recommendations Oral Care Recommendations: Oral care BID     Assistance Recommended at Discharge    Functional Status Assessment Patient has had a recent decline in their functional status and demonstrates the ability to make significant improvements in function in a reasonable and predictable amount of time.  Frequency and Duration min 2x/week  2 weeks       Prognosis Prognosis for improved oropharyngeal function: Good Barriers to Reach Goals: Motivation      Swallow Study   General Date of Onset: 01/01/24 HPI: 89yo F PMHx COPD on 3L at baseline, chronic anemia due to rectal bleeding 2/2 prolapse presented after unwitnessed fall out of bed on Brillinta. Active bleeding from RUE but otherwise no traumatic injuries. Received 3U whole blood and remained hypotensive so was started on norepinephrine . Comes from rehab facility, reportedly with progressive weakness, urinary frequency, and dysuria over the last few days. Pt with concern for septic shock with pulmonary source. Suspected to aspiration etiology per workup and pt dysphagia report. Type of Study: Bedside Swallow Evaluation Previous Swallow Assessment: pt reports hx of dysphagia workup in the past few weeks; sounded like FEES; no documentation  found Diet Prior to this Study: Thin liquids (Level 0);Dysphagia 3 (mechanical soft) Temperature Spikes Noted: No Respiratory Status: Nasal cannula History of  Recent Intubation: No Behavior/Cognition: Alert;Cooperative;Pleasant mood Oral Cavity Assessment: Dry Oral Care Completed by SLP: Yes Oral Cavity - Dentition: Dentures, top;Missing dentition Vision: Functional for self-feeding Self-Feeding Abilities: Needs set up Patient Positioning: Upright in bed Baseline Vocal Quality: Normal Volitional Cough: Congested Volitional Swallow: Able to elicit    Oral/Motor/Sensory Function Overall Oral Motor/Sensory Function: Within functional limits   Ice Chips Ice chips: Impaired Presentation: Spoon Pharyngeal Phase Impairments: Suspected delayed Swallow;Multiple swallows;Throat Clearing - Delayed   Thin Liquid Thin Liquid: Impaired Presentation: Cup Pharyngeal  Phase Impairments: Suspected delayed Swallow;Multiple swallows;Cough - Delayed    Nectar Thick Nectar Thick Liquid: Within functional limits Presentation: Cup   Honey Thick Honey Thick Liquid: Not tested   Puree Puree: Impaired Presentation: Spoon Pharyngeal Phase Impairments: Suspected delayed Swallow;Multiple swallows   Solid     Solid: Impaired Presentation: Self Fed Oral Phase Impairments: Impaired mastication Pharyngeal Phase Impairments: Suspected delayed Swallow;Multiple swallows      Ayodeji Keimig H. MA, CCC-SLP Acute Rehabilitation Services   01/02/2024,10:00 AM

## 2024-01-02 NOTE — Progress Notes (Signed)
 PROGRESS NOTE    Amy Mckay  FMW:[id number] DOB: [birth year] DOA: 12/31/2023 PCP: Karlene Mungo, MD    Brief Narrative:  89 year old gentleman with history of COPD on 3 L oxygen at baseline, chronic anemia due to rectal bleeding secondary to prolapse presented after an unwitnessed fall out of bed.  Patient was noted to have active bleeding from right upper extremity, trauma series negative.  Received 3 units of whole blood and remained hypotensive, transiently on norepinephrine .  Patient from a skilled nursing facility.  Reported progressive weakness and urinary frequency last few days.  Admitted to ICU stabilized and transferred to the floor now.  Patient does have a history significant for esophageal dysphagia and frequent aspiration.  Subjective: Patient seen and examined.  Her main concern is prolapsed rectum and pain and intermittent bleeding.  She has some cough but denies any chest pain or shortness of breath.  Patient rarely wishes she could have surgery for her prolapsed rectum but she knows that she cannot tolerate the big surgery.   Assessment & Plan:   Shock, concern for sepsis. Presented with WBC count 22 and hypotension. Urinalysis was without obvious evidence of infection. Blood cultures, negative CT scan chest abdomen pelvis, bronchiolitis, possible aspiration.  Significant history of esophageal dysphagia. Treating as aspiration pneumonia. Currently patient is off vasopressors.  Blood pressures are maintained. Remains on ceftriaxone  and azithromycin , will complete 5 days therapy. Speech therapy to reevaluate.  Chronic respiratory failure with hypoxemia On 3 L oxygen at home.  Now is stable.  On Brovana , Yupelri , DuoNebs,  Anemia of acute blood loss, hematoma right upper extremity: Compressive dressing.  Received 3 PRBCs.  Hemoglobin is stable.  Blood loss mostly from rectal bleeding from prolapsed rectum.  Right lower extremity edema: Asymmetrical swelling of the  extremities. Duplexes, pending 2D echocardiogram with echocardiogram findings 60 to 65% ejection fraction, comparable to previous.  Grade 1 diastolic dysfunction. Ecchymosis and swelling right arm.  Hypothyroidism: On Synthroid .  Continue.  Peripheral vascular disease status post stents: Back on aspirin  and Brilinta .    DVT prophylaxis: Place and maintain sequential compression device Start: 12/31/23 1305   Code Status: DNR with limited intervention Family Communication: None at the bedside Disposition Plan: Status is: Inpatient Remains inpatient appropriate because: On antibiotics     Consultants:  Trauma Critical care  Procedures:  None  Antimicrobials:  Rocephin  azithromycin  7/18---     Objective: Vitals:   01/02/24 0500 01/02/24 0506 01/02/24 0614 01/02/24 0752  BP:  (!) 151/107 (!) 143/109 (!) 145/109  Pulse:  (!) 105  (!) 107  Resp:    17  Temp:  98.1 F (36.7 C)  98.8 F (37.1 C)  TempSrc:  Oral  Oral  SpO2:  97%  96%  Weight: 72.6 kg     Height:        Intake/Output Summary (Last 24 hours) at 01/02/2024 0834 Last data filed at 01/02/2024 0500 Gross per 24 hour  Intake 719.71 ml  Output 1200 ml  Net -480.29 ml   Filed Weights   12/31/23 1123 12/31/23 1254 01/02/24 0500  Weight: 72.6 kg 72.3 kg 72.6 kg    Examination:  General: Chronically sick looking.  Age appropriate.  Not in any distress.  Frail and debilitated. Cardiovascular: S1-S2 normal.  Regular rate rhythm.   Respiratory: Bilateral conducted upper airway sounds.  On 3 L oxygen. Gastrointestinal: Soft and nontender.  Bowel sounds present. Ext: 1+ edema both legs. Neuro: Mostly alert awake and oriented.  Data Reviewed: I have personally reviewed following labs and imaging studies  CBC: Recent Labs  Lab 12/31/23 1327 12/31/23 1552 01/01/24 0426 01/01/24 1633 01/02/24 0752  WBC 21.4* 17.8* 17.3* 18.4* 16.7*  HGB 13.6 13.8 13.6 13.8 14.4  HCT 41.2 41.0 40.4 41.1 44.8   MCV 86.6 86.0 85.6 86.7 86.8  PLT 346 333 336 345 333   Basic Metabolic Panel: Recent Labs  Lab 12/31/23 1040 12/31/23 1103 01/01/24 0426  NA 137 138 138  K 3.0* 2.9* 4.0  CL 96* 95* 96*  CO2 25  --  31  GLUCOSE 145* 145* 131*  BUN 26* 27* 26*  CREATININE 1.11* 1.10* 1.15*  CALCIUM  8.2*  --  9.4   GFR: Estimated Creatinine Clearance: 31.4 mL/min (A) (by C-G formula based on SCr of 1.15 mg/dL (H)). Liver Function Tests: Recent Labs  Lab 12/31/23 1040  AST 36  ALT 23  ALKPHOS 63  BILITOT 0.7  PROT 6.5  ALBUMIN 2.8*   No results for input(s): LIPASE, AMYLASE in the last 168 hours. No results for input(s): AMMONIA in the last 168 hours. Coagulation Profile: Recent Labs  Lab 12/31/23 1040  INR 1.1   Cardiac Enzymes: Recent Labs  Lab 12/31/23 1040  CKTOTAL 139   BNP (last 3 results) No results for input(s): PROBNP in the last 8760 hours. HbA1C: Recent Labs    12/31/23 1327  HGBA1C 5.5   CBG: Recent Labs  Lab 01/01/24 1120 01/01/24 1520 01/01/24 2056 01/01/24 2332 01/02/24 0751  GLUCAP 114* 126* 114* 117* 107*   Lipid Profile: No results for input(s): CHOL, HDL, LDLCALC, TRIG, CHOLHDL, LDLDIRECT in the last 72 hours. Thyroid Function Tests: Recent Labs    12/31/23 1040  TSH 1.473   Anemia Panel: No results for input(s): VITAMINB12, FOLATE, FERRITIN, TIBC, IRON, RETICCTPCT in the last 72 hours. Sepsis Labs: Recent Labs  Lab 12/31/23 1103 12/31/23 1721 01/01/24 0426  LATICACIDVEN 2.3* 3.6* 0.9    Recent Results (from the past 240 hours)  MRSA Next Gen by PCR, Nasal     Status: None   Collection Time: 12/31/23 12:52 PM   Specimen: Nasal Mucosa; Nasal Swab  Result Value Ref Range Status   MRSA by PCR Next Gen NOT DETECTED NOT DETECTED Final    Comment: (NOTE) The GeneXpert MRSA Assay (FDA approved for NASAL specimens only), is one component of a comprehensive MRSA colonization surveillance program. It is  not intended to diagnose MRSA infection nor to guide or monitor treatment for MRSA infections. Test performance is not FDA approved in patients less than 33 years old. Performed at [facility], [address] [place], [place] [address]   Culture, blood (Routine X 2) w Reflex to ID Panel     Status: None (Preliminary result)   Collection Time: 12/31/23  1:27 PM   Specimen: BLOOD LEFT ARM  Result Value Ref Range Status   Specimen Description BLOOD LEFT ARM  Final   Special Requests   Final    BOTTLES DRAWN AEROBIC ONLY Blood Culture results may not be optimal due to an inadequate volume of blood received in culture bottles   Culture   Final    NO GROWTH 2 DAYS Performed at [facility], [address], [place], [place] [address]    Report Status PENDING  Incomplete  Culture, blood (Routine X 2) w Reflex to ID Panel     Status: None (Preliminary result)   Collection Time: 12/31/23  2:51 PM  Specimen: BLOOD RIGHT HAND  Result Value Ref Range Status   Specimen Description BLOOD RIGHT HAND  Final   Special Requests   Final    BOTTLES DRAWN AEROBIC AND ANAEROBIC Blood Culture results may not be optimal due to an inadequate volume of blood received in culture bottles   Culture   Final    NO GROWTH 2 DAYS Performed at [facility], [address] [place], [place] [address]    Report Status PENDING  Incomplete         Radiology Studies: ECHOCARDIOGRAM COMPLETE Result Date: 01/01/2024    ECHOCARDIOGRAM REPORT   Patient Name:   CLARKE AMBURN Date of Exam: 01/01/2024 Medical Rec #:  [id number]     Height:       61.0 in Accession #:    [id number]    Weight:       159.4 lb Date of Birth:  [birth year]     BSA:          1.715 m Patient Age:    89 years      BP:           135/98 mmHg Patient Gender: F             HR:           102 bpm. Exam Location:  Inpatient Procedure: 2D Echo, Color Doppler and Cardiac Doppler (Both Spectral and Color            Flow Doppler were utilized  during procedure). Indications:    Shock  History:        Patient has no prior history of Echocardiogram examinations.                 COPD; Risk Factors:Hypertension.  Sonographer:    Jayson Gaskins Referring Phys: [phone] MATTHEW R HUNSUCKER IMPRESSIONS  1. Left ventricular ejection fraction, by estimation, is 60 to 65%. The left ventricle has normal function. The left ventricle has no regional wall motion abnormalities. Left ventricular diastolic parameters are consistent with Grade I diastolic dysfunction (impaired relaxation).  2. Right ventricular systolic function is normal. The right ventricular size is normal.  3. The mitral valve is normal in structure. No evidence of mitral valve regurgitation. No evidence of mitral stenosis. Severe mitral annular calcification.  4. The aortic valve is normal in structure. Aortic valve regurgitation is not visualized. No aortic stenosis is present.  5. The inferior vena cava is normal in size with greater than 50% respiratory variability, suggesting right atrial pressure of 3 mmHg. FINDINGS  Left Ventricle: Left ventricular ejection fraction, by estimation, is 60 to 65%. The left ventricle has normal function. The left ventricle has no regional wall motion abnormalities. The left ventricular internal cavity size was normal in size. There is  no left ventricular hypertrophy. Left ventricular diastolic parameters are consistent with Grade I diastolic dysfunction (impaired relaxation). Right Ventricle: The right ventricular size is normal. No increase in right ventricular wall thickness. Right ventricular systolic function is normal. Left Atrium: Left atrial size was normal in size. Right Atrium: Right atrial size was normal in size. Pericardium: There is no evidence of pericardial effusion. Mitral Valve: The mitral valve is normal in structure. Severe mitral annular calcification. No evidence of mitral valve regurgitation. No evidence of mitral valve stenosis. Tricuspid Valve:  The tricuspid valve is normal in structure. Tricuspid valve regurgitation is not demonstrated. No evidence of tricuspid stenosis. Aortic Valve: The aortic valve is normal in structure. Aortic  valve regurgitation is not visualized. No aortic stenosis is present. Aortic valve mean gradient measures 2.0 mmHg. Aortic valve peak gradient measures 3.5 mmHg. Aortic valve area, by VTI measures 2.82 cm. Pulmonic Valve: The pulmonic valve was normal in structure. Pulmonic valve regurgitation is not visualized. No evidence of pulmonic stenosis. Aorta: The aortic root is normal in size and structure. Venous: The inferior vena cava is normal in size with greater than 50% respiratory variability, suggesting right atrial pressure of 3 mmHg. IAS/Shunts: No atrial level shunt detected by color flow Doppler.  LEFT VENTRICLE PLAX 2D LVOT diam:     1.80 cm   Diastology LV SV:         48        LV e' medial:    6.20 cm/s LV SV Index:   28        LV E/e' medial:  10.4 LVOT Area:     2.54 cm  LV e' lateral:   6.96 cm/s                          LV E/e' lateral: 9.3  RIGHT VENTRICLE RV S prime:     17.50 cm/s TAPSE (M-mode): 2.0 cm LEFT ATRIUM             Index        RIGHT ATRIUM           Index LA Vol (A2C):   28.0 ml 16.33 ml/m  RA Area:     11.70 cm LA Vol (A4C):   32.0 ml 18.66 ml/m  RA Volume:   24.40 ml  14.23 ml/m LA Biplane Vol: 31.5 ml 18.37 ml/m  AORTIC VALVE AV Area (Vmax):    2.33 cm AV Area (Vmean):   2.16 cm AV Area (VTI):     2.82 cm AV Vmax:           93.20 cm/s AV Vmean:          72.000 cm/s AV VTI:            0.169 m AV Peak Grad:      3.5 mmHg AV Mean Grad:      2.0 mmHg LVOT Vmax:         85.40 cm/s LVOT Vmean:        61.200 cm/s LVOT VTI:          0.187 m LVOT/AV VTI ratio: 1.11  AORTA Ao Root diam: 3.10 cm MITRAL VALVE MV Area (PHT): 4.06 cm     SHUNTS MV Decel Time: 187 msec     Systemic VTI:  0.19 m MV E velocity: 64.50 cm/s   Systemic Diam: 1.80 cm MV A velocity: 144.00 cm/s MV E/A ratio:  0.45 Oneil Parchment MD Electronically signed by Oneil Parchment MD Signature Date/Time: 01/01/2024/5:03:24 PM    Final    DG Humerus Right Result Date: 12/31/2023 CLINICAL DATA:  Right arm pain after fall. EXAM: RIGHT HUMERUS - 2+ VIEW COMPARISON:  None Available. FINDINGS: There is no evidence of fracture or other focal bone lesions. Soft tissues are unremarkable. IMPRESSION: Negative. Electronically Signed   By: Lynwood Landy Raddle M.D.   On: 12/31/2023 10:47   DG Hand Complete Right Result Date: 12/31/2023 CLINICAL DATA:  Right hand pain after fall. EXAM: RIGHT HAND - COMPLETE 3+ VIEW COMPARISON:  None Available. FINDINGS: There is no evidence of fracture or dislocation. There is no evidence of arthropathy or other  focal bone abnormality. Soft tissues are unremarkable. IMPRESSION: Negative. Electronically Signed   By: Lynwood Landy Raddle M.D.   On: 12/31/2023 10:46   CT CHEST ABDOMEN PELVIS W CONTRAST Result Date: 12/31/2023 CLINICAL DATA:  Level 1 trauma. Polytrauma, blunt EXAM: CT CHEST, ABDOMEN, AND PELVIS WITH CONTRAST TECHNIQUE: Multidetector CT imaging of the chest, abdomen and pelvis was performed following the standard protocol during bolus administration of intravenous contrast. RADIATION DOSE REDUCTION: This exam was performed according to the departmental dose-optimization program which includes automated exposure control, adjustment of the mA and/or kV according to patient size and/or use of iterative reconstruction technique. CONTRAST:  75mL OMNIPAQUE  IOHEXOL  350 MG/ML SOLN COMPARISON:  09/07/2023 chest CT and 06/15/2023 CT angiogram of the abdomen and pelvis. FINDINGS: CT CHEST FINDINGS Cardiovascular: Normal heart size. No significant pericardial fluid/thickening. Three vessel coronary atherosclerosis. Atherosclerotic nonaneurysmal thoracic aorta. Normal caliber pulmonary arteries. No evidence of acute thoracic aortic injury. No central pulmonary emboli. Mediastinum/Nodes: No pneumomediastinum. No mediastinal  hematoma. No discrete thyroid nodules. Unremarkable esophagus. No axillary, mediastinal or hilar lymphadenopathy. Lungs/Pleura: No pneumothorax. No pleural effusion. Motion degraded images limiting assessment. Mild-to-moderate patchy tree-in-bud opacity and indistinct nodular foci of consolidation throughout the posterior lungs bilaterally, new from 09/07/2023 chest CT, largest 1.6 x 1.5 cm in the posterior right upper lobe on series 5/image 57 and 1.0 cm in the medial left upper lobe on series 5/image 55. Anterior right lower lobe solid 0.4 cm nodule on series 5/image 102 is stable. Musculoskeletal: No aggressive appearing focal osseous lesions. No fracture detected in the chest. Mild thoracic spondylosis. CT ABDOMEN PELVIS FINDINGS Hepatobiliary: Normal liver with no liver laceration or mass. Normal gallbladder with no radiopaque cholelithiasis. No biliary ductal dilatation. Pancreas: Chronic diffuse pancreatic duct dilation up to 5 mm diameter, unchanged. Chronic hypodense 1.0 cm uncinate process pancreatic lesion on series 4/image 81 is unchanged from 06/15/2023 CT angiogram abdomen and pelvis study. No new pancreatic lesions. Spleen: Normal size. No laceration or mass. Adrenals/Urinary Tract: Normal adrenals. No hydronephrosis. No renal laceration. No renal mass. Normal bladder. Stomach/Bowel: Small hiatal hernia. Otherwise normal nondistended stomach. Normal caliber small bowel with no small bowel wall thickening. Appendix not discretely visualized. Moderate left colonic diverticulosis with no large bowel wall thickening or acute pericolonic fat stranding. Vascular/Lymphatic: Atherosclerotic nonaneurysmal abdominal aorta. Patent portal, splenic, hepatic and renal veins. Right common femoral central venous catheter terminates in the right common iliac vein. No pathologically enlarged lymph nodes in the abdomen or pelvis. Reproductive: Status post hysterectomy, with no abnormal findings at the vaginal cuff. No  adnexal mass. Other: No pneumoperitoneum, ascites or focal fluid collection. Musculoskeletal: No aggressive appearing focal osseous lesions. No fracture in the abdomen or pelvis. Moderate lumbar degenerative disc disease. IMPRESSION: 1. No evidence of acute traumatic injury in the chest, abdomen or pelvis. 2. Mild-to-moderate patchy tree-in-bud opacity and indistinct nodular foci of consolidation throughout the posterior lungs bilaterally, new from 09/07/2023 chest CT, largest 1.6 cm in the posterior right upper lobe. Findings are most compatible with an infectious or inflammatory bronchiolitis, favor aspiration. Suggest follow-up noncontrast chest CT in 3 months to ensure resolution. 3. Three vessel coronary atherosclerosis. 4. Small hiatal hernia. 5. Moderate left colonic diverticulosis. 6. Chronic diffuse pancreatic duct dilation up to 5 mm diameter, unchanged. Chronic hypodense 1.0 cm uncinate process pancreatic lesion is unchanged from 06/15/2023 CT angiogram abdomen and pelvis study. No new pancreatic lesions. Suggest follow-up noncontrast abdominal MRI in 1 year. 7.  Aortic Atherosclerosis (ICD10-I70.0). These results were  discussed in person at the time of interpretation on 12/31/2023 at 10:40 am to provider Ozell Shaper PA, who verbally acknowledged these results. Electronically Signed   By: Selinda DELENA Blue M.D.   On: 12/31/2023 10:40   CT HEAD WO CONTRAST Addendum Date: 12/31/2023 ADDENDUM REPORT: 12/31/2023 10:23 ADDENDUM: #1 discussed by telephone with Ozell Shaper PA on 12/31/2023 at 10:20 . Electronically Signed   By: VEAR Hurst M.D.   On: 12/31/2023 10:23   Result Date: 12/31/2023 CLINICAL DATA:  89 year old female level 1 trauma. Unwitnessed fall. On blood thinners. EXAM: CT HEAD WITHOUT CONTRAST TECHNIQUE: Contiguous axial images were obtained from the base of the skull through the vertex without intravenous contrast. RADIATION DOSE REDUCTION: This exam was performed according to the departmental  dose-optimization program which includes automated exposure control, adjustment of the mA and/or kV according to patient size and/or use of iterative reconstruction technique. COMPARISON:  Head CT 11/03/2022. FINDINGS: Brain: Cerebral volume remains normal for age. Chronic extra-axial CSF over the frontal convexities. No midline shift, ventriculomegaly, mass effect, evidence of mass lesion, intracranial hemorrhage or evidence of cortically based acute infarction. Mild for age patchy cerebral white matter hypodensity. Chronic lacunar infarct medial right thalamus. Otherwise preserved gray-white differentiation. Vascular: Calcified atherosclerosis at the skull base. No suspicious intracranial vascular hyperdensity. Skull: No acute osseous abnormality identified. Sinuses/Orbits: No layering sinus fluid or hemorrhage identified. Tympanic cavities and mastoids well aerated. Mild new mucosal thickening left sphenoid sinus. Other: No discrete orbit or scalp soft tissue injury identified. IMPRESSION: 1. No acute intracranial abnormality or acute traumatic injury identified. 2. Chronic small vessel disease. Electronically Signed: By: VEAR Hurst M.D. On: 12/31/2023 10:18   CT CERVICAL SPINE WO CONTRAST Result Date: 12/31/2023 CLINICAL DATA:  89 year old female level 1 trauma. Unwitnessed fall. On blood thinners. EXAM: CT CERVICAL SPINE WITHOUT CONTRAST TECHNIQUE: Multidetector CT imaging of the cervical spine was performed without intravenous contrast. Multiplanar CT image reconstructions were also generated. RADIATION DOSE REDUCTION: This exam was performed according to the departmental dose-optimization program which includes automated exposure control, adjustment of the mA and/or kV according to patient size and/or use of iterative reconstruction technique. COMPARISON:  Head CT today.  Cervical spine CT 06/14/2016. FINDINGS: Alignment: Mild motion artifact today but improved cervical lordosis compared to 2017. Chronic  degenerative appearing anterolisthesis of C4 on C5 and C7 on T1. Bilateral posterior element alignment appears stable. Skull base and vertebrae: Bone mineralization is within normal limits for age. Visualized skull base is intact. No atlanto-occipital dissociation. C1 and C2 appear intact and aligned. Intermittent motion artifact below C1. No acute osseous abnormality identified. Soft tissues and spinal canal: No prevertebral fluid or swelling. No visible canal hematoma. Bulky calcified cervical carotid atherosclerosis in the bilateral neck. Disc levels: Stable since 2017. Upper chest: Chest CT reported separately today. Visible upper thoracic levels appear intact. IMPRESSION: 1. No acute traumatic injury identified in the cervical spine. 2. Chronic cervical spine degeneration. 3. Bulky calcified cervical carotid atherosclerosis. #1 discussed by telephone with Michael Maczis PA on 12/31/2023 at 10:20 . Electronically Signed   By: VEAR Hurst M.D.   On: 12/31/2023 10:22   DG Pelvis Portable Result Date: 12/31/2023 CLINICAL DATA:  Fall. EXAM: PORTABLE PELVIS 1-2 VIEWS COMPARISON:  None Available. FINDINGS: There is no evidence of pelvic fracture or diastasis. No pelvic bone lesions are seen. IMPRESSION: No acute abnormality seen. Electronically Signed   By: Lynwood Landy Raddle M.D.   On: 12/31/2023 09:55   DG Chest [facility]  1 View Result Date: 12/31/2023 CLINICAL DATA:  Fall. EXAM: PORTABLE CHEST 1 VIEW COMPARISON:  June 12, 2023. FINDINGS: The heart size and mediastinal contours are within normal limits. Both lungs are clear. The visualized skeletal structures are unremarkable. IMPRESSION: No active disease. Electronically Signed   By: Lynwood Landy Raddle M.D.   On: 12/31/2023 09:54        Scheduled Meds:  arformoterol   15 mcg Nebulization BID   aspirin   81 mg Oral Daily   aspirin  EC  81 mg Oral Daily   atorvastatin   80 mg Oral Daily   azithromycin   500 mg Oral Daily   Chlorhexidine  Gluconate Cloth  6 each  Topical Daily   DULoxetine   60 mg Oral Daily   gabapentin   500 mg Oral TID   insulin  aspart  0-15 Units Subcutaneous TID WC   insulin  aspart  0-5 Units Subcutaneous QHS   levothyroxine   75 mcg Oral Q0600   memantine   10 mg Oral BID   metoprolol  succinate  25 mg Oral Daily   pantoprazole   40 mg Oral BID   pramipexole   0.25 mg Oral BID   revefenacin   175 mcg Nebulization Daily   ticagrelor   90 mg Oral BID   Continuous Infusions:  cefTRIAXone  (ROCEPHIN )  IV Stopped (01/01/24 1121)     LOS: 2 days    Time spent: 52 minutes    Renato Applebaum, MD [facility]

## 2024-01-02 NOTE — Plan of Care (Signed)
  Problem: Education: Goal: Ability to describe self-care measures that may prevent or decrease complications (Diabetes Survival Skills Education) will improve Outcome: Progressing   Problem: Health Behavior/Discharge Planning: Goal: Ability to manage health-related needs will improve Outcome: Progressing   Problem: Skin Integrity: Goal: Risk for impaired skin integrity will decrease Outcome: Progressing   Problem: Health Behavior/Discharge Planning: Goal: Ability to manage health-related needs will improve Outcome: Progressing   Problem: Clinical Measurements: Goal: Will remain free from infection Outcome: Progressing Goal: Diagnostic test results will improve Outcome: Progressing

## 2024-01-03 ENCOUNTER — Inpatient Hospital Stay (HOSPITAL_COMMUNITY)

## 2024-01-03 DIAGNOSIS — R6521 Severe sepsis with septic shock: Secondary | ICD-10-CM | POA: Diagnosis not present

## 2024-01-03 DIAGNOSIS — R609 Edema, unspecified: Secondary | ICD-10-CM

## 2024-01-03 DIAGNOSIS — A419 Sepsis, unspecified organism: Secondary | ICD-10-CM | POA: Diagnosis not present

## 2024-01-03 LAB — BASIC METABOLIC PANEL WITH GFR
Anion gap: 14 (ref 5–15)
BUN: 18 mg/dL (ref 8–23)
CO2: 28 mmol/L (ref 22–32)
Calcium: 9.5 mg/dL (ref 8.9–10.3)
Chloride: 95 mmol/L — ABNORMAL LOW (ref 98–111)
Creatinine, Ser: 0.91 mg/dL (ref 0.44–1.00)
GFR, Estimated: >60 mL/min (ref >60)
Glucose, Bld: 103 mg/dL — ABNORMAL HIGH (ref 70–99)
Potassium: 3.7 mmol/L (ref 3.5–5.1)
Sodium: 137 mmol/L (ref 135–145)

## 2024-01-03 LAB — GLUCOSE, CAPILLARY
Glucose-Capillary: 104 mg/dL — ABNORMAL HIGH (ref 70–99)
Glucose-Capillary: 116 mg/dL — ABNORMAL HIGH (ref 70–99)
Glucose-Capillary: 112 mg/dL — ABNORMAL HIGH (ref 70–99)
Glucose-Capillary: 159 mg/dL — ABNORMAL HIGH (ref 70–99)

## 2024-01-03 NOTE — Plan of Care (Signed)
  Problem: Education: Goal: Knowledge of General Education information will improve Description: Including pain rating scale, medication(s)/side effects and non-pharmacologic comfort measures Outcome: Progressing   Problem: Health Behavior/Discharge Planning: Goal: Ability to manage health-related needs will improve Outcome: Progressing   Problem: Pain Managment: Goal: General experience of comfort will improve and/or be controlled Outcome: Progressing   Problem: Safety: Goal: Ability to remain free from injury will improve Outcome: Progressing

## 2024-01-03 NOTE — Progress Notes (Signed)
 RLE venous is completed. Lonni Dirden, RVT

## 2024-01-03 NOTE — [place] FL2 (Signed)
 [place]  MEDICAID FL2 LEVEL OF CARE FORM     IDENTIFICATION  Patient Name: Amy Mckay Birthdate: 1934-10-04 Sex: female Admission Date (Current Location): 12/31/2023  [facility] and [place] Number:  Producer, television/film/video and Address:  [facility] [place], [address], [place], [place] [address]      Provider Number: [id number]  Attending Physician Name and Address:  Raenelle Coria, MD  Relative Name and Phone Number:       Current Level of Care: Hospital Recommended Level of Care: Skilled Nursing Facility Prior Approval Number:    Date Approved/Denied:   PASRR Number:    Discharge Plan: SNF    Current Diagnoses: Patient Active Problem List   Diagnosis Date Noted   Septic shock (HCC) 12/31/2023    Orientation RESPIRATION BLADDER Height & Weight     Self, Time, Situation, Place  O2 Incontinent Weight: 177 lb 7.5 oz (80.5 kg) Height:  5' 1 (154.9 cm)  BEHAVIORAL SYMPTOMS/MOOD NEUROLOGICAL BOWEL NUTRITION STATUS      Incontinent Diet (See DC Summary)  AMBULATORY STATUS COMMUNICATION OF NEEDS Skin   Limited Assist Verbally Other (Comment) (Wound on left thigh)                       Personal Care Assistance Level of Assistance  Feeding, Bathing, Dressing Bathing Assistance: Limited assistance Feeding assistance: Limited assistance Dressing Assistance: Limited assistance     Functional Limitations Info  Speech, Hearing, Sight Sight Info: Impaired Hearing Info: Impaired Speech Info: Impaired    SPECIAL CARE FACTORS FREQUENCY  Speech therapy             Speech Therapy Frequency: 2x/week      Contractures      Additional Factors Info  Code Status, Allergies Code Status Info: DNR-Limited Allergies Info: Penicillins           Current Medications (01/03/2024):  This is the current hospital active medication list Current Facility-Administered Medications  Medication Dose Route Frequency Provider Last Rate Last Admin    acetaminophen  (TYLENOL ) tablet 650 mg  650 mg Oral Q4H PRN Gretta Leita SQUIBB, DO   650 mg at 01/01/24 0529   arformoterol  (BROVANA ) nebulizer solution 15 mcg  15 mcg Nebulization BID Gretta Leita SQUIBB, DO   15 mcg at 01/03/24 9153   aspirin  chewable tablet 81 mg  81 mg Oral Daily Everhart, Kirstie, DO   81 mg at 01/03/24 9093   aspirin  EC tablet 81 mg  81 mg Oral Daily Everhart, Kirstie, DO   81 mg at 01/03/24 9092   atorvastatin  (LIPITOR ) tablet 80 mg  80 mg Oral Daily Everhart, Kirstie, DO   80 mg at 01/03/24 0907   cefTRIAXone  (ROCEPHIN ) 2 g in sodium chloride  0.9 % 100 mL IVPB  2 g Intravenous Q24H Everhart, Kirstie, DO 200 mL/hr at 01/03/24 1037 2 g at 01/03/24 1037   Chlorhexidine  Gluconate Cloth 2 % PADS 6 each  6 each Topical Daily Gretta Leita SQUIBB, DO   6 each at 01/03/24 9092   docusate sodium  (COLACE) capsule 100 mg  100 mg Oral BID PRN Gretta Leita P, DO       DULoxetine  (CYMBALTA ) DR capsule 60 mg  60 mg Oral Daily Everhart, Kirstie, DO   60 mg at 01/03/24 9092   gabapentin  (NEURONTIN ) capsule 500 mg  500 mg Oral TID Gretta Leita P, DO   500 mg at 01/03/24 9092   guaiFENesin  (MUCINEX ) 12 hr tablet 600  mg  600 mg Oral BID PRN Everhart, Kirstie, DO       HYDROmorphone  (DILAUDID ) injection 0.5 mg  0.5 mg Intravenous Q4H PRN Gretta Doffing P, DO   0.5 mg at 01/03/24 1032   insulin  aspart (novoLOG ) injection 0-15 Units  0-15 Units Subcutaneous TID WC Gretta Doffing SQUIBB, DO   2 Units at 01/01/24 1629   insulin  aspart (novoLOG ) injection 0-5 Units  0-5 Units Subcutaneous QHS Gretta Doffing P, DO       ipratropium-albuterol  (DUONEB) 0.5-2.5 (3) MG/3ML nebulizer solution 3 mL  3 mL Nebulization Q4H PRN Gretta Doffing P, DO       levothyroxine  (SYNTHROID ) tablet 75 mcg  75 mcg Oral Q0600 Gretta Doffing P, DO   75 mcg at 01/03/24 0543   memantine  (NAMENDA ) tablet 10 mg  10 mg Oral BID Everhart, Kirstie, DO   10 mg at 01/03/24 9093   metoprolol  succinate (TOPROL -XL) 24 hr tablet 25 mg  25 mg Oral Daily Arloa Folks  D, NP   25 mg at 01/03/24 9092   ondansetron  (ZOFRAN ) injection 4 mg  4 mg Intravenous Q6H PRN Gretta Doffing P, DO   4 mg at 01/03/24 9357   Oral care mouth rinse  15 mL Mouth Rinse PRN Gretta Doffing P, DO       oxyCODONE  (Oxy IR/ROXICODONE ) immediate release tablet 10 mg  10 mg Oral Q6H PRN Gretta Doffing SQUIBB, DO   10 mg at 01/03/24 9092   pantoprazole  (PROTONIX ) EC tablet 40 mg  40 mg Oral BID Everhart, Kirstie, DO   40 mg at 01/03/24 0907   polyethylene glycol (MIRALAX  / GLYCOLAX ) packet 17 g  17 g Oral Daily PRN Gretta Doffing P, DO       pramipexole  (MIRAPEX ) tablet 0.25 mg  0.25 mg Oral BID Gretta Doffing P, DO   0.25 mg at 01/03/24 9092   revefenacin  (YUPELRI ) nebulizer solution 175 mcg  175 mcg Nebulization Daily Gretta Doffing SQUIBB, DO   175 mcg at 01/03/24 0846   ticagrelor  (BRILINTA ) tablet 90 mg  90 mg Oral BID Everhart, Kirstie, DO   90 mg at 01/03/24 0907     Discharge Medications: Please see discharge summary for a list of discharge medications.  Relevant Imaging Results:  Relevant Lab Results:   Additional Information SSN: [id number]  Jeoffrey LITTIE Moose, LCSW

## 2024-01-03 NOTE — Progress Notes (Signed)
 PROGRESS NOTE    Amy Mckay  FMW:[id number] DOB: May 21, 1935 DOA: 12/31/2023 PCP: Karlene Mungo, MD    Brief Narrative:  89 year old female with history of COPD on 3 L oxygen at baseline, chronic anemia due to rectal bleeding secondary to prolapse presented after an unwitnessed fall out of bed.  Patient was noted to have active bleeding from right upper extremity, trauma series negative.  Received 3 units of whole blood and remained hypotensive, transiently on norepinephrine .  Patient from a skilled nursing facility.  Reported progressive weakness and urinary frequency last few days.  Admitted to ICU stabilized and transferred to the floor now.  Patient does have a history significant for esophageal dysphagia and frequent aspiration.  Subjective: Patient seen and examined.  Came back from swallow evaluation.  She did fairly well on modified diet.  Has a lot of pain at the anus.  No prolapse today.  She has frequent loose stool that is not bloody today.   Assessment & Plan:   Shock, concern for sepsis. Presented with WBC count 22 and hypotension. Urinalysis was without obvious evidence of infection. Blood cultures, negative CT scan chest abdomen pelvis, bronchiolitis, possible aspiration.  Significant history of esophageal dysphagia. Treating as aspiration pneumonia. Currently patient is off vasopressors.  Blood pressures are maintained. Remains on ceftriaxone  and azithromycin , will complete 5 days therapy. Speech therapy following.  Chronic respiratory failure with hypoxemia On 3 L oxygen at home.  Now is stable.  On Brovana , Yupelri , DuoNebs,  Anemia of acute blood loss, hematoma right upper extremity: Compressive dressing.  Received 3 PRBCs.  Hemoglobin is stable.  Blood loss mostly from rectal bleeding from prolapsed rectum. Recheck hemoglobin tomorrow morning.  Right lower extremity edema: Asymmetrical swelling of the extremities. Duplex is negative for DVT. 2D echocardiogram  with echocardiogram findings 60 to 65% ejection fraction, comparable to previous.  Grade 1 diastolic dysfunction. Ecchymosis and swelling right arm.  Hypothyroidism: On Synthroid .  Continue.  Peripheral vascular disease status post stents: Back on aspirin  and Brilinta .    DVT prophylaxis: Place and maintain sequential compression device Start: 12/31/23 1305   Code Status: DNR with limited intervention Family Communication: None at the bedside Disposition Plan: Status is: Inpatient Remains inpatient appropriate because: On antibiotics.  Possibly can go back to nursing home tomorrow.     Consultants:  Trauma Critical care  Procedures:  None  Antimicrobials:  Rocephin  azithromycin  7/18---     Objective: Vitals:   01/03/24 0800 01/03/24 0846 01/03/24 0848 01/03/24 1133  BP:    111/84  Pulse:  (!) 112  (!) 110  Resp:  19  16  Temp: 98.5 F (36.9 C)   98.3 F (36.8 C)  TempSrc:    Oral  SpO2:  94% 94% 96%  Weight:      Height:        Intake/Output Summary (Last 24 hours) at 01/03/2024 1312 Last data filed at 01/03/2024 0600 Gross per 24 hour  Intake 150 ml  Output 850 ml  Net -700 ml   Filed Weights   12/31/23 1254 01/02/24 0500 01/03/24 0500  Weight: 72.3 kg 72.6 kg 80.5 kg    Examination:  General: Chronically sick looking.  Age appropriate.  In moderate discomfort on mobility today.  Frail and debilitated. Cardiovascular: S1-S2 normal.  Regular rate rhythm.   Respiratory: Bilateral conducted upper airway sounds.  On 3 L oxygen. Gastrointestinal: Soft and nontender.  Bowel sounds present. Ext: 1+ edema both legs. Neuro: Mostly alert awake and oriented.  Data Reviewed: I have personally reviewed following labs and imaging studies  CBC: Recent Labs  Lab 12/31/23 1552 01/01/24 0426 01/01/24 1633 01/02/24 0752 01/02/24 1704  WBC 17.8* 17.3* 18.4* 16.7* 16.3*  HGB 13.8 13.6 13.8 14.4 14.7  HCT 41.0 40.4 41.1 44.8 45.2  MCV 86.0 85.6 86.7  86.8 87.1  PLT 333 336 345 333 336   Basic Metabolic Panel: Recent Labs  Lab 12/31/23 1040 12/31/23 1103 01/01/24 0426 01/02/24 0752 01/03/24 0710  NA 137 138 138 136 137  K 3.0* 2.9* 4.0 4.0 3.7  CL 96* 95* 96* 94* 95*  CO2 25  --  31 26 28   GLUCOSE 145* 145* 131* 96 103*  BUN 26* 27* 26* 28* 18  CREATININE 1.11* 1.10* 1.15* 0.99 0.91  CALCIUM  8.2*  --  9.4 9.4 9.5   GFR: Estimated Creatinine Clearance: 41.9 mL/min (by C-G formula based on SCr of 0.91 mg/dL). Liver Function Tests: Recent Labs  Lab 12/31/23 1040  AST 36  ALT 23  ALKPHOS 63  BILITOT 0.7  PROT 6.5  ALBUMIN 2.8*   No results for input(s): LIPASE, AMYLASE in the last 168 hours. No results for input(s): AMMONIA in the last 168 hours. Coagulation Profile: Recent Labs  Lab 12/31/23 1040  INR 1.1   Cardiac Enzymes: Recent Labs  Lab 12/31/23 1040  CKTOTAL 139   BNP (last 3 results) No results for input(s): PROBNP in the last 8760 hours. HbA1C: Recent Labs    12/31/23 1327  HGBA1C 5.5   CBG: Recent Labs  Lab 01/02/24 1205 01/02/24 1638 01/02/24 2154 01/03/24 0806 01/03/24 1155  GLUCAP 112* 108* 103* 104* 116*   Lipid Profile: No results for input(s): CHOL, HDL, LDLCALC, TRIG, CHOLHDL, LDLDIRECT in the last 72 hours. Thyroid Function Tests: No results for input(s): TSH, T4TOTAL, FREET4, T3FREE, THYROIDAB in the last 72 hours.  Anemia Panel: No results for input(s): VITAMINB12, FOLATE, FERRITIN, TIBC, IRON, RETICCTPCT in the last 72 hours. Sepsis Labs: Recent Labs  Lab 12/31/23 1103 12/31/23 1721 01/01/24 0426  LATICACIDVEN 2.3* 3.6* 0.9    Recent Results (from the past 240 hours)  MRSA Next Gen by PCR, Nasal     Status: None   Collection Time: 12/31/23 12:52 PM   Specimen: Nasal Mucosa; Nasal Swab  Result Value Ref Range Status   MRSA by PCR Next Gen NOT DETECTED NOT DETECTED Final    Comment: (NOTE) The GeneXpert MRSA Assay (FDA  approved for NASAL specimens only), is one component of a comprehensive MRSA colonization surveillance program. It is not intended to diagnose MRSA infection nor to guide or monitor treatment for MRSA infections. Test performance is not FDA approved in patients less than 62 years old. Performed at [facility], [address], [place], [place] [address]   Culture, blood (Routine X 2) w Reflex to ID Panel     Status: None (Preliminary result)   Collection Time: 12/31/23  1:27 PM   Specimen: BLOOD LEFT ARM  Result Value Ref Range Status   Specimen Description BLOOD LEFT ARM  Final   Special Requests   Final    BOTTLES DRAWN AEROBIC ONLY Blood Culture results may not be optimal due to an inadequate volume of blood received in culture bottles   Culture   Final    NO GROWTH 3 DAYS Performed at [facility], [address], [place], [place] [address]    Report Status PENDING  Incomplete  Culture, blood (Routine X 2)  w Reflex to ID Panel     Status: None (Preliminary result)   Collection Time: 12/31/23  2:51 PM   Specimen: BLOOD RIGHT HAND  Result Value Ref Range Status   Specimen Description BLOOD RIGHT HAND  Final   Special Requests   Final    BOTTLES DRAWN AEROBIC AND ANAEROBIC Blood Culture results may not be optimal due to an inadequate volume of blood received in culture bottles   Culture   Final    NO GROWTH 3 DAYS Performed at [facility], [address], [place], [place] [address]    Report Status PENDING  Incomplete         Radiology Studies: VAS US  LOWER EXTREMITY VENOUS (DVT) Result Date: 01/03/2024  Lower Venous DVT Study Patient Name:  LORAN AUGUSTE  Date of Exam:   01/03/2024 Medical Rec #: [id number]      Accession #:    [id number] Date of Birth: Feb 28, 1935      Patient Gender: F Patient Age:   79 years Exam Location:  [facility] Procedure:      VAS US  LOWER EXTREMITY VENOUS (DVT) Referring Phys: LEITA GASKINS  --------------------------------------------------------------------------------  Indications: Edema.  Performing Technologist: Elmarie Lindau, RVT  Examination Guidelines: A complete evaluation includes B-mode imaging, spectral Doppler, color Doppler, and power Doppler as needed of all accessible portions of each vessel. Bilateral testing is considered an integral part of a complete examination. Limited examinations for reoccurring indications may be performed as noted. The reflux portion of the exam is performed with the patient in reverse Trendelenburg.  +---------+---------------+---------+-----------+----------+--------------+ RIGHT    CompressibilityPhasicitySpontaneityPropertiesThrombus Aging +---------+---------------+---------+-----------+----------+--------------+ CFV      Full           Yes      Yes                                 +---------+---------------+---------+-----------+----------+--------------+ SFJ      Full                                                        +---------+---------------+---------+-----------+----------+--------------+ FV Prox  Full                                                        +---------+---------------+---------+-----------+----------+--------------+ FV Mid   Full                                                        +---------+---------------+---------+-----------+----------+--------------+ FV DistalFull                                                        +---------+---------------+---------+-----------+----------+--------------+ PFV      Full                                                        +---------+---------------+---------+-----------+----------+--------------+  POP      Full           Yes      Yes                                 +---------+---------------+---------+-----------+----------+--------------+ PTV      Full                                                         +---------+---------------+---------+-----------+----------+--------------+ PERO     Full                                                        +---------+---------------+---------+-----------+----------+--------------+   +----+---------------+---------+-----------+----------+--------------+ LEFTCompressibilityPhasicitySpontaneityPropertiesThrombus Aging +----+---------------+---------+-----------+----------+--------------+ CFV Full           Yes      Yes                                 +----+---------------+---------+-----------+----------+--------------+     Summary: RIGHT: - There is no evidence of deep vein thrombosis in the lower extremity. However, portions of this examination were limited- see technologist comments above.  - No cystic structure found in the popliteal fossa.  LEFT: - No evidence of common femoral vein obstruction.   *See table(s) above for measurements and observations. Electronically signed by Lonni Gaskins MD on 01/03/2024 at 12:21:54 PM.    Final    DG Swallowing Func-Speech Pathology Result Date: 01/03/2024 Table formatting from the original result was not included. Images from the original result were not included. Modified Barium Swallow Study Patient Details Name: Amy Mckay MRN: [id number] Date of Birth: [birth year] Today's Date: 01/03/2024 HPI/PMH: HPI: 89yo F PMHx COPD on 3L at baseline, chronic anemia due to rectal bleeding 2/2 prolapse presented after unwitnessed fall out of bed on Brillinta. Active bleeding from RUE but otherwise no traumatic injuries. Received 3U whole blood and remained hypotensive so was started on norepinephrine . Comes from rehab facility, reportedly with progressive weakness, urinary frequency, and dysuria over the last few days. Pt with concern for septic shock with pulmonary source. Suspected to aspiration etiology per workup and pt dysphagia report. Pt demonstrates a moderate dysphagia secondary to prominent cricopharyngeus  (criphayrngeal bar). Pt has poor dentition but reports she is going to get new dentures very soon. She is able to masticate a graham cracker with her few lower front dentition and does a thorough job. Though suspect there are textures that would be unmaneageable. Pharyngeal function with liquids and puree is adequate with decrease PES relaxation, but no significant residue. There is initiation of hyoid excursion with bolus in the closed vestibule and pyriforms, decreased epiglottic deflection, and very trace frank penetration of thin liquids without sensation. When pt was given a pill whole in puree it would not pass the CP bar despite multiple puree boluses. Pt then had some backflow to the oropharynx and also orally regurgitated the pill. Pt says this happens sometimes with water only, though not during exam. Would advise pt to be very selective about the textures of food  she attempts, but already has a great deal of awareness. Pt could f/u with GI/ENT if desired, though not sure that she would be a candidate for intervention. Pt also reports night time reflux and other issues that could increase risk of aspiration. Pt can continue a dys 3 Mech soft diet if she tolerates it, but may ultimately need purees. Factors that may increase risk of adverse event in presence of aspiration Noe & Lianne 2021): Factors that may increase risk of adverse event in presence of aspiration Noe & Lianne 2021): Frail or deconditioned Recommendations/Plan: Swallowing Evaluation Recommendations Swallowing Evaluation Recommendations Liquid Administration via: Cup; Straw Medication Administration: Whole meds with puree Postural changes: Position pt fully upright for meals; Stay upright 30-60 min after meals Oral care recommendations: Oral care BID (2x/day) Recommended consults: Consider esophageal assessment Treatment Plan Treatment Plan Treatment recommendations: Therapy as outlined in treatment plan below Follow-up  recommendations: Follow physicians's recommendations for discharge plan and follow up therapies Treatment frequency: Min 2x/week Treatment duration: 1 week Interventions: Diet toleration management by SLP Recommendations Recommendations for follow up therapy are one component of a multi-disciplinary discharge planning process, led by the attending physician.  Recommendations may be updated based on patient status, additional functional criteria and insurance authorization. Assessment: Orofacial Exam: Orofacial Exam Oral Cavity - Dentition: Missing dentition Anatomy: No data recorded Boluses Administered: Boluses Administered Boluses Administered: Thin liquids (Level 0); Mildly thick liquids (Level 2, nectar thick); Puree; Solid  Oral Impairment Domain: Oral Impairment Domain Lip Closure: No labial escape Tongue control during bolus hold: Posterior escape of less than half of bolus Bolus preparation/mastication: Slow prolonged chewing/mashing with complete recollection Bolus transport/lingual motion: Brisk tongue motion Oral residue: Complete oral clearance Initiation of pharyngeal swallow : Pyriform sinuses  Pharyngeal Impairment Domain: Pharyngeal Impairment Domain Soft palate elevation: No bolus between soft palate (SP)/pharyngeal wall (PW) Laryngeal elevation: Complete superior movement of thyroid cartilage with complete approximation of arytenoids to epiglottic petiole Anterior hyoid excursion: Complete anterior movement Epiglottic movement: Partial inversion Laryngeal vestibule closure: Incomplete, narrow column air/contrast in laryngeal vestibule Pharyngeal stripping wave : Present - complete Pharyngoesophageal segment opening: Partial distention/partial duration, partial obstruction of flow Tongue base retraction: No contrast between tongue base and posterior pharyngeal wall (PPW) Pharyngeal residue: Trace residue within or on pharyngeal structures Location of pharyngeal residue: Valleculae  Esophageal  Impairment Domain: No data recorded Pill: Pill Consistency administered: Puree Puree: Impaired (see clinical impressions) Penetration/Aspiration Scale Score: Penetration/Aspiration Scale Score 1.  Material does not enter airway: Mildly thick liquids (Level 2, nectar thick); Puree; Solid; Pill 5.  Material enters airway, CONTACTS cords and not ejected out: Thin liquids (Level 0) Compensatory Strategies: No data recorded  General Information: Caregiver present: No  Diet Prior to this Study: Thin liquids (Level 0); Dysphagia 3 (mechanical soft)   No data recorded  No data recorded  No data recorded  No data recorded Behavior/Cognition: Alert; Cooperative; Pleasant mood Self-Feeding Abilities: Able to self-feed No data recorded No data recorded Volitional Swallow: Able to elicit Exam Limitations: No limitations Goal Planning: Prognosis for improved oropharyngeal function: Good No data recorded No data recorded Patient/Family Stated Goal: none stated No data recorded Pain: No data recorded End of Session: Start Time:SLP Start Time (ACUTE ONLY): 9072 Stop Time: SLP Stop Time (ACUTE ONLY): 0944 Time Calculation:SLP Time Calculation (min) (ACUTE ONLY): 17 min Charges: SLP Evaluations $ SLP Speech Visit: 1 Visit SLP Evaluations $BSS Swallow: 1 Procedure $MBS Swallow: 1 Procedure SLP visit diagnosis: SLP Visit Diagnosis: Dysphagia,  pharyngoesophageal phase (R13.14) Past Medical History: Past Medical History: Diagnosis Date  COPD (chronic obstructive pulmonary disease) (HCC)   GERD (gastroesophageal reflux disease)   Hypertension  Past Surgical History: Past Surgical History: Procedure Laterality Date  COLONOSCOPY    ESOPHAGOGASTRODUODENOSCOPY   Consuelo Fort, MA CCC-SLP [facility] Secure Chat Preferred Office [phone] [place] 01/03/2024, 10:21 AM  ECHOCARDIOGRAM COMPLETE Result Date: 01/01/2024    ECHOCARDIOGRAM REPORT   Patient Name:   SHANTERA MONTS Date of Exam: 01/01/2024 Medical  Rec #:  [id number]     Height:       61.0 in Accession #:    [id number]    Weight:       159.4 lb Date of Birth:  Oct 13, 1934     BSA:          1.715 m Patient Age:    89 years      BP:           135/98 mmHg Patient Gender: F             HR:           102 bpm. Exam Location:  Inpatient Procedure: 2D Echo, Color Doppler and Cardiac Doppler (Both Spectral and Color            Flow Doppler were utilized during procedure). Indications:    Shock  History:        Patient has no prior history of Echocardiogram examinations.                 COPD; Risk Factors:Hypertension.  Sonographer:    Jayson Gaskins Referring Phys: [phone] MATTHEW R HUNSUCKER IMPRESSIONS  1. Left ventricular ejection fraction, by estimation, is 60 to 65%. The left ventricle has normal function. The left ventricle has no regional wall motion abnormalities. Left ventricular diastolic parameters are consistent with Grade I diastolic dysfunction (impaired relaxation).  2. Right ventricular systolic function is normal. The right ventricular size is normal.  3. The mitral valve is normal in structure. No evidence of mitral valve regurgitation. No evidence of mitral stenosis. Severe mitral annular calcification.  4. The aortic valve is normal in structure. Aortic valve regurgitation is not visualized. No aortic stenosis is present.  5. The inferior vena cava is normal in size with greater than 50% respiratory variability, suggesting right atrial pressure of 3 mmHg. FINDINGS  Left Ventricle: Left ventricular ejection fraction, by estimation, is 60 to 65%. The left ventricle has normal function. The left ventricle has no regional wall motion abnormalities. The left ventricular internal cavity size was normal in size. There is  no left ventricular hypertrophy. Left ventricular diastolic parameters are consistent with Grade I diastolic dysfunction (impaired relaxation). Right Ventricle: The right ventricular size is normal. No increase in right ventricular wall  thickness. Right ventricular systolic function is normal. Left Atrium: Left atrial size was normal in size. Right Atrium: Right atrial size was normal in size. Pericardium: There is no evidence of pericardial effusion. Mitral Valve: The mitral valve is normal in structure. Severe mitral annular calcification. No evidence of mitral valve regurgitation. No evidence of mitral valve stenosis. Tricuspid Valve: The tricuspid valve is normal in structure. Tricuspid valve regurgitation is not demonstrated. No evidence of tricuspid stenosis. Aortic Valve: The aortic valve is normal in structure. Aortic valve regurgitation is not visualized. No aortic stenosis is present. Aortic valve mean gradient measures 2.0 mmHg. Aortic valve peak gradient measures 3.5 mmHg. Aortic valve area, by VTI measures 2.82 cm.  Pulmonic Valve: The pulmonic valve was normal in structure. Pulmonic valve regurgitation is not visualized. No evidence of pulmonic stenosis. Aorta: The aortic root is normal in size and structure. Venous: The inferior vena cava is normal in size with greater than 50% respiratory variability, suggesting right atrial pressure of 3 mmHg. IAS/Shunts: No atrial level shunt detected by color flow Doppler.  LEFT VENTRICLE PLAX 2D LVOT diam:     1.80 cm   Diastology LV SV:         48        LV e' medial:    6.20 cm/s LV SV Index:   28        LV E/e' medial:  10.4 LVOT Area:     2.54 cm  LV e' lateral:   6.96 cm/s                          LV E/e' lateral: 9.3  RIGHT VENTRICLE RV S prime:     17.50 cm/s TAPSE (M-mode): 2.0 cm LEFT ATRIUM             Index        RIGHT ATRIUM           Index LA Vol (A2C):   28.0 ml 16.33 ml/m  RA Area:     11.70 cm LA Vol (A4C):   32.0 ml 18.66 ml/m  RA Volume:   24.40 ml  14.23 ml/m LA Biplane Vol: 31.5 ml 18.37 ml/m  AORTIC VALVE AV Area (Vmax):    2.33 cm AV Area (Vmean):   2.16 cm AV Area (VTI):     2.82 cm AV Vmax:           93.20 cm/s AV Vmean:          72.000 cm/s AV VTI:             0.169 m AV Peak Grad:      3.5 mmHg AV Mean Grad:      2.0 mmHg LVOT Vmax:         85.40 cm/s LVOT Vmean:        61.200 cm/s LVOT VTI:          0.187 m LVOT/AV VTI ratio: 1.11  AORTA Ao Root diam: 3.10 cm MITRAL VALVE MV Area (PHT): 4.06 cm     SHUNTS MV Decel Time: 187 msec     Systemic VTI:  0.19 m MV E velocity: 64.50 cm/s   Systemic Diam: 1.80 cm MV A velocity: 144.00 cm/s MV E/A ratio:  0.45 Oneil Parchment MD Electronically signed by Oneil Parchment MD Signature Date/Time: 01/01/2024/5:03:24 PM    Final         Scheduled Meds:  arformoterol   15 mcg Nebulization BID   aspirin   81 mg Oral Daily   aspirin  EC  81 mg Oral Daily   atorvastatin   80 mg Oral Daily   Chlorhexidine  Gluconate Cloth  6 each Topical Daily   DULoxetine   60 mg Oral Daily   gabapentin   500 mg Oral TID   insulin  aspart  0-15 Units Subcutaneous TID WC   insulin  aspart  0-5 Units Subcutaneous QHS   levothyroxine   75 mcg Oral Q0600   memantine   10 mg Oral BID   metoprolol  succinate  25 mg Oral Daily   pantoprazole   40 mg Oral BID   pramipexole   0.25 mg Oral BID   revefenacin   175 mcg Nebulization Daily  ticagrelor   90 mg Oral BID   Continuous Infusions:  cefTRIAXone  (ROCEPHIN )  IV 2 g (01/03/24 1037)     LOS: 3 days    Time spent: 52 minutes    Renato Applebaum, MD [facility]

## 2024-01-03 NOTE — Evaluation (Addendum)
 Occupational Therapy Evaluation Patient Details Name: Amy Mckay MRN: [id number] DOB: [birth year] Today's Date: 01/03/2024   History of Present Illness   Pt is an 89 y.o. F presenting to [facility] from skilled nursing facility on 12/31/23 following a fall, sustaining rectal bleed secondary to prolapse. PMH is significant for COPD on 3L, chronic anemia, GERD, HTN, PAD, and hypothyroidism.     Clinical Impressions Patient admitted for the diagnosis above.  Patient is from a SNF, however she states she pushes her w/c short distances and needed no assist for bathing and dressing.  Patient is self limiting OOB to the recliner due to R knee pain, RN in the room discussing IV pain meds.  Patient was able to sit EOB with supervision and stand to take a few side steps with CGA/HHA due to knee pain.  OT can follow in the acute setting to address deficits listed below, and patient plans on a return to her facility.  OT will defer to facility OT to determine post acute needs.        If plan is discharge home, recommend the following:   A little help with walking and/or transfers;A little help with bathing/dressing/bathroom     Functional Status Assessment   Patient has had a recent decline in their functional status and demonstrates the ability to make significant improvements in function in a reasonable and predictable amount of time.     Equipment Recommendations   None recommended by OT     Recommendations for Other Services         Precautions/Restrictions   Precautions Precautions: Fall Precaution/Restrictions Comments: Chronic O2 Restrictions Weight Bearing Restrictions Per Provider Order: No     Mobility Bed Mobility Overal bed mobility: Needs Assistance Bed Mobility: Supine to Sit, Sit to Supine     Supine to sit: Supervision Sit to supine: Supervision     Patient Response: Cooperative  Transfers Overall transfer level: Needs assistance Equipment used: 1 person  hand held assist Transfers: Sit to/from Stand Sit to Stand: Contact guard assist           General transfer comment: knee pain      Balance Overall balance assessment: Needs assistance Sitting-balance support: Feet supported Sitting balance-Leahy Scale: Good     Standing balance support: Single extremity supported Standing balance-Leahy Scale: Fair                 High Level Balance Comments: knee pain           ADL either performed or assessed with clinical judgement   ADL       Grooming: Set up;Sitting               Lower Body Dressing: Minimal assistance;Sit to/from stand   Toilet Transfer: Contact guard assist;Minimal assistance;Stand-pivot                   Vision Patient Visual Report: No change from baseline       Perception Perception: Not tested       Praxis Praxis: Not tested       Pertinent Vitals/Pain Pain Assessment Pain Assessment: Faces Faces Pain Scale: Hurts even more Pain Location: R upper arm and R knee Pain Descriptors / Indicators: Grimacing, Guarding, Sharp Pain Intervention(s): Monitored during session, Patient requesting pain meds-RN notified     Extremity/Trunk Assessment Upper Extremity Assessment Upper Extremity Assessment: Overall WFL for tasks assessed   Lower Extremity Assessment Lower Extremity Assessment: Defer to PT evaluation  Cervical / Trunk Assessment Cervical / Trunk Assessment: Kyphotic   Communication Communication Communication: No apparent difficulties   Cognition Arousal: Alert Behavior During Therapy: WFL for tasks assessed/performed Cognition: No apparent impairments                               Following commands: Intact       Cueing  General Comments   Cueing Techniques: Verbal cues   VSS   Exercises     Shoulder Instructions      Home Living Family/patient expects to be discharged to:: Skilled nursing facility                                         Prior Functioning/Environment Prior Level of Function : Needs assist             Mobility Comments: States that she pushes her w/c in the room for mobility. 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    OT Problem List: Pain   OT Treatment/Interventions: Self-care/ADL training;Therapeutic activities;Patient/family education;Balance training      OT Goals(Current goals can be found in the care plan section)   Acute Rehab OT Goals Patient Stated Goal: Return home OT Goal Formulation: With patient Time For Goal Achievement: 01/17/24 Potential to Achieve Goals: Good ADL Goals Pt Will Perform Grooming: with modified independence;standing;sitting Pt Will Perform Lower Body Dressing: with modified independence;sit to/from stand Pt Will Transfer to Toilet: with modified independence;ambulating;regular height toilet   OT Frequency:  Min 2X/week    Co-evaluation              AM-PAC OT 6 Clicks Daily Activity     Outcome Measure Help from another person eating meals?: None Help from another person taking care of personal grooming?: A Little Help from another person toileting, which includes using toliet, bedpan, or urinal?: A Little Help from another person bathing (including washing, rinsing, drying)?: A Little Help from another person to put on and taking off regular upper body clothing?: None Help from another person to put on and taking off regular lower body clothing?: A Little 6 Click Score: 20   End of Session Equipment Utilized During Treatment: Gait belt Nurse Communication: Mobility status  Activity Tolerance: Patient tolerated treatment well Patient left: in bed;with call bell/phone within reach;with nursing/sitter in room  OT Visit Diagnosis: Unsteadiness on feet (R26.81);Pain Pain - Right/Left: Right Pain - part of body: Arm;Knee                Time: 1010-1034 OT Time Calculation (min): 24 min Charges:  OT  General Charges $OT Visit: 1 Visit OT Evaluation $OT Eval Moderate Complexity: 1 Mod OT Treatments $Self Care/Home Management : 8-22 mins  01/03/2024  RP, OTR/L  Acute Rehabilitation Services  Office:  [phone]   Charlie JONETTA Halsted 01/03/2024, 10:43 AM

## 2024-01-03 NOTE — Evaluation (Signed)
 Physical Therapy Evaluation Patient Details Name: Amy Mckay MRN: [id number] DOB: 1934/08/25 Today's Date: 01/03/2024  History of Present Illness  Pt is an 89 y.o. F presenting to [facility] from skilled nursing facility on 12/31/23 following a fall, sustaining rectal bleed secondary to prolapse. PMH is significant for COPD on 3L, chronic anemia, GERD, HTN, PAD, and hypothyroidism.  Clinical Impression  Pt presents to [facility] from skilled nursing facility following a fall. Pt reports prior to admittance she was mobilizing via pushing a WC (like a rollator) and was needing assistance with bathing. Pt presents to session with deficits in mobility, strength, power, balance, activity tolerance, and pain, all limiting pt's ability to mobilize near baseline. Pt was able to perform squat pivot transfer from edge of bed to recliner without an AD and no physical assistance given. Pt would benefit from further transfer, and gait training. PT will continue to treat pt while she is admitted. Patient will benefit from continued inpatient follow up therapy, <3 hours/day.        If plan is discharge home, recommend the following: A little help with walking and/or transfers;A little help with bathing/dressing/bathroom;Assist for transportation   Can travel by private vehicle        Equipment Recommendations None recommended by PT  Recommendations for Other Services       Functional Status Assessment Patient has had a recent decline in their functional status and demonstrates the ability to make significant improvements in function in a reasonable and predictable amount of time.     Precautions / Restrictions Precautions Precautions: Fall Recall of Precautions/Restrictions: Intact Restrictions Weight Bearing Restrictions Per Provider Order: No      Mobility  Bed Mobility Overal bed mobility: Needs Assistance Bed Mobility: Supine to Sit, Rolling Rolling: Supervision, Used rails   Supine to sit:  Supervision, HOB elevated     General bed mobility comments: Pt rolled L and R x2 for pericare, and completed sup>sit w/ supervision. VC given for sequencing and to use bed rails. Increased time and effort to complete    Transfers Overall transfer level: Needs assistance Equipment used: None Transfers: Sit to/from Stand, Bed to chair/wheelchair/BSC Sit to Stand: Contact guard assist     Squat pivot transfers: Contact guard assist     General transfer comment: STS from edge of bed w/out an AD and no physical assistance given. Pt does not obtain full upright and stabilizes self on arm rest of recliner. Pt then completed squat pivot transfer from edge of bed to recliner on the R, using arm of recliner for stability.    Ambulation/Gait                  Stairs            Wheelchair Mobility     Tilt Bed    Modified Rankin (Stroke Patients Only)       Balance Overall balance assessment: Needs assistance Sitting-balance support: No upper extremity supported, Feet supported Sitting balance-Leahy Scale: Good Sitting balance - Comments: seated EOB   Standing balance support: Single extremity supported, During functional activity, Reliant on assistive device for balance Standing balance-Leahy Scale: Poor Standing balance comment: pt reliant on external support of arm of recliner for stability                             Pertinent Vitals/Pain Pain Assessment Pain Assessment: Faces Faces Pain Scale: Hurts little more Pain Location: L abdomen  and R upper arm Pain Descriptors / Indicators: Discomfort, Grimacing, Guarding, Moaning, Spasm Pain Intervention(s): Limited activity within patient's tolerance, Monitored during session    Home Living Family/patient expects to be discharged to:: Skilled nursing facility                        Prior Function Prior Level of Function : Needs assist             Mobility Comments: States that she  pushes her w/c (like a rollator) in the room for mobility. Pt reports she needs special shoes in order to stand upright. 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     Extremity/Trunk Assessment   Upper Extremity Assessment Upper Extremity Assessment: Defer to OT evaluation    Lower Extremity Assessment Lower Extremity Assessment: Generalized weakness    Cervical / Trunk Assessment Cervical / Trunk Assessment: Kyphotic  Communication   Communication Communication: No apparent difficulties    Cognition Arousal: Alert Behavior During Therapy: WFL for tasks assessed/performed   PT - Cognitive impairments: No apparent impairments                         Following commands: Intact       Cueing Cueing Techniques: Verbal cues, Visual cues     General Comments General comments (skin integrity, edema, etc.): no signs of acute distress    Exercises     Assessment/Plan    PT Assessment Patient needs continued PT services  PT Problem List Decreased strength;Decreased activity tolerance;Decreased balance;Decreased mobility;Decreased knowledge of use of DME;Pain       PT Treatment Interventions DME instruction;Gait training;Functional mobility training;Therapeutic activities;Therapeutic exercise;Balance training;Patient/family education;Wheelchair mobility training;Manual techniques;Modalities    PT Goals (Current goals can be found in the Care Plan section)  Acute Rehab PT Goals Patient Stated Goal: to feel better PT Goal Formulation: With patient Time For Goal Achievement: 01/17/24 Potential to Achieve Goals: Good    Frequency Min 1X/week     Co-evaluation               AM-PAC PT 6 Clicks Mobility  Outcome Measure Help needed turning from your back to your side while in a flat bed without using bedrails?: A Little Help needed moving from lying on your back to sitting on the side of a flat bed without using bedrails?: A Little Help  needed moving to and from a bed to a chair (including a wheelchair)?: A Little Help needed standing up from a chair using your arms (e.g., wheelchair or bedside chair)?: A Little Help needed to walk in hospital room?: Total Help needed climbing 3-5 steps with a railing? : Total 6 Click Score: 14    End of Session Equipment Utilized During Treatment: Gait belt;Oxygen (3.5L) Activity Tolerance: Patient tolerated treatment well Patient left: in chair;with call bell/phone within reach;with chair alarm set Nurse Communication: Mobility status PT Visit Diagnosis: Unsteadiness on feet (R26.81);Muscle weakness (generalized) (M62.81);History of falling (Z91.81);Pain Pain - Right/Left: Right Pain - part of body: Shoulder    Time: 1243-1310 PT Time Calculation (min) (ACUTE ONLY): 27 min   Charges:   PT Evaluation $PT Eval Moderate Complexity: 1 Mod PT Treatments $Gait Training: 8-22 mins PT General Charges $$ ACUTE PT VISIT: 1 Visit         Leontine Hilt, SPT Acute Rehab [phone]   Leontine Hilt 01/03/2024, 2:57 PM

## 2024-01-03 NOTE — Progress Notes (Addendum)
 Modified Barium Swallow Study  Patient Details  Name: Amy Mckay MRN: [id number] Date of Birth: 04-16-1935  Today's Date: 01/03/2024  Modified Barium Swallow completed.  Full report located under Chart Review in the Imaging Section.  History of Present Illness 89yo F PMHx COPD on 3L at baseline, chronic anemia due to rectal bleeding 2/2 prolapse presented after unwitnessed fall out of bed on [place]. Active bleeding from RUE but otherwise no traumatic injuries. Received 3U whole blood and remained hypotensive so was started on norepinephrine . Comes from rehab facility, reportedly with progressive weakness, urinary frequency, and dysuria over the last few days. Pt with concern for septic shock with pulmonary source. Suspected to aspiration etiology per workup and pt dysphagia report.   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 Pt has poor dentition but reports she is going to get new dentures very soon. She is able to masticate a graham cracker with her few lower front dentition and does a thorough job. Though suspect there are textures that would be unmaneageable. Pharyngeal function with liquids and puree is adequate with decrease PES relaxation, but no significant residue. There is initiation of hyoid excursion with bolus in the closed vestibule and pyriforms, decreased epiglottic deflection, and very trace frank penetration of thin liquids without sensation. When pt was given a pill whole in puree it would not pass the CP bar despite multiple puree boluses. Pt then had some backflow to the oropharynx and also orally regurgitated the pill. Pt says this happens sometimes with water only, though not during exam. Would advise pt to be very selective about the textures of food she attempts, but already has a great deal of awareness. Pt could f/u with GI/ENT if desired, though not sure that she would be a candidate for  intervention. Pt also reports night time reflux and other issues that could increase risk of aspiration. Pt can continue a dys 3 Mech soft diet with thin liquids if she tolerates it, but may ultimately need purees. Factors that may increase risk of adverse event in presence of aspiration [employer]e [employer] Lianne 2021): Frail or deconditioned      Swallow Evaluation Recommendations Liquid Administration via: Cup;Straw Medication Administration: Whole meds with puree Postural changes: Position pt fully upright for meals;Stay upright 30-60 min after meals Oral care recommendations: Oral care BID (2x/day) Recommended consults: Consider esophageal assessment     Mckay Fort, MA CCC-SLP  Acute Rehabilitation Services Secure Chat Preferred Office [phone]  Amy Mckay Fitch 01/03/2024,10:19 AM

## 2024-01-04 ENCOUNTER — Encounter (INDEPENDENT_AMBULATORY_CARE_PROVIDER_SITE_OTHER): Payer: Self-pay

## 2024-01-04 DIAGNOSIS — R6521 Severe sepsis with septic shock: Secondary | ICD-10-CM | POA: Diagnosis not present

## 2024-01-04 DIAGNOSIS — A419 Sepsis, unspecified organism: Secondary | ICD-10-CM | POA: Diagnosis not present

## 2024-01-04 LAB — CBC WITH DIFFERENTIAL/PLATELET
Abs Immature Granulocytes: 0.96 K/uL — ABNORMAL HIGH (ref 0.00–0.07)
Basophils Absolute: 0.1 K/uL (ref 0.0–0.1)
Basophils Relative: 1 %
Eosinophils Absolute: 0.5 K/uL (ref 0.0–0.5)
Eosinophils Relative: 3 %
HCT: 46 % (ref 36.0–46.0)
Hemoglobin: 14.9 g/dL (ref 12.0–15.0)
Immature Granulocytes: 5 %
Lymphocytes Relative: 8 %
Lymphs Abs: 1.5 K/uL (ref 0.7–4.0)
MCH: 28.5 pg (ref 26.0–34.0)
MCHC: 32.4 g/dL (ref 30.0–36.0)
MCV: 88 fL (ref 80.0–100.0)
Monocytes Absolute: 1.4 K/uL — ABNORMAL HIGH (ref 0.1–1.0)
Monocytes Relative: 8 %
Neutro Abs: 13.4 K/uL — ABNORMAL HIGH (ref 1.7–7.7)
Neutrophils Relative %: 75 %
Platelets: 322 K/uL (ref 150–400)
RBC: 5.23 MIL/uL — ABNORMAL HIGH (ref 3.87–5.11)
RDW: 15.5 % (ref 11.5–15.5)
WBC: 17.8 K/uL — ABNORMAL HIGH (ref 4.0–10.5)
nRBC: 0 % (ref 0.0–0.2)

## 2024-01-04 LAB — BASIC METABOLIC PANEL WITH GFR
Anion gap: 14 (ref 5–15)
BUN: 17 mg/dL (ref 8–23)
CO2: 25 mmol/L (ref 22–32)
Calcium: 9.4 mg/dL (ref 8.9–10.3)
Chloride: 96 mmol/L — ABNORMAL LOW (ref 98–111)
Creatinine, Ser: 0.88 mg/dL (ref 0.44–1.00)
GFR, Estimated: >60 mL/min (ref >60)
Glucose, Bld: 114 mg/dL — ABNORMAL HIGH (ref 70–99)
Potassium: 3.8 mmol/L (ref 3.5–5.1)
Sodium: 135 mmol/L (ref 135–145)

## 2024-01-04 LAB — GLUCOSE, CAPILLARY
Glucose-Capillary: 108 mg/dL — ABNORMAL HIGH (ref 70–99)
Glucose-Capillary: 96 mg/dL (ref 70–99)

## 2024-01-04 MED ORDER — OXYCODONE HCL 15 MG PO TABS: 15.0000 mg | ORAL_TABLET | ORAL | 0 refills | Status: AC

## 2024-01-04 MED ORDER — OXYCODONE HCL 10 MG PO TABS: 10.0000 mg | ORAL_TABLET | Freq: Four times a day (QID) | ORAL | 0 refills | Status: AC

## 2024-01-04 NOTE — Progress Notes (Signed)
 Report given to Jemina, RN at  [facility]

## 2024-01-04 NOTE — TOC Progression Note (Signed)
 Transition of Care [facility]) - Progression Note    Patient Details  Name: Amy Mckay MRN: [id number] Date of Birth: March 03, 1935  Transition of Care [facility]) CM/SW Contact  Jullian Previti LITTIE Moose, LCSW Phone Number: 01/04/2024, 11:51 AM  Clinical Narrative:     CSW spoke with pt daughter, [name], to inform her that pt would be d/c back to [facility] today. [name] in agreement, CSW will call PTAR once report is called in.       Expected Discharge Plan and Services         Expected Discharge Date: 01/04/24                                     Social Determinants of Health (SDOH) Interventions SDOH Screenings   Food Insecurity: No Food Insecurity (12/31/2023)  Housing: Low Risk  (12/31/2023)  Transportation Needs: No Transportation Needs (12/31/2023)  Utilities: Not At Risk (12/31/2023)  Social Connections: Socially Isolated (12/31/2023)  Tobacco Use: Medium Risk (12/31/2023)    Readmission Risk Interventions     No data to display

## 2024-01-04 NOTE — Plan of Care (Signed)

## 2024-01-04 NOTE — Progress Notes (Signed)
 Patient was discharged via [facility] to [facility]. Patient was alert and oriented prior to discharge.

## 2024-01-04 NOTE — TOC Transition Note (Signed)
 Transition of Care [facility]) - Discharge Note   Patient Details  Name: Amy Mckay MRN: [id number] Date of Birth: 1934-06-20  Transition of [facility] CM/SW Contact:  Jeoffrey LITTIE Moose, LCSW Phone Number: 01/04/2024, 12:56 PM   Clinical Narrative:    Patient will DC to: [facility] Anticipated DC date: 01/04/24 Family notified: Yes Transport by: [facility]   Per MD patient ready for DC to [facility] . RN to call report prior to discharge [phone]. RN, patient, patient's family, and facility notified of DC. Discharge Summary and FL2 sent to facility. DC packet on chart including scripts. Ambulance transport requested for patient.   CSW will sign off for now as social work intervention is no longer needed. Please consult us  again if new needs arise.     Final next level of care: Long Term Nursing Home Barriers to Discharge: Barriers Resolved   Patient Goals and CMS Choice Patient states their goals for this hospitalization and ongoing recovery are:: LTC          Discharge Placement   Existing PASRR number confirmed : 01/04/24          Patient chooses bed at: [facility] Patient to be transferred to facility by: PTAR Name of family member notified: [name] Patient and family notified of of transfer: 01/04/24  Discharge Plan and Services Additional resources added to the After Visit Summary for                                       Social Drivers of Health (SDOH) Interventions SDOH Screenings   Food Insecurity: No Food Insecurity (12/31/2023)  Housing: Low Risk  (12/31/2023)  Transportation Needs: No Transportation Needs (12/31/2023)  Utilities: Not At Risk (12/31/2023)  Social Connections: Socially Isolated (12/31/2023)  Tobacco Use: Medium Risk (12/31/2023)     Readmission Risk Interventions     No data to display

## 2024-01-04 NOTE — Plan of Care (Signed)
  Problem: Pain Managment: Goal: General experience of comfort will improve and/or be controlled Outcome: Progressing   Problem: Safety: Goal: Ability to remain free from injury will improve Outcome: Progressing   Problem: Skin Integrity: Goal: Risk for impaired skin integrity will decrease Outcome: Progressing

## 2024-01-04 NOTE — Discharge Summary (Signed)
 Physician Discharge Summary  Amy Mckay FMW:[id number] DOB: Aug 28, 1934 DOA: 12/31/2023  PCP: Karlene Mungo, MD  Admit date: 12/31/2023 Discharge date: 01/04/2024  Admitted From: Long-term care nursing home Disposition: Long-term care nursing home  Recommendations for Outpatient Follow-up:  Follow up with PCP in 1-2 weeks Please obtain BMP/CBC in one week    Discharge Condition: Fair CODE STATUS: DNR with limited intervention Diet recommendation: Regular diet, nutritional supplements, bowel regiment  Discharge summary: 89 year old female with history of COPD on 3 L oxygen at baseline, chronic anemia due to rectal bleeding secondary to prolapse presented after an unwitnessed fall out of bed.  Patient lives in a nursing home as a long-term resident.  Patient was found with hematoma right arm.  Trauma series negative. Received 3 units of whole blood and remained hypotensive, transiently on norepinephrine .  Admitted to ICU stabilized and transferred to the floor.  Patient does have a history significant for esophageal dysphagia and frequent aspiration. Patient is stabilized and going back to nursing home today.  Treated for following conditions.    Assessment & plan of care:   Shock, concern for sepsis. Presented with WBC count 22 and hypotension.  This could be combination of hypovolemia. Urinalysis was without obvious evidence of infection. Blood cultures, negative CT scan chest abdomen pelvis, bronchiolitis, possible aspiration.  Significant history of esophageal dysphagia. Treating as aspiration pneumonia.  Completing 5 days of Rocephin  today.  Blood pressures are adequate.  Seen by speech therapy.  On modification diet.  She needs to take dysphagia and aspiration precautions all the time. WBC count is 17, chronically elevated.  No evidence of ongoing infection.  Recheck in 1 week.   Chronic respiratory failure with hypoxemia On 3 L oxygen at home.  Now stable.  On Brovana ,  Yupelri , DuoNebs,   Anemia of acute blood loss, hematoma right upper extremity: Elevate affected part.  Compressive dressing.  Received 3 PRBCs.  Hemoglobin is stable.  Blood loss mostly from rectal bleeding from prolapsed rectum. Hemoglobin has remained stable.   Right lower extremity edema: Asymmetrical swelling of the extremities. Duplex is negative for DVT. 2D echocardiogram with echocardiogram findings 60 to 65% ejection fraction, comparable to previous.  Grade 1 diastolic dysfunction. Ecchymosis and swelling right arm.   Hypothyroidism: On Synthroid .  Continue.   Peripheral vascular disease status post stents: Back on aspirin  and Brilinta .  Chronically ill.  Currently stable enough to discharge back to nursing home to continue supportive care.  Discharge Diagnoses:  Principal Problem:   Septic shock [facility])    Discharge Instructions  Discharge Instructions     Diet general   Complete by: As directed    Dysphagia 3 diet, thin liquids , aspiration precautions   Increase activity slowly   Complete by: As directed    No wound care   Complete by: As directed       Allergies as of 01/04/2024       Reactions   Penicillins Other (See Comments)   Tolerated zosyn  12/31/2023        Medication List     STOP taking these medications    Entresto  24-26 MG Generic drug: sacubitril -valsartan    guaiFENesin  100 MG/5ML liquid Commonly known as: ROBITUSSIN   levofloxacin  500 MG tablet Commonly known as: LEVAQUIN    sennosides-docusate sodium  8.6-50 MG tablet Commonly known as: SENOKOT-S   sucralfate  1 g tablet Commonly known as: CARAFATE        TAKE these medications    acetaminophen  500 MG tablet Commonly known  as: TYLENOL  Take 1,000 mg by mouth 3 (three) times daily.   ascorbic acid  500 MG tablet Commonly known as: VITAMIN C Take 500 mg by mouth daily.   aspirin  EC 81 MG tablet Take 81 mg by mouth daily.   atorvastatin  80 MG tablet Commonly known as:  LIPITOR  Take 80 mg by mouth at bedtime.   Breztri  Aerosphere 160-9-4.8 MCG/ACT Aero inhaler Generic drug: budesonide-glycopyrrolate-formoterol  Inhale 2 puffs into the lungs 2 (two) times daily.   Calcium  + Vitamin D3 600-5 MG-MCG Tabs Generic drug: Calcium  Carb-Cholecalciferol  Take 1 tablet by mouth daily at 12 noon.   Delsym 30 MG/5ML liquid Generic drug: dextromethorphan Take 60 mg by mouth every 12 (twelve) hours as needed for cough.   DULoxetine  60 MG capsule Commonly known as: CYMBALTA  Take 60 mg by mouth daily.   ferrous sulfate 220 (44 Fe) MG/5ML solution Take 308 mg by mouth daily.   Flonase Allergy Relief 50 MCG/ACT nasal spray Generic drug: fluticasone  Place 1 spray into both nostrils daily.   gabapentin  400 MG capsule Commonly known as: NEURONTIN  Take 400 mg by mouth 3 (three) times daily. Give with 100mg  capsules to equal 500mg  three times daily.   gabapentin  100 MG capsule Commonly known as: NEURONTIN  Take 100 mg by mouth 3 (three) times daily. Give with 400mg  capsules to equal 500mg  three times daily.   ipratropium-albuterol  0.5-2.5 (3) MG/3ML Soln Commonly known as: DUONEB Take 3 mLs by nebulization 3 (three) times daily.   levothyroxine  75 MCG tablet Commonly known as: SYNTHROID  Take 75 mcg by mouth daily before breakfast.   memantine  10 MG tablet Commonly known as: NAMENDA  Take 10 mg by mouth 2 (two) times daily.   Menthol (Topical Analgesic) 4 % Gel Apply 1 application  topically 3 (three) times daily. Biofreeze. Apply to left shoulder.   metoprolol  succinate 25 MG 24 hr tablet Commonly known as: TOPROL -XL Take 25 mg by mouth daily.   Miconazole Powd Apply 1 application  topically See admin instructions. Apply a thin dusting to groin area twice a day between meals as needed for rash.   NONFORMULARY OR COMPOUNDED ITEM Apply 1 application  topically See admin instructions. Diltiazem 2%/Lidocaine  2% compounded solution. Apply a thin layer to  rectal tissue twice daily for pain.   ondansetron  4 MG disintegrating tablet Commonly known as: ZOFRAN -ODT Take 4 mg by mouth 3 (three) times daily as needed for nausea or vomiting.   Oxycodone  HCl 10 MG Tabs Take 1 tablet (10 mg total) by mouth 4 (four) times daily for 5 days.   oxyCODONE  15 MG immediate release tablet Commonly known as: ROXICODONE  Take 1 tablet (15 mg total) by mouth See admin instructions for 5 days. Give 1 tablet (15mg ) by mouth daily as needed for moderate to severe pain between the hours of 0000-0400.   pantoprazole  40 MG tablet Commonly known as: PROTONIX  Take 40 mg by mouth 2 (two) times daily.   polyethylene glycol powder 17 GM/SCOOP powder Commonly known as: GLYCOLAX /MIRALAX  Take 17 g by mouth daily.   pramipexole  0.25 MG tablet Commonly known as: MIRAPEX  Take 0.25 mg by mouth 2 (two) times daily.   Preparation H 0.25-14-74.9 % rectal ointment Generic drug: phenylephrine -shark liver oil-mineral oil-petrolatum Place 1 Application rectally 4 (four) times daily.   promethazine 12.5 MG tablet Commonly known as: PHENERGAN Take 12.5 mg by mouth every 8 (eight) hours as needed for nausea.   sodium chloride  0.65 % Soln nasal spray Commonly known as: OCEAN Place 1 spray  into both nostrils 3 (three) times daily.   ticagrelor  90 MG Tabs tablet Commonly known as: BRILINTA  Take 90 mg by mouth 2 (two) times daily.   torsemide 10 MG tablet Commonly known as: DEMADEX Take 10 mg by mouth daily. Give with 20mg  tablet to equal 30mg  daily.   torsemide 20 MG tablet Commonly known as: DEMADEX Take 20 mg by mouth daily. Give with 10mg  tablet to equal 30mg  daily.        Allergies  Allergen Reactions   Penicillins Other (See Comments)    Tolerated zosyn  12/31/2023    Consultations: Critical care   Procedures/Studies: VAS US  LOWER EXTREMITY VENOUS (DVT) Result Date: 01/03/2024  Lower Venous DVT Study Patient Name:  Amy Mckay  Date of Exam:    01/03/2024 Medical Rec #: [id number]      Accession #:    [id number] Date of Birth: March 08, 1935      Patient Gender: F Patient Age:   45 years Exam Location:  [facility] Procedure:      VAS US  LOWER EXTREMITY VENOUS (DVT) Referring Phys: LEITA GASKINS --------------------------------------------------------------------------------  Indications: Edema.  Performing Technologist: Elmarie Lindau, RVT  Examination Guidelines: A complete evaluation includes B-mode imaging, spectral Doppler, color Doppler, and power Doppler as needed of all accessible portions of each vessel. Bilateral testing is considered an integral part of a complete examination. Limited examinations for reoccurring indications may be performed as noted. The reflux portion of the exam is performed with the patient in reverse Trendelenburg.  +---------+---------------+---------+-----------+----------+--------------+ RIGHT    CompressibilityPhasicitySpontaneityPropertiesThrombus Aging +---------+---------------+---------+-----------+----------+--------------+ CFV      Full           Yes      Yes                                 +---------+---------------+---------+-----------+----------+--------------+ SFJ      Full                                                        +---------+---------------+---------+-----------+----------+--------------+ FV Prox  Full                                                        +---------+---------------+---------+-----------+----------+--------------+ FV Mid   Full                                                        +---------+---------------+---------+-----------+----------+--------------+ FV DistalFull                                                        +---------+---------------+---------+-----------+----------+--------------+ PFV      Full                                                         +---------+---------------+---------+-----------+----------+--------------+  POP      Full           Yes      Yes                                 +---------+---------------+---------+-----------+----------+--------------+ PTV      Full                                                        +---------+---------------+---------+-----------+----------+--------------+ PERO     Full                                                        +---------+---------------+---------+-----------+----------+--------------+   +----+---------------+---------+-----------+----------+--------------+ LEFTCompressibilityPhasicitySpontaneityPropertiesThrombus Aging +----+---------------+---------+-----------+----------+--------------+ CFV Full           Yes      Yes                                 +----+---------------+---------+-----------+----------+--------------+     Summary: RIGHT: - There is no evidence of deep vein thrombosis in the lower extremity. However, portions of this examination were limited- see technologist comments above.  - No cystic structure found in the popliteal fossa.  LEFT: - No evidence of common femoral vein obstruction.   *See table(s) above for measurements and observations. Electronically signed by Lonni Gaskins MD on 01/03/2024 at 12:21:54 PM.    Final    DG Swallowing Func-Speech Pathology Result Date: 01/03/2024 Table formatting from the original result was not included. Images from the original result were not included. Modified Barium Swallow Study Patient Details Name: Amy Mckay MRN: [id number] Date of Birth: 1935-03-29 Today's Date: 01/03/2024 HPI/PMH: HPI: 89yo F PMHx COPD on 3L at baseline, chronic anemia due to rectal bleeding 2/2 prolapse presented after unwitnessed fall out of bed on Brillinta. Active bleeding from RUE but otherwise no traumatic injuries. Received 3U whole blood and remained hypotensive so was started on norepinephrine . Comes from rehab  facility, reportedly with progressive weakness, urinary frequency, and dysuria over the last few days. Pt with concern for septic shock with pulmonary source. Suspected to aspiration etiology per workup and pt dysphagia report. Pt demonstrates a moderate dysphagia secondary to prominent cricopharyngeus (criphayrngeal bar). Pt has poor dentition but reports she is going to get new dentures very soon. She is able to masticate a graham cracker with her few lower front dentition and does a thorough job. Though suspect there are textures that would be unmaneageable. Pharyngeal function with liquids and puree is adequate with decrease PES relaxation, but no significant residue. There is initiation of hyoid excursion with bolus in the closed vestibule and pyriforms, decreased epiglottic deflection, and very trace frank penetration of thin liquids without sensation. When pt was given a pill whole in puree it would not pass the CP bar despite multiple puree boluses. Pt then had some backflow to the oropharynx and also orally regurgitated the pill. Pt says this happens sometimes with water only, though not during exam. Would advise pt to be very selective about the textures of food she  attempts, but already has a great deal of awareness. Pt could f/u with GI/ENT if desired, though not sure that she would be a candidate for intervention. Pt also reports night time reflux and other issues that could increase risk of aspiration. Pt can continue a dys 3 Mech soft diet if she tolerates it, but may ultimately need purees. Factors that may increase risk of adverse event in presence of aspiration Noe & Lianne 2021): Factors that may increase risk of adverse event in presence of aspiration Noe & Lianne 2021): Frail or deconditioned Recommendations/Plan: Swallowing Evaluation Recommendations Swallowing Evaluation Recommendations Liquid Administration via: Cup; Straw Medication Administration: Whole meds with puree Postural  changes: Position pt fully upright for meals; Stay upright 30-60 min after meals Oral care recommendations: Oral care BID (2x/day) Recommended consults: Consider esophageal assessment Treatment Plan Treatment Plan Treatment recommendations: Therapy as outlined in treatment plan below Follow-up recommendations: Follow physicians's recommendations for discharge plan and follow up therapies Treatment frequency: Min 2x/week Treatment duration: 1 week Interventions: Diet toleration management by SLP Recommendations Recommendations for follow up therapy are one component of a multi-disciplinary discharge planning process, led by the attending physician.  Recommendations may be updated based on patient status, additional functional criteria and insurance authorization. Assessment: Orofacial Exam: Orofacial Exam Oral Cavity - Dentition: Missing dentition Anatomy: No data recorded Boluses Administered: Boluses Administered Boluses Administered: Thin liquids (Level 0); Mildly thick liquids (Level 2, nectar thick); Puree; Solid  Oral Impairment Domain: Oral Impairment Domain Lip Closure: No labial escape Tongue control during bolus hold: Posterior escape of less than half of bolus Bolus preparation/mastication: Slow prolonged chewing/mashing with complete recollection Bolus transport/lingual motion: Brisk tongue motion Oral residue: Complete oral clearance Initiation of pharyngeal swallow : Pyriform sinuses  Pharyngeal Impairment Domain: Pharyngeal Impairment Domain Soft palate elevation: No bolus between soft palate (SP)/pharyngeal wall (PW) Laryngeal elevation: Complete superior movement of thyroid cartilage with complete approximation of arytenoids to epiglottic petiole Anterior hyoid excursion: Complete anterior movement Epiglottic movement: Partial inversion Laryngeal vestibule closure: Incomplete, narrow column air/contrast in laryngeal vestibule Pharyngeal stripping wave : Present - complete Pharyngoesophageal segment  opening: Partial distention/partial duration, partial obstruction of flow Tongue base retraction: No contrast between tongue base and posterior pharyngeal wall (PPW) Pharyngeal residue: Trace residue within or on pharyngeal structures Location of pharyngeal residue: Valleculae  Esophageal Impairment Domain: No data recorded Pill: Pill Consistency administered: Puree Puree: Impaired (see clinical impressions) Penetration/Aspiration Scale Score: Penetration/Aspiration Scale Score 1.  Material does not enter airway: Mildly thick liquids (Level 2, nectar thick); Puree; Solid; Pill 5.  Material enters airway, CONTACTS cords and not ejected out: Thin liquids (Level 0) Compensatory Strategies: No data recorded  General Information: Caregiver present: No  Diet Prior to this Study: Thin liquids (Level 0); Dysphagia 3 (mechanical soft)   No data recorded  No data recorded  No data recorded  No data recorded Behavior/Cognition: Alert; Cooperative; Pleasant mood Self-Feeding Abilities: Able to self-feed No data recorded No data recorded Volitional Swallow: Able to elicit Exam Limitations: No limitations Goal Planning: Prognosis for improved oropharyngeal function: Good No data recorded No data recorded Patient/Family Stated Goal: none stated No data recorded Pain: No data recorded End of Session: Start Time:SLP Start Time (ACUTE ONLY): 9072 Stop Time: SLP Stop Time (ACUTE ONLY): 0944 Time Calculation:SLP Time Calculation (min) (ACUTE ONLY): 17 min Charges: SLP Evaluations $ SLP Speech Visit: 1 Visit SLP Evaluations $BSS Swallow: 1 Procedure $MBS Swallow: 1 Procedure SLP visit diagnosis: SLP Visit Diagnosis: Dysphagia, pharyngoesophageal  phase (R13.14) Past Medical History: Past Medical History: Diagnosis Date  COPD (chronic obstructive pulmonary disease) (HCC)   GERD (gastroesophageal reflux disease)   Hypertension  Past Surgical History: Past Surgical History: Procedure Laterality Date  COLONOSCOPY     ESOPHAGOGASTRODUODENOSCOPY   Consuelo [place], MA CCC-SLP [facility] Secure Chat Preferred Office [phone] [place] 01/03/2024, 10:21 AM  ECHOCARDIOGRAM COMPLETE Result Date: 01/01/2024    ECHOCARDIOGRAM REPORT   Patient Name:   Amy Mckay Date of Exam: 01/01/2024 Medical Rec #:  [id number]     Height:       61.0 in Accession #:    [id number]    Weight:       159.4 lb Date of Birth:  [birth year]     BSA:          1.715 m Patient Age:    89 years      BP:           135/98 mmHg Patient Gender: F             HR:           102 bpm. Exam Location:  Inpatient Procedure: 2D Echo, Color Doppler and Cardiac Doppler (Both Spectral and Color            Flow Doppler were utilized during procedure). Indications:    Shock  History:        Patient has no prior history of Echocardiogram examinations.                 COPD; Risk Factors:Hypertension.  Sonographer:    Jayson Gaskins Referring Phys: [id number] MATTHEW R HUNSUCKER IMPRESSIONS  1. Left ventricular ejection fraction, by estimation, is 60 to 65%. The left ventricle has normal function. The left ventricle has no regional wall motion abnormalities. Left ventricular diastolic parameters are consistent with Grade I diastolic dysfunction (impaired relaxation).  2. Right ventricular systolic function is normal. The right ventricular size is normal.  3. The mitral valve is normal in structure. No evidence of mitral valve regurgitation. No evidence of mitral stenosis. Severe mitral annular calcification.  4. The aortic valve is normal in structure. Aortic valve regurgitation is not visualized. No aortic stenosis is present.  5. The inferior vena cava is normal in size with greater than 50% respiratory variability, suggesting right atrial pressure of 3 mmHg. FINDINGS  Left Ventricle: Left ventricular ejection fraction, by estimation, is 60 to 65%. The left ventricle has normal function. The left ventricle has no regional wall motion abnormalities.  The left ventricular internal cavity size was normal in size. There is  no left ventricular hypertrophy. Left ventricular diastolic parameters are consistent with Grade I diastolic dysfunction (impaired relaxation). Right Ventricle: The right ventricular size is normal. No increase in right ventricular wall thickness. Right ventricular systolic function is normal. Left Atrium: Left atrial size was normal in size. Right Atrium: Right atrial size was normal in size. Pericardium: There is no evidence of pericardial effusion. Mitral Valve: The mitral valve is normal in structure. Severe mitral annular calcification. No evidence of mitral valve regurgitation. No evidence of mitral valve stenosis. Tricuspid Valve: The tricuspid valve is normal in structure. Tricuspid valve regurgitation is not demonstrated. No evidence of tricuspid stenosis. Aortic Valve: The aortic valve is normal in structure. Aortic valve regurgitation is not visualized. No aortic stenosis is present. Aortic valve mean gradient measures 2.0 mmHg. Aortic valve peak gradient measures 3.5 mmHg. Aortic valve area, by VTI measures 2.82 cm. Pulmonic  Valve: The pulmonic valve was normal in structure. Pulmonic valve regurgitation is not visualized. No evidence of pulmonic stenosis. Aorta: The aortic root is normal in size and structure. Venous: The inferior vena cava is normal in size with greater than 50% respiratory variability, suggesting right atrial pressure of 3 mmHg. IAS/Shunts: No atrial level shunt detected by color flow Doppler.  LEFT VENTRICLE PLAX 2D LVOT diam:     1.80 cm   Diastology LV SV:         48        LV e' medial:    6.20 cm/s LV SV Index:   28        LV E/e' medial:  10.4 LVOT Area:     2.54 cm  LV e' lateral:   6.96 cm/s                          LV E/e' lateral: 9.3  RIGHT VENTRICLE RV S prime:     17.50 cm/s TAPSE (M-mode): 2.0 cm LEFT ATRIUM             Index        RIGHT ATRIUM           Index LA Vol (A2C):   28.0 ml 16.33 ml/m   RA Area:     11.70 cm LA Vol (A4C):   32.0 ml 18.66 ml/m  RA Volume:   24.40 ml  14.23 ml/m LA Biplane Vol: 31.5 ml 18.37 ml/m  AORTIC VALVE AV Area (Vmax):    2.33 cm AV Area (Vmean):   2.16 cm AV Area (VTI):     2.82 cm AV Vmax:           93.20 cm/s AV Vmean:          72.000 cm/s AV VTI:            0.169 m AV Peak Grad:      3.5 mmHg AV Mean Grad:      2.0 mmHg LVOT Vmax:         85.40 cm/s LVOT Vmean:        61.200 cm/s LVOT VTI:          0.187 m LVOT/AV VTI ratio: 1.11  AORTA Ao Root diam: 3.10 cm MITRAL VALVE MV Area (PHT): 4.06 cm     SHUNTS MV Decel Time: 187 msec     Systemic VTI:  0.19 m MV E velocity: 64.50 cm/s   Systemic Diam: 1.80 cm MV A velocity: 144.00 cm/s MV E/A ratio:  0.45 Oneil Parchment MD Electronically signed by Oneil Parchment MD Signature Date/Time: 01/01/2024/5:03:24 PM    Final    DG Humerus Right Result Date: 12/31/2023 CLINICAL DATA:  Right arm pain after fall. EXAM: RIGHT HUMERUS - 2+ VIEW COMPARISON:  None Available. FINDINGS: There is no evidence of fracture or other focal bone lesions. Soft tissues are unremarkable. IMPRESSION: Negative. Electronically Signed   By: Lynwood Landy Raddle M.D.   On: 12/31/2023 10:47   DG Hand Complete Right Result Date: 12/31/2023 CLINICAL DATA:  Right hand pain after fall. EXAM: RIGHT HAND - COMPLETE 3+ VIEW COMPARISON:  None Available. FINDINGS: There is no evidence of fracture or dislocation. There is no evidence of arthropathy or other focal bone abnormality. Soft tissues are unremarkable. IMPRESSION: Negative. Electronically Signed   By: Lynwood Landy Raddle M.D.   On: 12/31/2023 10:46   CT CHEST ABDOMEN PELVIS W CONTRAST Result  Date: 12/31/2023 CLINICAL DATA:  Level 1 trauma. Polytrauma, blunt EXAM: CT CHEST, ABDOMEN, AND PELVIS WITH CONTRAST TECHNIQUE: Multidetector CT imaging of the chest, abdomen and pelvis was performed following the standard protocol during bolus administration of intravenous contrast. RADIATION DOSE REDUCTION: This exam was  performed according to the departmental dose-optimization program which includes automated exposure control, adjustment of the mA and/or kV according to patient size and/or use of iterative reconstruction technique. CONTRAST:  75mL OMNIPAQUE  IOHEXOL  350 MG/ML SOLN COMPARISON:  09/07/2023 chest CT and 06/15/2023 CT angiogram of the abdomen and pelvis. FINDINGS: CT CHEST FINDINGS Cardiovascular: Normal heart size. No significant pericardial fluid/thickening. Three vessel coronary atherosclerosis. Atherosclerotic nonaneurysmal thoracic aorta. Normal caliber pulmonary arteries. No evidence of acute thoracic aortic injury. No central pulmonary emboli. Mediastinum/Nodes: No pneumomediastinum. No mediastinal hematoma. No discrete thyroid nodules. Unremarkable esophagus. No axillary, mediastinal or hilar lymphadenopathy. Lungs/Pleura: No pneumothorax. No pleural effusion. Motion degraded images limiting assessment. Mild-to-moderate patchy tree-in-bud opacity and indistinct nodular foci of consolidation throughout the posterior lungs bilaterally, new from 09/07/2023 chest CT, largest 1.6 x 1.5 cm in the posterior right upper lobe on series 5/image 57 and 1.0 cm in the medial left upper lobe on series 5/image 55. Anterior right lower lobe solid 0.4 cm nodule on series 5/image 102 is stable. Musculoskeletal: No aggressive appearing focal osseous lesions. No fracture detected in the chest. Mild thoracic spondylosis. CT ABDOMEN PELVIS FINDINGS Hepatobiliary: Normal liver with no liver laceration or mass. Normal gallbladder with no radiopaque cholelithiasis. No biliary ductal dilatation. Pancreas: Chronic diffuse pancreatic duct dilation up to 5 mm diameter, unchanged. Chronic hypodense 1.0 cm uncinate process pancreatic lesion on series 4/image 81 is unchanged from 06/15/2023 CT angiogram abdomen and pelvis study. No new pancreatic lesions. Spleen: Normal size. No laceration or mass. Adrenals/Urinary Tract: Normal adrenals. No  hydronephrosis. No renal laceration. No renal mass. Normal bladder. Stomach/Bowel: Small hiatal hernia. Otherwise normal nondistended stomach. Normal caliber small bowel with no small bowel wall thickening. Appendix not discretely visualized. Moderate left colonic diverticulosis with no large bowel wall thickening or acute pericolonic fat stranding. Vascular/Lymphatic: Atherosclerotic nonaneurysmal abdominal aorta. Patent portal, splenic, hepatic and renal veins. Right common femoral central venous catheter terminates in the right common iliac vein. No pathologically enlarged lymph nodes in the abdomen or pelvis. Reproductive: Status post hysterectomy, with no abnormal findings at the vaginal cuff. No adnexal mass. Other: No pneumoperitoneum, ascites or focal fluid collection. Musculoskeletal: No aggressive appearing focal osseous lesions. No fracture in the abdomen or pelvis. Moderate lumbar degenerative disc disease. IMPRESSION: 1. No evidence of acute traumatic injury in the chest, abdomen or pelvis. 2. Mild-to-moderate patchy tree-in-bud opacity and indistinct nodular foci of consolidation throughout the posterior lungs bilaterally, new from 09/07/2023 chest CT, largest 1.6 cm in the posterior right upper lobe. Findings are most compatible with an infectious or inflammatory bronchiolitis, favor aspiration. Suggest follow-up noncontrast chest CT in 3 months to ensure resolution. 3. Three vessel coronary atherosclerosis. 4. Small hiatal hernia. 5. Moderate left colonic diverticulosis. 6. Chronic diffuse pancreatic duct dilation up to 5 mm diameter, unchanged. Chronic hypodense 1.0 cm uncinate process pancreatic lesion is unchanged from 06/15/2023 CT angiogram abdomen and pelvis study. No new pancreatic lesions. Suggest follow-up noncontrast abdominal MRI in 1 year. 7.  Aortic Atherosclerosis (ICD10-I70.0). These results were discussed in person at the time of interpretation on 12/31/2023 at 10:40 am to provider  Ozell Shaper PA, who verbally acknowledged these results. Electronically Signed   By: Selinda DELENA Leonce CHRISTELLA.D.  On: 12/31/2023 10:40   CT HEAD WO CONTRAST Addendum Date: 12/31/2023 ADDENDUM REPORT: 12/31/2023 10:23 ADDENDUM: #1 discussed by telephone with Ozell Shaper PA on 12/31/2023 at 10:20 . Electronically Signed   By: VEAR Hurst M.D.   On: 12/31/2023 10:23   Result Date: 12/31/2023 CLINICAL DATA:  89 year old female level 1 trauma. Unwitnessed fall. On blood thinners. EXAM: CT HEAD WITHOUT CONTRAST TECHNIQUE: Contiguous axial images were obtained from the base of the skull through the vertex without intravenous contrast. RADIATION DOSE REDUCTION: This exam was performed according to the departmental dose-optimization program which includes automated exposure control, adjustment of the mA and/or kV according to patient size and/or use of iterative reconstruction technique. COMPARISON:  Head CT 11/03/2022. FINDINGS: Brain: Cerebral volume remains normal for age. Chronic extra-axial CSF over the frontal convexities. No midline shift, ventriculomegaly, mass effect, evidence of mass lesion, intracranial hemorrhage or evidence of cortically based acute infarction. Mild for age patchy cerebral white matter hypodensity. Chronic lacunar infarct medial right thalamus. Otherwise preserved gray-white differentiation. Vascular: Calcified atherosclerosis at the skull base. No suspicious intracranial vascular hyperdensity. Skull: No acute osseous abnormality identified. Sinuses/Orbits: No layering sinus fluid or hemorrhage identified. Tympanic cavities and mastoids well aerated. Mild new mucosal thickening left sphenoid sinus. Other: No discrete orbit or scalp soft tissue injury identified. IMPRESSION: 1. No acute intracranial abnormality or acute traumatic injury identified. 2. Chronic small vessel disease. Electronically Signed: By: VEAR Hurst M.D. On: 12/31/2023 10:18   CT CERVICAL SPINE WO CONTRAST Result Date:  12/31/2023 CLINICAL DATA:  89 year old female level 1 trauma. Unwitnessed fall. On blood thinners. EXAM: CT CERVICAL SPINE WITHOUT CONTRAST TECHNIQUE: Multidetector CT imaging of the cervical spine was performed without intravenous contrast. Multiplanar CT image reconstructions were also generated. RADIATION DOSE REDUCTION: This exam was performed according to the departmental dose-optimization program which includes automated exposure control, adjustment of the mA and/or kV according to patient size and/or use of iterative reconstruction technique. COMPARISON:  Head CT today.  Cervical spine CT 06/14/2016. FINDINGS: Alignment: Mild motion artifact today but improved cervical lordosis compared to 2017. Chronic degenerative appearing anterolisthesis of C4 on C5 and C7 on T1. Bilateral posterior element alignment appears stable. Skull base and vertebrae: Bone mineralization is within normal limits for age. Visualized skull base is intact. No atlanto-occipital dissociation. C1 and C2 appear intact and aligned. Intermittent motion artifact below C1. No acute osseous abnormality identified. Soft tissues and spinal canal: No prevertebral fluid or swelling. No visible canal hematoma. Bulky calcified cervical carotid atherosclerosis in the bilateral neck. Disc levels: Stable since 2017. Upper chest: Chest CT reported separately today. Visible upper thoracic levels appear intact. IMPRESSION: 1. No acute traumatic injury identified in the cervical spine. 2. Chronic cervical spine degeneration. 3. Bulky calcified cervical carotid atherosclerosis. #1 discussed by telephone with Michael Maczis PA on 12/31/2023 at 10:20 . Electronically Signed   By: VEAR Hurst M.D.   On: 12/31/2023 10:22   DG Pelvis Portable Result Date: 12/31/2023 CLINICAL DATA:  Fall. EXAM: PORTABLE PELVIS 1-2 VIEWS COMPARISON:  None Available. FINDINGS: There is no evidence of pelvic fracture or diastasis. No pelvic bone lesions are seen. IMPRESSION: No acute  abnormality seen. Electronically Signed   By: Lynwood Landy Raddle M.D.   On: 12/31/2023 09:55   DG Chest Port 1 View Result Date: 12/31/2023 CLINICAL DATA:  Fall. EXAM: PORTABLE CHEST 1 VIEW COMPARISON:  June 12, 2023. FINDINGS: The heart size and mediastinal contours are within normal limits. Both lungs are clear.  The visualized skeletal structures are unremarkable. IMPRESSION: No active disease. Electronically Signed   By: Lynwood Landy Raddle M.D.   On: 12/31/2023 09:54   (Echo, Carotid, EGD, Colonoscopy, ERCP)    Subjective: Patient seen and examined.  She has some leg pain, rectal pain.  She has not noticed any prolapsed rectum or bleeding after coming to the hospital last 5 days.  Pleasantly talks about her childhood, aliens, Bigfoot etc. Patient has dentist appointment tomorrow and she wants to make it.  Need to be discharged to go to dentist.  Kandis to get out of the hospital. Patient is on long-term opiate regimen and wants to make sure it is scheduled.   Discharge Exam: Vitals:   01/04/24 0445 01/04/24 0824  BP: (!) 138/105 (!) 134/104  Pulse: 65 (!) 101  Resp: 17 17  Temp: (!) 97.5 F (36.4 C) 97.7 F (36.5 C)  SpO2: 96% 98%   Vitals:   01/03/24 2020 01/04/24 0445 01/04/24 0446 01/04/24 0824  BP: (!) 117/91 (!) 138/105  (!) 134/104  Pulse:  65  (!) 101  Resp: 17 17  17   Temp: 98.9 F (37.2 C) (!) 97.5 F (36.4 C)  97.7 F (36.5 C)  TempSrc: Oral Oral    SpO2: 97% 96%  98%  Weight:   73.4 kg   Height:        General: Pt is alert, awake, not in acute distress Frail looking female but not in any distress.  She is age-appropriate. Cardiovascular: RRR, S1/S2 +, no rubs, no gallops Respiratory: CTA bilaterally, no wheezing, no rhonchi, she is on 3 L oxygen. Abdominal: Soft, NT, ND, bowel sounds + Extremities: no edema, no cyanosis     The results of significant diagnostics from this hospitalization (including imaging, microbiology, ancillary and laboratory) are listed  below for reference.     Microbiology: Recent Results (from the past 240 hours)  MRSA Next Gen by PCR, Nasal     Status: None   Collection Time: 12/31/23 12:52 PM   Specimen: Nasal Mucosa; Nasal Swab  Result Value Ref Range Status   MRSA by PCR Next Gen NOT DETECTED NOT DETECTED Final    Comment: (NOTE) The GeneXpert MRSA Assay (FDA approved for NASAL specimens only), is one component of a comprehensive MRSA colonization surveillance program. It is not intended to diagnose MRSA infection nor to guide or monitor treatment for MRSA infections. Test performance is not FDA approved in patients less than 82 years old. Performed at [facility], [address] [place], [place] [address]   Culture, blood (Routine X 2) w Reflex to ID Panel     Status: None (Preliminary result)   Collection Time: 12/31/23  1:27 PM   Specimen: BLOOD LEFT ARM  Result Value Ref Range Status   Specimen Description BLOOD LEFT ARM  Final   Special Requests   Final    BOTTLES DRAWN AEROBIC ONLY Blood Culture results may not be optimal due to an inadequate volume of blood received in culture bottles   Culture   Final    NO GROWTH 4 DAYS Performed at [facility], [address] [place], [place] [address]    Report Status PENDING  Incomplete  Culture, blood (Routine X 2) w Reflex to ID Panel     Status: None (Preliminary result)   Collection Time: 12/31/23  2:51 PM   Specimen: BLOOD RIGHT HAND  Result Value Ref Range Status   Specimen Description BLOOD RIGHT  HAND  Final   Special Requests   Final    BOTTLES DRAWN AEROBIC AND ANAEROBIC Blood Culture results may not be optimal due to an inadequate volume of blood received in culture bottles   Culture   Final    NO GROWTH 4 DAYS Performed at [facility], [address], [place], [place] [address]    Report Status PENDING  Incomplete     Labs: BNP (last 3 results) No results for input(s): BNP in the last 8760 hours. Basic Metabolic  Panel: Recent Labs  Lab 12/31/23 1040 12/31/23 1103 01/01/24 0426 01/02/24 0752 01/03/24 0710 01/04/24 0610  NA 137 138 138 136 137 135  K 3.0* 2.9* 4.0 4.0 3.7 3.8  CL 96* 95* 96* 94* 95* 96*  CO2 25  --  31 26 28 25   GLUCOSE 145* 145* 131* 96 103* 114*  BUN 26* 27* 26* 28* 18 17  CREATININE 1.11* 1.10* 1.15* 0.99 0.91 0.88  CALCIUM  8.2*  --  9.4 9.4 9.5 9.4   Liver Function Tests: Recent Labs  Lab 12/31/23 1040  AST 36  ALT 23  ALKPHOS 63  BILITOT 0.7  PROT 6.5  ALBUMIN 2.8*   No results for input(s): LIPASE, AMYLASE in the last 168 hours. No results for input(s): AMMONIA in the last 168 hours. CBC: Recent Labs  Lab 01/01/24 0426 01/01/24 1633 01/02/24 0752 01/02/24 1704 01/04/24 0610  WBC 17.3* 18.4* 16.7* 16.3* 17.8*  NEUTROABS  --   --   --   --  13.4*  HGB 13.6 13.8 14.4 14.7 14.9  HCT 40.4 41.1 44.8 45.2 46.0  MCV 85.6 86.7 86.8 87.1 88.0  PLT 336 345 333 336 322   Cardiac Enzymes: Recent Labs  Lab 12/31/23 1040  CKTOTAL 139   BNP: Invalid input(s): POCBNP CBG: Recent Labs  Lab 01/03/24 0806 01/03/24 1155 01/03/24 1644 01/03/24 2019 01/04/24 0825  GLUCAP 104* 116* 112* 159* 108*   D-Dimer No results for input(s): DDIMER in the last 72 hours. Hgb A1c No results for input(s): HGBA1C in the last 72 hours. Lipid Profile No results for input(s): CHOL, HDL, LDLCALC, TRIG, CHOLHDL, LDLDIRECT in the last 72 hours. Thyroid function studies No results for input(s): TSH, T4TOTAL, T3FREE, THYROIDAB in the last 72 hours.  Invalid input(s): FREET3 Anemia work up No results for input(s): VITAMINB12, FOLATE, FERRITIN, TIBC, IRON, RETICCTPCT in the last 72 hours. Urinalysis    Component Value Date/Time   COLORURINE STRAW (A) 12/31/2023 1059   APPEARANCEUR CLEAR 12/31/2023 1059   LABSPEC 1.010 12/31/2023 1059   PHURINE 6.0 12/31/2023 1059   GLUCOSEU NEGATIVE 12/31/2023 1059   HGBUR LARGE (A)  12/31/2023 1059   BILIRUBINUR NEGATIVE 12/31/2023 1059   KETONESUR NEGATIVE 12/31/2023 1059   PROTEINUR NEGATIVE 12/31/2023 1059   NITRITE NEGATIVE 12/31/2023 1059   LEUKOCYTESUR NEGATIVE 12/31/2023 1059   Sepsis Labs Recent Labs  Lab 01/01/24 1633 01/02/24 0752 01/02/24 1704 01/04/24 0610  WBC 18.4* 16.7* 16.3* 17.8*   Microbiology Recent Results (from the past 240 hours)  MRSA Next Gen by PCR, Nasal     Status: None   Collection Time: 12/31/23 12:52 PM   Specimen: Nasal Mucosa; Nasal Swab  Result Value Ref Range Status   MRSA by PCR Next Gen NOT DETECTED NOT DETECTED Final    Comment: (NOTE) The GeneXpert MRSA Assay (FDA approved for NASAL specimens only), is one component of a comprehensive MRSA colonization surveillance program. It is not intended to diagnose MRSA  infection nor to guide or monitor treatment for MRSA infections. Test performance is not FDA approved in patients less than 22 years old. Performed at [facility], [address] [place], [place] [address]   Culture, blood (Routine X 2) w Reflex to ID Panel     Status: None (Preliminary result)   Collection Time: 12/31/23  1:27 PM   Specimen: BLOOD LEFT ARM  Result Value Ref Range Status   Specimen Description BLOOD LEFT ARM  Final   Special Requests   Final    BOTTLES DRAWN AEROBIC ONLY Blood Culture results may not be optimal due to an inadequate volume of blood received in culture bottles   Culture   Final    NO GROWTH 4 DAYS Performed at [facility], [address] [place], [place] [address]    Report Status PENDING  Incomplete  Culture, blood (Routine X 2) w Reflex to ID Panel     Status: None (Preliminary result)   Collection Time: 12/31/23  2:51 PM   Specimen: BLOOD RIGHT HAND  Result Value Ref Range Status   Specimen Description BLOOD RIGHT HAND  Final   Special Requests   Final    BOTTLES DRAWN AEROBIC AND ANAEROBIC Blood Culture results may not be optimal due to an inadequate  volume of blood received in culture bottles   Culture   Final    NO GROWTH 4 DAYS Performed at [facility], [address], [place], [place] [address]    Report Status PENDING  Incomplete     Time coordinating discharge: 40 minutes  SIGNED:   Renato Applebaum, MD  [facility] 01/04/2024, 10:01 AM

## 2024-01-05 LAB — CULTURE, BLOOD (ROUTINE X 2)
Culture: NO GROWTH
Culture: NO GROWTH

## 2024-06-01 ENCOUNTER — Emergency Department (HOSPITAL_COMMUNITY)

## 2024-06-01 ENCOUNTER — Other Ambulatory Visit: Payer: Self-pay

## 2024-06-01 ENCOUNTER — Inpatient Hospital Stay (HOSPITAL_COMMUNITY)
Admission: EM | Admit: 2024-06-01 | Discharge: 2024-06-05 | DRG: 377 | Disposition: A | Discharge status: Skilled Nursing Facility | Source: Skilled Nursing Facility | Attending: Internal Medicine | Admitting: Internal Medicine

## 2024-06-01 DIAGNOSIS — G8929 Other chronic pain: Secondary | ICD-10-CM | POA: Diagnosis not present

## 2024-06-01 DIAGNOSIS — E874 Mixed disorder of acid-base balance: Secondary | ICD-10-CM | POA: Diagnosis present

## 2024-06-01 DIAGNOSIS — E66811 Obesity, class 1: Secondary | ICD-10-CM | POA: Diagnosis present

## 2024-06-01 DIAGNOSIS — Z6830 Body mass index (BMI) 30.0-30.9, adult: Secondary | ICD-10-CM

## 2024-06-01 DIAGNOSIS — D509 Iron deficiency anemia, unspecified: Secondary | ICD-10-CM | POA: Diagnosis present

## 2024-06-01 DIAGNOSIS — R4189 Other symptoms and signs involving cognitive functions and awareness: Secondary | ICD-10-CM | POA: Diagnosis present

## 2024-06-01 DIAGNOSIS — D62 Acute posthemorrhagic anemia: Secondary | ICD-10-CM | POA: Diagnosis present

## 2024-06-01 DIAGNOSIS — I2510 Atherosclerotic heart disease of native coronary artery without angina pectoris: Secondary | ICD-10-CM | POA: Diagnosis not present

## 2024-06-01 DIAGNOSIS — E039 Hypothyroidism, unspecified: Secondary | ICD-10-CM | POA: Diagnosis present

## 2024-06-01 DIAGNOSIS — E878 Other disorders of electrolyte and fluid balance, not elsewhere classified: Secondary | ICD-10-CM | POA: Diagnosis present

## 2024-06-01 DIAGNOSIS — F0390 Unspecified dementia without behavioral disturbance: Secondary | ICD-10-CM | POA: Diagnosis present

## 2024-06-01 DIAGNOSIS — E872 Acidosis, unspecified: Secondary | ICD-10-CM | POA: Diagnosis not present

## 2024-06-01 DIAGNOSIS — K922 Gastrointestinal hemorrhage, unspecified: Secondary | ICD-10-CM | POA: Diagnosis not present

## 2024-06-01 DIAGNOSIS — G2581 Restless legs syndrome: Secondary | ICD-10-CM | POA: Diagnosis present

## 2024-06-01 DIAGNOSIS — J441 Chronic obstructive pulmonary disease with (acute) exacerbation: Secondary | ICD-10-CM

## 2024-06-01 DIAGNOSIS — J449 Chronic obstructive pulmonary disease, unspecified: Secondary | ICD-10-CM | POA: Diagnosis not present

## 2024-06-01 DIAGNOSIS — I5022 Chronic systolic (congestive) heart failure: Secondary | ICD-10-CM | POA: Diagnosis present

## 2024-06-01 DIAGNOSIS — K5731 Diverticulosis of large intestine without perforation or abscess with bleeding: Secondary | ICD-10-CM | POA: Diagnosis present

## 2024-06-01 DIAGNOSIS — R578 Other shock: Secondary | ICD-10-CM | POA: Diagnosis present

## 2024-06-01 DIAGNOSIS — D500 Iron deficiency anemia secondary to blood loss (chronic): Secondary | ICD-10-CM | POA: Diagnosis not present

## 2024-06-01 DIAGNOSIS — Z993 Dependence on wheelchair: Secondary | ICD-10-CM

## 2024-06-01 DIAGNOSIS — Z7902 Long term (current) use of antithrombotics/antiplatelets: Secondary | ICD-10-CM | POA: Diagnosis not present

## 2024-06-01 DIAGNOSIS — Z515 Encounter for palliative care: Secondary | ICD-10-CM | POA: Diagnosis not present

## 2024-06-01 DIAGNOSIS — K559 Vascular disorder of intestine, unspecified: Secondary | ICD-10-CM | POA: Diagnosis present

## 2024-06-01 DIAGNOSIS — K5791 Diverticulosis of intestine, part unspecified, without perforation or abscess with bleeding: Secondary | ICD-10-CM | POA: Diagnosis not present

## 2024-06-01 DIAGNOSIS — N179 Acute kidney failure, unspecified: Secondary | ICD-10-CM | POA: Diagnosis present

## 2024-06-01 DIAGNOSIS — Z634 Disappearance and death of family member: Secondary | ICD-10-CM

## 2024-06-01 DIAGNOSIS — Z1152 Encounter for screening for COVID-19: Secondary | ICD-10-CM

## 2024-06-01 DIAGNOSIS — I959 Hypotension, unspecified: Secondary | ICD-10-CM | POA: Diagnosis present

## 2024-06-01 DIAGNOSIS — Z8673 Personal history of transient ischemic attack (TIA), and cerebral infarction without residual deficits: Secondary | ICD-10-CM | POA: Diagnosis not present

## 2024-06-01 DIAGNOSIS — C189 Malignant neoplasm of colon, unspecified: Secondary | ICD-10-CM | POA: Diagnosis present

## 2024-06-01 DIAGNOSIS — Z66 Do not resuscitate: Secondary | ICD-10-CM | POA: Diagnosis present

## 2024-06-01 DIAGNOSIS — J9611 Chronic respiratory failure with hypoxia: Secondary | ICD-10-CM | POA: Diagnosis present

## 2024-06-01 DIAGNOSIS — Z79890 Hormone replacement therapy (postmenopausal): Secondary | ICD-10-CM

## 2024-06-01 DIAGNOSIS — G9341 Metabolic encephalopathy: Secondary | ICD-10-CM | POA: Diagnosis present

## 2024-06-01 DIAGNOSIS — F17210 Nicotine dependence, cigarettes, uncomplicated: Secondary | ICD-10-CM | POA: Diagnosis present

## 2024-06-01 DIAGNOSIS — Z880 Allergy status to penicillin: Secondary | ICD-10-CM

## 2024-06-01 DIAGNOSIS — Z79899 Other long term (current) drug therapy: Secondary | ICD-10-CM

## 2024-06-01 DIAGNOSIS — T380X5A Adverse effect of glucocorticoids and synthetic analogues, initial encounter: Secondary | ICD-10-CM | POA: Diagnosis present

## 2024-06-01 DIAGNOSIS — I110 Hypertensive heart disease with heart failure: Secondary | ICD-10-CM | POA: Diagnosis present

## 2024-06-01 DIAGNOSIS — D689 Coagulation defect, unspecified: Secondary | ICD-10-CM | POA: Diagnosis present

## 2024-06-01 DIAGNOSIS — K623 Rectal prolapse: Principal | ICD-10-CM | POA: Diagnosis present

## 2024-06-01 DIAGNOSIS — Z9981 Dependence on supplemental oxygen: Secondary | ICD-10-CM

## 2024-06-01 DIAGNOSIS — I252 Old myocardial infarction: Secondary | ICD-10-CM

## 2024-06-01 DIAGNOSIS — Z7982 Long term (current) use of aspirin: Secondary | ICD-10-CM

## 2024-06-01 DIAGNOSIS — G894 Chronic pain syndrome: Secondary | ICD-10-CM | POA: Diagnosis present

## 2024-06-01 DIAGNOSIS — D72829 Elevated white blood cell count, unspecified: Secondary | ICD-10-CM | POA: Diagnosis not present

## 2024-06-01 DIAGNOSIS — Z8719 Personal history of other diseases of the digestive system: Secondary | ICD-10-CM

## 2024-06-01 DIAGNOSIS — I739 Peripheral vascular disease, unspecified: Secondary | ICD-10-CM | POA: Diagnosis present

## 2024-06-01 DIAGNOSIS — I5020 Unspecified systolic (congestive) heart failure: Secondary | ICD-10-CM | POA: Diagnosis not present

## 2024-06-01 LAB — CBC WITH DIFFERENTIAL/PLATELET
Abs Immature Granulocytes: 0.08 K/uL — ABNORMAL HIGH (ref 0.00–0.07)
Basophils Absolute: 0 K/uL (ref 0.0–0.1)
Basophils Relative: 0 %
Eosinophils Absolute: 0.5 K/uL (ref 0.0–0.5)
Eosinophils Relative: 5 %
HCT: 27.2 % — ABNORMAL LOW (ref 36.0–46.0)
Hemoglobin: 7.9 g/dL — ABNORMAL LOW (ref 12.0–15.0)
Immature Granulocytes: 1 %
Lymphocytes Relative: 31 %
Lymphs Abs: 3.6 K/uL (ref 0.7–4.0)
MCH: 27 pg (ref 26.0–34.0)
MCHC: 29 g/dL — ABNORMAL LOW (ref 30.0–36.0)
MCV: 92.8 fL (ref 80.0–100.0)
Monocytes Absolute: 1.3 K/uL — ABNORMAL HIGH (ref 0.1–1.0)
Monocytes Relative: 12 %
Neutro Abs: 6 K/uL (ref 1.7–7.7)
Neutrophils Relative %: 51 %
Platelets: 330 K/uL (ref 150–400)
RBC: 2.93 MIL/uL — ABNORMAL LOW (ref 3.87–5.11)
RDW: 14.8 % (ref 11.5–15.5)
WBC: 11.6 K/uL — ABNORMAL HIGH (ref 4.0–10.5)
nRBC: 0.2 % (ref 0.0–0.2)

## 2024-06-01 LAB — CBC
HCT: 30.6 % — ABNORMAL LOW (ref 36.0–46.0)
Hemoglobin: 9.5 g/dL — ABNORMAL LOW (ref 12.0–15.0)
MCH: 28.5 pg (ref 26.0–34.0)
MCHC: 31 g/dL (ref 30.0–36.0)
MCV: 91.9 fL (ref 80.0–100.0)
Platelets: 317 K/uL (ref 150–400)
RBC: 3.33 MIL/uL — ABNORMAL LOW (ref 3.87–5.11)
RDW: 14.4 % (ref 11.5–15.5)
WBC: 12.7 K/uL — ABNORMAL HIGH (ref 4.0–10.5)
nRBC: 0.2 % (ref 0.0–0.2)

## 2024-06-01 LAB — COMPREHENSIVE METABOLIC PANEL WITH GFR
ALT: 13 U/L (ref 0–44)
AST: 26 U/L (ref 15–41)
Albumin: 4 g/dL (ref 3.5–5.0)
Alkaline Phosphatase: 109 U/L (ref 38–126)
Anion gap: 12 (ref 5–15)
BUN: 24 mg/dL — ABNORMAL HIGH (ref 8–23)
CO2: 34 mmol/L — ABNORMAL HIGH (ref 22–32)
Calcium: 9.3 mg/dL (ref 8.9–10.3)
Chloride: 96 mmol/L — ABNORMAL LOW (ref 98–111)
Creatinine, Ser: 1.09 mg/dL — ABNORMAL HIGH (ref 0.44–1.00)
GFR, Estimated: 49 mL/min — ABNORMAL LOW (ref >60)
Glucose, Bld: 130 mg/dL — ABNORMAL HIGH (ref 70–99)
Potassium: 4.1 mmol/L (ref 3.5–5.1)
Sodium: 141 mmol/L (ref 135–145)
Total Bilirubin: <0.2 mg/dL (ref 0.0–1.2)
Total Protein: 7.2 g/dL (ref 6.5–8.1)

## 2024-06-01 LAB — TECHNOLOGIST SMEAR REVIEW: Plt Morphology: NORMAL

## 2024-06-01 LAB — URINALYSIS, ROUTINE W REFLEX MICROSCOPIC
Bacteria, UA: NONE SEEN
Bilirubin Urine: NEGATIVE
Glucose, UA: NEGATIVE mg/dL
Ketones, ur: NEGATIVE mg/dL
Nitrite: NEGATIVE
Protein, ur: 30 mg/dL — AB
Specific Gravity, Urine: 1.006 (ref 1.005–1.030)
pH: 7 (ref 5.0–8.0)

## 2024-06-01 LAB — I-STAT CHEM 8, ED
BUN: 30 mg/dL — ABNORMAL HIGH (ref 8–23)
Calcium, Ion: 1.06 mmol/L — ABNORMAL LOW (ref 1.15–1.40)
Chloride: 94 mmol/L — ABNORMAL LOW (ref 98–111)
Creatinine, Ser: 1.2 mg/dL — ABNORMAL HIGH (ref 0.44–1.00)
Glucose, Bld: 153 mg/dL — ABNORMAL HIGH (ref 70–99)
HCT: 25 % — ABNORMAL LOW (ref 36.0–46.0)
Hemoglobin: 8.5 g/dL — ABNORMAL LOW (ref 12.0–15.0)
Potassium: 4.3 mmol/L (ref 3.5–5.1)
Sodium: 140 mmol/L (ref 135–145)
TCO2: 36 mmol/L — ABNORMAL HIGH (ref 22–32)

## 2024-06-01 LAB — RESP PANEL BY RT-PCR (RSV, FLU A&B, COVID)  RVPGX2
Influenza A by PCR: NEGATIVE
Influenza B by PCR: NEGATIVE
Resp Syncytial Virus by PCR: NEGATIVE
SARS Coronavirus 2 by RT PCR: NEGATIVE

## 2024-06-01 LAB — I-STAT CG4 LACTIC ACID, ED
Lactic Acid, Venous: 3.9 mmol/L (ref 0.5–1.9)
Lactic Acid, Venous: 3.6 mmol/L (ref 0.5–1.9)

## 2024-06-01 LAB — PROTIME-INR
INR: 0.9 (ref 0.8–1.2)
Prothrombin Time: 13.2 s (ref 11.4–15.2)

## 2024-06-01 LAB — LACTATE DEHYDROGENASE: LDH: 350 U/L — ABNORMAL HIGH (ref 105–235)

## 2024-06-01 LAB — APTT: aPTT: 28 s (ref 24–36)

## 2024-06-01 LAB — MAGNESIUM: Magnesium: 1.8 mg/dL (ref 1.7–2.4)

## 2024-06-01 LAB — PRO BRAIN NATRIURETIC PEPTIDE: Pro Brain Natriuretic Peptide: 217 pg/mL (ref <300.0)

## 2024-06-01 LAB — BILIRUBIN, TOTAL: Total Bilirubin: 0.5 mg/dL (ref 0.0–1.2)

## 2024-06-01 MED ORDER — SODIUM CHLORIDE 0.9% IV SOLUTION: Freq: Once | INTRAVENOUS | Status: AC

## 2024-06-01 MED ORDER — MORPHINE SULFATE (PF) 2 MG/ML IV SOLN
2.0000 mg | INTRAVENOUS | Status: DC | PRN
  Administered 2024-06-01: 2 mg via INTRAVENOUS
  Filled 2024-06-01 (×2): qty 1

## 2024-06-01 MED ORDER — NOREPINEPHRINE 4 MG/250ML-% IV SOLN
0.0000 ug/min | INTRAVENOUS | Status: DC
  Administered 2024-06-01: 18:00:00 2 ug/min via INTRAVENOUS

## 2024-06-01 MED ORDER — FENTANYL CITRATE (PF) 50 MCG/ML IJ SOSY
50.0000 ug | PREFILLED_SYRINGE | Freq: Once | INTRAMUSCULAR | Status: AC
  Administered 2024-06-01: 21:00:00 50 ug via INTRAVENOUS
  Filled 2024-06-01: qty 1

## 2024-06-01 MED ORDER — ONDANSETRON HCL 4 MG/2ML IJ SOLN
4.0000 mg | Freq: Four times a day (QID) | INTRAMUSCULAR | Status: DC | PRN
  Administered 2024-06-02: 4 mg via INTRAVENOUS
  Filled 2024-06-01: qty 2

## 2024-06-01 MED ORDER — NOREPINEPHRINE 4 MG/250ML-% IV SOLN: 0.0000 ug/min | INTRAVENOUS | Status: DC

## 2024-06-01 MED ORDER — INSULIN ASPART 100 UNIT/ML IJ SOLN
0.0000 [IU] | INTRAMUSCULAR | Status: DC
  Administered 2024-06-02 – 2024-06-03 (×4): 1 [IU] via SUBCUTANEOUS
  Administered 2024-06-05: 2 [IU] via SUBCUTANEOUS
  Filled 2024-06-01 (×2): qty 1
  Filled 2024-06-01: qty 3
  Filled 2024-06-01 (×2): qty 1

## 2024-06-01 MED ORDER — SODIUM CHLORIDE 0.9 % IV BOLUS
500.0000 mL | Freq: Once | INTRAVENOUS | Status: AC
  Administered 2024-06-01: 23:00:00 500 mL via INTRAVENOUS

## 2024-06-01 MED ORDER — IPRATROPIUM-ALBUTEROL 0.5-2.5 (3) MG/3ML IN SOLN
3.0000 mL | RESPIRATORY_TRACT | Status: AC
  Administered 2024-06-01 (×3): 3 mL via RESPIRATORY_TRACT
  Filled 2024-06-01: qty 3
  Filled 2024-06-01: qty 6

## 2024-06-01 MED ORDER — NOREPINEPHRINE 4 MG/250ML-% IV SOLN
INTRAVENOUS | Status: AC
  Filled 2024-06-01: qty 250

## 2024-06-01 MED ORDER — LACTATED RINGERS IV BOLUS
500.0000 mL | Freq: Once | INTRAVENOUS | Status: AC
  Administered 2024-06-01: 21:00:00 500 mL via INTRAVENOUS

## 2024-06-01 MED ORDER — PANTOPRAZOLE SODIUM 40 MG IV SOLR
40.0000 mg | Freq: Two times a day (BID) | INTRAVENOUS | Status: DC
  Administered 2024-06-02 – 2024-06-05 (×7): 40 mg via INTRAVENOUS
  Filled 2024-06-01 (×7): qty 10

## 2024-06-01 MED ORDER — DOCUSATE SODIUM 100 MG PO CAPS: 100.0000 mg | ORAL_CAPSULE | Freq: Two times a day (BID) | ORAL | Status: DC | PRN

## 2024-06-01 MED ORDER — POLYETHYLENE GLYCOL 3350 17 G PO PACK: 17.0000 g | PACK | Freq: Every day | ORAL | Status: DC | PRN

## 2024-06-01 MED ORDER — CHLORHEXIDINE GLUCONATE CLOTH 2 % EX PADS
6.0000 | MEDICATED_PAD | Freq: Every day | CUTANEOUS | Status: DC
  Administered 2024-06-02 – 2024-06-05 (×5): 6 via TOPICAL

## 2024-06-01 MED ORDER — SODIUM CHLORIDE 0.9 % IV SOLN: 250.0000 mL | INTRAVENOUS | Status: AC

## 2024-06-01 MED ORDER — LACTATED RINGERS IV BOLUS: 500.0000 mL | Freq: Once | INTRAVENOUS | Status: DC

## 2024-06-01 MED ORDER — IOHEXOL 350 MG/ML SOLN
85.0000 mL | Freq: Once | INTRAVENOUS | Status: AC | PRN
  Administered 2024-06-01: 19:00:00 85 mL via INTRAVENOUS

## 2024-06-01 MED ORDER — LACTATED RINGERS IV BOLUS
1000.0000 mL | Freq: Once | INTRAVENOUS | Status: AC
  Administered 2024-06-01: 18:00:00 1000 mL via INTRAVENOUS

## 2024-06-01 MED ADMIN — Fentanyl Citrate PF Soln Prefilled Syringe 50 MCG/ML: 25 ug | INTRAVENOUS | @ 20:00:00 | NDC 63323080801

## 2024-06-01 MED ADMIN — Pantoprazole Sodium For IV Soln 40 MG (Base Equiv): 40 mg | INTRAVENOUS | @ 20:00:00 | NDC 00008092355

## 2024-06-01 MED FILL — Fentanyl Citrate PF Soln Prefilled Syringe 50 MCG/ML: 25.0000 ug | INTRAMUSCULAR | Qty: 1 | Status: AC

## 2024-06-01 NOTE — ED Notes (Signed)
 Dr. Guillermina informed that her BP is now 89/66

## 2024-06-01 NOTE — ED Provider Notes (Signed)
 [place] [facility] Provider Note   HPI/ROS    History obtained from patient and EMS.  Amy Mckay is a 89 y.o. female who presents for Rectal Problems and Shortness of Breath and who  has a past medical history of Acid reflux, COPD (chronic obstructive pulmonary disease) (HCC), GERD (gastroesophageal reflux disease), and Hypertension.  Patient presents today with shortness of breath, chest pain, and rectal prolapse.  States that she initially called EMS 2 hours today because she had suffered another rectal prolapse.  States that this has been happening on and off for the last year since being at the facility.  When EMS got there she was satting in the mid 80s with increased work of breathing.  They gave her 3 DuoNebs and route as well as 125 Solu-Medrol .  On arrival patient on nonrebreather satting in the high 80s in no acute distress.  Is on 3 L of nasal cannula at home at baseline for COPD.  States that she has been struggling with her rectal prolapse pain for over a year now, but today does not feel as bad as it sometimes does.  Denies any rectal bleeding today, but does state it happens in the past.  Denies any fevers, chills, nausea, vomiting.  Does endorse some diarrhea but states this is new for her.  MDM   I have reviewed the nursing documentation, vital signs, as well as the past medical history, surgical history, family history, and social history.  Initial Assessment:  Patient hemodynamically stable on initial evaluation satting in the high 80s on nonrebreather.  Mildly tachy but without evidence of hypotension or overt tachypnea.  Speaking in full sentences and alert and oriented, so lower concern for hypoxia/hypercarbia induced altered mentation.  Rectal prolapse noted at the bedside was just behind the anal verge and did not have significant evidence of prolapse.  Manual reduction of the finger performed at the bedside, with no gross blood  per rectum.  Given patient currently shortness of breath concern for CHF exacerbation versus COPD exacerbation.  Will obtain basic labs as well as troponin, BNP, chest x-ray, and COVID flu.  EKG will be obtained as well.   CBC resulted with hemoglobin of 7.9 down from 14 just 4 months ago.  Mild leukocytosis as well.  Patient is now tachycardic and more hypotensive.  MAP still above 65 but systolics down in the 80s.  Patient given a liter of LR.  Continued to be hypotensive after LR and was given a unit of emergency release whole blood.  Will plan for CT angio of abdomen to evaluate for GI bleed.  Given blood loss has been over several months, less likely to see it on CT but given hemodynamic instability feel necessary.  Patient also given Protonix  and fentanyl  at this time.  My interpretation of the EKG here shows sinus tachycardia with no other obvious ischemia, dysrhythmia, or high-grade AV block.  No pathologic T wave inversions.  CMP here with AKI as well as alkalosis and hypochloremia likely secondary to GI losses.  BUN mildly elevated as well but likely secondary to AKI. Magnesium  within normal limits at 1.8.  UA without evidence of UTI.  COVID and flu negative.  BNP within normal limits at 217.  PT and PTT within normal limits.  CT abdomen here concerning for possible malignancy without evidence of active bleeding.  Did discuss this with the patient and she was overall okay with the news.  Lactate improved  from 3.9-3.6 here in the ED after some volume resuscitation. Surgery contacted given possible malignancy and concerns for recurrent rectal prolapse.  Recommending GI colonoscopy prior to surgical intervention.  Spoke with intensivist regarding admission.  ICU planning for admission given bleeding and hemodynamic instability.  Patient admitted to the ICU for further workup and management.  She was given an additional 500 of LR while here in the ED.  Disposition:  I discussed the case with Maree  who graciously agreed to admit the patient to their service for continued care.   This patient was staffed with Dr. Yolande who supervised the visit and agreed with the plan of care.   Due to the patients current presenting symptoms, physical exam findings, and the workup stated above, it is thought that the etiology of the patients current presentation is:  1. Rectal prolapse   2. Iron deficiency anemia due to chronic blood loss   3. Hypotension, unspecified hypotension type    Clinical Complexity A medically appropriate history, review of systems, and physical exam was performed.  Factors that affect the complexity of this encounter: assessment of correct protocol, laboratory work from this visit, and review of echocardiogram/EKG results  My independent interpretations of diagnostic studies are documented in the ED course above.   If decision rules were used in this patient's evaluation, they are listed below.   Click here for ABCD2, HEART and other calculators   Patient's presentation is most consistent with acute presentation with potential threat to life or bodily function.  MDM generated using voice dictation software and may contain dictation errors. Please contact me for any clarification or with any questions.    Physical Exam, PMH, PSH, Family History, and Social Hsitory   Vitals:   06/01/24 2115 06/01/24 2145 06/01/24 2200 06/01/24 2215  BP: 96/83 99/75 99/71  102/72  Pulse: (!) 115 (!) 113 (!) 114 (!) 113  Resp: 18 15 17 17   Temp: 98.1 F (36.7 C)     TempSrc:      SpO2: 97% 97% 97% 94%    Physical Exam Constitutional:      Appearance: She is well-developed.  HENT:     Head: Normocephalic and atraumatic.  Cardiovascular:     Rate and Rhythm: Regular rhythm. Tachycardia present.  Pulmonary:     Effort: Tachypnea present.     Breath sounds: Examination of the right-middle field reveals decreased breath sounds. Examination of the right-lower field reveals  decreased breath sounds. Decreased breath sounds present. No wheezing or rhonchi.  Abdominal:     Palpations: Abdomen is soft.     Tenderness: There is no abdominal tenderness. There is no guarding.  Genitourinary:    Comments: Small rectal prolapse at the rectal verge.  No significant prolapse of rectum.  No surrounding edema, erythema, or blood. Musculoskeletal:     Right lower leg: Edema present.     Left lower leg: Edema present.  Skin:    General: Skin is warm and dry.     Capillary Refill: Capillary refill takes less than 2 seconds.  Neurological:     General: No focal deficit present.     Mental Status: She is alert and oriented to person, place, and time.     Past Medical History:  Diagnosis Date   Acid reflux    COPD (chronic obstructive pulmonary disease) (HCC)    GERD (gastroesophageal reflux disease)    Hypertension      Past Surgical History:  Procedure Laterality Date   BIOPSY  10/17/2021   Procedure: BIOPSY;  Surgeon: Dianna Specking, MD;  Location: [facility];  Service: Gastroenterology;;   BIOPSY  01/29/2023   Procedure: BIOPSY;  Surgeon: Dianna Specking, MD;  Location: [facility];  Service: Gastroenterology;;   COLONOSCOPY N/A 10/17/2021   Procedure: COLONOSCOPY;  Surgeon: Dianna Specking, MD;  Location: [facility];  Service: Gastroenterology;  Laterality: N/A;   COLONOSCOPY     COLONOSCOPY WITH PROPOFOL  N/A 10/18/2021   Procedure: COLONOSCOPY WITH PROPOFOL ;  Surgeon: Elicia Claw, MD;  Location: [facility];  Service: Gastroenterology;  Laterality: N/A;   ESOPHAGOGASTRODUODENOSCOPY N/A 10/17/2021   Procedure: ESOPHAGOGASTRODUODENOSCOPY (EGD);  Surgeon: Dianna Specking, MD;  Location: [facility];  Service: Gastroenterology;  Laterality: N/A;   ESOPHAGOGASTRODUODENOSCOPY     FLEXIBLE SIGMOIDOSCOPY N/A 01/29/2023   Procedure: FLEXIBLE SIGMOIDOSCOPY;  Surgeon: Dianna Specking, MD;  Location: [facility];  Service: Gastroenterology;  Laterality:  N/A;   POLYPECTOMY  10/18/2021   Procedure: POLYPECTOMY;  Surgeon: Elicia Claw, MD;  Location: [facility];  Service: Gastroenterology;;    Social History   Tobacco Use   Smoking status: Former    Current packs/day: 1.50    Types: Cigarettes   Smokeless tobacco: Never  Substance Use Topics   Alcohol use: No     Procedures   If procedures were preformed on this patient, they are listed below:  Procedures   Electronically signed by:   Glendia Carlin Ancona, M.D. PGY-2, Emergency Medicine   Please note that this documentation was produced with the assistance of voice-to-text technology and may contain errors.    Ancona Glendia, MD 06/01/24 2223

## 2024-06-01 NOTE — ED Triage Notes (Signed)
 Pt arrives via [facility] from [facility] for rectal prolapse. Pt states that she felt as though this has been an ongoing issue. On EMS arrival pt had decreased lung sounds and was c/o SOB. Pt given 1 albuterol , 2 duonebs, and 125 mg solumedrol enroute.

## 2024-06-01 NOTE — ED Notes (Signed)
 Provider notified lactic 3.6 verbal

## 2024-06-01 NOTE — H&P (Signed)
 NAME:  Amy Mckay, MRN:  [id number], DOB:  1934/06/24, LOS: 0 ADMISSION DATE:  06/01/2024, CONSULTATION DATE:  06/01/2024 REFERRING MD:  Lamar Gander, MD CHIEF COMPLAINT:  rectal pain and bleeding  History of Present Illness:  89 y/o female with H/o Dementia, Mood disorder, S/p celiac stent, Anxiety, NSTEMI, Generalized muscle weakness (mostly in wheelchair, able to walk a couple steps with assistance), COPD on 3 liters home O2, HTN, GERD, Iron def Anemia and rectal prolapse who presented from [facility] health and rehab for rectal prolapse pain and found to be anemic and noted to have GI bleed.  She was also noted to be hypotensive in ED.  1 liters IVF and 1 unit PRBC for HgB 7.9.  She also was SOB on admission to ED and was treated for COPD with Solumedrol, duonebs x 2 and Albuterol  x 1.   Currently no active bleeding but has rectal and lower abd pain.  CTA GI did not show any extravasation of blood for embolism and surgery was consulted but she is not a surgical candidate and deferred to GI for evaluation and need for colonic biopsy. Per CT abd,No CT evidence of active gastrointestinal hemorrage. 2. Short segment mid transverse colon irregular wall thickening, concerning for underlying malignancy. Recommend colonoscopy. 3. Rectal prolapse with chronic associated rectal wall thickening. 4. Colonic diverticulosis with no acute diverticulitis .   Pertinent  Medical History  Dementia, Mood disorder, S/p celiac stent, Anxiety, NSTEMI, Generalized muscle weakness, COPD on 3 liters home O2, HTN, GERD, Iron def Anemia and rectal prolapse  Significant Hospital Events: Including procedures, antibiotic start and stop dates in addition to other pertinent events   N/a  Interim History / Subjective:  N/a  Objective    Blood pressure 96/83, pulse (!) 115, temperature 98.1 F (36.7 C), resp. rate 18, SpO2 97%.        Intake/Output Summary (Last 24 hours) at 06/01/2024 2124 Last data  filed at 06/01/2024 1825 Gross per 24 hour  Intake 1000 ml  Output --  Net 1000 ml   There were no vitals filed for this visit.  Examination: General: c/p perineal pain, NAD HENT: PERRLA no icterus EOMI Lungs: CTA and diminished, no wheezes rales and no rhonchi Cardiovascular: Reg s1s2 no murmurs Abdomen: soft nt nd bs pos no guarding Extremities: no cyanosis, clubbing, 2+ pitting LE edema Neuro: AAO x 3, CN II to XII grossly intact GU: clear urine  Resolved problem list   Assessment and Plan  Rectal Prolapse Not a surgical candidate per Sx, also per patient who says she previously had surgical evaluation and was told she was not appropriate for the the surgery GI evaluation in a.m. Pain meds prn GI Bleed Monitor H/H q6hr Diverticulosis possible cause of bleed Trans fuse as needed Hemorrhagic shock Monitor H/H and transfusing prn Currently on 2 mcg Levophed , plan to wean off COPD exacerbation Initial presentation suggesting exacerbation, however, since then she has been speaking in full sentences and no SOB and wheezing ABLA Monitoring H/H H/o chronic iron deficiency anemia Lactic acidosis Possibly from ischemia from GI bleed, hypotension ?Sepsis Does meet criteria No clear source except GI Hold off on antibiotics CAD possible NSTEMI  Monitor serial troponins Not able to anticoagulate Last echo in July was normal, EF 60-65%   Labs   CBC: Recent Labs  Lab 06/01/24 1630 06/01/24 1828  WBC 11.6*  --   NEUTROABS 6.0  --   HGB 7.9* 8.5*  HCT 27.2* 25.0*  MCV 92.8  --   PLT 330  --     Basic Metabolic Panel: Recent Labs  Lab 06/01/24 1630 06/01/24 1828  NA 141 140  K 4.1 4.3  CL 96* 94*  CO2 34*  --   GLUCOSE 130* 153*  BUN 24* 30*  CREATININE 1.09* 1.20*  CALCIUM  9.3  --   MG 1.8  --    GFR: CrCl cannot be calculated (Unknown ideal weight.). Recent Labs  Lab 06/01/24 1630 06/01/24 1829 06/01/24 2006  WBC 11.6*  --   --   LATICACIDVEN   --  3.9* 3.6*    Liver Function Tests: Recent Labs  Lab 06/01/24 1630  AST 26  ALT 13  ALKPHOS 109  BILITOT <0.2  PROT 7.2  ALBUMIN 4.0   No results for input(s): LIPASE, AMYLASE in the last 168 hours. No results for input(s): AMMONIA in the last 168 hours.  ABG    Component Value Date/Time   HCO3 30.0 (H) 06/12/2023 2353   TCO2 36 (H) 06/01/2024 1828   O2SAT 57 06/12/2023 2353     Coagulation Profile: Recent Labs  Lab 06/01/24 1958  INR 0.9    Cardiac Enzymes: No results for input(s): CKTOTAL, CKMB, CKMBINDEX, TROPONINI in the last 168 hours.  HbA1C: Hgb A1c MFr Bld  Date/Time Value Ref Range Status  12/31/2023 01:27 PM 5.5 4.8 - 5.6 % Final    Comment:    (NOTE) Diagnosis of Diabetes The following HbA1c ranges recommended by the American Diabetes Association (ADA) may be used as an aid in the diagnosis of diabetes mellitus.  Hemoglobin             Suggested A1C NGSP%              Diagnosis  <5.7                   Non Diabetic  5.7-6.4                Pre-Diabetic  >6.4                   Diabetic  <7.0                   Glycemic control for                       adults with diabetes.    06/13/2023 05:55 AM 5.9 (H) 4.8 - 5.6 % Final    Comment:    (NOTE)         Prediabetes: 5.7 - 6.4         Diabetes: >6.4         Glycemic control for adults with diabetes: <7.0     CBG: No results for input(s): GLUCAP in the last 168 hours.  Review of Systems:   Rectal pain  Past Medical History:  She,  has a past medical history of Acid reflux, COPD (chronic obstructive pulmonary disease) (HCC), GERD (gastroesophageal reflux disease), and Hypertension.   Surgical History:   Past Surgical History:  Procedure Laterality Date   BIOPSY  10/17/2021   Procedure: BIOPSY;  Surgeon: Dianna Specking, MD;  Location: [facility];  Service: Gastroenterology;;   BIOPSY  01/29/2023   Procedure: BIOPSY;  Surgeon: Dianna Specking, MD;  Location: [facility];  Service: Gastroenterology;;   COLONOSCOPY N/A 10/17/2021   Procedure: COLONOSCOPY;  Surgeon: Dianna Specking, MD;  Location: [facility];  Service: Gastroenterology;  Laterality: N/A;   COLONOSCOPY     COLONOSCOPY WITH PROPOFOL  N/A 10/18/2021   Procedure: COLONOSCOPY WITH PROPOFOL ;  Surgeon: Elicia Claw, MD;  Location: [facility];  Service: Gastroenterology;  Laterality: N/A;   ESOPHAGOGASTRODUODENOSCOPY N/A 10/17/2021   Procedure: ESOPHAGOGASTRODUODENOSCOPY (EGD);  Surgeon: Dianna Specking, MD;  Location: [facility];  Service: Gastroenterology;  Laterality: N/A;   ESOPHAGOGASTRODUODENOSCOPY     FLEXIBLE SIGMOIDOSCOPY N/A 01/29/2023   Procedure: FLEXIBLE SIGMOIDOSCOPY;  Surgeon: Dianna Specking, MD;  Location: [facility];  Service: Gastroenterology;  Laterality: N/A;   POLYPECTOMY  10/18/2021   Procedure: POLYPECTOMY;  Surgeon: Elicia Claw, MD;  Location: [facility];  Service: Gastroenterology;;     Social History:   reports that she has quit smoking. Her smoking use included cigarettes. She has never used smokeless tobacco. She reports that she does not drink alcohol and does not use drugs.   Family History:  Her family history is not on file.   Allergies Allergies[1]   Home Medications  Prior to Admission medications  Medication Sig Start Date End Date Taking? Authorizing Provider  acetaminophen  (TYLENOL ) 500 MG tablet Take 1,000 mg by mouth in the morning, at noon, and at bedtime.   Yes [provider]  aspirin  EC 81 MG tablet Take 81 mg by mouth daily.   Yes [provider]  atorvastatin  (LIPITOR ) 80 MG tablet Take 80 mg by mouth at bedtime.   Yes [provider]  Budeson-Glycopyrrol-Formoterol  (BREZTRI  AEROSPHERE) 160-9-4.8 MCG/ACT AERO Inhale 2 puffs into the lungs in the morning and at bedtime. 07/01/23  Yes Shelah Lamar RAMAN, MD  Calcium  Carb-Cholecalciferol  600-10 MG-MCG TABS Take 1 tablet by mouth daily.   Yes [provider]  dextromethorphan (DELSYM) 30 MG/5ML liquid Take 10 mLs by mouth every 12 (twelve) hours as needed for cough.   Yes [provider]  DULoxetine  (CYMBALTA ) 60 MG capsule Take 60 mg by mouth daily.   Yes [provider]  fluticasone  [facility] ALLERGY RELIEF) 50 MCG/ACT nasal spray Place 1 spray into both nostrils daily.   Yes [provider]  gabapentin  (NEURONTIN ) 100 MG capsule Take 100 mg by mouth 3 (three) times daily. Give with 400mg  capsules to equal 500mg  three times daily.   Yes [provider]  gabapentin  (NEURONTIN ) 400 MG capsule Take 400 mg by mouth 3 (three) times daily. Give with 100mg  capsules to equal 500mg  three times daily.   Yes [provider]  guaiFENesin  (ROBITUSSIN) 100 MG/5ML liquid Take 10 mLs by mouth in the morning, at noon, and at bedtime.   Yes [provider]  ipratropium-albuterol  (DUONEB) 0.5-2.5 (3) MG/3ML SOLN Take 3 mLs by nebulization every 6 (six) hours.   Yes [provider]  levothyroxine  (SYNTHROID , LEVOTHROID) 75 MCG tablet Take 75 mcg by mouth daily before breakfast. 02/05/16  Yes [provider]  loperamide (IMODIUM A-D) 2 MG tablet Take 4 mg by mouth as needed for diarrhea or loose stools.   Yes [provider]  memantine  (NAMENDA ) 10 MG tablet Take 10 mg by mouth 2 (two) times daily.   Yes [provider]  metoprolol  succinate (TOPROL -XL) 25 MG 24 hr tablet Take 25 mg by mouth daily.   Yes [provider]  Miconazole POWD Apply 1 application  topically See admin instructions. Apply a thin dusting to groin area twice a day between meals as needed for rash.   Yes [provider]  ondansetron  (ZOFRAN -ODT) 4 MG disintegrating tablet Take 4 mg by mouth every  8 (eight) hours as needed.   Yes [provider]  oxyCODONE  (ROXICODONE ) 15 MG immediate release tablet Take 15 mg by mouth daily as needed for pain.   Yes [provider]   Oxycodone  HCl 10 MG TABS Take 1 tablet by mouth in the morning, at noon, in the evening, and at bedtime.   Yes [provider]  pantoprazole  (PROTONIX ) 40 MG tablet Take 1 tablet (40 mg total) by mouth 2 (two) times daily. 11/12/22 06/01/24 Yes Cherlyn Labella, MD  phenylephrine -shark liver oil-mineral oil-petrolatum (PREPARATION H) 0.25-14-74.9 % rectal ointment Place 1 Application rectally 4 (four) times daily.   Yes [provider]  polyethylene glycol powder (GLYCOLAX /MIRALAX ) 17 GM/SCOOP powder Take 17 g by mouth daily as needed for mild constipation or moderate constipation.   Yes [provider]  pramipexole  (MIRAPEX ) 0.25 MG tablet Take 0.25 mg by mouth 2 (two) times daily.   Yes [provider]  promethazine (PHENERGAN) 12.5 MG tablet Take 12.5 mg by mouth every 8 (eight) hours as needed for nausea.   Yes [provider]  sodium chloride  (OCEAN) 0.65 % SOLN nasal spray Place 1 spray into both nostrils 3 (three) times daily.   Yes [provider]  ticagrelor  (BRILINTA ) 90 MG TABS tablet Take 1 tablet (90 mg total) by mouth 2 (two) times daily. 11/06/22  Yes Gonfa, Taye T, MD  torsemide (DEMADEX) 20 MG tablet Take 40 mg by mouth 2 (two) times daily.   Yes [provider]  UNABLE TO FIND Apply 1 Application topically in the morning and at bedtime. Diltiazem 2% + Lidocaine  2% Compound Solution 05/29/24  Yes [provider]     Critical care time: 68   The patient is critically ill with multiple organ system failure and requires high complexity decision making for assessment and support, frequent evaluation and titration of therapies, advanced monitoring, review of radiographic studies and interpretation of complex data.   Critical Care Time devoted to patient care services, exclusive of separately billable procedures, described in this note is 35 minutes.   Orlin Fairly, MD [facility] See Amion for  pager  If no response to pager , please call [phone] until 7pm After 7:00 pm call Elink  [phone] 06/01/2024, 11:19 PM             [1]  Allergies Allergen Reactions   Penicillins Anaphylaxis, Swelling and Other (See Comments)   Penicillin G Hives   Penicillins Other (See Comments)    Tolerated zosyn  12/31/2023

## 2024-06-02 DIAGNOSIS — G8929 Other chronic pain: Secondary | ICD-10-CM

## 2024-06-02 DIAGNOSIS — E872 Acidosis, unspecified: Secondary | ICD-10-CM

## 2024-06-02 DIAGNOSIS — J449 Chronic obstructive pulmonary disease, unspecified: Secondary | ICD-10-CM

## 2024-06-02 DIAGNOSIS — K5791 Diverticulosis of intestine, part unspecified, without perforation or abscess with bleeding: Secondary | ICD-10-CM

## 2024-06-02 DIAGNOSIS — I5020 Unspecified systolic (congestive) heart failure: Secondary | ICD-10-CM

## 2024-06-02 DIAGNOSIS — D72829 Elevated white blood cell count, unspecified: Secondary | ICD-10-CM

## 2024-06-02 DIAGNOSIS — I5022 Chronic systolic (congestive) heart failure: Secondary | ICD-10-CM

## 2024-06-02 DIAGNOSIS — K623 Rectal prolapse: Principal | ICD-10-CM

## 2024-06-02 DIAGNOSIS — I110 Hypertensive heart disease with heart failure: Secondary | ICD-10-CM

## 2024-06-02 DIAGNOSIS — Z8673 Personal history of transient ischemic attack (TIA), and cerebral infarction without residual deficits: Secondary | ICD-10-CM

## 2024-06-02 LAB — CBC
HCT: 32.8 % — ABNORMAL LOW (ref 36.0–46.0)
HCT: 31.5 % — ABNORMAL LOW (ref 36.0–46.0)
HCT: 29.3 % — ABNORMAL LOW (ref 36.0–46.0)
Hemoglobin: 10.2 g/dL — ABNORMAL LOW (ref 12.0–15.0)
Hemoglobin: 9.8 g/dL — ABNORMAL LOW (ref 12.0–15.0)
Hemoglobin: 9.2 g/dL — ABNORMAL LOW (ref 12.0–15.0)
MCH: 28.2 pg (ref 26.0–34.0)
MCH: 28.1 pg (ref 26.0–34.0)
MCH: 28.7 pg (ref 26.0–34.0)
MCHC: 31.1 g/dL (ref 30.0–36.0)
MCHC: 31.1 g/dL (ref 30.0–36.0)
MCHC: 31.4 g/dL (ref 30.0–36.0)
MCV: 90.6 fL (ref 80.0–100.0)
MCV: 90.3 fL (ref 80.0–100.0)
MCV: 91.3 fL (ref 80.0–100.0)
Platelets: 353 K/uL (ref 150–400)
Platelets: 327 K/uL (ref 150–400)
Platelets: 319 K/uL (ref 150–400)
RBC: 3.62 MIL/uL — ABNORMAL LOW (ref 3.87–5.11)
RBC: 3.49 MIL/uL — ABNORMAL LOW (ref 3.87–5.11)
RBC: 3.21 MIL/uL — ABNORMAL LOW (ref 3.87–5.11)
RDW: 14.3 % (ref 11.5–15.5)
RDW: 14.6 % (ref 11.5–15.5)
RDW: 14.7 % (ref 11.5–15.5)
WBC: 9.6 K/uL (ref 4.0–10.5)
WBC: 11.5 K/uL — ABNORMAL HIGH (ref 4.0–10.5)
WBC: 13.2 K/uL — ABNORMAL HIGH (ref 4.0–10.5)
nRBC: 0.3 % — ABNORMAL HIGH (ref 0.0–0.2)
nRBC: 0.2 % (ref 0.0–0.2)
nRBC: 0 % (ref 0.0–0.2)

## 2024-06-02 LAB — TYPE AND SCREEN
ABO/RH(D): O NEG
Antibody Screen: POSITIVE
Unit division: 0

## 2024-06-02 LAB — GLUCOSE, CAPILLARY
Glucose-Capillary: 100 mg/dL — ABNORMAL HIGH (ref 70–99)
Glucose-Capillary: 102 mg/dL — ABNORMAL HIGH (ref 70–99)
Glucose-Capillary: 115 mg/dL — ABNORMAL HIGH (ref 70–99)
Glucose-Capillary: 120 mg/dL — ABNORMAL HIGH (ref 70–99)
Glucose-Capillary: 137 mg/dL — ABNORMAL HIGH (ref 70–99)
Glucose-Capillary: 149 mg/dL — ABNORMAL HIGH (ref 70–99)
Glucose-Capillary: 92 mg/dL (ref 70–99)

## 2024-06-02 LAB — BPAM RBC
Blood Product Expiration Date: 202512312359
ISSUE DATE / TIME: 202512182243
Unit Type and Rh: 5100

## 2024-06-02 LAB — BASIC METABOLIC PANEL WITH GFR
Anion gap: 16 — ABNORMAL HIGH (ref 5–15)
BUN: 19 mg/dL (ref 8–23)
CO2: 32 mmol/L (ref 22–32)
Calcium: 9.8 mg/dL (ref 8.9–10.3)
Chloride: 96 mmol/L — ABNORMAL LOW (ref 98–111)
Creatinine, Ser: 0.92 mg/dL (ref 0.44–1.00)
GFR, Estimated: >60 mL/min
Glucose, Bld: 128 mg/dL — ABNORMAL HIGH (ref 70–99)
Potassium: 3.8 mmol/L (ref 3.5–5.1)
Sodium: 144 mmol/L (ref 135–145)

## 2024-06-02 LAB — IRON AND TIBC
Iron: 20 ug/dL — ABNORMAL LOW (ref 28–170)
Saturation Ratios: 4 % — ABNORMAL LOW (ref 10.4–31.8)
TIBC: 455 ug/dL — ABNORMAL HIGH (ref 250–450)
UIBC: 435 ug/dL

## 2024-06-02 LAB — TROPONIN T, HIGH SENSITIVITY
Troponin T High Sensitivity: 27 ng/L — ABNORMAL HIGH (ref 0–19)
Troponin T High Sensitivity: 30 ng/L — ABNORMAL HIGH (ref 0–19)

## 2024-06-02 LAB — MAGNESIUM: Magnesium: 1.6 mg/dL — ABNORMAL LOW (ref 1.7–2.4)

## 2024-06-02 LAB — RETICULOCYTES
Immature Retic Fract: 39.4 % — ABNORMAL HIGH (ref 2.3–15.9)
RBC.: 3.42 MIL/uL — ABNORMAL LOW (ref 3.87–5.11)
Retic Count, Absolute: 55.4 K/uL (ref 19.0–186.0)
Retic Ct Pct: 1.6 % (ref 0.4–3.1)

## 2024-06-02 LAB — LACTIC ACID, PLASMA: Lactic Acid, Venous: 1.3 mmol/L (ref 0.5–1.9)

## 2024-06-02 LAB — VITAMIN B12: Vitamin B-12: 1124 pg/mL — ABNORMAL HIGH (ref 180–914)

## 2024-06-02 LAB — MRSA NEXT GEN BY PCR, NASAL: MRSA by PCR Next Gen: NOT DETECTED

## 2024-06-02 LAB — FOLATE: Folate: 14.3 ng/mL

## 2024-06-02 LAB — FERRITIN: Ferritin: 29 ng/mL (ref 11–307)

## 2024-06-02 LAB — PHOSPHORUS: Phosphorus: 3 mg/dL (ref 2.5–4.6)

## 2024-06-02 MED ORDER — IPRATROPIUM-ALBUTEROL 0.5-2.5 (3) MG/3ML IN SOLN: 3.0000 mL | Freq: Four times a day (QID) | RESPIRATORY_TRACT | Status: DC | PRN

## 2024-06-02 MED ORDER — LEVOTHYROXINE SODIUM 75 MCG PO TABS
75.0000 ug | ORAL_TABLET | Freq: Every day | ORAL | Status: DC
  Administered 2024-06-03 – 2024-06-05 (×3): 75 ug via ORAL
  Filled 2024-06-02 (×3): qty 1

## 2024-06-02 MED ORDER — POLYETHYLENE GLYCOL 3350 17 G PO PACK
17.0000 g | PACK | Freq: Four times a day (QID) | ORAL | Status: DC
  Administered 2024-06-02 – 2024-06-03 (×5): 17 g via ORAL
  Filled 2024-06-02 (×6): qty 1

## 2024-06-02 MED ORDER — HYDROMORPHONE HCL 1 MG/ML IJ SOLN
0.5000 mg | Freq: Once | INTRAMUSCULAR | Status: AC
  Administered 2024-06-02: 0.5 mg via INTRAVENOUS

## 2024-06-02 MED ORDER — BUDESON-GLYCOPYRROL-FORMOTEROL 160-9-4.8 MCG/ACT IN AERO
2.0000 | INHALATION_SPRAY | Freq: Two times a day (BID) | RESPIRATORY_TRACT | Status: DC
  Administered 2024-06-02 – 2024-06-05 (×6): 2 via RESPIRATORY_TRACT
  Filled 2024-06-02: qty 5.9

## 2024-06-02 MED ORDER — ACETAMINOPHEN 500 MG PO TABS
1000.0000 mg | ORAL_TABLET | Freq: Three times a day (TID) | ORAL | Status: DC
  Administered 2024-06-02 – 2024-06-05 (×11): 1000 mg via ORAL
  Filled 2024-06-02 (×11): qty 2

## 2024-06-02 MED ORDER — ORAL CARE MOUTH RINSE: 15.0000 mL | OROMUCOSAL | Status: DC | PRN

## 2024-06-02 MED ORDER — FENTANYL CITRATE (PF) 50 MCG/ML IJ SOSY: 25.0000 ug | PREFILLED_SYRINGE | Freq: Once | INTRAMUSCULAR | Status: DC

## 2024-06-02 MED ORDER — MORPHINE SULFATE (PF) 2 MG/ML IV SOLN
2.0000 mg | INTRAVENOUS | Status: DC | PRN
  Administered 2024-06-02 (×2): 2 mg via INTRAVENOUS
  Filled 2024-06-02: qty 1

## 2024-06-02 MED ORDER — DULOXETINE HCL 60 MG PO CPEP
60.0000 mg | ORAL_CAPSULE | Freq: Every day | ORAL | Status: DC
  Administered 2024-06-02 – 2024-06-05 (×4): 60 mg via ORAL
  Filled 2024-06-02 (×4): qty 1

## 2024-06-02 MED ORDER — FLUTICASONE PROPIONATE 50 MCG/ACT NA SUSP
1.0000 | Freq: Every day | NASAL | Status: DC
  Administered 2024-06-02 – 2024-06-05 (×4): 1 via NASAL
  Filled 2024-06-02: qty 16

## 2024-06-02 MED ORDER — OXYCODONE HCL 5 MG PO TABS
10.0000 mg | ORAL_TABLET | ORAL | Status: DC | PRN
  Administered 2024-06-02 – 2024-06-05 (×12): 10 mg via ORAL
  Filled 2024-06-02 (×12): qty 2

## 2024-06-02 MED ORDER — LACTATED RINGERS IV BOLUS
500.0000 mL | Freq: Once | INTRAVENOUS | Status: AC
  Administered 2024-06-02: 500 mL via INTRAVENOUS

## 2024-06-02 MED ORDER — HYDROMORPHONE HCL 1 MG/ML IJ SOLN
INTRAMUSCULAR | Status: AC
  Filled 2024-06-02: qty 1

## 2024-06-02 MED ORDER — HYDROMORPHONE HCL 1 MG/ML IJ SOLN
0.5000 mg | INTRAMUSCULAR | Status: DC | PRN
  Administered 2024-06-02 – 2024-06-05 (×14): 0.5 mg via INTRAVENOUS
  Filled 2024-06-02 (×4): qty 1
  Filled 2024-06-02 (×2): qty 0.5
  Filled 2024-06-02: qty 1
  Filled 2024-06-02 (×2): qty 0.5
  Filled 2024-06-02 (×2): qty 1
  Filled 2024-06-02: qty 0.5
  Filled 2024-06-02: qty 1
  Filled 2024-06-02 (×2): qty 0.5

## 2024-06-02 MED ORDER — GABAPENTIN 400 MG PO CAPS
500.0000 mg | ORAL_CAPSULE | Freq: Three times a day (TID) | ORAL | Status: DC
  Administered 2024-06-02 – 2024-06-04 (×7): 500 mg via ORAL
  Filled 2024-06-02 (×8): qty 1

## 2024-06-02 MED ORDER — MAGNESIUM SULFATE 4 GM/100ML IV SOLN
4.0000 g | Freq: Once | INTRAVENOUS | Status: AC
  Administered 2024-06-02: 4 g via INTRAVENOUS
  Filled 2024-06-02: qty 100

## 2024-06-02 MED ORDER — HYDROMORPHONE HCL 1 MG/ML IJ SOLN
0.5000 mg | Freq: Once | INTRAMUSCULAR | Status: AC
  Administered 2024-06-02: 0.5 mg via INTRAVENOUS
  Filled 2024-06-02: qty 1

## 2024-06-02 MED ORDER — ATORVASTATIN CALCIUM 80 MG PO TABS
80.0000 mg | ORAL_TABLET | Freq: Every day | ORAL | Status: DC
  Administered 2024-06-02 – 2024-06-04 (×3): 80 mg via ORAL
  Filled 2024-06-02 (×3): qty 1

## 2024-06-02 MED ORDER — LACTATED RINGERS IV SOLN: INTRAVENOUS | Status: AC

## 2024-06-02 MED ORDER — HYDROMORPHONE HCL 1 MG/ML IJ SOLN: 0.5000 mg | INTRAMUSCULAR | Status: DC | PRN

## 2024-06-02 MED ORDER — PRAMIPEXOLE DIHYDROCHLORIDE 0.25 MG PO TABS
0.2500 mg | ORAL_TABLET | Freq: Two times a day (BID) | ORAL | Status: DC
  Administered 2024-06-02 – 2024-06-05 (×7): 0.25 mg via ORAL
  Filled 2024-06-02 (×8): qty 1

## 2024-06-02 MED ORDER — FERROUS SULFATE 325 (65 FE) MG PO TABS: 325.0000 mg | ORAL_TABLET | Freq: Two times a day (BID) | ORAL | Status: DC

## 2024-06-02 MED ORDER — MEMANTINE HCL 10 MG PO TABS
10.0000 mg | ORAL_TABLET | Freq: Two times a day (BID) | ORAL | Status: DC
  Administered 2024-06-02 – 2024-06-05 (×7): 10 mg via ORAL
  Filled 2024-06-02 (×7): qty 1

## 2024-06-02 MED ORDER — ACETAMINOPHEN 10 MG/ML IV SOLN
1000.0000 mg | Freq: Four times a day (QID) | INTRAVENOUS | Status: DC | PRN
  Administered 2024-06-02: 1000 mg via INTRAVENOUS
  Filled 2024-06-02: qty 100

## 2024-06-02 MED ORDER — POTASSIUM CHLORIDE 10 MEQ/100ML IV SOLN
10.0000 meq | INTRAVENOUS | Status: AC
  Administered 2024-06-02 (×2): 10 meq via INTRAVENOUS
  Filled 2024-06-02 (×2): qty 100

## 2024-06-02 NOTE — Consult Note (Addendum)
 "  NAME:  Amy Mckay, MRN:  [id number], DOB:  1934/12/18, LOS: 1 ADMISSION DATE:  06/01/2024, CONSULTATION DATE:  06/01/2024 REFERRING MD:  Lamar Gander, MD CHIEF COMPLAINT:  rectal pain and bleeding  History of Present Illness:  89 y/o female with H/o Dementia, Mood disorder, S/p celiac stent, Anxiety, NSTEMI, Generalized muscle weakness (mostly in wheelchair, able to walk a couple steps with assistance), COPD on 3 liters home O2, HTN, GERD, Iron def Anemia and rectal prolapse who presented from [facility] health and rehab for rectal prolapse pain and found to be anemic and noted to have GI bleed.  She was also noted to be hypotensive in ED.  1 liters IVF and 1 unit PRBC for HgB 7.9.  She also was SOB on admission to ED and was treated for COPD with Solumedrol, duonebs x 2 and Albuterol  x 1.   Currently no active bleeding but has rectal and lower abd pain.  CTA GI did not show any extravasation of blood for embolism and surgery was consulted but she is not a surgical candidate and deferred to GI for evaluation and need for colonic biopsy. Per CT abd,No CT evidence of active gastrointestinal hemorrage. 2. Short segment mid transverse colon irregular wall thickening, concerning for underlying malignancy. Recommend colonoscopy. 3. Rectal prolapse with chronic associated rectal wall thickening. 4. Colonic diverticulosis with no acute diverticulitis .   Pertinent  Medical History  Dementia, Mood disorder, S/p celiac stent, Anxiety, NSTEMI, Generalized muscle weakness, COPD on 3 liters home O2, HTN, GERD, Iron def Anemia and rectal prolapse  Significant Hospital Events: Including procedures, antibiotic start and stop dates in addition to other pertinent events   06/01/2024: Admitted for possible GI bleed, hypotension   Interim History / Subjective:  Off levophed . Hgb improved significantly with 1 PRBC from 7.9 on admission to 10.2 this morning. Continues to complain of diffuse pain  everywhere especially rectal pain.   Objective    Blood pressure (!) 121/97, pulse (!) 111, temperature 98.5 F (36.9 C), temperature source Axillary, resp. rate 11, height 5' 1 (1.549 m), weight 74.1 kg, SpO2 94%.        Intake/Output Summary (Last 24 hours) at 06/02/2024 0947 Last data filed at 06/02/2024 9268 Gross per 24 hour  Intake 2674.1 ml  Output 375 ml  Net 2299.1 ml   Filed Weights   06/02/24 0050  Weight: 74.1 kg    Examination: General: NAD HENT: [place]/AT, no icterus Lungs: breathing comfortably on nasal cannula, CTAB Cardiovascular: regular rate and rhythm, normal S1 and S2, no m/r/g Abdomen: soft, non-tender, non-distended, +BS Extremities: warm, dry, trace edema Neuro: alert and oriented, answers questions appropriately GU: deferred  Resolved problem list   Hemorrhagic shock, now off levophed  and hemoglobin stable   Assessment and Plan   GI bleed Rectal prolapse Diverticulosis  Anemia, likely ABLA on chronic  Lactic acidosis, likely from hemorrhagic shock Patient endorses both melanotic stool and BRBPR for the past several months. Seen by surgery last admission and not a candidate for surgery. CTA GI bleed protocol showed no active bleed, diverticulosis, short segment of mid transverse colon with irregular wall thickening concerning for malignancy, colonic diverticulosis with no acute diverticulitis, and rectal prolapse. No further signs of bleeding, Hgb stable.  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 No localizing source of infection. - Continue to monitor CBC, fever curve- hold off on antibiotics for now  History of left basal ganglia CVA  - Holding home ASA and Brilinta  in the setting of GIB  - Continue statin  Acute on chronic pain - Resume reduced dose home oxycodone  - Resume home gabapentin , APAP and Cymbalta    Cognitive impairment  - Continue home namenda   - PT/OT eval  Patient is off vasopressors and stable for transfer, will request [facility] assume care 12/20. PCCM will sign off and be available as needed.  Discussed goals of care with Ms. Losh. She wishes to remain full code but endorses that she would not be on the ventilator for a prolonged period of time.   Labs   CBC: Recent Labs  Lab 06/01/24 1630 06/01/24 1828 06/01/24 2120 06/02/24 0524  WBC 11.6*  --  12.7* 9.6  NEUTROABS 6.0  --   --   --   HGB 7.9* 8.5* 9.5* 10.2*  HCT 27.2* 25.0* 30.6* 32.8*  MCV 92.8  --  91.9 90.6  PLT 330  --  317 353    Basic Metabolic Panel: Recent Labs  Lab 06/01/24 1630 06/01/24 1828 06/02/24 0524  NA 141 140 144  K 4.1 4.3 3.8  CL 96* 94* 96*  CO2 34*  --  32  GLUCOSE 130* 153* 128*  BUN 24* 30* 19  CREATININE 1.09* 1.20* 0.92  CALCIUM  9.3  --  9.8  MG 1.8  --  1.6*  PHOS  --   --  3.0   GFR: Estimated Creatinine Clearance: 39.6 mL/min (by C-G formula based on SCr of 0.92 mg/dL). Recent Labs  Lab 06/01/24 1630 06/01/24 1829 06/01/24 2006 06/01/24 2120 06/02/24 0524  WBC 11.6*  --   --  12.7* 9.6  LATICACIDVEN  --  3.9* 3.6*  --   --     Liver Function Tests: Recent Labs  Lab 06/01/24 1630 06/01/24 2120  AST 26  --   ALT 13  --   ALKPHOS 109  --   BILITOT <0.2 0.5  PROT 7.2  --   ALBUMIN 4.0  --    No results for  input(s): LIPASE, AMYLASE in the last 168 hours. No results for input(s): AMMONIA in the last 168 hours.  ABG    Component Value Date/Time   HCO3 30.0 (H) 06/12/2023 2353   TCO2 36 (H) 06/01/2024 1828   O2SAT 57 06/12/2023 2353     Coagulation Profile: Recent Labs  Lab 06/01/24 1958  INR 0.9    Cardiac Enzymes: No results for input(s): CKTOTAL, CKMB, CKMBINDEX, TROPONINI in the last 168 hours.  HbA1C: Hgb A1c MFr Bld  Date/Time Value Ref Range Status  12/31/2023 01:27 PM 5.5 4.8 - 5.6 % Final    Comment:    (NOTE) Diagnosis of Diabetes The following HbA1c ranges recommended by the American Diabetes Association (ADA) may be used as an aid in the diagnosis of diabetes mellitus.  Hemoglobin  Suggested A1C NGSP%              Diagnosis  <5.7                   Non Diabetic  5.7-6.4                Pre-Diabetic  >6.4                   Diabetic  <7.0                   Glycemic control for                       adults with diabetes.    06/13/2023 05:55 AM 5.9 (H) 4.8 - 5.6 % Final    Comment:    (NOTE)         Prediabetes: 5.7 - 6.4         Diabetes: >6.4         Glycemic control for adults with diabetes: <7.0     CBG: Recent Labs  Lab 06/02/24 0040 06/02/24 0310 06/02/24 0759  GLUCAP 149* 137* 115*   Critical care time:    Rexene LOISE Tanda DEVONNA  Pulmonary & Critical Care 06/02/2024 9:47 AM  Please see [email or web address] for pager details.  From 7A-7P if no response, please call [phone] After hours, please call ELink [phone]        "

## 2024-06-02 NOTE — Progress Notes (Signed)
 eLink Physician-Brief Progress Note Patient Name: Amy Mckay DOB: 1935-03-14 MRN: [id number]   Date of Service  06/02/2024  HPI/Events of Note  Patient admitted with GI bleed, acute blood loss anemia, metabolic encephalopathy, and altered mental status. Work up in progress.  eICU Interventions  New Patient Evaluation.        Kiyoshi Schaab U Cody Albus 06/02/2024, 1:40 AM

## 2024-06-02 NOTE — Progress Notes (Signed)
 eLink Physician-Brief Progress Note Patient Name: Amy Mckay DOB: 13-Aug-1934 MRN: [id number]   Date of Service  06/02/2024  HPI/Events of Note  Patient still complaining of pain despite iv Dilaudid  and Tylenol .  eICU Interventions  PRN Morphine  interval reduced to Q 3 hours and a dose of Morphine  given now.        [name] 06/02/2024, 4:02 AM

## 2024-06-02 NOTE — Progress Notes (Signed)
 Daughter, [name], called and updated on pt admission and status. All questions/concerns addressed.

## 2024-06-02 NOTE — Progress Notes (Signed)
 Case discussed with critical care PA and will see tomorrow and will try clear liquid and MiraLAX  4 times a day to see if we can slowly prep her for early next weekAnd will try to discuss colonoscopy versus Cologuard with family at that time

## 2024-06-02 NOTE — Plan of Care (Signed)
" °  Problem: Education: Goal: Ability to describe self-care measures that may prevent or decrease complications (Diabetes Survival Skills Education) will improve Outcome: Progressing   Problem: Coping: Goal: Ability to adjust to condition or change in health will improve Outcome: Progressing   Problem: Fluid Volume: Goal: Ability to maintain a balanced intake and output will improve Outcome: Progressing   Problem: Metabolic: Goal: Ability to maintain appropriate glucose levels will improve Outcome: Progressing   Problem: Skin Integrity: Goal: Risk for impaired skin integrity will decrease Outcome: Progressing   Problem: Tissue Perfusion: Goal: Adequacy of tissue perfusion will improve Outcome: Progressing   Problem: Education: Goal: Knowledge of General Education information will improve Description: Including pain rating scale, medication(s)/side effects and non-pharmacologic comfort measures Outcome: Progressing   Problem: Clinical Measurements: Goal: Ability to maintain clinical measurements within normal limits will improve Outcome: Progressing Goal: Diagnostic test results will improve Outcome: Progressing Goal: Respiratory complications will improve Outcome: Progressing Goal: Cardiovascular complication will be avoided Outcome: Progressing   "

## 2024-06-02 NOTE — TOC Initial Note (Signed)
 Transition of Care [facility]) - Initial/Assessment Note    Patient Details  Name: Amy Mckay MRN: [id number] Date of Birth: [birth year]  Transition of Care [facility]) CM/SW Contact:    Inocente GORMAN Kindle, LCSW Phone Number: 06/02/2024, 5:52 PM  Clinical Narrative:                 Patient admitted from [facility] SNF under long term care services. CSW will continue to follow for medical workup.   Expected Discharge Plan: Skilled Nursing Facility Barriers to Discharge: Continued Medical Work up   Patient Goals and CMS Choice            Expected Discharge Plan and Services In-house Referral: Clinical Social Work   Post Acute Care Choice: Skilled Nursing Facility Living arrangements for the past 2 months: Skilled Nursing Facility                                      Prior Living Arrangements/Services Living arrangements for the past 2 months: Skilled Nursing Facility Lives with:: Facility Resident Patient language and need for interpreter reviewed:: Yes Do you feel safe going back to the place where you live?: Yes      Need for Family Participation in Patient Care: Yes (Comment) Care giver support system in place?: Yes (comment)   Criminal Activity/Legal Involvement Pertinent to Current Situation/Hospitalization: No - Comment as needed  Activities of Daily Living      Permission Sought/Granted Permission sought to share information with : Photographer granted to share info w AGENCY: [place]        Emotional Assessment Appearance:: Appears stated age Attitude/Demeanor/Rapport: Engaged Affect (typically observed): Appropriate Orientation: : Oriented to Self, Oriented to Place, Oriented to  Time, Oriented to Situation Alcohol / Substance Use: Not Applicable Psych Involvement: No (comment)  Admission diagnosis:  Rectal prolapse [K62.3] Hemorrhagic shock (HCC) [R57.8] Iron deficiency anemia due to chronic blood loss  [D50.0] Hypotension, unspecified hypotension type [I95.9] Patient Active Problem List   Diagnosis Date Noted   Rectal prolapse 06/02/2024   Gastrointestinal hemorrhage associated with intestinal diverticulosis 06/02/2024   Chronic systolic heart failure (HCC) 06/02/2024   Hemorrhagic shock (HCC) 06/01/2024   Septic shock (HCC) 12/31/2023   NSTEMI (non-ST elevated myocardial infarction) (HCC) 06/13/2023   Respiratory failure, acute (HCC) 06/13/2023   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/28/2023   History of CVA (cerebrovascular accident) 11/03/2022   Heme positive stool 10/17/2021   Esophageal dysphagia 10/14/2021   Acute toxic encephalopathy 10/14/2021   Hypotension 10/14/2021   Near syncope 10/13/2021   Iron deficiency anemia due to chronic blood loss 10/13/2021   Chronic respiratory failure with hypoxia (HCC) 10/13/2021   Chronic lower back pain 10/13/2021   Tobacco use disorder 10/13/2021   Pulmonary nodules/lesions, multiple 10/13/2021   Acute on chronic respiratory failure with hypoxia (HCC) 02/02/2021   COVID-19 virus infection 02/01/2021   AKI (acute kidney injury) 02/01/2021   Peripheral arterial disease 02/01/2021   Benign essential HTN 02/01/2021   COPD (chronic obstructive pulmonary disease) (HCC) 02/01/2021   Cigarette smoker 02/01/2021   Hypothyroidism 02/01/2021   Memory impairment 02/01/2021   Restless leg 02/01/2021   Chronic hyponatremia 02/01/2021   Colitis 01/29/2021   PCP:  Karlene Mungo, MD Pharmacy:   [facility] - [place], [place] - [address] [place] [address] Phone: [phone]  Fax: [phone]  [facility] - [place], [place] - [address] [place] [address] [place] [address] [place] [address] Phone: [phone] Fax: [phone]     Social Drivers of Health (SDOH) Social History: SDOH Screenings   Food Insecurity: No Food Insecurity (12/31/2023)  Housing: Low Risk (12/31/2023)  Transportation Needs: No  Transportation Needs (12/31/2023)  Utilities: Not At Risk (12/31/2023)  Social Connections: Socially Isolated (12/31/2023)  Tobacco Use: Medium Risk (12/31/2023)   SDOH Interventions:     Readmission Risk Interventions     No data to display

## 2024-06-02 NOTE — Progress Notes (Signed)
 eLink Physician-Brief Progress Note Patient Name: Anahit Klumb DOB: 12-Jan-1935 MRN: [id number]   Date of Service  06/02/2024  HPI/Events of Note  Patient with sub-optimal pain control.  eICU Interventions  IV Tylenol  ordered + one dose of iv Dilaudid  0.5 mg for immediate relief.        Cyanne Delmar U Lyal Husted 06/02/2024, 1:13 AM

## 2024-06-02 NOTE — H&P (Deleted)
 "  NAME:  Amy Mckay, MRN:  [id number], DOB:  [birth year], LOS: 1 ADMISSION DATE:  06/01/2024, CONSULTATION DATE:  06/01/2024 REFERRING MD:  Lamar Gander, MD CHIEF COMPLAINT:  rectal pain and bleeding  History of Present Illness:  89 y/o female with H/o Dementia, Mood disorder, S/p celiac stent, Anxiety, NSTEMI, Generalized muscle weakness (mostly in wheelchair, able to walk a couple steps with assistance), COPD on 3 liters home O2, HTN, GERD, Iron def Anemia and rectal prolapse who presented from [facility] health and rehab for rectal prolapse pain and found to be anemic and noted to have GI bleed.  She was also noted to be hypotensive in ED.  1 liters IVF and 1 unit PRBC for HgB 7.9.  She also was SOB on admission to ED and was treated for COPD with Solumedrol, duonebs x 2 and Albuterol  x 1.   Currently no active bleeding but has rectal and lower abd pain.  CTA GI did not show any extravasation of blood for embolism and surgery was consulted but she is not a surgical candidate and deferred to GI for evaluation and need for colonic biopsy. Per CT abd,No CT evidence of active gastrointestinal hemorrage. 2. Short segment mid transverse colon irregular wall thickening, concerning for underlying malignancy. Recommend colonoscopy. 3. Rectal prolapse with chronic associated rectal wall thickening. 4. Colonic diverticulosis with no acute diverticulitis .   Pertinent  Medical History  Dementia, Mood disorder, S/p celiac stent, Anxiety, NSTEMI, Generalized muscle weakness, COPD on 3 liters home O2, HTN, GERD, Iron def Anemia and rectal prolapse  Significant Hospital Events: Including procedures, antibiotic start and stop dates in addition to other pertinent events   06/01/2024: Admitted for possible GI bleed, hypotension   Interim History / Subjective:  Off levophed . Hgb improved significantly with 1 PRBC from 7.9 on admission to 10.2 this morning. Continues to complain of diffuse pain  everywhere especially rectal pain.   Objective    Blood pressure (!) 121/97, pulse (!) 111, temperature 98.5 F (36.9 C), temperature source Axillary, resp. rate 11, height 5' 1 (1.549 m), weight 74.1 kg, SpO2 94%.        Intake/Output Summary (Last 24 hours) at 06/02/2024 0901 Last data filed at 06/02/2024 9268 Gross per 24 hour  Intake 2674.1 ml  Output 375 ml  Net 2299.1 ml   Filed Weights   06/02/24 0050  Weight: 74.1 kg    Examination: General: NAD HENT: [place]/AT, no icterus Lungs: breathing comfortably on nasal cannula, CTAB Cardiovascular: regular rate and rhythm, normal S1 and S2, no m/r/g Abdomen: soft, non-tender, non-distended, +BS Extremities: warm, dry, trace edema Neuro: alert and oriented, answers questions appropriately GU: deferred  Resolved problem list   Hemorrhagic shock, now off levophed  and hemoglobin stable   Assessment and Plan   GI bleed Rectal prolapse Diverticulosis  Anemia, likely ABLA on chronic  Lactic acidosis, likely from hemorrhagic shock Patient endorses both melanotic stool and BRBPR for the past several months. Seen by surgery last admission and not a candidate for surgery. CTA GI bleed protocol showed no active bleed, diverticulosis, short segment of mid transverse colon with irregular wall thickening concerning for malignancy, colonic diverticulosis with no acute diverticulitis, and rectal prolapse. No further signs of bleeding, Hgb stable.  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 No localizing source of infection. - Continue to monitor CBC, fever curve- hold off on antibiotics for now  History of left basal ganglia CVA  - Holding home ASA and Brilinta  in the setting of GIB  - Continue statin  Acute on chronic pain - Resume reduced dose home oxycodone  - Resume home gabapentin , APAP and Cymbalta    Cognitive impairment  - Continue home namenda   - PT/OT eval  Discussed goals of care with Amy Mckay. She wishes to remain full code but endorses that she would not be on the ventilator for a prolonged period of time.   Labs   CBC: Recent Labs  Lab 06/01/24 1630 06/01/24 1828 06/01/24 2120 06/02/24 0524  WBC 11.6*  --  12.7* 9.6  NEUTROABS 6.0  --   --   --   HGB 7.9* 8.5* 9.5* 10.2*  HCT 27.2* 25.0* 30.6* 32.8*  MCV 92.8  --  91.9 90.6  PLT 330  --  317 353    Basic Metabolic Panel: Recent Labs  Lab 06/01/24 1630 06/01/24 1828 06/02/24 0524  NA 141 140 144  K 4.1 4.3 3.8  CL 96* 94* 96*  CO2 34*  --  32  GLUCOSE 130* 153* 128*  BUN 24* 30* 19  CREATININE 1.09* 1.20* 0.92  CALCIUM  9.3  --  9.8  MG 1.8  --  1.6*  PHOS  --   --  3.0   GFR: Estimated Creatinine Clearance: 39.6 mL/min (by C-G formula based on SCr of 0.92 mg/dL). Recent Labs  Lab 06/01/24 1630 06/01/24 1829 06/01/24 2006 06/01/24 2120 06/02/24 0524  WBC 11.6*  --   --  12.7* 9.6  LATICACIDVEN  --  3.9* 3.6*  --   --     Liver Function Tests: Recent Labs  Lab 06/01/24 1630 06/01/24 2120  AST 26  --   ALT 13  --   ALKPHOS 109  --   BILITOT <0.2 0.5  PROT 7.2  --   ALBUMIN 4.0  --    No results for input(s): LIPASE, AMYLASE in the last 168 hours. No results for input(s): AMMONIA in the last 168 hours.  ABG    Component Value Date/Time   HCO3 30.0  (H) 06/12/2023 2353   TCO2 36 (H) 06/01/2024 1828   O2SAT 57 06/12/2023 2353     Coagulation Profile: Recent Labs  Lab 06/01/24 1958  INR 0.9    Cardiac Enzymes: No results for input(s): CKTOTAL, CKMB, CKMBINDEX, TROPONINI in the last 168 hours.  HbA1C: Hgb A1c MFr Bld  Date/Time Value Ref Range Status  12/31/2023 01:27 PM 5.5 4.8 - 5.6 % Final    Comment:    (NOTE) Diagnosis of Diabetes The following HbA1c ranges recommended by the American Diabetes Association (ADA) may be used as an aid in the diagnosis of diabetes mellitus.  Hemoglobin             Suggested A1C NGSP%              Diagnosis  <5.7  Non Diabetic  5.7-6.4                Pre-Diabetic  >6.4                   Diabetic  <7.0                   Glycemic control for                       adults with diabetes.    06/13/2023 05:55 AM 5.9 (H) 4.8 - 5.6 % Final    Comment:    (NOTE)         Prediabetes: 5.7 - 6.4         Diabetes: >6.4         Glycemic control for adults with diabetes: <7.0     CBG: Recent Labs  Lab 06/02/24 0040 06/02/24 0310 06/02/24 0759  GLUCAP 149* 137* 115*   Critical care time:    Rexene LOISE Tanda DEVONNA [facility] 06/02/2024 9:39 AM  Please see [email or web address] for pager details.  From 7A-7P if no response, please call [phone] After hours, please call ELink [phone]        "

## 2024-06-03 ENCOUNTER — Encounter (HOSPITAL_COMMUNITY): Payer: Self-pay

## 2024-06-03 LAB — BASIC METABOLIC PANEL WITH GFR
Anion gap: 8 (ref 5–15)
BUN: 18 mg/dL (ref 8–23)
CO2: 34 mmol/L — ABNORMAL HIGH (ref 22–32)
Calcium: 9.2 mg/dL (ref 8.9–10.3)
Chloride: 98 mmol/L (ref 98–111)
Creatinine, Ser: 0.89 mg/dL (ref 0.44–1.00)
GFR, Estimated: >60 mL/min
Glucose, Bld: 93 mg/dL (ref 70–99)
Potassium: 4.1 mmol/L (ref 3.5–5.1)
Sodium: 140 mmol/L (ref 135–145)

## 2024-06-03 LAB — CBC
HCT: 30.1 % — ABNORMAL LOW (ref 36.0–46.0)
Hemoglobin: 9.4 g/dL — ABNORMAL LOW (ref 12.0–15.0)
MCH: 28.7 pg (ref 26.0–34.0)
MCHC: 31.2 g/dL (ref 30.0–36.0)
MCV: 91.8 fL (ref 80.0–100.0)
Platelets: 194 K/uL (ref 150–400)
RBC: 3.28 MIL/uL — ABNORMAL LOW (ref 3.87–5.11)
RDW: 15.1 % (ref 11.5–15.5)
WBC: 12.6 K/uL — ABNORMAL HIGH (ref 4.0–10.5)
nRBC: 0 % (ref 0.0–0.2)

## 2024-06-03 LAB — GLUCOSE, CAPILLARY
Glucose-Capillary: 97 mg/dL (ref 70–99)
Glucose-Capillary: 94 mg/dL (ref 70–99)
Glucose-Capillary: 136 mg/dL — ABNORMAL HIGH (ref 70–99)
Glucose-Capillary: 86 mg/dL (ref 70–99)
Glucose-Capillary: 141 mg/dL — ABNORMAL HIGH (ref 70–99)
Glucose-Capillary: 99 mg/dL (ref 70–99)

## 2024-06-03 LAB — MAGNESIUM: Magnesium: 2.3 mg/dL (ref 1.7–2.4)

## 2024-06-03 LAB — HAPTOGLOBIN: Haptoglobin: 234 mg/dL (ref 41–333)

## 2024-06-03 LAB — PHOSPHORUS: Phosphorus: 3.5 mg/dL (ref 2.5–4.6)

## 2024-06-03 MED ORDER — METOPROLOL SUCCINATE ER 25 MG PO TB24
25.0000 mg | ORAL_TABLET | Freq: Every day | ORAL | Status: DC
  Administered 2024-06-04 – 2024-06-05 (×2): 25 mg via ORAL
  Filled 2024-06-03 (×2): qty 1

## 2024-06-03 MED ORDER — LACTATED RINGERS IV SOLN: INTRAVENOUS | Status: AC

## 2024-06-03 NOTE — Consult Note (Signed)
 Reason for Consult: Abnormal CT Referring Physician: ICU team  Amy Mckay is an 89 y.o. female.  HPI: Patient seen and examined and her hospital computer chart and her office computer chart were reviewed and her case discussed with the ICU team and she looks much better than her story sounds but supposedly she is not a surgical candidate for her rectal prolapse which brings up the question if she is a surgical candidate for a transverse colon cancer and she has had lower GI bleeding off and on for years and her rectal prolapse is her main complaint and that bothers her the most and she has alternating diarrhea and constipation as well as periodic blood and mucus and she really does not want another colonoscopy and her case discussed with her nurse as well and she has not moved her bowels today and we discussed her family situation and she has no other complaints  Past Medical History:  Diagnosis Date   Acid reflux    COPD (chronic obstructive pulmonary disease) (HCC)    GERD (gastroesophageal reflux disease)    Hypertension     Past Surgical History:  Procedure Laterality Date   BIOPSY  10/17/2021   Procedure: BIOPSY;  Surgeon: Dianna Specking, MD;  Location: [facility];  Service: Gastroenterology;;   BIOPSY  01/29/2023   Procedure: BIOPSY;  Surgeon: Dianna Specking, MD;  Location: [facility];  Service: Gastroenterology;;   COLONOSCOPY N/A 10/17/2021   Procedure: COLONOSCOPY;  Surgeon: Dianna Specking, MD;  Location: [facility];  Service: Gastroenterology;  Laterality: N/A;   COLONOSCOPY     COLONOSCOPY WITH PROPOFOL  N/A 10/18/2021   Procedure: COLONOSCOPY WITH PROPOFOL ;  Surgeon: Elicia Claw, MD;  Location: [facility];  Service: Gastroenterology;  Laterality: N/A;   ESOPHAGOGASTRODUODENOSCOPY N/A 10/17/2021   Procedure: ESOPHAGOGASTRODUODENOSCOPY (EGD);  Surgeon: Dianna Specking, MD;  Location: [facility];  Service: Gastroenterology;  Laterality: N/A;    ESOPHAGOGASTRODUODENOSCOPY     FLEXIBLE SIGMOIDOSCOPY N/A 01/29/2023   Procedure: FLEXIBLE SIGMOIDOSCOPY;  Surgeon: Dianna Specking, MD;  [facility];  Service: Gastroenterology;  Laterality: N/A;   POLYPECTOMY  10/18/2021   Procedure: POLYPECTOMY;  Surgeon: Elicia Claw, MD;  Location: [facility];  Service: Gastroenterology;;    History reviewed. No pertinent family history.  Social History:  reports that she has quit smoking. Her smoking use included cigarettes. She has never used smokeless tobacco. She reports that she does not drink alcohol and does not use drugs.  Allergies: Allergies[1]  Medications: I have reviewed the patient's current medications.  Results for orders placed or performed during the hospital encounter of 06/01/24 (from the past 48 hours)  Resp panel by RT-PCR (RSV, Flu A&B, Covid) Anterior Nasal Swab     Status: None   Collection Time: 06/01/24  4:20 PM   Specimen: Anterior Nasal Swab  Result Value Ref Range   SARS Coronavirus 2 by RT PCR NEGATIVE NEGATIVE   Influenza A by PCR NEGATIVE NEGATIVE   Influenza B by PCR NEGATIVE NEGATIVE    Comment: (NOTE) The Xpert Xpress SARS-CoV-2/FLU/RSV plus assay is intended as an aid in the diagnosis of influenza from Nasopharyngeal swab specimens and should not be used as a sole basis for treatment. Nasal washings and aspirates are unacceptable for Xpert Xpress SARS-CoV-2/FLU/RSV testing.  Fact Sheet for Patients: bloggercourse.com  Fact Sheet for Healthcare Providers: seriousbroker.it  This test is not yet approved or cleared by the [place]  FDA and has been authorized for detection and/or diagnosis of SARS-CoV-2 by FDA under  an Emergency Use Authorization (EUA). This EUA will remain in effect (meaning this test can be used) for the duration of the COVID-19 declaration under Section 564(b)(1) of the Act, 21 U.S.C. section 360bbb-3(b)(1), unless  the authorization is terminated or revoked.     Resp Syncytial Virus by PCR NEGATIVE NEGATIVE    Comment: (NOTE) Fact Sheet for Patients: bloggercourse.com  Fact Sheet for Healthcare Providers: seriousbroker.it  This test is not yet approved or cleared by the [place]  FDA and has been authorized for detection and/or diagnosis of SARS-CoV-2 by FDA under an Emergency Use Authorization (EUA). This EUA will remain in effect (meaning this test can be used) for the duration of the COVID-19 declaration under Section 564(b)(1) of the Act, 21 U.S.C. section 360bbb-3(b)(1), unless the authorization is terminated or revoked.  Performed at [facility], [address], [place], [place] [address]   Magnesium      Status: None   Collection Time: 06/01/24  4:30 PM  Result Value Ref Range   Magnesium  1.8 1.7 - 2.4 mg/dL    Comment: Performed at [facility], [address], [place], [place] [address]  CBC with Differential     Status: Abnormal   Collection Time: 06/01/24  4:30 PM  Result Value Ref Range   WBC 11.6 (H) 4.0 - 10.5 K/uL   RBC 2.93 (L) 3.87 - 5.11 MIL/uL   Hemoglobin 7.9 (L) 12.0 - 15.0 g/dL   HCT 72.7 (L) 63.9 - 53.9 %   MCV 92.8 80.0 - 100.0 fL   MCH 27.0 26.0 - 34.0 pg   MCHC 29.0 (L) 30.0 - 36.0 g/dL   RDW 85.1 88.4 - 84.4 %   Platelets 330 150 - 400 K/uL   nRBC 0.2 0.0 - 0.2 %   Neutrophils Relative % 51 %   Neutro Abs 6.0 1.7 - 7.7 K/uL   Lymphocytes Relative 31 %   Lymphs Abs 3.6 0.7 - 4.0 K/uL   Monocytes Relative 12 %   Monocytes Absolute 1.3 (H) 0.1 - 1.0 K/uL   Eosinophils Relative 5 %   Eosinophils Absolute 0.5 0.0 - 0.5 K/uL   Basophils Relative 0 %   Basophils Absolute 0.0 0.0 - 0.1 K/uL   Immature Granulocytes 1 %   Abs Immature Granulocytes 0.08 (H) 0.00 - 0.07 K/uL    Comment: Performed at [facility], [address] [place], [place] [address]  Comprehensive metabolic panel      Status: Abnormal   Collection Time: 06/01/24  4:30 PM  Result Value Ref Range   Sodium 141 135 - 145 mmol/L   Potassium 4.1 3.5 - 5.1 mmol/L   Chloride 96 (L) 98 - 111 mmol/L   CO2 34 (H) 22 - 32 mmol/L   Glucose, Bld 130 (H) 70 - 99 mg/dL    Comment: Glucose reference range applies only to samples taken after fasting for at least 8 hours.   BUN 24 (H) 8 - 23 mg/dL   Creatinine, Ser 8.90 (H) 0.44 - 1.00 mg/dL   Calcium  9.3 8.9 - 10.3 mg/dL   Total Protein 7.2 6.5 - 8.1 g/dL   Albumin 4.0 3.5 - 5.0 g/dL   AST 26 15 - 41 U/L   ALT 13 0 - 44 U/L   Alkaline Phosphatase 109 38 - 126 U/L   Total Bilirubin <0.2 0.0 - 1.2 mg/dL   GFR, Estimated 49 (L) >60 mL/min    Comment: (NOTE) Calculated using the CKD-EPI  Creatinine Equation (2021)    Anion gap 12 5 - 15    Comment: Performed at [facility], [address], [place], [place] [address]  Pro Brain natriuretic peptide     Status: None   Collection Time: 06/01/24  4:30 PM  Result Value Ref Range   Pro Brain Natriuretic Peptide 217.0 <300.0 pg/mL    Comment: (NOTE) Age Group        Cut-Points    Interpretation  < 50 years     450 pg/mL       NT-proBNP > 450 pg/mL indicates                                ADHF is likely              50 to 75 years  900 pg/mL      NT-proBNP > 900 pg/mL indicates          ADHF is likely  > 75 years      1800 pg/mL     NT-proBNP > 1800 pg/mL indicates          ADHF is likely                           All ages    Results between       Indeterminate. Further clinical             300 and the cut-   information is needed to determine            point for age group   if ADHF is present.                                                             Elecsys proBNP II/ Elecsys proBNP II STAT           Cut-Point                       Interpretation  300 pg/mL                    NT-proBNP <300pg/mL indicates                             ADHF is not likely  Performed at [facility], [address] [place], [place] [address]   Type and screen [facility]     Status: None   Collection Time: 06/01/24  5:35 PM  Result Value Ref Range   ABO/RH(D) O NEG    Antibody Screen POS    Sample Expiration 06/04/2024,2359    Antibody Identification ANTI D    Unit Number [id number]    Blood Component Type LOW TITER WHOLE BLOOD    Unit division 00    Status of Unit ISSUED,FINAL    Transfusion Status OK TO TRANSFUSE    Crossmatch Result INCOMPATIBLE    Unit tag comment      [facility] Performed at [facility], [address] [place], [place] [address]   Urinalysis, Routine w reflex microscopic -Urine, Clean  Catch     Status: Abnormal   Collection Time: 06/01/24  5:52 PM  Result Value Ref Range   Color, Urine STRAW (A) YELLOW   APPearance CLEAR CLEAR   Specific Gravity, Urine 1.006 1.005 - 1.030   pH 7.0 5.0 - 8.0   Glucose, UA NEGATIVE NEGATIVE mg/dL   Hgb urine dipstick SMALL (A) NEGATIVE   Bilirubin Urine NEGATIVE NEGATIVE   Ketones, ur NEGATIVE NEGATIVE mg/dL   Protein, ur 30 (A) NEGATIVE mg/dL   Nitrite NEGATIVE NEGATIVE   Leukocytes,Ua MODERATE (A) NEGATIVE   RBC / HPF 0-5 0 - 5 RBC/hpf   WBC, UA 6-10 0 - 5 WBC/hpf   Bacteria, UA NONE SEEN NONE SEEN   Squamous Epithelial / HPF 0-5 0 - 5 /HPF   Mucus PRESENT    Non Squamous Epithelial 0-5 (A) NONE SEEN    Comment: Performed at [facility], [address] [place], [place] [address]  I-stat chem 8, ED (not at [facility], [facility] or [facility])     Status: Abnormal   Collection Time: 06/01/24  6:28 PM  Result Value Ref Range   Sodium 140 135 - 145 mmol/L   Potassium 4.3 3.5 - 5.1 mmol/L   Chloride 94 (L) 98 - 111 mmol/L   BUN 30 (H) 8 - 23 mg/dL   Creatinine, Ser 8.79 (H) 0.44 - 1.00 mg/dL   Glucose, Bld 846 (H) 70 - 99 mg/dL    Comment: Glucose reference range applies only to samples taken after fasting for at least 8 hours.   Calcium , Ion 1.06 (L) 1.15 - 1.40 mmol/L   TCO2 36 (H) 22 - 32  mmol/L   Hemoglobin 8.5 (L) 12.0 - 15.0 g/dL   HCT 74.9 (L) 63.9 - 53.9 %  I-Stat CG4 Lactic Acid     Status: Abnormal   Collection Time: 06/01/24  6:29 PM  Result Value Ref Range   Lactic Acid, Venous 3.9 (HH) 0.5 - 1.9 mmol/L   Comment NOTIFIED PHYSICIAN   Protime-INR     Status: None   Collection Time: 06/01/24  7:58 PM  Result Value Ref Range   Prothrombin Time 13.2 11.4 - 15.2 seconds   INR 0.9 0.8 - 1.2    Comment: (NOTE) INR goal varies based on device and disease states. Performed at [facility], [address] [place], [place] [address]   APTT     Status: None   Collection Time: 06/01/24  7:58 PM  Result Value Ref Range   aPTT 28 24 - 36 seconds    Comment: Performed at [facility], [address] [place], [place] [address]  I-Stat CG4 Lactic Acid     Status: Abnormal   Collection Time: 06/01/24  8:06 PM  Result Value Ref Range   Lactic Acid, Venous 3.6 (HH) 0.5 - 1.9 mmol/L   Comment NOTIFIED PHYSICIAN   Bilirubin, total     Status: None   Collection Time: 06/01/24  9:20 PM  Result Value Ref Range   Total Bilirubin 0.5 0.0 - 1.2 mg/dL    Comment: Performed at [facility], [address], [place], [place] [address]  Lactate dehydrogenase     Status: Abnormal   Collection Time: 06/01/24  9:20 PM  Result Value Ref Range   LDH 350 (H) 105 - 235 U/L    Comment: Performed at [facility], [address], [place], [place] [address]  CBC  Status: Abnormal   Collection Time: 06/01/24  9:20 PM  Result Value Ref Range   WBC 12.7 (H) 4.0 - 10.5 K/uL   RBC 3.33 (L) 3.87 - 5.11 MIL/uL   Hemoglobin 9.5 (L) 12.0 - 15.0 g/dL   HCT 69.3 (L) 63.9 - 53.9 %   MCV 91.9 80.0 - 100.0 fL   MCH 28.5 26.0 - 34.0 pg   MCHC 31.0 30.0 - 36.0 g/dL   RDW 85.5 88.4 - 84.4 %   Platelets 317 150 - 400 K/uL   nRBC 0.2 0.0 - 0.2 %    Comment: Performed at [facility], [address] [place], [place] [address]  Technologist smear review     Status: None    Collection Time: 06/01/24  9:30 PM  Result Value Ref Range   WBC MORPHOLOGY MORPHOLOGY UNREMARKABLE    RBC MORPHOLOGY MORPHOLOGY UNREMARKABLE    Plt Morphology Normal platelet morphology    Clinical Information c/f hemolysis     Comment: Performed at [facility], [address] [place], [place] [address]  Troponin T, High Sensitivity     Status: Abnormal   Collection Time: 06/01/24 11:38 PM  Result Value Ref Range   Troponin T High Sensitivity 27 (H) 0 - 19 ng/L    Comment: (NOTE) Biotin concentrations > 1000 ng/mL falsely decrease TnT results.  Serial cardiac troponin measurements are suggested.  Refer to the Links section for chest pain algorithms and additional  guidance. Performed at [facility], [address], [place], [place] [address]   Glucose, capillary     Status: Abnormal   Collection Time: 06/02/24 12:40 AM  Result Value Ref Range   Glucose-Capillary 149 (H) 70 - 99 mg/dL    Comment: Glucose reference range applies only to samples taken after fasting for at least 8 hours.  MRSA Next Gen by PCR, Nasal     Status: None   Collection Time: 06/02/24  2:00 AM   Specimen: Nasal Mucosa; Nasal Swab  Result Value Ref Range   MRSA by PCR Next Gen NOT DETECTED NOT DETECTED    Comment: (NOTE) The GeneXpert MRSA Assay (FDA approved for NASAL specimens only), is one component of a comprehensive MRSA colonization surveillance program. It is not intended to diagnose MRSA infection nor to guide or monitor treatment for MRSA infections. Test performance is not FDA approved in patients less than 49 years old. Performed at [facility], [address] [place], [place] [address]   Glucose, capillary     Status: Abnormal   Collection Time: 06/02/24  3:10 AM  Result Value Ref Range   Glucose-Capillary 137 (H) 70 - 99 mg/dL    Comment: Glucose reference range applies only to samples taken after fasting for at least 8 hours.  CBC     Status: Abnormal    Collection Time: 06/02/24  5:24 AM  Result Value Ref Range   WBC 9.6 4.0 - 10.5 K/uL   RBC 3.62 (L) 3.87 - 5.11 MIL/uL   Hemoglobin 10.2 (L) 12.0 - 15.0 g/dL   HCT 67.1 (L) 63.9 - 53.9 %   MCV 90.6 80.0 - 100.0 fL   MCH 28.2 26.0 - 34.0 pg   MCHC 31.1 30.0 - 36.0 g/dL   RDW 85.6 88.4 - 84.4 %   Platelets 353 150 - 400 K/uL   nRBC 0.3 (H) 0.0 - 0.2 %    Comment: Performed at [facility], [address] [place], [place] [address]  Basic metabolic panel     Status: Abnormal   Collection Time: 06/02/24  5:24 AM  Result Value Ref Range   Sodium 144 135 - 145 mmol/L   Potassium 3.8 3.5 - 5.1 mmol/L   Chloride 96 (L) 98 - 111 mmol/L   CO2 32 22 - 32 mmol/L   Glucose, Bld 128 (H) 70 - 99 mg/dL    Comment: Glucose reference range applies only to samples taken after fasting for at least 8 hours.   BUN 19 8 - 23 mg/dL   Creatinine, Ser 9.07 0.44 - 1.00 mg/dL   Calcium  9.8 8.9 - 10.3 mg/dL   GFR, Estimated >39 >39 mL/min    Comment: (NOTE) Calculated using the CKD-EPI Creatinine Equation (2021)    Anion gap 16 (H) 5 - 15    Comment: Performed at [facility], [address] [place], [place] [address]  Magnesium      Status: Abnormal   Collection Time: 06/02/24  5:24 AM  Result Value Ref Range   Magnesium  1.6 (L) 1.7 - 2.4 mg/dL    Comment: Performed at [facility], [address] [place], [place] [address]  Phosphorus     Status: None   Collection Time: 06/02/24  5:24 AM  Result Value Ref Range   Phosphorus 3.0 2.5 - 4.6 mg/dL    Comment: Performed at [facility], [address], [place], [place] [address]  Troponin T, High Sensitivity     Status: Abnormal   Collection Time: 06/02/24  5:24 AM  Result Value Ref Range   Troponin T High Sensitivity 30 (H) 0 - 19 ng/L    Comment: (NOTE) Biotin concentrations > 1000 ng/mL falsely decrease TnT results.  Serial cardiac troponin measurements are suggested.  Refer to the Links section for chest pain algorithms  and additional  guidance. Performed at [facility], [address], [place], [place] [address]   Glucose, capillary     Status: Abnormal   Collection Time: 06/02/24  7:59 AM  Result Value Ref Range   Glucose-Capillary 115 (H) 70 - 99 mg/dL    Comment: Glucose reference range applies only to samples taken after fasting for at least 8 hours.  CBC     Status: Abnormal   Collection Time: 06/02/24  9:55 AM  Result Value Ref Range   WBC 11.5 (H) 4.0 - 10.5 K/uL   RBC 3.49 (L) 3.87 - 5.11 MIL/uL   Hemoglobin 9.8 (L) 12.0 - 15.0 g/dL   HCT 68.4 (L) 63.9 - 53.9 %   MCV 90.3 80.0 - 100.0 fL   MCH 28.1 26.0 - 34.0 pg   MCHC 31.1 30.0 - 36.0 g/dL   RDW 85.3 88.4 - 84.4 %   Platelets 327 150 - 400 K/uL   nRBC 0.2 0.0 - 0.2 %    Comment: Performed at [facility], [address], [place], [place] [address]  Vitamin B12     Status: Abnormal   Collection Time: 06/02/24  9:56 AM  Result Value Ref Range   Vitamin B-12 1,124 (H) 180 - 914 pg/mL    Comment: Performed at [facility], [address], [place], [place] [address]  Folate     Status: None   Collection Time: 06/02/24  9:56 AM  Result Value Ref Range   Folate 14.3 >5.9 ng/mL    Comment: Performed at [facility], [address] [place], [place] [address]  Iron and TIBC     Status: Abnormal   Collection Time: 06/02/24  9:56 AM  Result Value Ref Range   Iron 20 (L) 28 - 170 ug/dL   TIBC 544 (H) 250 - 450 ug/dL   Saturation Ratios 4 (L) 10.4 - 31.8 %   UIBC 435 ug/dL    Comment: Performed at [facility], [address], [place], [place] [address]  Ferritin     Status: None   Collection Time: 06/02/24  9:56 AM  Result Value Ref Range   Ferritin 29 11 - 307 ng/mL    Comment: Performed at [facility], [address] [place], [place] [address]  Reticulocytes     Status: Abnormal   Collection Time: 06/02/24  9:56 AM  Result Value Ref Range   Retic Ct Pct 1.6 0.4 - 3.1 %   RBC. 3.42 (L) 3.87 -  5.11 MIL/uL   Retic Count, Absolute 55.4 19.0 - 186.0 K/uL   Immature Retic Fract 39.4 (H) 2.3 - 15.9 %    Comment: Performed at [facility], [address] [place], [place] [address]  Lactic acid, plasma     Status: None   Collection Time: 06/02/24 10:42 AM  Result Value Ref Range   Lactic Acid, Venous 1.3 0.5 - 1.9 mmol/L    Comment: Performed at [facility], [address], [place], [place] [address]  Glucose, capillary     Status: Abnormal   Collection Time: 06/02/24 11:45 AM  Result Value Ref Range   Glucose-Capillary 120 (H) 70 - 99 mg/dL    Comment: Glucose reference range applies only to samples taken after fasting for at least 8 hours.  CBC     Status: Abnormal   Collection Time: 06/02/24  3:32 PM  Result Value Ref Range   WBC 13.2 (H) 4.0 - 10.5 K/uL   RBC 3.21 (L) 3.87 - 5.11 MIL/uL   Hemoglobin 9.2 (L) 12.0 - 15.0 g/dL   HCT 70.6 (L) 63.9 - 53.9 %   MCV 91.3 80.0 - 100.0 fL   MCH 28.7 26.0 - 34.0 pg   MCHC 31.4 30.0 - 36.0 g/dL   RDW 85.2 88.4 - 84.4 %   Platelets 319 150 - 400 K/uL   nRBC 0.0 0.0 - 0.2 %    Comment: Performed at [facility], [address], [place], [place] [address]  Glucose, capillary     Status: Abnormal   Collection Time: 06/02/24  4:04 PM  Result Value Ref Range   Glucose-Capillary 102 (H) 70 - 99 mg/dL    Comment: Glucose reference range applies only to samples taken after fasting for at least 8 hours.  Glucose, capillary     Status: None   Collection Time: 06/02/24  7:28 PM  Result Value Ref Range   Glucose-Capillary 92 70 - 99 mg/dL    Comment: Glucose reference range applies only to samples taken after fasting for at least 8 hours.  Glucose, capillary     Status: Abnormal   Collection Time: 06/02/24 11:15 PM  Result Value Ref Range   Glucose-Capillary 100 (H) 70 - 99 mg/dL    Comment: Glucose reference range applies only to samples taken after fasting for at least 8 hours.  Glucose, capillary     Status: None    Collection Time: 06/03/24  3:20 AM  Result Value Ref Range   Glucose-Capillary 97 70 -  99 mg/dL    Comment: Glucose reference range applies only to samples taken after fasting for at least 8 hours.  CBC     Status: Abnormal   Collection Time: 06/03/24  6:57 AM  Result Value Ref Range   WBC 12.6 (H) 4.0 - 10.5 K/uL   RBC 3.28 (L) 3.87 - 5.11 MIL/uL   Hemoglobin 9.4 (L) 12.0 - 15.0 g/dL   HCT 69.8 (L) 63.9 - 53.9 %   MCV 91.8 80.0 - 100.0 fL   MCH 28.7 26.0 - 34.0 pg   MCHC 31.2 30.0 - 36.0 g/dL   RDW 84.8 88.4 - 84.4 %   Platelets 194 150 - 400 K/uL   nRBC 0.0 0.0 - 0.2 %    Comment: Performed at [facility], [address], [place], [place] [address]  Magnesium      Status: None   Collection Time: 06/03/24  6:57 AM  Result Value Ref Range   Magnesium  2.3 1.7 - 2.4 mg/dL    Comment: Performed at [facility], [address] [place], [place] [address]  Phosphorus     Status: None   Collection Time: 06/03/24  6:57 AM  Result Value Ref Range   Phosphorus 3.5 2.5 - 4.6 mg/dL    Comment: Performed at [facility], [address], [place], [place] [address]  Glucose, capillary     Status: None   Collection Time: 06/03/24  7:34 AM  Result Value Ref Range   Glucose-Capillary 94 70 - 99 mg/dL    Comment: Glucose reference range applies only to samples taken after fasting for at least 8 hours.    CT ANGIO GI BLEED Result Date: 06/01/2024 EXAM: CTA ABDOMEN AND PELVIS WITH CONTRAST 06/01/2024 06:52:00 PM TECHNIQUE: CTA images of the abdomen and pelvis with intravenous contrast. 85 mL (iohexol  (OMNIPAQUE ) 350 MG/ML injection 85 mL IOHEXOL  350 MG/ML SOLN) was administered. Three-dimensional MIP/volume rendered formations were performed. Automated exposure control, iterative reconstruction, and/or weight based adjustment of the mA/kV was utilized to reduce the radiation dose to as low as reasonably achievable. COMPARISON: CT abdomen and pelvis 06/15/2023. CLINICAL HISTORY:  drop in hgb FINDINGS: VASCULATURE: GI BLEED: No active extravasation of contrast within the GI tract. No excretion of intravenous contrast within the lumen of the small or large bowel. AORTA: Severe atherosclerotic plaque in the aorta and its main branches. Tortuous abdominal aorta. No acute finding. No abdominal aortic aneurysm. No dissection. CELIAC TRUNK: Atherosclerotic plaque.Celiac origin stent patent. No acute finding. No occlusion or significant stenosis. SUPERIOR MESENTERIC ARTERY: Atherosclerotic plaque. At least moderate narrowing of the origin of the superior mesenteric artery. 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 RENAL ARTERIES: Atherosclerotic plaque. At least moderate narrowing of the origin of the renal arteries. ILIAC ARTERIES: Severe atherosclerotic plaque of the iliac arteries. FEMORAL ARTERIES: Mild-to-moderate atherosclerotic plaque partially visualized in diminutive femoral arteries. ABDOMEN/PELVIS: LOWER CHEST: Coronary artery calcifications. Subpleural triangular pulmonary microdnoules along the right fissure consistent with an intrapulmonary lymph node - note further follow-up indicated. LIVER: The liver is unremarkable. GALLBLADDER AND BILE DUCTS: Gallbladder is unremarkable. No biliary ductal dilatation. SPLEEN: The spleen is unremarkable. PANCREAS: The pancreas is unremarkable. ADRENAL GLANDS: Bilateral adrenal glands demonstrate no acute abnormality. KIDNEYS, URETERS AND BLADDER: No stones in the kidneys or ureters. No hydronephrosis. No perinephric or periureteral stranding. Urinary bladder is unremarkable. GI AND BOWEL: Tiny hiatal hernia. Stomach and duodenal sweep demonstrate no acute abnormality.  No small bowel thickening or dilatation. Poorly evaluated short segment mid transverse colon irregular bowel wall thickening concerning for underlying malignancy (9:78). This finding does not appear to represent  peristalsis given similar appearance on the noncontrast, venous, arterial images. The appendix is not definitely identified with no inflammatory changes in the right lower quadrant to suggest acute appendicitis. Colonic diverticulosis. Pelvic floor laxity with rectal prolapse and associated chronic rectal wall thickening. There is no bowel obstruction. No abnormal bowel wall thickening or distension. REPRODUCTIVE: Reproductive organs are unremarkable. PERITONEUM AND RETROPERITONEUM: No ascites or free air. LYMPH NODES: No lymphadenopathy. BONES AND SOFT TISSUES: No acute abnormality of the bones. No acute soft tissue abnormality. IMPRESSION: 1. No CT evidence of active gastrointestinal hemorrage. 2. Short segment mid transverse colon irregular wall thickening, concerning for underlying malignancy. Recommend colonoscopy. 3. Rectal prolapse with chronic associated rectal wall thickening. 4. Colonic diverticulosis with no acute diverticulitis . 5. Severe atherosclerotic plaque in the aorta and its main branches, with at least moderate narrowing of the origin of the superior mesenteric artery. 6. Other, non-acute and/or normal findings as above. 7. Severe atherosclerotic plaque . Patient celiac stent. Electronically signed by: Morgane Naveau MD 06/01/2024 07:13 PM EST RP Workstation: [id number]   DG Chest Portable 1 View Result Date: 06/01/2024 CLINICAL DATA:  Shortness of breath. EXAM: PORTABLE CHEST 1 VIEW COMPARISON:  Chest radiograph dated 12/31/2023. FINDINGS: No focal consolidation, pleural effusion, or pneumothorax. The cardiac silhouette is within normal limits. Atherosclerotic calcification of the aorta. No acute osseous pathology. IMPRESSION: No active disease. Electronically Signed   By: Vanetta Chou M.D.   On: 06/01/2024 17:31    ROS negative except above Blood pressure 110/62, pulse 98, temperature 97.8 F (36.6 C), temperature source Oral, resp. rate 11, height 5' 1 (1.549 m), weight 73.2 kg,  SpO2 94%. Physical Exam Vital signs stable afebrile no acute distress seemingly answering questions appropriately abdomen is soft nontender hemoglobin stable CT reviewed Assessment/Plan: Abnormal CT and patient with multiple medical problems Plan: Will leave colonoscopy to the team next week for them to see whether they are willing to proceed or not and in the meantime it would be interesting to see if she would be a colon cancer surgical candidate assuming she has sent and patient may be better served with a Cologuard test and if positive nothing further unless she is a surgical candidate and I do not know whether the hospital can do a Cologuard test or one of our nurses in our office can set up on Monday and in the meantime I will get a CEA level and please call me this weekend if I could be of any further assistance otherwise my rounding partner Dr. Saintclair will see on Monday and since she would be the one doing it next week we will leave further workup and plans to her but continue clear liquids and current dose of MiraLAX  to assist with prep if we are going to proceed to early next week  [facility] E 06/03/2024, 8:52 AM         [1]  Allergies Allergen Reactions   Penicillins Anaphylaxis, Swelling and Other (See Comments)   Penicillin G Hives   Penicillins Other (See Comments)    Tolerated zosyn  12/31/2023

## 2024-06-03 NOTE — Evaluation (Signed)
 Physical Therapy Brief Evaluation and Discharge Note Patient Details Name: Amy Mckay MRN: [id number] DOB: 1934/12/11 Today's Date: 06/03/2024   History of Present Illness  89 y.o. F adm 06/01/24 from [facility] with rectal prolapse, anemia, GIB. PMHx: COPD on 3L, chronic anemia, GERD, HTN, PAD, hypothyroidism, dementia, mood disorder  Clinical Impression  Pt pleasant and reports using 3L at all times for over a year and walking pushing a WC. Pt able to transition supine<>sitting, walking in the hall and perform standing transfers with supervision for lines. Pt at baseline functional level without further acute therapy needs at this time, will sign off with plan for return to LTC.        PT Assessment Patient does not need any further PT services  Assistance Needed at Discharge  Intermittent Supervision/Assistance    Equipment Recommendations None recommended by PT  Recommendations for Other Services       Precautions/Restrictions Precautions Precautions: Fall;Other (comment) Recall of Precautions/Restrictions: Intact Precaution/Restrictions Comments: O2 at baseline        Mobility  Bed Mobility   Supine/Sidelying to sit: Supervision Sit to supine/sidelying: Supervision General bed mobility comments: assist for line management without physical assist  Transfers Overall transfer level: Needs assistance   Transfers: Sit to/from Stand, Bed to chair/wheelchair/BSC Sit to Stand: Contact guard assist Stand pivot transfers: Contact guard assist         General transfer comment: CGA to rise from bed and chair as well as pivot bed<>chair, pt unable to tolerate chair due to rectal pain    Ambulation/Gait Ambulation/Gait assistance: Contact guard assist Gait Distance (Feet): 150 Feet Assistive device: Rolling walker (2 wheels) Gait Pattern/deviations: Step-through pattern, Decreased stride length, Trunk flexed Gait Speed: Pace WFL General Gait  Details: cues for direction, assist for line  Home Activity Instructions    Stairs            Modified Rankin (Stroke Patients Only)        Balance Overall balance assessment: Needs assistance   Sitting balance-Leahy Scale: Fair     Standing balance support: Bilateral upper extremity supported, Reliant on assistive device for balance, During functional activity Standing balance-Leahy Scale: Poor Standing balance comment: RW in standing          Pertinent Vitals/Pain PT - Brief Vital Signs All Vital Signs Stable: Yes Pain Assessment Pain Assessment: 0-10 Pain Score: 6  Pain Location: rectum with sitting Pain Descriptors / Indicators: Constant Pain Intervention(s): Limited activity within patient's tolerance, Monitored during session, Repositioned     Home Living Family/patient expects to be discharged to:: Skilled nursing facility                  Prior Function Level of Independence: Independent with assistive device(s) Comments: pt reports she perform ADLs, pushes a WC for gait to keep all her stuff with her, walks to dining hall, staff perform IADLs    UE/LE Assessment   UE ROM/Strength/Tone/Coordination: Generalized weakness    LE ROM/Strength/Tone/Coordination: Generalized weakness      Communication   Communication Communication: No apparent difficulties     Cognition Overall Cognitive Status: History of cognitive impairments - at baseline Comments: no family present to confirm baseline     General Comments      Exercises     Assessment/Plan    PT Problem List         PT Visit Diagnosis Other abnormalities of gait and mobility (R26.89)    No Skilled PT  All education completed;Patient at baseline level of functioning;Patient will have necessary level of assist by caregiver at discharge   Co-evaluation                AMPAC 6 Clicks Help needed turning from your back to your side while in a flat bed without using  bedrails?: None Help needed moving from lying on your back to sitting on the side of a flat bed without using bedrails?: None Help needed moving to and from a bed to a chair (including a wheelchair)?: A Little Help needed standing up from a chair using your arms (e.g., wheelchair or bedside chair)?: A Little Help needed to walk in hospital room?: A Little Help needed climbing 3-5 steps with a railing? : A Lot 6 Click Score: 19      End of Session Equipment Utilized During Treatment: Gait belt;Oxygen Activity Tolerance: Patient tolerated treatment well Patient left: in bed;with call bell/phone within reach;with bed alarm set Nurse Communication: Mobility status PT Visit Diagnosis: Other abnormalities of gait and mobility (R26.89)     Time: 9058-8997 PT Time Calculation (min) (ACUTE ONLY): 21 min  Charges:   PT Evaluation $PT Eval Low Complexity: 1 Low      Amy Mckay, PT Acute Rehabilitation Services Office: [phone]   Amy Mckay  06/03/2024, 11:03 AM

## 2024-06-03 NOTE — Progress Notes (Signed)
 " PROGRESS NOTE    Amy Mckay  FMW:[id number] DOB: 01-12-1935 DOA: 06/01/2024 PCP: Karlene Mungo, MD    Brief Narrative:  89 year old female with PMHx of COPD, chronic hypoxic respite failure on 3 L O2, chronic rectal prolapse, chronic anemia, hypothyroidism, PVD, mesenteric ischemia s/p SMA stenting, esophageal dysphagia, CVA (10/2022), wheelchair bound, who presented to the ED from [facility] on 06/01/2024 for rectal prolapse pain. Patient reported sever pain with recurrent episode of rectal prolapse prompting her to call EMS. Per EMS, patient was with increased work of breathing with SpO2 in the 80s, for which they administered 3 DuoNebs and 125 mg Solu-Medrol , and placed patient on NRB with SpO2 in the high 80s.    In the ED, initial vitals were a temp of 1 7.7 F, RR 14, HR 106, BP 100/57, SpO2 100% on 3 L [place]. EKG showed sinus tachycardia. CXR showed no acute abnormalities. She was noted to have rectal prolapse just behind the anal verge that was manually reduced at bedside with no gross blood per rectum noted. Labs were notable for mild leukocytosis to 11.6, acute drop in hemoglobin 7.9 from ~14 five months prior, mild elevation in bicarb to 34, BUN 24, creatinine 1.09, proBNP 217, UA showed 30 protein, moderate leukocytes, 6-10 WBC, no bacteria. Subsequently, the patient's BP dropped to 76/59 (MAP 66) and was given 1 L LR bolus the hypotension persisted and she was started on Levophed  and transfused 1 unit of whole blood. Lactic acid initially elevated 3.9 and improved to 3.6.  CTA GI bleed showed no active GI bleed, but short segment irregular mid transverse colon wall thickening concerning for malignancy (colonoscopy recommended), plus rectal prolapse/diverticulosis and severe aortomesenteric atherosclerosis noting patent celiac stent with moderate narrowing of SMA origin.  General surgery was contacted by EDP given CTA findings, recommended undergoing colonoscopy with GI prior to  surgical intervention. Patient received IV protonix , IV fentanyl , and an additional 500 mL LR bolus and ICU was consulted for admission.  Assessment and Plan:  # Concern for colon cancer - CTA abdomen showed findings concerning for mid transverse colon malignancy - GI consulted, indicating patient may need colonoscopy versus noninvasive testing/Cologuard, but need to delineate if patient is surgical candidate for colon cancer resection if confirmed. Ordered CEA level. Recommended CLD and Miralax  to keep option open for colonoscopy early next week. - CEA pending  # Acute on chronic blood loss anemia - Presented with Hgb of 7.9, significantly reduced relative to most recent baseline ~ 14.0 - Patient reports intermittent episodes of melena and BRBPR ongoing for several months. Denies any other source of active bleeding.  - CTA GI bleed negative for active bleeding but concerning for mid transverse colon malignancy - Received 1 unit whole blood on 12/18 - Hgb stable  # Hemorrhagic shock - resolved - Admitted to ICU after becoming hypotensive in the ED despite fluid resuscitation hold blood transfusion requiring Levophed  - Off Levophed  since 5 AM 12/19 - BP stable, SBP 90-120s  # Rectal prolapse, chronic - Not a surgical candidate per surgery. Patient notes she was previously informed of not being a surgical candidate during a prior evaluation - Continue Miralax   - PRN analgesics  # COPD # Chronic hypoxic respiratory failure on home O2 3L [place] - Per EMS's report, patient was hypoxic with SpO2 in mid-80s requiring NRB.  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 - CTA abdomen showed severe aortomesenteric atherosclerosis noting patent celiac stent with moderate narrowing of SMA origin - On aspirin  and Brilinta  PTA, holding now in the  setting of anemia - Continue Lipitor  80 mg   # Chronic HFimpEF - TTE (12/2023) showed LVEF 60-65 (improved from prior 40-45%) and G1DD - Resumed home metoprolol -XL 25 mg with hold parameters - Appears euvolemic, will hold home Torsemide   # Prior CVA - On aspirin  and Brilinta  PTA, holding now in the setting of anemia - Continue Lipitor  80 mg   # Lactic acidosis - resolved - Likely secondary to hemorrhagic shock  #Hypomagnesemia - resolved  # Hypothyroidism - Continue Synthroid  75 mcg daily  # Dementia - Continue Namenda  10 mg daily  # Chronic pain syndrome - Continue Cymbalta , PRN oxycodone   #RLS - Continue home pramipexole   # GOC - Palliative care consulted  DVT prophylaxis: SCDs Start: 06/01/24 2207   Code Status:   Code Status: Full Code  Family Communication: None at bedside  Disposition Plan: Return to [facility] once clinically improved PT - Follow Up Recommendations: Long-term institutional care without follow-up therapy - PT equipment: None recommended by PT OT - Follow Up Recommendations: No OT follow up -    Level of care: Progressive  Consultants:  PCCM, GI  Procedures:  None  Antimicrobials: None  Subjective: Patient seen at bedside.  Reports some lower abdominal pain radiating to bilateral flanks.  Denies any nausea or vomiting.  Has not had any further bowel movements or noted any blood per rectum.  Denies fevers or chills.  Of note, patient expressed tangential thoughts.    Objective: Vitals:   06/03/24 1500 06/03/24 1600 06/03/24 1700 06/03/24 1800  BP: 123/86 119/84 (!) 118/97 95/68  Pulse: 98 (!) 105 (!) 112 (!) 115  Resp: 10 13 17 13   Temp:  98.6 F (37 C)    TempSrc:  Oral    SpO2: 96% 94% 93% 94%  Weight:      Height:        Intake/Output Summary (Last 24 hours) at 06/03/2024 1824 Last data filed at 06/03/2024 1700 Gross per 24 hour  Intake 2302.2 ml  Output 1400 ml  Net 902.2 ml   Filed Weights   06/02/24 0050  06/03/24 0500  Weight: 74.1 kg 73.2 kg    Examination:  Gen: NAD, A&Ox3 HEENT: NCAT, EOMI Neck: Supple, no JVD CV: RRR, no murmurs Resp: normal WOB, CTAB Abd: Soft, NTND, no guarding Ext: No LE edema Skin: Warm, dry, no rashes/lesions Neuro: No focal deficits Psych: Calm, cooperative, some tangential thoughts   Data Reviewed: I have personally reviewed following labs and imaging studies  CBC: Recent Labs  Lab 06/01/24 1630 06/01/24 1828 06/01/24 2120 06/02/24 0524 06/02/24 0955 06/02/24 1532 06/03/24 0657  WBC 11.6*  --  12.7* 9.6 11.5* 13.2* 12.6*  NEUTROABS 6.0  --   --   --   --   --   --   HGB 7.9*   < > 9.5* 10.2* 9.8* 9.2* 9.4*  HCT 27.2*   < > 30.6* 32.8* 31.5* 29.3* 30.1*  MCV 92.8  --  91.9 90.6 90.3 91.3 91.8  PLT 330  --  317 353 327 319 194   < > = values in this interval not displayed.   Basic Metabolic Panel: Recent Labs  Lab 06/01/24 1630 06/01/24 1828 06/02/24 0524 06/03/24 0657 06/03/24 0828  NA  141 140 144  --  140  K 4.1 4.3 3.8  --  4.1  CL 96* 94* 96*  --  98  CO2 34*  --  32  --  34*  GLUCOSE 130* 153* 128*  --  93  BUN 24* 30* 19  --  18  CREATININE 1.09* 1.20* 0.92  --  0.89  CALCIUM  9.3  --  9.8  --  9.2  MG 1.8  --  1.6* 2.3  --   PHOS  --   --  3.0 3.5  --    GFR: Estimated Creatinine Clearance: 40.8 mL/min (by C-G formula based on SCr of 0.89 mg/dL). Liver Function Tests: Recent Labs  Lab 06/01/24 1630 06/01/24 2120  AST 26  --   ALT 13  --   ALKPHOS 109  --   BILITOT <0.2 0.5  PROT 7.2  --   ALBUMIN 4.0  --    No results for input(s): LIPASE, AMYLASE in the last 168 hours. No results for input(s): AMMONIA in the last 168 hours. Coagulation Profile: Recent Labs  Lab 06/01/24 1958  INR 0.9   Cardiac Enzymes: No results for input(s): CKTOTAL, CKMB, CKMBINDEX, TROPONINI in the last 168 hours. BNP (last 3 results) Recent Labs    06/01/24 1630  PROBNP 217.0   HbA1C: No results for input(s):  HGBA1C in the last 72 hours. CBG: Recent Labs  Lab 06/02/24 2315 06/03/24 0320 06/03/24 0734 06/03/24 1156 06/03/24 1621  GLUCAP 100* 97 94 136* 86   Lipid Profile: No results for input(s): CHOL, HDL, LDLCALC, TRIG, CHOLHDL, LDLDIRECT in the last 72 hours. Thyroid Function Tests: No results for input(s): TSH, T4TOTAL, FREET4, T3FREE, THYROIDAB in the last 72 hours. Anemia Panel: Recent Labs    06/02/24 0956  VITAMINB12 1,124*  FOLATE 14.3  FERRITIN 29  TIBC 455*  IRON 20*  RETICCTPCT 1.6   Sepsis Labs: Recent Labs  Lab 06/01/24 1829 06/01/24 2006 06/02/24 1042  LATICACIDVEN 3.9* 3.6* 1.3    Recent Results (from the past 240 hours)  Resp panel by RT-PCR (RSV, Flu A&B, Covid) Anterior Nasal Swab     Status: None   Collection Time: 06/01/24  4:20 PM   Specimen: Anterior Nasal Swab  Result Value Ref Range Status   SARS Coronavirus 2 by RT PCR NEGATIVE NEGATIVE Final   Influenza A by PCR NEGATIVE NEGATIVE Final   Influenza B by PCR NEGATIVE NEGATIVE Final    Comment: (NOTE) The Xpert Xpress SARS-CoV-2/FLU/RSV plus assay is intended as an aid in the diagnosis of influenza from Nasopharyngeal swab specimens and should not be used as a sole basis for treatment. Nasal washings and aspirates are unacceptable for Xpert Xpress SARS-CoV-2/FLU/RSV testing.  Fact Sheet for Patients: bloggercourse.com  Fact Sheet for Healthcare Providers: seriousbroker.it  This test is not yet approved or cleared by the [place]  FDA and has been authorized for detection and/or diagnosis of SARS-CoV-2 by FDA under an Emergency Use Authorization (EUA). This EUA will remain in effect (meaning this test can be used) for the duration of the COVID-19 declaration under Section 564(b)(1) of the Act, 21 U.S.C. section 360bbb-3(b)(1), unless the authorization is terminated or revoked.     Resp Syncytial Virus by PCR  NEGATIVE NEGATIVE Final    Comment: (NOTE) Fact Sheet for Patients: bloggercourse.com  Fact Sheet for Healthcare Providers: seriousbroker.it  This test is not yet approved or cleared by the [place]  FDA and has been authorized for detection and/or diagnosis  of SARS-CoV-2 by FDA under an Emergency Use Authorization (EUA). This EUA will remain in effect (meaning this test can be used) for the duration of the COVID-19 declaration under Section 564(b)(1) of the Act, 21 U.S.C. section 360bbb-3(b)(1), unless the authorization is terminated or revoked.  Performed at [facility], [address], [place], [place] [address]   MRSA Next Gen by PCR, Nasal     Status: None   Collection Time: 06/02/24  2:00 AM   Specimen: Nasal Mucosa; Nasal Swab  Result Value Ref Range Status   MRSA by PCR Next Gen NOT DETECTED NOT DETECTED Final    Comment: (NOTE) The GeneXpert MRSA Assay (FDA approved for NASAL specimens only), is one component of a comprehensive MRSA colonization surveillance program. It is not intended to diagnose MRSA infection nor to guide or monitor treatment for MRSA infections. Test performance is not FDA approved in patients less than 70 years old. Performed at [facility], [address] [place], [place] [address]      Radiology Studies: CT ANGIO GI BLEED Result Date: 06/01/2024 EXAM: CTA ABDOMEN AND PELVIS WITH CONTRAST 06/01/2024 06:52:00 PM TECHNIQUE: CTA images of the abdomen and pelvis with intravenous contrast. 85 mL (iohexol  (OMNIPAQUE ) 350 MG/ML injection 85 mL IOHEXOL  350 MG/ML SOLN) was administered. Three-dimensional MIP/volume rendered formations were performed. Automated exposure control, iterative reconstruction, and/or weight based adjustment of the mA/kV was utilized to reduce the radiation dose to as low as reasonably achievable. COMPARISON: CT abdomen and pelvis 06/15/2023. CLINICAL HISTORY:  drop in hgb FINDINGS: VASCULATURE: GI BLEED: No active extravasation of contrast within the GI tract. No excretion of intravenous contrast within the lumen of the small or large bowel. AORTA: Severe atherosclerotic plaque in the aorta and its main branches. Tortuous abdominal aorta. No acute finding. No abdominal aortic aneurysm. No dissection. CELIAC TRUNK: Atherosclerotic plaque.Celiac origin stent patent. No acute finding. No occlusion or significant stenosis. SUPERIOR MESENTERIC ARTERY: Atherosclerotic plaque. At least moderate narrowing of the origin of the superior mesenteric artery. 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 RENAL ARTERIES: Atherosclerotic plaque. At least moderate narrowing of the origin of the renal arteries. ILIAC ARTERIES: Severe atherosclerotic plaque of the iliac arteries. FEMORAL ARTERIES: Mild-to-moderate atherosclerotic plaque partially visualized in diminutive femoral arteries. ABDOMEN/PELVIS: LOWER CHEST: Coronary artery calcifications. Subpleural triangular pulmonary microdnoules along the right fissure consistent with an intrapulmonary lymph node - note further follow-up indicated. LIVER: The liver is unremarkable. GALLBLADDER AND BILE DUCTS: Gallbladder is unremarkable. No biliary ductal dilatation. SPLEEN: The spleen is unremarkable. PANCREAS: The pancreas is unremarkable. ADRENAL GLANDS: Bilateral adrenal glands demonstrate no acute abnormality. KIDNEYS, URETERS AND BLADDER: No stones in the kidneys or ureters. No hydronephrosis. No perinephric or periureteral stranding. Urinary bladder is unremarkable. GI AND BOWEL: Tiny hiatal hernia. Stomach and duodenal sweep demonstrate no acute abnormality. No small bowel thickening or dilatation. Poorly evaluated short segment mid transverse colon irregular bowel wall thickening concerning for underlying malignancy (9:78). This finding does not appear to represent  peristalsis given similar appearance on the noncontrast, venous, arterial images. The appendix is not definitely identified with no inflammatory changes in the right lower quadrant to suggest acute appendicitis. Colonic diverticulosis. Pelvic floor laxity with rectal prolapse and associated chronic rectal wall thickening. There is no bowel obstruction. No abnormal bowel wall thickening or distension. REPRODUCTIVE: Reproductive organs are unremarkable. PERITONEUM AND RETROPERITONEUM: No ascites or free air. LYMPH NODES: No lymphadenopathy. BONES AND SOFT  TISSUES: No acute abnormality of the bones. No acute soft tissue abnormality. IMPRESSION: 1. No CT evidence of active gastrointestinal hemorrage. 2. Short segment mid transverse colon irregular wall thickening, concerning for underlying malignancy. Recommend colonoscopy. 3. Rectal prolapse with chronic associated rectal wall thickening. 4. Colonic diverticulosis with no acute diverticulitis . 5. Severe atherosclerotic plaque in the aorta and its main branches, with at least moderate narrowing of the origin of the superior mesenteric artery. 6. Other, non-acute and/or normal findings as above. 7. Severe atherosclerotic plaque . Patient celiac stent. Electronically signed by: Morgane Naveau MD 06/01/2024 07:13 PM EST RP Workstation: [id number]    Scheduled Meds:  acetaminophen   1,000 mg Oral Q8H   atorvastatin   80 mg Oral QHS   budesonide -glycopyrrolate -formoterol   2 puff Inhalation BID   Chlorhexidine  Gluconate Cloth  6 each Topical Daily   DULoxetine   60 mg Oral Daily   fluticasone   1 spray Each Nare Daily   gabapentin   500 mg Oral TID   insulin  aspart  0-9 Units Subcutaneous Q4H   levothyroxine   75 mcg Oral QAC breakfast   memantine   10 mg Oral BID   pantoprazole  (PROTONIX ) IV  40 mg Intravenous Q12H   polyethylene glycol  17 g Oral QID   pramipexole   0.25 mg Oral BID   Continuous Infusions:   Unresulted Labs (From admission, onward)     Start      Ordered   06/04/24 0500  CEA  Tomorrow morning,   R       Question:  Specimen collection method  Answer:  Lab=Lab collect   06/03/24 0859   06/03/24 0500  CBC  Daily,   R     Question:  Specimen collection method  Answer:  Lab=Lab collect   06/02/24 1111   06/03/24 0500  Basic metabolic panel  Daily,   R     Question:  Specimen collection method  Answer:  Lab=Lab collect   06/02/24 1111   06/03/24 0500  Magnesium   Daily,   R     Question:  Specimen collection method  Answer:  Lab=Lab collect   06/02/24 1111             LOS:  LOS: 2 days   Time Spent: 55 minutes  Kadesia Robel Al-Sultani, MD [facility]  If 7PM-7AM, please contact night-coverage  06/03/2024, 6:24 PM      "

## 2024-06-03 NOTE — Evaluation (Signed)
 Occupational Therapy Evaluation Patient Details Name: Amy Mckay MRN: [id number] DOB: 09-30-1934 Today's Date: 06/03/2024   History of Present Illness   89 y.o. F adm 06/01/24 from [facility] with rectal prolapse, anemia, GIB. PMHx: COPD on 3L, chronic anemia, GERD, HTN, PAD, hypothyroidism, dementia, mood disorder     Clinical Impressions Prior to this admission, patient living at [facility], using a manual wheelchair like a rollator so she is able to transport her oxygen and her belongings. Patient is on 3L at baseline. Currently, patient is at her baseline with regard to ADLs and functional mobility, with limiting factor being pain in her rectum. OT will sign off at this time, with patient with no further acute OT needs. Patient would benefit from mobility specialists while in house to maintain current level of functional mobility. Please re-consult if further acute needs arise.      If plan is discharge home, recommend the following:   Assist for transportation;Assistance with cooking/housework;A little help with bathing/dressing/bathroom     Functional Status Assessment   Patient has not had a recent decline in their functional status     Equipment Recommendations   None recommended by OT     Recommendations for Other Services         Precautions/Restrictions   Precautions Precautions: Fall;Other (comment) Recall of Precautions/Restrictions: Intact Precaution/Restrictions Comments: O2 at baseline Restrictions Weight Bearing Restrictions Per Provider Order: No     Mobility Bed Mobility Overal bed mobility: Needs Assistance Bed Mobility: Supine to Sit, Sit to Supine     Supine to sit: Contact guard Sit to supine: Contact guard assist   General bed mobility comments: CGA for line management    Transfers Overall transfer level: Needs assistance   Transfers: Sit to/from Stand Sit to Stand: Contact guard assist            General transfer comment: CGA to stand, taking incremental steps to HOB, decreased sitting tolerance due to rectal pain      Balance Overall balance assessment: Needs assistance Sitting-balance support: Bilateral upper extremity supported, Feet supported Sitting balance-Leahy Scale: Fair     Standing balance support: Bilateral upper extremity supported, Reliant on assistive device for balance, During functional activity Standing balance-Leahy Scale: Poor Standing balance comment: Reliant on RW                           ADL either performed or assessed with clinical judgement   ADL Overall ADL's : At baseline                                       General ADL Comments: Prior to this admission, patient living at [facility], using a manual wheelchair like a rollator so she is able to transport her oxygen and her belongings. Patient is on 3L at baseline. Currently, patient is at her baseline with regard to ADLs and functional mobility, with limiting factor being pain in her rectum. OT will sign off at this time, with patient with no further acute OT needs. Patient would benefit from mobility specialists while in house to maintain current level of functional mobility. Please re-consult if further acute needs arise.     Vision Baseline Vision/History: 1 Wears glasses Ability to See in Adequate Light: 0 Adequate Patient Visual Report: No change from baseline Vision Assessment?: No apparent visual deficits  Perception Perception: Within Functional Limits       Praxis Praxis: WFL       Pertinent Vitals/Pain Pain Assessment Pain Assessment: 0-10 Pain Score: 6  Pain Location: rectum with sitting Pain Descriptors / Indicators: Constant Pain Intervention(s): Limited activity within patient's tolerance, Monitored during session, Repositioned     Extremity/Trunk Assessment Upper Extremity Assessment Upper Extremity Assessment: Overall WFL for tasks  assessed;Right hand dominant   Lower Extremity Assessment Lower Extremity Assessment: Overall WFL for tasks assessed   Cervical / Trunk Assessment Cervical / Trunk Assessment: Kyphotic (minimally)   Communication Communication Communication: Impaired Factors Affecting Communication: Hearing impaired   Cognition Arousal: Alert Behavior During Therapy: WFL for tasks assessed/performed Cognition: No apparent impairments                               Following commands: Intact       Cueing  General Comments   Cueing Techniques: Verbal cues  VSS on 3L   Exercises     Shoulder Instructions      Home Living Family/patient expects to be discharged to:: Skilled nursing facility                                 Additional Comments: Resides at [facility]      Prior Functioning/Environment Prior Level of Function : Needs assist             Mobility Comments: States that she pushes her w/c (like a rollator) in the around for mobility. 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    OT Problem List: Decreased activity tolerance   OT Treatment/Interventions:        OT Goals(Current goals can be found in the care plan section)   Acute Rehab OT Goals Patient Stated Goal: to get better OT Goal Formulation: With patient Time For Goal Achievement: 06/17/24 Potential to Achieve Goals: Good   OT Frequency:       Co-evaluation              AM-PAC OT 6 Clicks Daily Activity     Outcome Measure Help from another person eating meals?: None Help from another person taking care of personal grooming?: None Help from another person toileting, which includes using toliet, bedpan, or urinal?: None Help from another person bathing (including washing, rinsing, drying)?: None Help from another person to put on and taking off regular upper body clothing?: None Help from another person to put on and taking off regular lower body  clothing?: None 6 Click Score: 24   End of Session Nurse Communication: Mobility status  Activity Tolerance: Patient tolerated treatment well Patient left: in bed;with call bell/phone within reach;with bed alarm set  OT Visit Diagnosis: Muscle weakness (generalized) (M62.81);Pain                Time: 8985-8962 OT Time Calculation (min): 23 min Charges:  OT General Charges $OT Visit: 1 Visit OT Evaluation $OT Eval Moderate Complexity: 1 Mod OT Treatments $Self Care/Home Management : 8-22 mins  Ronal Gift E. Shaniqwa Horsman, OTR/L Acute Rehabilitation Services [phone]   Ronal Gift Salt 06/03/2024, 12:48 PM

## 2024-06-04 ENCOUNTER — Other Ambulatory Visit (HOSPITAL_COMMUNITY): Payer: Self-pay

## 2024-06-04 DIAGNOSIS — K5791 Diverticulosis of intestine, part unspecified, without perforation or abscess with bleeding: Secondary | ICD-10-CM | POA: Diagnosis not present

## 2024-06-04 DIAGNOSIS — I5022 Chronic systolic (congestive) heart failure: Secondary | ICD-10-CM

## 2024-06-04 DIAGNOSIS — Z515 Encounter for palliative care: Secondary | ICD-10-CM | POA: Diagnosis not present

## 2024-06-04 DIAGNOSIS — K623 Rectal prolapse: Secondary | ICD-10-CM

## 2024-06-04 DIAGNOSIS — D500 Iron deficiency anemia secondary to blood loss (chronic): Secondary | ICD-10-CM | POA: Diagnosis not present

## 2024-06-04 DIAGNOSIS — R578 Other shock: Secondary | ICD-10-CM

## 2024-06-04 LAB — GLUCOSE, CAPILLARY
Glucose-Capillary: 96 mg/dL (ref 70–99)
Glucose-Capillary: 113 mg/dL — ABNORMAL HIGH (ref 70–99)
Glucose-Capillary: 91 mg/dL (ref 70–99)
Glucose-Capillary: 74 mg/dL (ref 70–99)
Glucose-Capillary: 94 mg/dL (ref 70–99)

## 2024-06-04 LAB — CBC
HCT: 30.7 % — ABNORMAL LOW (ref 36.0–46.0)
Hemoglobin: 9.5 g/dL — ABNORMAL LOW (ref 12.0–15.0)
MCH: 28.3 pg (ref 26.0–34.0)
MCHC: 30.9 g/dL (ref 30.0–36.0)
MCV: 91.4 fL (ref 80.0–100.0)
Platelets: 310 K/uL (ref 150–400)
RBC: 3.36 MIL/uL — ABNORMAL LOW (ref 3.87–5.11)
RDW: 15.3 % (ref 11.5–15.5)
WBC: 8.7 K/uL (ref 4.0–10.5)
nRBC: 0 % (ref 0.0–0.2)

## 2024-06-04 LAB — BASIC METABOLIC PANEL WITH GFR
Anion gap: 9 (ref 5–15)
BUN: 15 mg/dL (ref 8–23)
CO2: 31 mmol/L (ref 22–32)
Calcium: 8.9 mg/dL (ref 8.9–10.3)
Chloride: 100 mmol/L (ref 98–111)
Creatinine, Ser: 0.87 mg/dL (ref 0.44–1.00)
GFR, Estimated: >60 mL/min
Glucose, Bld: 92 mg/dL (ref 70–99)
Potassium: 3.9 mmol/L (ref 3.5–5.1)
Sodium: 140 mmol/L (ref 135–145)

## 2024-06-04 LAB — MAGNESIUM: Magnesium: 2.1 mg/dL (ref 1.7–2.4)

## 2024-06-04 MED ORDER — GABAPENTIN 300 MG PO CAPS: 300.0000 mg | ORAL_CAPSULE | Freq: Three times a day (TID) | ORAL | Status: DC

## 2024-06-04 MED ORDER — GABAPENTIN 300 MG PO CAPS
300.0000 mg | ORAL_CAPSULE | Freq: Three times a day (TID) | ORAL | Status: DC
  Administered 2024-06-04 – 2024-06-05 (×3): 300 mg via ORAL
  Filled 2024-06-04 (×3): qty 1

## 2024-06-04 NOTE — Plan of Care (Signed)
" °  Problem: Metabolic: Goal: Ability to maintain appropriate glucose levels will improve Outcome: Progressing   Problem: Nutritional: Goal: Maintenance of adequate nutrition will improve Outcome: Progressing Goal: Progress toward achieving an optimal weight will improve Outcome: Progressing   Problem: Skin Integrity: Goal: Risk for impaired skin integrity will decrease Outcome: Progressing   Problem: Tissue Perfusion: Goal: Adequacy of tissue perfusion will improve Outcome: Progressing   Problem: Education: Goal: Knowledge of General Education information will improve Description: Including pain rating scale, medication(s)/side effects and non-pharmacologic comfort measures Outcome: Progressing   "

## 2024-06-04 NOTE — Progress Notes (Signed)
 Transferred pt to 5W.  All belongings transported with pt.
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
## 2024-06-04 NOTE — Progress Notes (Signed)
 "                        PROGRESS NOTE        PATIENT DETAILS Name: Amy Mckay Age: 89 y.o. Sex: female Date of Birth: 28-Aug-1934 Admit Date: 06/01/2024 Admitting Physician Orlin Fairly, MD ERE:Jdzwdn, Aleene, MD  Brief Summary: Patient is a 89 y.o.  female with history of COPD on 3 L of oxygen, chronic rectal prolapse (not a surgical candidate per prior notes), mesenteric ischemia-s/p SMA stenting, prior CVA-chronic debility/deconditioning-(minimally ambulatory-mostly wheelchair-bound)-brought to the ED with shortness of breath-found to have hypotension-and presumed acute blood loss anemia with hypotension.  She was initially admitted to the ICU-stabilized and transferred to [facility].  Significant events: 12/18>> admit to ICU-hypotension-Hb 7.9-concern for GI bleed. 12/20>> admit to [facility]  Significant studies: 12/18>> CXR: No PNA 12/18>> CT GI bleed study: No active extravasation-thickening in mid transverse colon-concerning for malignancy.  Significant microbiology data: 12/18>> COVID/influenza/RSV PCR: Negative  Procedures: None  Consults: GI  Subjective: Lying comfortably in bed-denies any chest pain or shortness of breath.  Brown stools this morning-patient is not keen on pursuing colonoscopy but has an open mind.  See discussion below.  Objective: Vitals: Blood pressure 108/80, pulse 98, temperature 99 F (37.2 C), temperature source Oral, resp. rate 14, height 5' 1 (1.549 m), weight 73.1 kg, SpO2 92%.   Exam: Gen Exam:Alert awake-not in any distress HEENT:atraumatic, normocephalic Chest: B/L clear to auscultation anteriorly CVS:S1S2 regular Abdomen:soft non tender, non distended Extremities:no edema Neurology: Non focal Skin: no rash  Pertinent Labs/Radiology:    Latest Ref Rng & Units 06/04/2024    4:08 AM 06/03/2024    6:57 AM 06/02/2024    3:32 PM  CBC  WBC 4.0 - 10.5 K/uL 8.7  12.6  13.2   Hemoglobin 12.0 - 15.0 g/dL 9.5  9.4  9.2   Hematocrit  36.0 - 46.0 % 30.7  30.1  29.3   Platelets 150 - 400 K/uL 310  194  319     Lab Results  Component Value Date   NA 140 06/04/2024   K 3.9 06/04/2024   CL 100 06/04/2024   CO2 31 06/04/2024      Assessment/Plan: Hemorrhagic shock Thought to be secondary to GI bleed Briefly required Levophed -1 unit of PRBC while in the ICU BP now stable  LGI bleeding with ABLA Resolved-brown stools earlier this morning Hb stable after 1 unit of PRBC on admission GI following-colonoscopy being contemplated in the next several days-however unclear if patient actually wants to proceed-as she is not keen on pursuing colonoscopy-as she feels that it probably will not change management as she will probably refuse surgery/chemotherapy/radiation.  Palliative care consulted 12/20-await further goals of care discussion.  I briefly spoke with patient's daughter over the phone 12/21 and encouraged family discussion about whether or not we should even proceed with colonoscopy.  History of mesenteric ischemia-s/p celiac artery/SMA stenting Abdominal exam benign Aspirin /Brilinta  held due to ABLA/LGI bleed-resume when able Continue statin  Rectal prolapse Chronic issue Per prior notes-not a surgical candidate  COPD with chronic hypoxic respiratory failure-on 3 L of oxygen Not in exacerbation Bronchodilators Continue usual oxygen supplementation  Chronic HFrEF with recovered EF Euvolemic Continue beta-blocker Dose diuretics as needed for now  Prior CVA DAPT held due to anemia/LGI bleed Continue Lipitor   Hypothyroidism Synthroid   Dementia Namenda   RLS Requip  Chronic pain Stable Cymbalta  As needed oxycodone   Class 1 Obesity: Estimated body mass  index is 30.45 kg/m as calculated from the following:   Height as of this encounter: 5' 1 (1.549 m).   Weight as of this encounter: 73.1 kg.   Code status:   Code Status: Full Code   DVT Prophylaxis: SCDs Start: 06/01/24 2207   Family  Communication: Daughter-Cynthia-[phone]-updated over the phone on 12/21   Disposition Plan: Status is: Inpatient Remains inpatient appropriate because: Severity of illness   Planned Discharge Destination:Skilled nursing facility   Diet: Diet Order             Diet clear liquid Room service appropriate? Yes; Fluid consistency: Thin  Diet effective now                     Antimicrobial agents: Anti-infectives (From admission, onward)    None        MEDICATIONS: Scheduled Meds:  acetaminophen   1,000 mg Oral Q8H   atorvastatin   80 mg Oral QHS   budesonide -glycopyrrolate -formoterol   2 puff Inhalation BID   Chlorhexidine  Gluconate Cloth  6 each Topical Daily   DULoxetine   60 mg Oral Daily   fluticasone   1 spray Each Nare Daily   gabapentin   500 mg Oral TID   insulin  aspart  0-9 Units Subcutaneous Q4H   levothyroxine   75 mcg Oral QAC breakfast   memantine   10 mg Oral BID   metoprolol  succinate  25 mg Oral Daily   pantoprazole  (PROTONIX ) IV  40 mg Intravenous Q12H   polyethylene glycol  17 g Oral QID   pramipexole   0.25 mg Oral BID   Continuous Infusions:  lactated ringers  50 mL/hr at 06/04/24 0200   PRN Meds:.HYDROmorphone  (DILAUDID ) injection, ipratropium-albuterol , ondansetron  (ZOFRAN ) IV, mouth rinse, oxyCODONE    I have personally reviewed following labs and imaging studies  LABORATORY DATA: CBC: Recent Labs  Lab 06/01/24 1630 06/01/24 1828 06/02/24 0524 06/02/24 0955 06/02/24 1532 06/03/24 0657 06/04/24 0408  WBC 11.6*   < > 9.6 11.5* 13.2* 12.6* 8.7  NEUTROABS 6.0  --   --   --   --   --   --   HGB 7.9*   < > 10.2* 9.8* 9.2* 9.4* 9.5*  HCT 27.2*   < > 32.8* 31.5* 29.3* 30.1* 30.7*  MCV 92.8   < > 90.6 90.3 91.3 91.8 91.4  PLT 330   < > 353 327 319 194 310   < > = values in this interval not displayed.    Basic Metabolic Panel: Recent Labs  Lab 06/01/24 1630 06/01/24 1828 06/02/24 0524 06/03/24 0657 06/03/24 0828 06/04/24 0408   NA 141 140 144  --  140 140  K 4.1 4.3 3.8  --  4.1 3.9  CL 96* 94* 96*  --  98 100  CO2 34*  --  32  --  34* 31  GLUCOSE 130* 153* 128*  --  93 92  BUN 24* 30* 19  --  18 15  CREATININE 1.09* 1.20* 0.92  --  0.89 0.87  CALCIUM  9.3  --  9.8  --  9.2 8.9  MG 1.8  --  1.6* 2.3  --  2.1  PHOS  --   --  3.0 3.5  --   --     GFR: Estimated Creatinine Clearance: 41.6 mL/min (by C-G formula based on SCr of 0.87 mg/dL).  Liver Function Tests: Recent Labs  Lab 06/01/24 1630 06/01/24 2120  AST 26  --   ALT 13  --   ALKPHOS 109  --  BILITOT <0.2 0.5  PROT 7.2  --   ALBUMIN 4.0  --    No results for input(s): LIPASE, AMYLASE in the last 168 hours. No results for input(s): AMMONIA in the last 168 hours.  Coagulation Profile: Recent Labs  Lab 06/01/24 1958  INR 0.9    Cardiac Enzymes: No results for input(s): CKTOTAL, CKMB, CKMBINDEX, TROPONINI in the last 168 hours.  BNP (last 3 results) Recent Labs    06/01/24 1630  PROBNP 217.0    Lipid Profile: No results for input(s): CHOL, HDL, LDLCALC, TRIG, CHOLHDL, LDLDIRECT in the last 72 hours.  Thyroid Function Tests: No results for input(s): TSH, T4TOTAL, FREET4, T3FREE, THYROIDAB in the last 72 hours.  Anemia Panel: Recent Labs    06/02/24 0956  VITAMINB12 1,124*  FOLATE 14.3  FERRITIN 29  TIBC 455*  IRON 20*  RETICCTPCT 1.6    Urine analysis:    Component Value Date/Time   COLORURINE STRAW (A) 06/01/2024 1752   APPEARANCEUR CLEAR 06/01/2024 1752   LABSPEC 1.006 06/01/2024 1752   PHURINE 7.0 06/01/2024 1752   GLUCOSEU NEGATIVE 06/01/2024 1752   HGBUR SMALL (A) 06/01/2024 1752   BILIRUBINUR NEGATIVE 06/01/2024 1752   KETONESUR NEGATIVE 06/01/2024 1752   PROTEINUR 30 (A) 06/01/2024 1752   NITRITE NEGATIVE 06/01/2024 1752   LEUKOCYTESUR MODERATE (A) 06/01/2024 1752    Sepsis Labs: Lactic Acid, Venous    Component Value Date/Time   LATICACIDVEN 1.3 06/02/2024 1042     MICROBIOLOGY: Recent Results (from the past 240 hours)  Resp panel by RT-PCR (RSV, Flu A&B, Covid) Anterior Nasal Swab     Status: None   Collection Time: 06/01/24  4:20 PM   Specimen: Anterior Nasal Swab  Result Value Ref Range Status   SARS Coronavirus 2 by RT PCR NEGATIVE NEGATIVE Final   Influenza A by PCR NEGATIVE NEGATIVE Final   Influenza B by PCR NEGATIVE NEGATIVE Final    Comment: (NOTE) The Xpert Xpress SARS-CoV-2/FLU/RSV plus assay is intended as an aid in the diagnosis of influenza from Nasopharyngeal swab specimens and should not be used as a sole basis for treatment. Nasal washings and aspirates are unacceptable for Xpert Xpress SARS-CoV-2/FLU/RSV testing.  Fact Sheet for Patients: bloggercourse.com  Fact Sheet for Healthcare Providers: seriousbroker.it  This test is not yet approved or cleared by the [place]  FDA and has been authorized for detection and/or diagnosis of SARS-CoV-2 by FDA under an Emergency Use Authorization (EUA). This EUA will remain in effect (meaning this test can be used) for the duration of the COVID-19 declaration under Section 564(b)(1) of the Act, 21 U.S.C. section 360bbb-3(b)(1), unless the authorization is terminated or revoked.     Resp Syncytial Virus by PCR NEGATIVE NEGATIVE Final    Comment: (NOTE) Fact Sheet for Patients: bloggercourse.com  Fact Sheet for Healthcare Providers: seriousbroker.it  This test is not yet approved or cleared by the [place]  FDA and has been authorized for detection and/or diagnosis of SARS-CoV-2 by FDA under an Emergency Use Authorization (EUA). This EUA will remain in effect (meaning this test can be used) for the duration of the COVID-19 declaration under Section 564(b)(1) of the Act, 21 U.S.C. section 360bbb-3(b)(1), unless the authorization is terminated or revoked.  Performed at  [facility], [address] [place], [place] [address]   MRSA Next Gen by PCR, Nasal     Status: None   Collection Time: 06/02/24  2:00 AM   Specimen: Nasal Mucosa; Nasal Swab  Result  Value Ref Range Status   MRSA by PCR Next Gen NOT DETECTED NOT DETECTED Final    Comment: (NOTE) The GeneXpert MRSA Assay (FDA approved for NASAL specimens only), is one component of a comprehensive MRSA colonization surveillance program. It is not intended to diagnose MRSA infection nor to guide or monitor treatment for MRSA infections. Test performance is not FDA approved in patients less than 25 years old. Performed at [facility], [address] [place], [place] [address]     RADIOLOGY STUDIES/RESULTS: No results found.   LOS: 3 days   Donalda Applebaum, MD  [facility]    To contact the attending provider between 7A-7P or the covering provider during after hours 7P-7A, please log into the web site [email or web address] and access using universal [place] password for that web site. If you do not have the password, please call the hospital operator.  06/04/2024, 9:28 AM    "

## 2024-06-04 NOTE — Plan of Care (Signed)
" °  Problem: Coping: Goal: Ability to adjust to condition or change in health will improve Outcome: Progressing   Problem: Health Behavior/Discharge Planning: Goal: Ability to manage health-related needs will improve Outcome: Progressing   Problem: Skin Integrity: Goal: Risk for impaired skin integrity will decrease Outcome: Progressing   Problem: Tissue Perfusion: Goal: Adequacy of tissue perfusion will improve Outcome: Progressing   Problem: Health Behavior/Discharge Planning: Goal: Ability to manage health-related needs will improve Outcome: Progressing   "

## 2024-06-04 NOTE — Progress Notes (Signed)
 Telephone call with patient's daughter.  She verbalized understanding that workup is suggestive of possible colon cancer.  She says that she is not interested in pursuing workup or further tests or procedures.  Instead, she says family would prefer to focus on patient's comfort and quality of life.  They are interested in hospice involvement at SNF.  She asks about hospice facility but I do not think that patient would be a candidate for that at this point.  Of note, patient has had hospice involvement in the past.  Daughter also says that patient has communicated with family in the past a desire for DNR/DNI.  At this time, daughter would not want patient to undergo resuscitation or other heroic/aggressive measures at end-of-life.  Again, her goal would be patient's comfort.  Daughter did ask that I change CODE STATUS to DNR/DNI.  Daughter confirms patient has had a long history of mental illness for which she has been disabled many years.  Plan: -TOC for hospice at SNF -DNR/DNI  Discussed with Dr. Raenelle Fonda Mower, PhD, NP-C

## 2024-06-05 DIAGNOSIS — R578 Other shock: Secondary | ICD-10-CM | POA: Diagnosis not present

## 2024-06-05 DIAGNOSIS — Z515 Encounter for palliative care: Secondary | ICD-10-CM | POA: Diagnosis not present

## 2024-06-05 DIAGNOSIS — I5022 Chronic systolic (congestive) heart failure: Secondary | ICD-10-CM | POA: Diagnosis not present

## 2024-06-05 DIAGNOSIS — K5791 Diverticulosis of intestine, part unspecified, without perforation or abscess with bleeding: Secondary | ICD-10-CM | POA: Diagnosis not present

## 2024-06-05 DIAGNOSIS — D500 Iron deficiency anemia secondary to blood loss (chronic): Secondary | ICD-10-CM

## 2024-06-05 LAB — CBC WITH DIFFERENTIAL/PLATELET
Abs Immature Granulocytes: 0.07 K/uL (ref 0.00–0.07)
Basophils Absolute: 0 K/uL (ref 0.0–0.1)
Basophils Relative: 0 %
Eosinophils Absolute: 0.5 K/uL (ref 0.0–0.5)
Eosinophils Relative: 5 %
HCT: 29.9 % — ABNORMAL LOW (ref 36.0–46.0)
Hemoglobin: 9.1 g/dL — ABNORMAL LOW (ref 12.0–15.0)
Immature Granulocytes: 1 %
Lymphocytes Relative: 13 %
Lymphs Abs: 1.2 K/uL (ref 0.7–4.0)
MCH: 28.2 pg (ref 26.0–34.0)
MCHC: 30.4 g/dL (ref 30.0–36.0)
MCV: 92.6 fL (ref 80.0–100.0)
Monocytes Absolute: 1.1 K/uL — ABNORMAL HIGH (ref 0.1–1.0)
Monocytes Relative: 11 %
Neutro Abs: 6.5 K/uL (ref 1.7–7.7)
Neutrophils Relative %: 70 %
Platelets: 288 K/uL (ref 150–400)
RBC: 3.23 MIL/uL — ABNORMAL LOW (ref 3.87–5.11)
RDW: 15.2 % (ref 11.5–15.5)
WBC: 9.4 K/uL (ref 4.0–10.5)
nRBC: 0 % (ref 0.0–0.2)

## 2024-06-05 LAB — GLUCOSE, CAPILLARY
Glucose-Capillary: 103 mg/dL — ABNORMAL HIGH (ref 70–99)
Glucose-Capillary: 108 mg/dL — ABNORMAL HIGH (ref 70–99)
Glucose-Capillary: 191 mg/dL — ABNORMAL HIGH (ref 70–99)
Glucose-Capillary: 99 mg/dL (ref 70–99)

## 2024-06-05 LAB — CEA: CEA: 10.7 ng/mL — ABNORMAL HIGH (ref 0.0–4.7)

## 2024-06-05 MED ORDER — GABAPENTIN 300 MG PO CAPS: 300.0000 mg | ORAL_CAPSULE | Freq: Three times a day (TID) | ORAL | Status: AC

## 2024-06-05 MED ORDER — OXYCODONE HCL 15 MG PO TABS: 15.0000 mg | ORAL_TABLET | Freq: Every day | ORAL | 0 refills | Status: AC | PRN

## 2024-06-05 MED ORDER — OXYCODONE HCL 10 MG PO TABS: 10.0000 mg | ORAL_TABLET | Freq: Four times a day (QID) | ORAL | 0 refills | Status: AC

## 2024-06-05 MED ORDER — HYDROMORPHONE HCL 1 MG/ML IJ SOLN
0.2000 mg | Freq: Once | INTRAMUSCULAR | Status: AC
  Administered 2024-06-05: 0.2 mg via INTRAVENOUS

## 2024-06-05 MED ORDER — TORSEMIDE 20 MG PO TABS: 40.0000 mg | ORAL_TABLET | Freq: Every day | ORAL | Status: AC | PRN

## 2024-06-05 NOTE — Discharge Summary (Signed)
 "  PATIENT DETAILS Name: Amy Mckay Age: 89 y.o. Sex: female Date of Birth: 04-01-1935 MRN: [id number]. Admitting Physician: Orlin Fairly, MD ERE:Jdzwdn, Aleene, MD  Admit Date: 06/01/2024 Discharge date: 06/05/2024  Recommendations for Outpatient Follow-up:  Follow up with PCP in 1-2 weeks Hospice follow-up at SNF Depending on course-attending MD or hospice service at SNF can slowly discontinue some of her medications.  Admitted From:  SNF  Disposition: SNF with hospice follow-up   Discharge Condition: fair  CODE STATUS:   Code Status: Limited: Do not attempt resuscitation (DNR) -DNR-LIMITED -Do Not Intubate/DNI    Diet recommendation:  Diet Order             Diet Heart Room service appropriate? Yes; Fluid consistency: Thin  Diet effective now           Diet general                    Brief Summary: Patient is a 89 y.o.  female with history of COPD on 3 L of oxygen, chronic rectal prolapse (not a surgical candidate per prior notes), mesenteric ischemia-s/p SMA stenting, prior CVA-chronic debility/deconditioning-(minimally ambulatory-mostly wheelchair-bound)-brought to the ED with shortness of breath-found to have hypotension-and presumed acute blood loss anemia with hypotension.  She was initially admitted to the ICU-stabilized and transferred to [facility].   Significant events: 12/18>> admit to ICU-hypotension-Hb 7.9-concern for GI bleed. 12/20>> admit to [facility] 12/21>> discussed with patient and then with daughter-not keen on pursuing colonoscopy-as they believe it will not change further outcome (not considered a candidate for surgical repair of rectal prolapse).  Subsequently palliative care had further goals of care discussion-recommendations are to forego colonoscopy-discharged back to SNF with hospice.   Significant studies: 12/18>> CXR: No PNA 12/18>> CT GI bleed study: No active extravasation-thickening in mid transverse colon-concerning for malignancy.    Significant microbiology data: 12/18>> COVID/influenza/RSV PCR: Negative   Procedures: None   Consults: GI  Brief Hospital Course: Hemorrhagic shock Thought to be secondary to GI bleed Briefly required Levophed -1 unit of PRBC while in the ICU BP now stable   LGI bleeding with ABLA Resolved-brown stools for the past several days Hb stable after 1 unit of PRBC on admission GI following-colonoscopy being contemplated in the next several days-however unclear if patient actually wants to proceed-as she is not keen on pursuing colonoscopy-as she feels that it probably will not change management as she will probably refuse surgery/chemotherapy/radiation (was not considered a candidate for rectal prolapse repair several years back due to medical comorbidities).  Palliative care consulted 12/20-they spoke with the patient's daughter and other family members-family/patient do not want to pursue invasive testing including colonoscopy-recommendations are to discharge back to SNF with hospice follow-up.    History of mesenteric ischemia-s/p celiac artery/SMA stenting Abdominal exam benign Aspirin /Brilinta  held due to ABLA/LGI bleed-since no longer bleeding-brown stools for the past several days-Will resume DAPT Continue statin   Rectal prolapse Chronic issue Per prior notes-not a surgical candidate   COPD with chronic hypoxic respiratory failure-on 3 L of oxygen Not in exacerbation Bronchodilators Continue usual oxygen supplementation   Chronic HFrEF with recovered EF Euvolemic Continue beta-blocker Dose diuretics as needed for now   Prior CVA DAPT held due to anemia/LGI bleed Continue Lipitor    Hypothyroidism Synthroid    Dementia Namenda    RLS Requip   Chronic pain Stable Cymbalta  As needed oxycodone    Class 1 Obesity: Estimated body mass index is 30.45 kg/m as calculated from the following:  Height as of this encounter: 5' 1 (1.549 m).   Weight as of this  encounter: 73.1 kg.      Discharge Diagnoses:  Principal Problem:   Hemorrhagic shock (HCC) Active Problems:   Hypotension   Rectal prolapse   Gastrointestinal hemorrhage associated with intestinal diverticulosis   Chronic systolic heart failure [facility])   Palliative care encounter   Discharge Instructions:  Activity:  As tolerated with Full fall precautions use walker/cane & assistance as needed  Discharge Instructions     Call MD for:  persistant nausea and vomiting   Complete by: As directed    Call MD for:  severe uncontrolled pain   Complete by: As directed    Diet general   Complete by: As directed    Discharge instructions   Complete by: As directed    Follow with Primary MD  Karlene Mungo, MD in 1-2 weeks  Please get a complete blood count and chemistry panel checked by your Primary MD at your next visit, and again as instructed by your Primary MD.  Get Medicines reviewed and adjusted: Please take all your medications with you for your next visit with your Primary MD  Laboratory/radiological data: Please request your Primary MD to go over all hospital tests and procedure/radiological results at the follow up, please ask your Primary MD to get all Hospital records sent to his/her office.  In some cases, they will be blood work, cultures and biopsy results pending at the time of your discharge. Please request that your primary care M.D. follows up on these results.  Also Note the following: If you experience worsening of your admission symptoms, develop shortness of breath, life threatening emergency, suicidal or homicidal thoughts you must seek medical attention immediately by calling 911 or calling your MD immediately  if symptoms less severe.  You must read complete instructions/literature along with all the possible adverse reactions/side effects for all the Medicines you take and that have been prescribed to you. Take any new Medicines after you have completely  understood and accpet all the possible adverse reactions/side effects.   Do not drive when taking Pain medications or sleeping medications (Benzodaizepines)  Do not take more than prescribed Pain, Sleep and Anxiety Medications. It is not advisable to combine anxiety,sleep and pain medications without talking with your primary care practitioner  Special Instructions: If you have smoked or chewed Tobacco  in the last 2 yrs please stop smoking, stop any regular Alcohol  and or any Recreational drug use.  Wear Seat belts while driving.  Please note: You were cared for by a hospitalist during your hospital stay. Once you are discharged, your primary care physician will handle any further medical issues. Please note that NO REFILLS for any discharge medications will be authorized once you are discharged, as it is imperative that you return to your primary care physician (or establish a relationship with a primary care physician if you do not have one) for your post hospital discharge needs so that they can reassess your need for medications and monitor your lab values.   Increase activity slowly   Complete by: As directed       Allergies as of 06/05/2024       Reactions   Penicillins Anaphylaxis, Swelling, Other (See Comments)   Penicillin G Hives   Penicillins Other (See Comments)   Tolerated zosyn  12/31/2023        Medication List     TAKE these medications    pramipexole  0.25  MG tablet Commonly known as: MIRAPEX  Take 0.25 mg by mouth 2 (two) times daily. The timing of this medication is very important.   acetaminophen  500 MG tablet Commonly known as: TYLENOL  Take 1,000 mg by mouth in the morning, at noon, and at bedtime.   aspirin  EC 81 MG tablet Take 81 mg by mouth daily.   atorvastatin  80 MG tablet Commonly known as: LIPITOR  Take 80 mg by mouth at bedtime.   Breztri  Aerosphere 160-9-4.8 MCG/ACT Aero inhaler Generic drug: budesonide -glycopyrrolate -formoterol  Inhale 2  puffs into the lungs in the morning and at bedtime.   Calcium  Carb-Cholecalciferol  600-10 MG-MCG Tabs Take 1 tablet by mouth daily.   Delsym 30 MG/5ML liquid Generic drug: dextromethorphan Take 10 mLs by mouth every 12 (twelve) hours as needed for cough.   DULoxetine  60 MG capsule Commonly known as: CYMBALTA  Take 60 mg by mouth daily.   Flonase  Allergy Relief 50 MCG/ACT nasal spray Generic drug: fluticasone  Place 1 spray into both nostrils daily.   gabapentin  300 MG capsule Commonly known as: NEURONTIN  Take 1 capsule (300 mg total) by mouth 3 (three) times daily. What changed:  medication strength how much to take additional instructions Another medication with the same name was removed. Continue taking this medication, and follow the directions you see here.   guaiFENesin  100 MG/5ML liquid Commonly known as: ROBITUSSIN Take 10 mLs by mouth in the morning, at noon, and at bedtime.   ipratropium-albuterol  0.5-2.5 (3) MG/3ML Soln Commonly known as: DUONEB Take 3 mLs by nebulization every 6 (six) hours.   levothyroxine  75 MCG tablet Commonly known as: SYNTHROID  Take 75 mcg by mouth daily before breakfast.   loperamide 2 MG tablet Commonly known as: IMODIUM A-D Take 4 mg by mouth as needed for diarrhea or loose stools.   memantine  10 MG tablet Commonly known as: NAMENDA  Take 10 mg by mouth 2 (two) times daily.   metoprolol  succinate 25 MG 24 hr tablet Commonly known as: TOPROL -XL Take 25 mg by mouth daily.   Miconazole Powd Apply 1 application  topically See admin instructions. Apply a thin dusting to groin area twice a day between meals as needed for rash.   ondansetron  4 MG disintegrating tablet Commonly known as: ZOFRAN -ODT Take 4 mg by mouth every 8 (eight) hours as needed.   Oxycodone  HCl 10 MG Tabs Take 1 tablet (10 mg total) by mouth in the morning, at noon, in the evening, and at bedtime.   oxyCODONE  15 MG immediate release tablet Commonly known as:  ROXICODONE  Take 1 tablet (15 mg total) by mouth daily as needed for pain.   pantoprazole  40 MG tablet Commonly known as: Protonix  Take 1 tablet (40 mg total) by mouth 2 (two) times daily.   polyethylene glycol powder 17 GM/SCOOP powder Commonly known as: GLYCOLAX /MIRALAX  Take 17 g by mouth daily as needed for mild constipation or moderate constipation.   Preparation H 0.25-14-74.9 % rectal ointment Generic drug: phenylephrine -shark liver oil-mineral oil-petrolatum Place 1 Application rectally 4 (four) times daily.   promethazine 12.5 MG tablet Commonly known as: PHENERGAN Take 12.5 mg by mouth every 8 (eight) hours as needed for nausea.   sodium chloride  0.65 % Soln nasal spray Commonly known as: OCEAN Place 1 spray into both nostrils 3 (three) times daily.   ticagrelor  90 MG Tabs tablet Commonly known as: BRILINTA  Take 1 tablet (90 mg total) by mouth 2 (two) times daily.   torsemide  20 MG tablet Commonly known as: DEMADEX  Take 2 tablets (40 mg  total) by mouth daily as needed (edema/2-3 lb weight gain). What changed:  when to take this reasons to take this   UNABLE TO FIND Apply 1 Application topically in the morning and at bedtime. Diltiazem 2% + Lidocaine  2% Compound Solution        Allergies[1]   Other Procedures/Studies: CT ANGIO GI BLEED Result Date: 06/01/2024 EXAM: CTA ABDOMEN AND PELVIS WITH CONTRAST 06/01/2024 06:52:00 PM TECHNIQUE: CTA images of the abdomen and pelvis with intravenous contrast. 85 mL (iohexol  (OMNIPAQUE ) 350 MG/ML injection 85 mL IOHEXOL  350 MG/ML SOLN) was administered. Three-dimensional MIP/volume rendered formations were performed. Automated exposure control, iterative reconstruction, and/or weight based adjustment of the mA/kV was utilized to reduce the radiation dose to as low as reasonably achievable. COMPARISON: CT abdomen and pelvis 06/15/2023. CLINICAL HISTORY: drop in hgb FINDINGS: VASCULATURE: GI BLEED: No active extravasation of  contrast within the GI tract. No excretion of intravenous contrast within the lumen of the small or large bowel. AORTA: Severe atherosclerotic plaque in the aorta and its main branches. Tortuous abdominal aorta. No acute finding. No abdominal aortic aneurysm. No dissection. CELIAC TRUNK: Atherosclerotic plaque.Celiac origin stent patent. No acute finding. No occlusion or significant stenosis. SUPERIOR MESENTERIC ARTERY: Atherosclerotic plaque. At least moderate narrowing of the origin of the superior mesenteric artery. 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 RENAL ARTERIES: Atherosclerotic plaque. At least moderate narrowing of the origin of the renal arteries. ILIAC ARTERIES: Severe atherosclerotic plaque of the iliac arteries. FEMORAL ARTERIES: Mild-to-moderate atherosclerotic plaque partially visualized in diminutive femoral arteries. ABDOMEN/PELVIS: LOWER CHEST: Coronary artery calcifications. Subpleural triangular pulmonary microdnoules along the right fissure consistent with an intrapulmonary lymph node - note further follow-up indicated. LIVER: The liver is unremarkable. GALLBLADDER AND BILE DUCTS: Gallbladder is unremarkable. No biliary ductal dilatation. SPLEEN: The spleen is unremarkable. PANCREAS: The pancreas is unremarkable. ADRENAL GLANDS: Bilateral adrenal glands demonstrate no acute abnormality. KIDNEYS, URETERS AND BLADDER: No stones in the kidneys or ureters. No hydronephrosis. No perinephric or periureteral stranding. Urinary bladder is unremarkable. GI AND BOWEL: Tiny hiatal hernia. Stomach and duodenal sweep demonstrate no acute abnormality. No small bowel thickening or dilatation. Poorly evaluated short segment mid transverse colon irregular bowel wall thickening concerning for underlying malignancy (9:78). This finding does not appear to represent peristalsis given similar appearance on the noncontrast, venous,  arterial images. The appendix is not definitely identified with no inflammatory changes in the right lower quadrant to suggest acute appendicitis. Colonic diverticulosis. Pelvic floor laxity with rectal prolapse and associated chronic rectal wall thickening. There is no bowel obstruction. No abnormal bowel wall thickening or distension. REPRODUCTIVE: Reproductive organs are unremarkable. PERITONEUM AND RETROPERITONEUM: No ascites or free air. LYMPH NODES: No lymphadenopathy. BONES AND SOFT TISSUES: No acute abnormality of the bones. No acute soft tissue abnormality. IMPRESSION: 1. No CT evidence of active gastrointestinal hemorrage. 2. Short segment mid transverse colon irregular wall thickening, concerning for underlying malignancy. Recommend colonoscopy. 3. Rectal prolapse with chronic associated rectal wall thickening. 4. Colonic diverticulosis with no acute diverticulitis . 5. Severe atherosclerotic plaque in the aorta and its main branches, with at least moderate narrowing of the origin of the superior mesenteric artery. 6. Other, non-acute and/or normal findings as above. 7. Severe atherosclerotic plaque . Patient celiac stent. Electronically signed by: Morgane Naveau MD 06/01/2024 07:13 PM EST RP Workstation: [id number]   DG Chest Portable 1 View Result Date: 06/01/2024 CLINICAL DATA:  Shortness of breath. EXAM: PORTABLE CHEST 1 VIEW  COMPARISON:  Chest radiograph dated 12/31/2023. FINDINGS: No focal consolidation, pleural effusion, or pneumothorax. The cardiac silhouette is within normal limits. Atherosclerotic calcification of the aorta. No acute osseous pathology. IMPRESSION: No active disease. Electronically Signed   By: Vanetta Chou M.D.   On: 06/01/2024 17:31     TODAY-DAY OF DISCHARGE:  Subjective:   Jelesa Bialas today has no headache,no chest abdominal pain,no new weakness tingling or numbness, feels much better wants to go home today.   Objective:   Blood pressure (!) 125/94,  pulse (!) 102, temperature 98.4 F (36.9 C), temperature source Oral, resp. rate 18, height 5' 1 (1.549 m), weight 73.1 kg, SpO2 95%.  Intake/Output Summary (Last 24 hours) at 06/05/2024 0929 Last data filed at 06/05/2024 0634 Gross per 24 hour  Intake --  Output 1200 ml  Net -1200 ml   Filed Weights   06/02/24 0050 06/03/24 0500 06/04/24 0500  Weight: 74.1 kg 73.2 kg 73.1 kg    Exam: Awake Alert, Oriented *3, No new F.N deficits, Normal affect [place].AT,PERRAL Supple Neck,No JVD, No cervical lymphadenopathy appriciated.  Symmetrical Chest wall movement, Good air movement bilaterally, CTAB RRR,No Gallops,Rubs or new Murmurs, No Parasternal Heave +ve B.Sounds, Abd Soft, Non tender, No organomegaly appriciated, No rebound -guarding or rigidity. No Cyanosis, Clubbing or edema, No new Rash or bruise   PERTINENT RADIOLOGIC STUDIES: No results found.   PERTINENT LAB RESULTS: CBC: Recent Labs    06/04/24 0408 06/05/24 0210  WBC 8.7 9.4  HGB 9.5* 9.1*  HCT 30.7* 29.9*  PLT 310 288   CMET CMP     Component Value Date/Time   NA 140 06/04/2024 0408   K 3.9 06/04/2024 0408   CL 100 06/04/2024 0408   CO2 31 06/04/2024 0408   GLUCOSE 92 06/04/2024 0408   BUN 15 06/04/2024 0408   CREATININE 0.87 06/04/2024 0408   CALCIUM  8.9 06/04/2024 0408   PROT 7.2 06/01/2024 1630   ALBUMIN 4.0 06/01/2024 1630   AST 26 06/01/2024 1630   ALT 13 06/01/2024 1630   ALKPHOS 109 06/01/2024 1630   BILITOT 0.5 06/01/2024 2120   GFRNONAA >60 06/04/2024 0408    GFR Estimated Creatinine Clearance: 41.6 mL/min (by C-G formula based on SCr of 0.87 mg/dL). No results for input(s): LIPASE, AMYLASE in the last 72 hours. No results for input(s): CKTOTAL, CKMB, CKMBINDEX, TROPONINI in the last 72 hours. Invalid input(s): POCBNP No results for input(s): DDIMER in the last 72 hours. No results for input(s): HGBA1C in the last 72 hours. No results for input(s): CHOL, HDL,  LDLCALC, TRIG, CHOLHDL, LDLDIRECT in the last 72 hours. No results for input(s): TSH, T4TOTAL, T3FREE, THYROIDAB in the last 72 hours.  Invalid input(s): FREET3 Recent Labs    06/02/24 0956  VITAMINB12 1,124*  FOLATE 14.3  FERRITIN 29  TIBC 455*  IRON 20*  RETICCTPCT 1.6   Coags: No results for input(s): INR in the last 72 hours.  Invalid input(s): PT Microbiology: Recent Results (from the past 240 hours)  Resp panel by RT-PCR (RSV, Flu A&B, Covid) Anterior Nasal Swab     Status: None   Collection Time: 06/01/24  4:20 PM   Specimen: Anterior Nasal Swab  Result Value Ref Range Status   SARS Coronavirus 2 by RT PCR NEGATIVE NEGATIVE Final   Influenza A by PCR NEGATIVE NEGATIVE Final   Influenza B by PCR NEGATIVE NEGATIVE Final    Comment: (NOTE) The Xpert Xpress SARS-CoV-2/FLU/RSV plus assay is intended as an aid  in the diagnosis of influenza from Nasopharyngeal swab specimens and should not be used as a sole basis for treatment. Nasal washings and aspirates are unacceptable for Xpert Xpress SARS-CoV-2/FLU/RSV testing.  Fact Sheet for Patients: bloggercourse.com  Fact Sheet for Healthcare Providers: seriousbroker.it  This test is not yet approved or cleared by the [place]  FDA and has been authorized for detection and/or diagnosis of SARS-CoV-2 by FDA under an Emergency Use Authorization (EUA). This EUA will remain in effect (meaning this test can be used) for the duration of the COVID-19 declaration under Section 564(b)(1) of the Act, 21 U.S.C. section 360bbb-3(b)(1), unless the authorization is terminated or revoked.     Resp Syncytial Virus by PCR NEGATIVE NEGATIVE Final    Comment: (NOTE) Fact Sheet for Patients: bloggercourse.com  Fact Sheet for Healthcare Providers: seriousbroker.it  This test is not yet approved or cleared by the  [place]  FDA and has been authorized for detection and/or diagnosis of SARS-CoV-2 by FDA under an Emergency Use Authorization (EUA). This EUA will remain in effect (meaning this test can be used) for the duration of the COVID-19 declaration under Section 564(b)(1) of the Act, 21 U.S.C. section 360bbb-3(b)(1), unless the authorization is terminated or revoked.  Performed at [facility], [address], [place], [place] [address]   MRSA Next Gen by PCR, Nasal     Status: None   Collection Time: 06/02/24  2:00 AM   Specimen: Nasal Mucosa; Nasal Swab  Result Value Ref Range Status   MRSA by PCR Next Gen NOT DETECTED NOT DETECTED Final    Comment: (NOTE) The GeneXpert MRSA Assay (FDA approved for NASAL specimens only), is one component of a comprehensive MRSA colonization surveillance program. It is not intended to diagnose MRSA infection nor to guide or monitor treatment for MRSA infections. Test performance is not FDA approved in patients less than 61 years old. Performed at [facility], [address], [place], [place] [address]     FURTHER DISCHARGE INSTRUCTIONS:  Get Medicines reviewed and adjusted: Please take all your medications with you for your next visit with your Primary MD  Laboratory/radiological data: Please request your Primary MD to go over all hospital tests and procedure/radiological results at the follow up, please ask your Primary MD to get all Hospital records sent to his/her office.  In some cases, they will be blood work, cultures and biopsy results pending at the time of your discharge. Please request that your primary care M.D. goes through all the records of your hospital data and follows up on these results.  Also Note the following: If you experience worsening of your admission symptoms, develop shortness of breath, life threatening emergency, suicidal or homicidal thoughts you must seek medical attention immediately by calling 911 or  calling your MD immediately  if symptoms less severe.  You must read complete instructions/literature along with all the possible adverse reactions/side effects for all the Medicines you take and that have been prescribed to you. Take any new Medicines after you have completely understood and accpet all the possible adverse reactions/side effects.   Do not drive when taking Pain medications or sleeping medications (Benzodaizepines)  Do not take more than prescribed Pain, Sleep and Anxiety Medications. It is not advisable to combine anxiety,sleep and pain medications without talking with your primary care practitioner  Special Instructions: If you have smoked or chewed Tobacco  in the last 2 yrs please stop smoking, stop any regular Alcohol  and or  any Recreational drug use.  Wear Seat belts while driving.  Please note: You were cared for by a hospitalist during your hospital stay. Once you are discharged, your primary care physician will handle any further medical issues. Please note that NO REFILLS for any discharge medications will be authorized once you are discharged, as it is imperative that you return to your primary care physician (or establish a relationship with a primary care physician if you do not have one) for your post hospital discharge needs so that they can reassess your need for medications and monitor your lab values.  Total Time spent coordinating discharge including counseling, education and face to face time equals greater than 30 minutes.  Signed: Donalda Applebaum 06/05/2024 9:29 AM      [1]  Allergies Allergen Reactions   Penicillins Anaphylaxis, Swelling and Other (See Comments)   Penicillin G Hives   Penicillins Other (See Comments)    Tolerated zosyn  12/31/2023   "

## 2024-06-05 NOTE — [place] FL2 (Signed)
 " [place]  MEDICAID FL2 LEVEL OF CARE FORM     IDENTIFICATION  Patient Name: Amy Mckay Birthdate: Jul 24, 1934 Sex: female Admission Date (Current Location): 06/01/2024  [facility] and [place] Number:  Producer, Television/film/video and Address:  [facility] [place], [address], [place], [place] [address]      Provider Number: [id number]  Attending Physician Name and Address:  Raenelle Donalda HERO, MD  Relative Name and Phone Number:       Current Level of Care: Hospital Recommended Level of Care: Skilled Nursing Facility Prior Approval Number:    Date Approved/Denied:   PASRR Number:    Discharge Plan: SNF    Current Diagnoses: Patient Active Problem List   Diagnosis Date Noted   Palliative care encounter 06/04/2024   Rectal prolapse 06/02/2024   Gastrointestinal hemorrhage associated with intestinal diverticulosis 06/02/2024   Chronic systolic heart failure (HCC) 06/02/2024   Hemorrhagic shock (HCC) 06/01/2024   Septic shock (HCC) 12/31/2023   NSTEMI (non-ST elevated myocardial infarction) (HCC) 06/13/2023   Respiratory failure, acute (HCC) 06/13/2023   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/28/2023   History of CVA (cerebrovascular accident) 11/03/2022   Heme positive stool 10/17/2021   Esophageal dysphagia 10/14/2021   Acute toxic encephalopathy 10/14/2021   Hypotension 10/14/2021   Near syncope 10/13/2021   Iron deficiency anemia due to chronic blood loss 10/13/2021   Chronic respiratory failure with hypoxia (HCC) 10/13/2021   Chronic lower back pain 10/13/2021   Tobacco use disorder 10/13/2021   Pulmonary nodules/lesions, multiple 10/13/2021   Acute on chronic respiratory failure with hypoxia (HCC) 02/02/2021   COVID-19 virus infection 02/01/2021   AKI (acute kidney injury) 02/01/2021   Peripheral arterial disease 02/01/2021   Benign essential HTN 02/01/2021   COPD (chronic obstructive pulmonary disease) (HCC) 02/01/2021   Cigarette smoker 02/01/2021    Hypothyroidism 02/01/2021   Memory impairment 02/01/2021   Restless leg 02/01/2021   Chronic hyponatremia 02/01/2021   Colitis 01/29/2021    Orientation RESPIRATION BLADDER Height & Weight     Self, Time, Situation, Place  O2 (3L Nasal cannula) Incontinent, External catheter Weight: 161 lb 2.5 oz (73.1 kg) Height:  5' 1 (154.9 cm)  BEHAVIORAL SYMPTOMS/MOOD NEUROLOGICAL BOWEL NUTRITION STATUS      Incontinent Diet (See dc summary)  AMBULATORY STATUS COMMUNICATION OF NEEDS Skin   Supervision Verbally Normal                       Personal Care Assistance Level of Assistance  Bathing, Feeding, Dressing Bathing Assistance: Limited assistance Feeding assistance: Limited assistance Dressing Assistance: Limited assistance     Functional Limitations Info  Sight, Hearing Sight Info: Impaired Hearing Info: Impaired      SPECIAL CARE FACTORS FREQUENCY                       Contractures Contractures Info: Not present    Additional Factors Info  Code Status, Allergies Code Status Info: DNR-limited Allergies Info: Penicillins, Penicillin G, Penicillins           Current Medications (06/05/2024):  This is the current hospital active medication list Current Facility-Administered Medications  Medication Dose Route Frequency Provider Last Rate Last Admin   acetaminophen  (TYLENOL ) tablet 1,000 mg  1,000 mg Oral Q8H Wilson, Tara N, PA-C   1,000 mg at 06/05/24 9359   atorvastatin  (LIPITOR ) tablet 80 mg  80 mg Oral QHS Wilson, Tara N, PA-C   80 mg at 06/04/24  2103   budesonide -glycopyrrolate -formoterol  (BREZTRI ) 160-9-4.8 MCG/ACT inhaler 2 puff  2 puff Inhalation BID Wilson, Tara N, PA-C   2 puff at 06/05/24 9147   Chlorhexidine  Gluconate Cloth 2 % PADS 6 each  6 each Topical Daily Maree Harder, MD   6 each at 06/05/24 9147   DULoxetine  (CYMBALTA ) DR capsule 60 mg  60 mg Oral Daily Wilson, Tara N, PA-C   60 mg at 06/05/24 0851   fluticasone  (FLONASE ) 50 MCG/ACT nasal spray  1 spray  1 spray Each Nare Daily Wilson, Tara N, PA-C   1 spray at 06/05/24 9147   gabapentin  (NEURONTIN ) capsule 300 mg  300 mg Oral TID Raenelle Donalda HERO, MD   300 mg at 06/05/24 9148   HYDROmorphone  (DILAUDID ) injection 0.5 mg  0.5 mg Intravenous Q4H PRN Wilson, Tara N, PA-C   0.5 mg at 06/05/24 9149   insulin  aspart (novoLOG ) injection 0-9 Units  0-9 Units Subcutaneous Q4H Maree Harder, MD   1 Units at 06/03/24 2100   ipratropium-albuterol  (DUONEB) 0.5-2.5 (3) MG/3ML nebulizer solution 3 mL  3 mL Nebulization Q6H PRN Wilson, Tara N, PA-C       levothyroxine  (SYNTHROID ) tablet 75 mcg  75 mcg Oral QAC breakfast Wilson, Tara N, PA-C   75 mcg at 06/05/24 9359   memantine  (NAMENDA ) tablet 10 mg  10 mg Oral BID Wilson, Tara N, PA-C   10 mg at 06/05/24 9148   metoprolol  succinate (TOPROL -XL) 24 hr tablet 25 mg  25 mg Oral Daily Al-Sultani, Anmar, MD   25 mg at 06/05/24 0851   ondansetron  (ZOFRAN ) injection 4 mg  4 mg Intravenous Q6H PRN Maree Harder, MD   4 mg at 06/02/24 2303   Oral care mouth rinse  15 mL Mouth Rinse PRN Maree Harder, MD       oxyCODONE  (Oxy IR/ROXICODONE ) immediate release tablet 10 mg  10 mg Oral Q4H PRN Wilson, Tara N, PA-C   10 mg at 06/05/24 9359   pantoprazole  (PROTONIX ) injection 40 mg  40 mg Intravenous Q12H Paterson, Robert C, MD   40 mg at 06/05/24 9148   pramipexole  (MIRAPEX ) tablet 0.25 mg  0.25 mg Oral BID Wilson, Tara N, PA-C   0.25 mg at 06/04/24 2116     Discharge Medications: Please see discharge summary for a list of discharge medications.  Relevant Imaging Results:  Relevant Lab Results:   Additional Information SSN: [id number]. Hospice to follow at SNF.  Inocente GORMAN Kindle, LCSW     "

## 2024-06-05 NOTE — TOC Transition Note (Signed)
 Transition of Care [facility]) - Discharge Note   Patient Details  Name: Amy Mckay MRN: [id number] Date of Birth: [birth year]  [facility]) CM/SW Contact:  Inocente GORMAN Kindle, LCSW Phone Number: 06/05/2024, 1:15 PM   Clinical Narrative:    Patient will DC to: [facility] Anticipated DC date: 06/05/24 Family notified: Daughter, [name] Transport by: [facility]   Per MD patient ready for DC to [place]. RN to call report prior to discharge [phone] room 304A). RN, patient, patient's family, and facility notified of DC. Discharge Summary and FL2 sent to facility. DC packet on chart including signed DNR and scripts. Ambulance transport requested for patient.   CSW will sign off for now as social work intervention is no longer needed. Please consult us  again if new needs arise.     Final next level of care: Skilled Nursing Facility Barriers to Discharge: Barriers Resolved   Patient Goals and CMS Choice Patient states their goals for this hospitalization and ongoing recovery are:: Return to SNF CMS Medicare.gov Compare Post Acute Care list provided to:: Patient Choice offered to / list presented to : Patient      Discharge Placement   Existing PASRR number confirmed : 06/05/24          Patient [facility] bed at: [facility] Patient to be transferred to facility by: [facility] Name of family member notified: Daughter Patient and family notified of of transfer: 06/05/24  Discharge Plan and Services Additional resources added to the After Visit Summary for   In-house Referral: Clinical Social Work   Post Acute Care Choice: Skilled Nursing Facility                               Social Drivers of Health (SDOH) Interventions SDOH Screenings   Food Insecurity: No Food Insecurity (06/03/2024)  Housing: Low Risk (06/03/2024)  Transportation Needs: No Transportation Needs (06/03/2024)  Utilities: Not At Risk (06/03/2024)  Social Connections: Socially Isolated  (06/03/2024)  Tobacco Use: Medium Risk (06/03/2024)     Readmission Risk Interventions     No data to display

## 2024-06-05 NOTE — Progress Notes (Signed)
 Called [facility] to give report x 2. Call not answered

## 2024-06-05 NOTE — TOC Progression Note (Addendum)
 Transition of Care [facility]) - Progression Note    Patient Details  Name: Amy Mckay MRN: [id number] Date of Birth: 19-Dec-1934  Transition of Care [facility]) CM/SW Contact  Inocente GORMAN Kindle, LCSW Phone Number: 06/05/2024, 8:59 AM  Clinical Narrative:    8:59 AM-Patient stable for discharge back to [facility] today. Awaiting confirmation from the facility and will add Hospice services there per MD.   9:43 AM-[facility] able to accept patient back today; requested RN call report in 20 minutes. CSW updated patient's daughter isidor requested Orysia ensure that patient receives her pain medications on time. CSW requested SNF follow up. Daughter would like to add [facility] as patient had them prior. CSW sent referral to [facility] with [facility]. Daughter requested [facility] for transport.   11:50 AM-Per [facility], they are not in contract with [facility] so will need to use [facility]. CSW sent referral to [facility] for follow up and left voicemail for daughter letting her know.   Expected Discharge Plan: Skilled Nursing Facility Barriers to Discharge: Barriers Resolved               Expected Discharge Plan and Services In-house Referral: Clinical Social Work   Post Acute Care Choice: Skilled Nursing Facility Living arrangements for the past 2 months: Skilled Nursing Facility                                       Social Drivers of Health (SDOH) Interventions SDOH Screenings   Food Insecurity: No Food Insecurity (06/03/2024)  Housing: Low Risk (06/03/2024)  Transportation Needs: No Transportation Needs (06/03/2024)  Utilities: Not At Risk (06/03/2024)  Social Connections: Socially Isolated (06/03/2024)  Tobacco Use: Medium Risk (06/03/2024)    Readmission Risk Interventions     No data to display

## 2024-06-05 NOTE — Progress Notes (Signed)
 AVS completed for discharge packet and placed with chart.

## 2024-06-05 NOTE — Progress Notes (Signed)
 OT Cancellation Note  Patient Details Name: Saryiah Bencosme MRN: [id number] DOB: 1934/08/12   Cancelled Treatment:    Reason Eval/Treat Not Completed: Other (comment).  Discharging to SNF on comfort care via hospice.  Will defer to OT at next level of care.    Ariane Ditullio D Antionne Enrique 06/05/2024, 2:02 PM 06/05/2024  RP, OTR/L  Acute Rehabilitation Services  Office:  [phone]

## 2024-06-06 NOTE — Care Management Important Message (Signed)
 Important Message  Patient Details  Name: Amy Mckay MRN: [id number] Date of Birth: [birth year]   Important Message Given:  Yes - Medicare IM     Claretta Deed 06/06/2024, 1:00 PM
